# Patient Record
Sex: Male | Born: 1945 | Race: Black or African American | Hispanic: No | Marital: Single | Smoking: Former smoker
Health system: Southern US, Community
[De-identification: ages and names within clinical notes are randomized; demographics above are authoritative.]

## PROBLEM LIST (undated history)

## (undated) DIAGNOSIS — I255 Ischemic cardiomyopathy: Secondary | ICD-10-CM

## (undated) DIAGNOSIS — F1010 Alcohol abuse, uncomplicated: Secondary | ICD-10-CM

## (undated) DIAGNOSIS — I5021 Acute systolic (congestive) heart failure: Secondary | ICD-10-CM

## (undated) DIAGNOSIS — I10 Essential (primary) hypertension: Secondary | ICD-10-CM

## (undated) DIAGNOSIS — A419 Sepsis, unspecified organism: Secondary | ICD-10-CM

## (undated) DIAGNOSIS — Z87891 Personal history of nicotine dependence: Secondary | ICD-10-CM

## (undated) DIAGNOSIS — L03119 Cellulitis of unspecified part of limb: Secondary | ICD-10-CM

## (undated) DIAGNOSIS — I714 Abdominal aortic aneurysm, without rupture: Secondary | ICD-10-CM

## (undated) DIAGNOSIS — M869 Osteomyelitis, unspecified: Secondary | ICD-10-CM

## (undated) DIAGNOSIS — I70269 Atherosclerosis of native arteries of extremities with gangrene, unspecified extremity: Secondary | ICD-10-CM

## (undated) DIAGNOSIS — I509 Heart failure, unspecified: Secondary | ICD-10-CM

## (undated) DIAGNOSIS — I739 Peripheral vascular disease, unspecified: Secondary | ICD-10-CM

## (undated) DIAGNOSIS — R6521 Severe sepsis with septic shock: Secondary | ICD-10-CM

## (undated) HISTORY — DX: Peripheral vascular disease, unspecified: I73.9

## (undated) HISTORY — PX: ABOVE KNEE LEG AMPUTATION: SUR20

---

## 2013-03-25 ENCOUNTER — Emergency Department (HOSPITAL_COMMUNITY): Payer: Medicare Other

## 2013-03-25 ENCOUNTER — Encounter (HOSPITAL_COMMUNITY): Payer: Self-pay | Admitting: Emergency Medicine

## 2013-03-25 ENCOUNTER — Inpatient Hospital Stay (HOSPITAL_COMMUNITY): Payer: Medicare Other

## 2013-03-25 ENCOUNTER — Inpatient Hospital Stay (HOSPITAL_COMMUNITY)
Admission: EM | Admit: 2013-03-25 | Discharge: 2013-04-07 | DRG: 853 | Disposition: A | Discharge status: Skilled Nursing Facility | Payer: Medicare Other | Attending: Internal Medicine | Admitting: Internal Medicine

## 2013-03-25 DIAGNOSIS — Z87828 Personal history of other (healed) physical injury and trauma: Secondary | ICD-10-CM

## 2013-03-25 DIAGNOSIS — R710 Precipitous drop in hematocrit: Secondary | ICD-10-CM | POA: Diagnosis present

## 2013-03-25 DIAGNOSIS — F1210 Cannabis abuse, uncomplicated: Secondary | ICD-10-CM | POA: Diagnosis present

## 2013-03-25 DIAGNOSIS — I5021 Acute systolic (congestive) heart failure: Secondary | ICD-10-CM

## 2013-03-25 DIAGNOSIS — R195 Other fecal abnormalities: Secondary | ICD-10-CM

## 2013-03-25 DIAGNOSIS — S91309A Unspecified open wound, unspecified foot, initial encounter: Secondary | ICD-10-CM | POA: Diagnosis present

## 2013-03-25 DIAGNOSIS — Z87891 Personal history of nicotine dependence: Secondary | ICD-10-CM

## 2013-03-25 DIAGNOSIS — I998 Other disorder of circulatory system: Secondary | ICD-10-CM | POA: Diagnosis present

## 2013-03-25 DIAGNOSIS — E876 Hypokalemia: Secondary | ICD-10-CM | POA: Diagnosis present

## 2013-03-25 DIAGNOSIS — R0602 Shortness of breath: Secondary | ICD-10-CM

## 2013-03-25 DIAGNOSIS — I428 Other cardiomyopathies: Secondary | ICD-10-CM | POA: Diagnosis present

## 2013-03-25 DIAGNOSIS — F1020 Alcohol dependence, uncomplicated: Secondary | ICD-10-CM | POA: Diagnosis present

## 2013-03-25 DIAGNOSIS — S78119A Complete traumatic amputation at level between unspecified hip and knee, initial encounter: Secondary | ICD-10-CM

## 2013-03-25 DIAGNOSIS — R7881 Bacteremia: Secondary | ICD-10-CM

## 2013-03-25 DIAGNOSIS — J9601 Acute respiratory failure with hypoxia: Secondary | ICD-10-CM

## 2013-03-25 DIAGNOSIS — I509 Heart failure, unspecified: Secondary | ICD-10-CM

## 2013-03-25 DIAGNOSIS — I079 Rheumatic tricuspid valve disease, unspecified: Secondary | ICD-10-CM | POA: Diagnosis present

## 2013-03-25 DIAGNOSIS — S91301A Unspecified open wound, right foot, initial encounter: Secondary | ICD-10-CM

## 2013-03-25 DIAGNOSIS — Z7982 Long term (current) use of aspirin: Secondary | ICD-10-CM

## 2013-03-25 DIAGNOSIS — E43 Unspecified severe protein-calorie malnutrition: Secondary | ICD-10-CM

## 2013-03-25 DIAGNOSIS — R339 Retention of urine, unspecified: Secondary | ICD-10-CM | POA: Diagnosis present

## 2013-03-25 DIAGNOSIS — I10 Essential (primary) hypertension: Secondary | ICD-10-CM

## 2013-03-25 DIAGNOSIS — J9600 Acute respiratory failure, unspecified whether with hypoxia or hypercapnia: Secondary | ICD-10-CM | POA: Diagnosis present

## 2013-03-25 DIAGNOSIS — F1010 Alcohol abuse, uncomplicated: Secondary | ICD-10-CM

## 2013-03-25 DIAGNOSIS — I96 Gangrene, not elsewhere classified: Secondary | ICD-10-CM | POA: Diagnosis present

## 2013-03-25 DIAGNOSIS — R600 Localized edema: Secondary | ICD-10-CM

## 2013-03-25 DIAGNOSIS — I2510 Atherosclerotic heart disease of native coronary artery without angina pectoris: Secondary | ICD-10-CM

## 2013-03-25 DIAGNOSIS — D62 Acute posthemorrhagic anemia: Secondary | ICD-10-CM | POA: Diagnosis not present

## 2013-03-25 DIAGNOSIS — A419 Sepsis, unspecified organism: Principal | ICD-10-CM

## 2013-03-25 DIAGNOSIS — F10231 Alcohol dependence with withdrawal delirium: Secondary | ICD-10-CM | POA: Diagnosis present

## 2013-03-25 DIAGNOSIS — I080 Rheumatic disorders of both mitral and aortic valves: Secondary | ICD-10-CM | POA: Diagnosis present

## 2013-03-25 DIAGNOSIS — D696 Thrombocytopenia, unspecified: Secondary | ICD-10-CM | POA: Diagnosis present

## 2013-03-25 DIAGNOSIS — I739 Peripheral vascular disease, unspecified: Secondary | ICD-10-CM | POA: Diagnosis present

## 2013-03-25 DIAGNOSIS — X58XXXA Exposure to other specified factors, initial encounter: Secondary | ICD-10-CM | POA: Diagnosis present

## 2013-03-25 DIAGNOSIS — F1021 Alcohol dependence, in remission: Secondary | ICD-10-CM

## 2013-03-25 DIAGNOSIS — F10931 Alcohol use, unspecified with withdrawal delirium: Secondary | ICD-10-CM

## 2013-03-25 DIAGNOSIS — F17200 Nicotine dependence, unspecified, uncomplicated: Secondary | ICD-10-CM | POA: Diagnosis present

## 2013-03-25 HISTORY — DX: Personal history of nicotine dependence: Z87.891

## 2013-03-25 HISTORY — DX: Essential (primary) hypertension: I10

## 2013-03-25 HISTORY — DX: Alcohol abuse, uncomplicated: F10.10

## 2013-03-25 HISTORY — DX: Heart failure, unspecified: I50.9

## 2013-03-25 LAB — BLOOD GAS, ARTERIAL
Acid-base deficit: 5.4 mmol/L — ABNORMAL HIGH (ref 0.0–2.0)
Bicarbonate: 17.8 mEq/L — ABNORMAL LOW (ref 20.0–24.0)
Expiratory PAP: 6
O2 Saturation: 99.6 %
pH, Arterial: 7.455 — ABNORMAL HIGH (ref 7.350–7.450)
pO2, Arterial: 359 mmHg — ABNORMAL HIGH (ref 80.0–100.0)

## 2013-03-25 LAB — CBC WITH DIFFERENTIAL/PLATELET
Basophils Absolute: 0 10*3/uL (ref 0.0–0.1)
Basophils Relative: 0 % (ref 0–1)
Eosinophils Absolute: 0 10*3/uL (ref 0.0–0.7)
Lymphocytes Relative: 10 % — ABNORMAL LOW (ref 12–46)
MCH: 29.8 pg (ref 26.0–34.0)
MCHC: 33.9 g/dL (ref 30.0–36.0)
Neutro Abs: 8.1 10*3/uL — ABNORMAL HIGH (ref 1.7–7.7)
Neutrophils Relative %: 85 % — ABNORMAL HIGH (ref 43–77)
Platelets: 157 10*3/uL (ref 150–400)
RBC: 5.03 MIL/uL (ref 4.22–5.81)
RDW: 17.9 % — ABNORMAL HIGH (ref 11.5–15.5)

## 2013-03-25 LAB — COMPREHENSIVE METABOLIC PANEL
AST: 52 U/L — ABNORMAL HIGH (ref 0–37)
Albumin: 3 g/dL — ABNORMAL LOW (ref 3.5–5.2)
Alkaline Phosphatase: 162 U/L — ABNORMAL HIGH (ref 39–117)
BUN: 18 mg/dL (ref 6–23)
Chloride: 100 mEq/L (ref 96–112)
Creatinine, Ser: 0.64 mg/dL (ref 0.50–1.35)
Potassium: 3.9 mEq/L (ref 3.5–5.1)
Sodium: 140 mEq/L (ref 135–145)
Total Bilirubin: 2.2 mg/dL — ABNORMAL HIGH (ref 0.3–1.2)
Total Protein: 8.1 g/dL (ref 6.0–8.3)

## 2013-03-25 LAB — TROPONIN I
Troponin I: <0.3 ng/mL (ref <0.30)
Troponin I: <0.3 ng/mL (ref <0.30)
Troponin I: <0.3 ng/mL (ref <0.30)

## 2013-03-25 LAB — PRO B NATRIURETIC PEPTIDE: Pro B Natriuretic peptide (BNP): 14339 pg/mL — ABNORMAL HIGH (ref 0–125)

## 2013-03-25 MED ORDER — CARVEDILOL 3.125 MG PO TABS
3.1250 mg | ORAL_TABLET | Freq: Two times a day (BID) | ORAL | Status: DC
  Filled 2013-03-25: qty 1

## 2013-03-25 MED ORDER — VITAMIN B-1 100 MG PO TABS
100.0000 mg | ORAL_TABLET | Freq: Every day | ORAL | Status: DC
  Administered 2013-03-25: 100 mg via ORAL
  Filled 2013-03-25: qty 1

## 2013-03-25 MED ORDER — SODIUM CHLORIDE 0.9 % IJ SOLN: 3.0000 mL | INTRAMUSCULAR | Status: DC | PRN

## 2013-03-25 MED ORDER — VANCOMYCIN HCL IN DEXTROSE 1-5 GM/200ML-% IV SOLN
1000.0000 mg | Freq: Two times a day (BID) | INTRAVENOUS | Status: DC
  Administered 2013-03-25 – 2013-03-26 (×2): 1000 mg via INTRAVENOUS
  Filled 2013-03-25 (×4): qty 200

## 2013-03-25 MED ORDER — PIPERACILLIN-TAZOBACTAM 3.375 G IVPB
INTRAVENOUS | Status: AC
  Filled 2013-03-25: qty 50

## 2013-03-25 MED ORDER — HYDROCODONE-ACETAMINOPHEN 5-325 MG PO TABS: 1.0000 | ORAL_TABLET | ORAL | Status: DC | PRN

## 2013-03-25 MED ORDER — SODIUM CHLORIDE 0.9 % IJ SOLN
3.0000 mL | Freq: Two times a day (BID) | INTRAMUSCULAR | Status: DC
  Administered 2013-03-27 – 2013-03-29 (×2): 3 mL via INTRAVENOUS
  Administered 2013-03-29: 10:00:00 via INTRAVENOUS

## 2013-03-25 MED ORDER — ACETAMINOPHEN 650 MG RE SUPP: 650.0000 mg | Freq: Four times a day (QID) | RECTAL | Status: DC | PRN

## 2013-03-25 MED ORDER — ASPIRIN 81 MG PO CHEW
81.0000 mg | CHEWABLE_TABLET | Freq: Every day | ORAL | Status: DC
  Administered 2013-03-25 – 2013-04-07 (×14): 81 mg via ORAL
  Filled 2013-03-25 (×13): qty 1

## 2013-03-25 MED ORDER — TAMSULOSIN HCL 0.4 MG PO CAPS
0.4000 mg | ORAL_CAPSULE | Freq: Every day | ORAL | Status: DC
  Administered 2013-03-25: 0.4 mg via ORAL
  Filled 2013-03-25: qty 1

## 2013-03-25 MED ORDER — DOXYCYCLINE HYCLATE 100 MG IV SOLR
100.0000 mg | Freq: Two times a day (BID) | INTRAVENOUS | Status: DC
  Administered 2013-03-25 – 2013-03-26 (×2): 100 mg via INTRAVENOUS
  Filled 2013-03-25 (×4): qty 100

## 2013-03-25 MED ORDER — VANCOMYCIN HCL IN DEXTROSE 1-5 GM/200ML-% IV SOLN
INTRAVENOUS | Status: AC
  Filled 2013-03-25: qty 200

## 2013-03-25 MED ORDER — ENOXAPARIN SODIUM 40 MG/0.4ML ~~LOC~~ SOLN
40.0000 mg | SUBCUTANEOUS | Status: DC
  Filled 2013-03-25: qty 0.4

## 2013-03-25 MED ORDER — FUROSEMIDE 10 MG/ML IJ SOLN: 40.0000 mg | Freq: Two times a day (BID) | INTRAMUSCULAR | Status: DC

## 2013-03-25 MED ORDER — ONDANSETRON HCL 4 MG PO TABS: 4.0000 mg | ORAL_TABLET | Freq: Four times a day (QID) | ORAL | Status: DC | PRN

## 2013-03-25 MED ORDER — SODIUM CHLORIDE 0.9 % IJ SOLN
3.0000 mL | Freq: Two times a day (BID) | INTRAMUSCULAR | Status: DC
  Administered 2013-03-28 – 2013-04-06 (×4): 3 mL via INTRAVENOUS

## 2013-03-25 MED ORDER — THIAMINE HCL 100 MG/ML IJ SOLN
100.0000 mg | Freq: Every day | INTRAMUSCULAR | Status: DC
  Administered 2013-03-26 – 2013-03-27 (×2): 100 mg via INTRAVENOUS
  Filled 2013-03-25 (×3): qty 1

## 2013-03-25 MED ORDER — FUROSEMIDE 10 MG/ML IJ SOLN
40.0000 mg | Freq: Once | INTRAMUSCULAR | Status: AC
  Administered 2013-03-25: 40 mg via INTRAVENOUS
  Filled 2013-03-25: qty 4

## 2013-03-25 MED ORDER — SODIUM CHLORIDE 0.9 % IV BOLUS (SEPSIS)
250.0000 mL | Freq: Once | INTRAVENOUS | Status: AC
  Administered 2013-03-25: 250 mL via INTRAVENOUS

## 2013-03-25 MED ORDER — PIPERACILLIN-TAZOBACTAM 3.375 G IVPB
3.3750 g | Freq: Three times a day (TID) | INTRAVENOUS | Status: DC
  Administered 2013-03-26 – 2013-03-31 (×17): 3.375 g via INTRAVENOUS
  Filled 2013-03-25 (×20): qty 50

## 2013-03-25 MED ORDER — ALUM & MAG HYDROXIDE-SIMETH 200-200-20 MG/5ML PO SUSP: 30.0000 mL | Freq: Four times a day (QID) | ORAL | Status: DC | PRN

## 2013-03-25 MED ORDER — FUROSEMIDE 10 MG/ML IJ SOLN
40.0000 mg | Freq: Two times a day (BID) | INTRAMUSCULAR | Status: DC
  Filled 2013-03-25: qty 4

## 2013-03-25 MED ORDER — HYDROMORPHONE HCL PF 1 MG/ML IJ SOLN: 0.5000 mg | INTRAMUSCULAR | Status: DC | PRN

## 2013-03-25 MED ORDER — FOLIC ACID 1 MG PO TABS
1.0000 mg | ORAL_TABLET | Freq: Every day | ORAL | Status: DC
  Administered 2013-03-25 – 2013-04-07 (×14): 1 mg via ORAL
  Filled 2013-03-25 (×15): qty 1

## 2013-03-25 MED ORDER — LORAZEPAM 0.5 MG PO TABS: 1.0000 mg | ORAL_TABLET | Freq: Four times a day (QID) | ORAL | Status: DC | PRN

## 2013-03-25 MED ORDER — LORAZEPAM 2 MG/ML IJ SOLN: 1.0000 mg | Freq: Four times a day (QID) | INTRAMUSCULAR | Status: DC | PRN

## 2013-03-25 MED ORDER — ADULT MULTIVITAMIN W/MINERALS CH
1.0000 | ORAL_TABLET | Freq: Every day | ORAL | Status: DC
  Administered 2013-03-25: 1 via ORAL
  Filled 2013-03-25 (×2): qty 1

## 2013-03-25 MED ORDER — SODIUM CHLORIDE 0.9 % IV SOLN: 250.0000 mL | INTRAVENOUS | Status: DC | PRN

## 2013-03-25 MED ORDER — CARVEDILOL 6.25 MG PO TABS
6.2500 mg | ORAL_TABLET | Freq: Two times a day (BID) | ORAL | Status: DC
  Administered 2013-03-25: 6.25 mg via ORAL
  Filled 2013-03-25 (×2): qty 1
  Filled 2013-03-25: qty 2
  Filled 2013-03-25: qty 1

## 2013-03-25 MED ORDER — CHLORDIAZEPOXIDE HCL 5 MG PO CAPS
10.0000 mg | ORAL_CAPSULE | Freq: Three times a day (TID) | ORAL | Status: DC
  Administered 2013-03-25 – 2013-03-31 (×18): 10 mg via ORAL
  Filled 2013-03-25 (×18): qty 2

## 2013-03-25 MED ORDER — ACETAMINOPHEN 325 MG PO TABS: 650.0000 mg | ORAL_TABLET | Freq: Four times a day (QID) | ORAL | Status: DC | PRN

## 2013-03-25 MED ORDER — ONDANSETRON HCL 4 MG/2ML IJ SOLN: 4.0000 mg | Freq: Four times a day (QID) | INTRAMUSCULAR | Status: DC | PRN

## 2013-03-25 MED ORDER — NITROGLYCERIN 2 % TD OINT
0.5000 [in_us] | TOPICAL_OINTMENT | Freq: Four times a day (QID) | TRANSDERMAL | Status: DC
  Administered 2013-03-25: 0.5 [in_us] via TOPICAL
  Filled 2013-03-25: qty 1

## 2013-03-25 NOTE — ED Notes (Signed)
Pt smells of kerosine, family and friends brought pt here and states the pt stays drunk most of the time, has had increasing difficulty ambulating and has co SOB without chest pain. Pt states he is fine and does not want to be in hospital.

## 2013-03-25 NOTE — Progress Notes (Addendum)
Called by nurse to evaluate the patient for lethargy, hypotension and worsening shortness of breath. Patient was admitted earlier today with acute on chronic nonspecific CHF, 2-D echo is pending. Patient was started on IV Lasix, nitro paste 15 mg every 6 hours, and also started on Flomax for possible BPH. On exam- patient is lethargic, though arousable and able to answer questions appropriately. Patient cooperative with exam Lungs- bilateral crackles Foot- ischemic foot with gangrene  Assessment/plan  Hypotension-patient's blood pressure was found to be 82/48 manually,  patient could be developing sepsis from the ischemic foot with gangrene, will start him on vancomycin and Zosyn. We'll obtain stat CBC, lactic acid. I will discontinue Nitropaste, Flomax at this time. Will hold the other antihypertensives for systolic blood pressure less than 90.  We'll give him a bolus of 250 normal saline, as he is in CHF exacerbation. We'll transfer the patient to step down and monitor closely. If blood pressure doesn't improve we'll have to start him on pressors support. We'll also obtain EKG, cardiac enzymes time 3.  Acute CHF exacerbation - at this time due to low blood pressure , we'll not give Lasix . Patient will be transferred to step down and started on BiPAP.

## 2013-03-25 NOTE — Progress Notes (Signed)
Patient's ABG results were good. Hand delivered results to Dr. Sharl Ma. Patient taken off BIPAP for now and placed on 2 lpm nasal cannula.

## 2013-03-25 NOTE — ED Notes (Signed)
Pt unable to stand for DG 2 view x-ray.

## 2013-03-25 NOTE — ED Notes (Signed)
Pt reports being sob for the last month, worse in the last week, unable to walk at times. Denies any fever or cp, no cough.

## 2013-03-25 NOTE — ED Provider Notes (Signed)
CSN: [id number]     Arrival date & time 03/25/13  1134 History   This chart was scribed for Benny Lennert, MD, by Yevette Edwards, ED Scribe. This patient was seen in room APA02/APA02 and the patient's care was started at 12:22 PM.  None    Chief Complaint  Patient presents with  . Shortness of Breath    Patient is a 67 y.o. male presenting with shortness of breath. The history is provided by a relative and the patient. No language interpreter was used.  Shortness of Breath Onset quality:  Gradual Duration:  4 weeks Associated symptoms: no chest pain, no cough and no fever   Risk factors: alcohol use and tobacco use    HPI Comments: Rodney [name] is a 67 y.o. male, with a h/o alcohol abuse, who presents to the Emergency Department complaining of SOB which has been gradually-worsening for approximately a month. He reports ambulation increases his SOB.  His cousin reports the pt has experienced increased fatigue, increased generalized weakness, and increased swelling to his lower extremities and his face which has also been present for a month. The pt denies any fever, chest pain, or cough. The pt usually drinks liquor and beer on a daily basis, and he reports a h/o minimal medical care. He is a current smoker.   History reviewed. No pertinent past medical history. History reviewed. No pertinent past surgical history. No family history on file. History  Substance Use Topics  . Smoking status: Current Every Day Smoker    Types: Cigarettes  . Smokeless tobacco: Not on file  . Alcohol Use: Yes     Comment: everyday    Review of Systems  Constitutional: Positive for fatigue. Negative for fever.  Respiratory: Positive for shortness of breath. Negative for cough.   Cardiovascular: Positive for leg swelling. Negative for chest pain.  Neurological: Positive for weakness.  All other systems reviewed and are negative.   Allergies  Review of patient's allergies indicates not on  file.  Home Medications  No current outpatient prescriptions on file.  Triage Vitals: BP 149/68  Pulse 85  Temp(Src) 98.3 F (36.8 C) (Oral)  Resp 18  Ht 5\' 8"  (1.727 m)  Wt 180 lb (81.647 kg)  BMI 27.38 kg/m2  SpO2 100%  Physical Exam  Nursing note and vitals reviewed. Constitutional: He is oriented to person, place, and time. He appears well-developed and well-nourished. No distress.  HENT:  Head: Normocephalic and atraumatic.  Edema present.   Eyes: Conjunctivae and EOM are normal. No scleral icterus.  Neck: Neck supple. No tracheal deviation present. No thyromegaly present.  Cardiovascular: Normal rate, regular rhythm and intact distal pulses.  Exam reveals no gallop and no friction rub.   No murmur heard. Pulmonary/Chest: Effort normal. No stridor. No respiratory distress. He has no wheezes. He has no rales. He exhibits no tenderness.  Mild crackles in bases bilaterally.   Abdominal: He exhibits no distension. There is no tenderness. There is no rebound.  Musculoskeletal: He exhibits edema.  2+ pitting edema. Edema present to face.   Lymphadenopathy:    He has no cervical adenopathy.  Neurological: He is alert and oriented to person, place, and time. He exhibits normal muscle tone. Coordination normal.  Skin: Skin is warm and dry. No rash noted. No erythema.  Psychiatric: He has a normal mood and affect. His behavior is normal.    ED Course  Procedures (including critical care time)  DIAGNOSTIC STUDIES: Oxygen Saturation is 100% on  room air, normal by my interpretation.    COORDINATION OF CARE:  12:28 PM- Discussed treatment plan with patient, and the patient agreed to the plan.   Labs Review Labs Reviewed  CBC WITH DIFFERENTIAL - Abnormal; Notable for the following:    RDW 17.9 (*)    Neutrophils Relative % 85 (*)    Neutro Abs 8.1 (*)    Lymphocytes Relative 10 (*)    All other components within normal limits  COMPREHENSIVE METABOLIC PANEL - Abnormal;  Notable for the following:    Glucose, Bld 119 (*)    Albumin 3.0 (*)    AST 52 (*)    Alkaline Phosphatase 162 (*)    Total Bilirubin 2.2 (*)    All other components within normal limits  PRO B NATRIURETIC PEPTIDE - Abnormal; Notable for the following:    Pro B Natriuretic peptide (BNP) 14339.0 (*)    All other components within normal limits  TROPONIN I   Imaging Review Dg Chest 2 View  03/25/2013   CLINICAL DATA:  Weakness, lower extremity edema, and dyspnea  EXAM: CHEST  2 VIEW  COMPARISON:  None.  FINDINGS: The lungs are adequately inflated. The cardiopericardial silhouette is enlarged. There is a small left pleural effusion blunting the costophrenic angles. The pulmonary vascularity is mildly prominent centrally. Definite cephalization is not demonstrated. The mediastinum is normal in width. The trachea is midline.  IMPRESSION: The findings are consistent with low grade CHF with small left-sided pleural effusion and mild central pulmonary vascular congestion.   Electronically Signed   By: David  [place]   On: 03/25/2013 13:15    EKG Interpretation   None       MDM  chf  The chart was scribed for me under my direct supervision.  I personally performed the history, physical, and medical decision making and all procedures in the evaluation of this patient.Benny Lennert, MD 03/25/13 [phone]
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
## 2013-03-26 ENCOUNTER — Encounter (HOSPITAL_COMMUNITY): Payer: Self-pay | Admitting: Anesthesiology

## 2013-03-26 ENCOUNTER — Inpatient Hospital Stay (HOSPITAL_COMMUNITY): Payer: Medicare Other

## 2013-03-26 ENCOUNTER — Encounter (HOSPITAL_COMMUNITY)
Admission: EM | Disposition: A | Discharge status: Skilled Nursing Facility | Payer: Self-pay | Source: Home / Self Care | Attending: Critical Care Medicine

## 2013-03-26 ENCOUNTER — Encounter (HOSPITAL_COMMUNITY): Payer: Medicare Other | Admitting: Anesthesiology

## 2013-03-26 ENCOUNTER — Inpatient Hospital Stay (HOSPITAL_COMMUNITY): Payer: Medicare Other | Admitting: Anesthesiology

## 2013-03-26 DIAGNOSIS — E43 Unspecified severe protein-calorie malnutrition: Secondary | ICD-10-CM | POA: Diagnosis present

## 2013-03-26 DIAGNOSIS — A419 Sepsis, unspecified organism: Secondary | ICD-10-CM

## 2013-03-26 DIAGNOSIS — I70269 Atherosclerosis of native arteries of extremities with gangrene, unspecified extremity: Secondary | ICD-10-CM

## 2013-03-26 DIAGNOSIS — R609 Edema, unspecified: Secondary | ICD-10-CM

## 2013-03-26 HISTORY — PX: AMPUTATION: SHX166

## 2013-03-26 HISTORY — DX: Sepsis, unspecified organism: A41.9

## 2013-03-26 LAB — MRSA PCR SCREENING: MRSA by PCR: NEGATIVE

## 2013-03-26 LAB — CBC
HCT: 32.4 % — ABNORMAL LOW (ref 39.0–52.0)
Hemoglobin: 11.2 g/dL — ABNORMAL LOW (ref 13.0–17.0)
MCH: 30.6 pg (ref 26.0–34.0)
MCHC: 34.6 g/dL (ref 30.0–36.0)
MCV: 88.5 fL (ref 78.0–100.0)
RDW: 16.7 % — ABNORMAL HIGH (ref 11.5–15.5)

## 2013-03-26 LAB — BLOOD GAS, ARTERIAL
Acid-base deficit: 4.3 mmol/L — ABNORMAL HIGH (ref 0.0–2.0)
Bicarbonate: 19.4 mEq/L — ABNORMAL LOW (ref 20.0–24.0)
FIO2: 0.4 %
MECHVT: 480 mL
O2 Saturation: 98.4 %
PEEP: 5 cmH2O
Patient temperature: 37
RATE: 18 resp/min
TCO2: 17.8 mmol/L (ref 0–100)
pO2, Arterial: 132 mmHg — ABNORMAL HIGH (ref 80.0–100.0)

## 2013-03-26 LAB — CARBOXYHEMOGLOBIN
Carboxyhemoglobin: 1.3 % (ref 0.5–1.5)
Carboxyhemoglobin: 1.3 % (ref 0.5–1.5)
Carboxyhemoglobin: 1.5 % (ref 0.5–1.5)
Methemoglobin: 1.3 % (ref 0.0–1.5)
Methemoglobin: 0.6 % (ref 0.0–1.5)
Methemoglobin: 0.6 % (ref 0.0–1.5)
O2 Saturation: 63.7 %
O2 Saturation: 75.4 %
Total hemoglobin: 16.7 g/dL (ref 13.5–18.0)
Total hemoglobin: 9.8 g/dL — ABNORMAL LOW (ref 13.5–18.0)
Total hemoglobin: 10.2 g/dL — ABNORMAL LOW (ref 13.5–18.0)

## 2013-03-26 LAB — HEPATIC FUNCTION PANEL
ALT: 17 U/L (ref 0–53)
AST: 41 U/L — ABNORMAL HIGH (ref 0–37)
Albumin: 1.8 g/dL — ABNORMAL LOW (ref 3.5–5.2)
Alkaline Phosphatase: 105 U/L (ref 39–117)
Bilirubin, Direct: 1.1 mg/dL — ABNORMAL HIGH (ref 0.0–0.3)
Total Protein: 4.8 g/dL — ABNORMAL LOW (ref 6.0–8.3)

## 2013-03-26 LAB — BASIC METABOLIC PANEL
CO2: 22 mEq/L (ref 19–32)
Calcium: 7.4 mg/dL — ABNORMAL LOW (ref 8.4–10.5)
Chloride: 104 mEq/L (ref 96–112)
GFR calc non Af Amer: 66 mL/min — ABNORMAL LOW (ref >90)
Potassium: 3 mEq/L — ABNORMAL LOW (ref 3.5–5.1)
Sodium: 140 mEq/L (ref 135–145)

## 2013-03-26 LAB — LIPID PANEL
Cholesterol: 86 mg/dL (ref 0–200)
LDL Cholesterol: 38 mg/dL (ref 0–99)
Total CHOL/HDL Ratio: 2.7 RATIO
Triglycerides: 80 mg/dL (ref <150)

## 2013-03-26 LAB — POCT I-STAT 7, (LYTES, BLD GAS, ICA,H+H)
O2 Saturation: 100 %
Potassium: 3.9 mEq/L (ref 3.5–5.1)
Sodium: 140 mEq/L (ref 135–145)
TCO2: 24 mmol/L (ref 0–100)
pCO2 arterial: 36.2 mmHg (ref 35.0–45.0)

## 2013-03-26 LAB — TYPE AND SCREEN
ABO/RH(D): O POS
Antibody Screen: NEGATIVE

## 2013-03-26 LAB — GLUCOSE, CAPILLARY
Glucose-Capillary: 96 mg/dL (ref 70–99)
Glucose-Capillary: 96 mg/dL (ref 70–99)
Glucose-Capillary: 98 mg/dL (ref 70–99)

## 2013-03-26 LAB — PSA: PSA: 12.14 ng/mL — ABNORMAL HIGH (ref <=4.00)

## 2013-03-26 LAB — HEMOGLOBIN A1C: Mean Plasma Glucose: 117 mg/dL — ABNORMAL HIGH (ref <117)

## 2013-03-26 SURGERY — AMPUTATION, ABOVE KNEE
Anesthesia: General | Laterality: Right

## 2013-03-26 MED ORDER — MIDAZOLAM HCL 5 MG/5ML IJ SOLN
INTRAMUSCULAR | Status: DC | PRN
  Administered 2013-03-26: 2 mg via INTRAVENOUS

## 2013-03-26 MED ORDER — DEXTROSE 5 % IV SOLN
2.0000 ug/min | INTRAVENOUS | Status: DC
  Administered 2013-03-26: 20 ug/min via INTRAVENOUS
  Administered 2013-03-28: 15 ug/min via INTRAVENOUS
  Filled 2013-03-26 (×3): qty 16

## 2013-03-26 MED ORDER — SODIUM CHLORIDE 0.9 % IV BOLUS (SEPSIS): 1000.0000 mL | INTRAVENOUS | Status: DC | PRN

## 2013-03-26 MED ORDER — DOBUTAMINE IN D5W 4-5 MG/ML-% IV SOLN
2.5000 ug/kg/min | INTRAVENOUS | Status: DC
  Administered 2013-03-26: 2.5 ug/kg/min via INTRAVENOUS
  Administered 2013-03-27: 5 ug/kg/min via INTRAVENOUS
  Filled 2013-03-26 (×4): qty 250

## 2013-03-26 MED ORDER — MIDAZOLAM HCL 2 MG/2ML IJ SOLN
1.0000 mg | INTRAMUSCULAR | Status: DC | PRN
  Administered 2013-03-26: 1 mg via INTRAVENOUS
  Administered 2013-03-26 – 2013-03-27 (×4): 2 mg via INTRAVENOUS
  Filled 2013-03-26 (×4): qty 2

## 2013-03-26 MED ORDER — ROCURONIUM BROMIDE 50 MG/5ML IV SOLN
INTRAVENOUS | Status: AC
  Filled 2013-03-26: qty 2

## 2013-03-26 MED ORDER — SODIUM CHLORIDE 0.9 % IV SOLN
250.0000 mL | INTRAVENOUS | Status: DC | PRN
  Administered 2013-03-29: 01:00:00 via INTRAVENOUS

## 2013-03-26 MED ORDER — MORPHINE SULFATE 2 MG/ML IJ SOLN
2.0000 mg | INTRAMUSCULAR | Status: DC | PRN
  Administered 2013-03-26: 4 mg via INTRAVENOUS
  Filled 2013-03-26: qty 2

## 2013-03-26 MED ORDER — 0.9 % SODIUM CHLORIDE (POUR BTL) OPTIME
TOPICAL | Status: DC | PRN
  Administered 2013-03-26: 1000 mL

## 2013-03-26 MED ORDER — FENTANYL CITRATE 0.05 MG/ML IJ SOLN
INTRAMUSCULAR | Status: DC | PRN
  Administered 2013-03-26: 100 ug via INTRAVENOUS

## 2013-03-26 MED ORDER — NOREPINEPHRINE BITARTRATE 1 MG/ML IJ SOLN
2.0000 ug/min | INTRAVENOUS | Status: DC
  Filled 2013-03-26: qty 4

## 2013-03-26 MED ORDER — LIDOCAINE HCL (CARDIAC) 20 MG/ML IV SOLN
INTRAVENOUS | Status: AC
  Filled 2013-03-26: qty 5

## 2013-03-26 MED ORDER — PANTOPRAZOLE SODIUM 40 MG IV SOLR
40.0000 mg | Freq: Every day | INTRAVENOUS | Status: DC
  Administered 2013-03-26: 40 mg via INTRAVENOUS
  Filled 2013-03-26 (×2): qty 40

## 2013-03-26 MED ORDER — BIOTENE DRY MOUTH MT LIQD
15.0000 mL | Freq: Four times a day (QID) | OROMUCOSAL | Status: DC
  Administered 2013-03-26 – 2013-03-29 (×14): 15 mL via OROMUCOSAL

## 2013-03-26 MED ORDER — OXYCODONE HCL 5 MG PO TABS
5.0000 mg | ORAL_TABLET | ORAL | Status: DC | PRN
  Administered 2013-03-30 – 2013-04-01 (×3): 5 mg via ORAL
  Filled 2013-03-26: qty 2
  Filled 2013-03-26: qty 1
  Filled 2013-03-26 (×2): qty 2
  Filled 2013-03-26 (×2): qty 1

## 2013-03-26 MED ORDER — SODIUM CHLORIDE 0.9 % IV BOLUS (SEPSIS)
500.0000 mL | Freq: Once | INTRAVENOUS | Status: AC
  Administered 2013-03-26: 500 mL via INTRAVENOUS

## 2013-03-26 MED ORDER — SUCCINYLCHOLINE CHLORIDE 20 MG/ML IJ SOLN
INTRAMUSCULAR | Status: AC
  Administered 2013-03-26: 50 mg
  Filled 2013-03-26: qty 1

## 2013-03-26 MED ORDER — NOREPINEPHRINE BITARTRATE 1 MG/ML IJ SOLN
INTRAMUSCULAR | Status: AC
  Filled 2013-03-26: qty 4

## 2013-03-26 MED ORDER — DEXTROSE 5 % IV SOLN
30.0000 ug/min | INTRAVENOUS | Status: DC
  Administered 2013-03-26: 160 ug/min via INTRAVENOUS
  Filled 2013-03-26: qty 1

## 2013-03-26 MED ORDER — CIPROFLOXACIN IN D5W 400 MG/200ML IV SOLN
400.0000 mg | Freq: Two times a day (BID) | INTRAVENOUS | Status: DC
  Administered 2013-03-26 – 2013-03-28 (×4): 400 mg via INTRAVENOUS
  Filled 2013-03-26 (×5): qty 200

## 2013-03-26 MED ORDER — INSULIN ASPART 100 UNIT/ML ~~LOC~~ SOLN
0.0000 [IU] | SUBCUTANEOUS | Status: DC
  Administered 2013-03-28 (×2): 2 [IU] via SUBCUTANEOUS
  Administered 2013-03-29: 3 [IU] via SUBCUTANEOUS
  Administered 2013-03-29 – 2013-03-30 (×4): 2 [IU] via SUBCUTANEOUS
  Administered 2013-03-30: 3 [IU] via SUBCUTANEOUS

## 2013-03-26 MED ORDER — ARTIFICIAL TEARS OP OINT
TOPICAL_OINTMENT | OPHTHALMIC | Status: DC | PRN
  Administered 2013-03-26: 1 via OPHTHALMIC

## 2013-03-26 MED ORDER — CHLORHEXIDINE GLUCONATE 0.12 % MT SOLN
15.0000 mL | Freq: Two times a day (BID) | OROMUCOSAL | Status: DC
  Administered 2013-03-26 – 2013-03-29 (×7): 15 mL via OROMUCOSAL
  Filled 2013-03-26 (×8): qty 15

## 2013-03-26 MED ORDER — MIDAZOLAM HCL 2 MG/2ML IJ SOLN
1.0000 mg | Freq: Once | INTRAMUSCULAR | Status: AC
  Administered 2013-03-26: 1 mg via INTRAVENOUS

## 2013-03-26 MED ORDER — FENTANYL CITRATE 0.05 MG/ML IJ SOLN
50.0000 ug | INTRAMUSCULAR | Status: DC | PRN
  Administered 2013-03-26 – 2013-03-27 (×8): 50 ug via INTRAVENOUS
  Filled 2013-03-26 (×6): qty 2

## 2013-03-26 MED ORDER — HEPARIN SODIUM (PORCINE) 5000 UNIT/ML IJ SOLN
5000.0000 [IU] | Freq: Three times a day (TID) | INTRAMUSCULAR | Status: DC
  Administered 2013-03-26 – 2013-04-07 (×35): 5000 [IU] via SUBCUTANEOUS
  Filled 2013-03-26 (×40): qty 1

## 2013-03-26 MED ORDER — MIDAZOLAM HCL 2 MG/2ML IJ SOLN
INTRAMUSCULAR | Status: AC
  Filled 2013-03-26: qty 2

## 2013-03-26 MED ORDER — PHENYLEPHRINE HCL 10 MG/ML IJ SOLN
INTRAMUSCULAR | Status: AC
  Filled 2013-03-26: qty 1

## 2013-03-26 MED ORDER — NALOXONE HCL 0.4 MG/ML IJ SOLN
INTRAMUSCULAR | Status: AC
  Filled 2013-03-26: qty 1

## 2013-03-26 MED ORDER — ROCURONIUM BROMIDE 100 MG/10ML IV SOLN
INTRAVENOUS | Status: DC | PRN
  Administered 2013-03-26: 30 mg via INTRAVENOUS

## 2013-03-26 MED ORDER — SODIUM CHLORIDE 0.9 % IJ SOLN
10.0000 mL | Freq: Two times a day (BID) | INTRAMUSCULAR | Status: DC
  Administered 2013-03-26 – 2013-03-28 (×6): 10 mL
  Administered 2013-03-29: 40 mL
  Administered 2013-03-29 – 2013-03-30 (×3): 10 mL
  Administered 2013-03-31: 20 mL

## 2013-03-26 MED ORDER — ETOMIDATE 2 MG/ML IV SOLN
INTRAVENOUS | Status: AC
  Filled 2013-03-26: qty 20

## 2013-03-26 MED ORDER — NOREPINEPHRINE BITARTRATE 1 MG/ML IJ SOLN
2.0000 ug/min | INTRAVENOUS | Status: DC
  Administered 2013-03-26: 2 ug/min via INTRAVENOUS
  Filled 2013-03-26: qty 4

## 2013-03-26 MED ORDER — POTASSIUM CHLORIDE 10 MEQ/50ML IV SOLN
10.0000 meq | INTRAVENOUS | Status: AC
  Administered 2013-03-26 (×2): 10 meq via INTRAVENOUS
  Filled 2013-03-26 (×4): qty 50

## 2013-03-26 MED ORDER — SODIUM CHLORIDE 0.9 % IJ SOLN
10.0000 mL | INTRAMUSCULAR | Status: DC | PRN
  Administered 2013-04-02 – 2013-04-03 (×4): 10 mL
  Administered 2013-04-03: 15:00:00 30 mL
  Administered 2013-04-03: 04:00:00 10 mL
  Administered 2013-04-05 – 2013-04-06 (×2): 30 mL
  Administered 2013-04-06: 04:00:00 10 mL
  Administered 2013-04-06: 30 mL

## 2013-03-26 MED ORDER — PHENYLEPHRINE HCL 10 MG/ML IJ SOLN
20.0000 mg | INTRAMUSCULAR | Status: DC | PRN
  Administered 2013-03-26: 25 ug/min via INTRAVENOUS

## 2013-03-26 MED ORDER — FENTANYL CITRATE 0.05 MG/ML IJ SOLN
50.0000 ug | INTRAMUSCULAR | Status: AC | PRN
  Administered 2013-03-26 (×3): 50 ug via INTRAVENOUS
  Filled 2013-03-26 (×3): qty 2

## 2013-03-26 MED ORDER — LACTATED RINGERS IV SOLN
INTRAVENOUS | Status: DC | PRN
  Administered 2013-03-26: 10:00:00 via INTRAVENOUS

## 2013-03-26 MED ORDER — NALOXONE HCL 0.4 MG/ML IJ SOLN
0.4000 mg | INTRAMUSCULAR | Status: DC | PRN
  Administered 2013-03-26: 0.4 mg via INTRAVENOUS

## 2013-03-26 MED ORDER — ONDANSETRON HCL 4 MG/2ML IJ SOLN
INTRAMUSCULAR | Status: DC | PRN
  Administered 2013-03-26: 4 mg via INTRAVENOUS

## 2013-03-26 SURGICAL SUPPLY — 46 items
BANDAGE ELASTIC 4 VELCRO ST LF (GAUZE/BANDAGES/DRESSINGS) ×2 IMPLANT
BANDAGE ELASTIC 6 VELCRO ST LF (GAUZE/BANDAGES/DRESSINGS) ×2 IMPLANT
BANDAGE ESMARK 6X9 LF (GAUZE/BANDAGES/DRESSINGS) IMPLANT
BANDAGE GAUZE ELAST BULKY 4 IN (GAUZE/BANDAGES/DRESSINGS) ×2 IMPLANT
BNDG ESMARK 6X9 LF (GAUZE/BANDAGES/DRESSINGS)
CANISTER SUCTION 2500CC (MISCELLANEOUS) ×2 IMPLANT
CLIP LIGATING EXTRA MED SLVR (CLIP) ×2 IMPLANT
CLIP LIGATING EXTRA SM BLUE (MISCELLANEOUS) IMPLANT
COVER SURGICAL LIGHT HANDLE (MISCELLANEOUS) ×2 IMPLANT
CUFF TOURNIQUET SINGLE 34IN LL (TOURNIQUET CUFF) IMPLANT
CUFF TOURNIQUET SINGLE 44IN (TOURNIQUET CUFF) IMPLANT
DRAIN SNY 10X20 3/4 PERF (WOUND CARE) IMPLANT
DRAPE ORTHO SPLIT 77X108 STRL (DRAPES) ×2
DRAPE PROXIMA HALF (DRAPES) ×2 IMPLANT
DRAPE SURG ORHT 6 SPLT 77X108 (DRAPES) ×2 IMPLANT
ELECT REM PT RETURN 9FT ADLT (ELECTROSURGICAL) ×2
ELECTRODE REM PT RTRN 9FT ADLT (ELECTROSURGICAL) ×1 IMPLANT
EVACUATOR SILICONE 100CC (DRAIN) IMPLANT
GAUZE XEROFORM 5X9 LF (GAUZE/BANDAGES/DRESSINGS) ×2 IMPLANT
GLOVE BIO SURGEON STRL SZ 6.5 (GLOVE) ×4 IMPLANT
GLOVE BIOGEL PI IND STRL 8 (GLOVE) ×1 IMPLANT
GLOVE BIOGEL PI INDICATOR 8 (GLOVE) ×1
GLOVE ECLIPSE 6.5 STRL STRAW (GLOVE) ×4 IMPLANT
GLOVE SKINSENSE NS SZ7.0 (GLOVE) ×1
GLOVE SKINSENSE STRL SZ7.0 (GLOVE) ×1 IMPLANT
GLOVE SS BIOGEL STRL SZ 7.5 (GLOVE) ×1 IMPLANT
GLOVE SUPERSENSE BIOGEL SZ 7.5 (GLOVE) ×1
GOWN STRL NON-REIN LRG LVL3 (GOWN DISPOSABLE) ×6 IMPLANT
KIT BASIN OR (CUSTOM PROCEDURE TRAY) ×2 IMPLANT
KIT ROOM TURNOVER OR (KITS) ×2 IMPLANT
NS IRRIG 1000ML POUR BTL (IV SOLUTION) ×2 IMPLANT
PACK GENERAL/GYN (CUSTOM PROCEDURE TRAY) ×2 IMPLANT
PAD ARMBOARD 7.5X6 YLW CONV (MISCELLANEOUS) ×4 IMPLANT
PADDING CAST COTTON 6X4 STRL (CAST SUPPLIES) IMPLANT
SAW GIGLI STERILE 20 (MISCELLANEOUS) ×2 IMPLANT
SPONGE GAUZE 4X4 12PLY (GAUZE/BANDAGES/DRESSINGS) ×2 IMPLANT
STAPLER VISISTAT 35W (STAPLE) ×2 IMPLANT
STOCKINETTE IMPERVIOUS LG (DRAPES) ×2 IMPLANT
SUT ETHILON 3 0 PS 1 (SUTURE) IMPLANT
SUT VIC AB 0 CT1 18XCR BRD 8 (SUTURE) ×2 IMPLANT
SUT VIC AB 0 CT1 8-18 (SUTURE) ×2
SUT VICRYL AB 2 0 TIES (SUTURE) ×2 IMPLANT
TOWEL OR 17X24 6PK STRL BLUE (TOWEL DISPOSABLE) ×4 IMPLANT
TOWEL OR 17X26 10 PK STRL BLUE (TOWEL DISPOSABLE) ×2 IMPLANT
UNDERPAD 30X30 INCONTINENT (UNDERPADS AND DIAPERS) ×2 IMPLANT
WATER STERILE IRR 1000ML POUR (IV SOLUTION) ×2 IMPLANT

## 2013-03-26 NOTE — Significant Event (Signed)
Blood culture drawan @ [facility] positive for GNRs  DC vanc and doxy Cont pip-tazo  Add Cipro pending ID of organism and sensitivities   Billy Fischer, MD ; [facility] [phone].  After 5:30 PM or weekends, call [phone]

## 2013-03-26 NOTE — Procedures (Signed)
Central Venous Catheter Insertion Procedure Note Osher Oettinger [id number] [birth year]  Procedure: Insertion of Central Venous Catheter Indications: Assessment of intravascular volume, Drug and/or fluid administration and Frequent blood sampling  Procedure Details Consent: Risks of procedure as well as the alternatives and risks of each were explained to the (patient/caregiver).  Consent for procedure obtained. Time Out: Verified patient identification, verified procedure, site/side was marked, verified correct patient position, special equipment/implants available, medications/allergies/relevent history reviewed, required imaging and test results available.  Performed  Maximum sterile technique was used including antiseptics, cap, gloves, gown, hand hygiene, mask and sheet. Skin prep: Chlorhexidine; local anesthetic administered A antimicrobial bonded/coated triple lumen catheter was placed in the right subclavian vein using the Seldinger technique.  Evaluation Blood flow good Complications: No apparent complications Patient did tolerate procedure well. Chest X-ray ordered to verify placement.  CXR: pending.  Rick Warnick A 03/26/2013, 3:39 AM

## 2013-03-26 NOTE — Progress Notes (Signed)
Lab called with critical troponin of 0.70. MD made aware. Continue to monitor
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
## 2013-03-26 NOTE — Progress Notes (Signed)
A-line attempted with two unsuccessful sticks, first stick received good blood return but unable to thread catheter into the artery, second attempt only received flash of blood return. Rn notified of the attempts.

## 2013-03-26 NOTE — Anesthesia Procedure Notes (Addendum)
Date/Time: 03/26/2013 10:25 AM Performed by: Sherie Don Pre-anesthesia Checklist: Patient identified, Emergency Drugs available, Suction available, Patient being monitored and Timeout performed Patient Re-evaluated:Patient Re-evaluated prior to inductionOxygen Delivery Method: Circle system utilized Preoxygenation: Pre-oxygenation with 100% oxygen Intubation Type: Inhalational induction and IV induction Tube size: 7.5 (subglottic ett existing) mm Placement Confirmation: positive ETCO2 and breath sounds checked- equal and bilateral Secured at: 23 cm    Arterial line: Time out, sterile prep R wrist, #20 ga AC R radial artery, sterile dressing   Sandford Craze, MD

## 2013-03-26 NOTE — Progress Notes (Signed)
eLink Physician-Brief Progress Note Patient Name: Rodney [name] DOB: May 01, 1945 MRN: [id number]  Date of Service  03/26/2013   HPI/Events of Note  Call from bedside nurse reporting concern about patients hypotension, AMS, and apenic spells.  Camera check shows patient to be alert but with garbled speech, not following commands.  O2 sats on spot check are greater than 96%.  Has hypotension with BP in the 70s systolic checked on both arms.  Has periods of apnea lasting 32 secs then with Cheyne/Stokes pattern .  ABG is adequate, 7.45/25/359/17 on BiPAP earlier.  Has received sedating meds earlier last dose at appro 1015pm.   eICU Interventions  Plan: One dose of Narcan 0.4 mg IV Fluid bolus of 500 cc for hypotension Consider BiPAP for sleep apnea type respirations or at least FM O2 May need precedex if agitated instead of other sedating type meds Consider NEO gtt for BP support.   Intervention Category Intermediate Interventions: Respiratory distress - evaluation and management;Hypotension - evaluation and management  DETERDING,ELIZABETH 03/26/2013, 12:24 AM

## 2013-03-26 NOTE — Op Note (Signed)
    OPERATIVE REPORT  DATE OF SURGERY: 03/26/2013  PATIENT: Rodney [name], 67 y.o. male MRN: [id number]  DOB: 04-07-1945  PRE-OPERATIVE DIAGNOSIS: Gangrene right foot  POST-OPERATIVE DIAGNOSIS:  Same  PROCEDURE: Right above-knee amputation  SURGEON:  Gretta Began, M.D.  PHYSICIAN ASSISTANT: Rhyne  ANESTHESIA:  Gen.  EBL: Less than 100 ml  Total I/O In: 250 [I.V.:150; IV Piggyback:100] Out: 330 [Urine:330]  BLOOD ADMINISTERED: None  DRAINS: None  SPECIMEN: Right above-knee dictation  COUNTS CORRECT:  YES  PLAN OF CARE: Surgical intensive care unit stable   PATIENT DISPOSITION:  PACU - hemodynamically stable  PROCEDURE DETAILS: The patient was taken up replacing that is where the area of the right leg was prepped in usual sterile fashion. A fish mouth type incision was made over the distal femur and carried down through the fascia and muscle to the level of the femur. The patient had a great deal of edema but the muscle all appeared to be viable. Medium and larger vessels were controlled with hemostats. Electrocautery was used to divide the muscle. The neurovascular bundle was identified and the superficial femoral artery and superficial femoral vein were ligated with 0 heart ties and divided. The periosteum was elevated off the femur and the femur was divided with a Gigli saw. The bone edges were smoothed with a bone rasp. The specimen was passed off the field. The wounds were irrigated with saline and hemostasis obtained electrocautery. The anterior fascia was closed to the posterior fascia with interrupted 0 Vicryl figure-of-eight sutures. Wound again was irrigated and skin was closed with skin staples. A sterile dressing and Ace wrap were applied. The patient was transferred to the surgical intensive care unit in the dynamically stable   Gretta Began, M.D. 03/26/2013 11:44 AM

## 2013-03-26 NOTE — Progress Notes (Signed)
Dr. Sharl Ma instructed to hold Nitro paste and flomax. Nitro paste removed off pt's chest.

## 2013-03-26 NOTE — Progress Notes (Signed)
67yo male now w/ blood cx growing GNR, to change vanc and doxy to Cipro and Zosyn.  Will start Cipro 400mg  IV Q12H for CrCl ~60 ml/min and f/u C/S.  Vernard Gambles, PharmD, BCPS 10:49 PM 03/26/2013

## 2013-03-26 NOTE — Transfer of Care (Signed)
Immediate Anesthesia Transfer of Care Note  Patient: Rodney [name]  Procedure(s) Performed: Procedure(s): AMPUTATION ABOVE KNEE (Right)  Patient Location: PACU and SICU  Anesthesia Type:General  Level of Consciousness: sedated and Patient remains intubated per anesthesia plan  Airway & Oxygen Therapy: Patient remains intubated per anesthesia plan  Post-op Assessment: Post -op Vital signs reviewed and stable  Post vital signs: Reviewed and stable  Complications: No apparent anesthesia complications
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
## 2013-03-26 NOTE — Progress Notes (Signed)
Patient continues to be hypotensive despite fluid boluses, he has elevated lactic acid of 9.1, is now in altered mental status with cheyne stokes respiration secondary to septic shock from ischemic foot/gangrene. On exam : Grayish discoloration of the right foot , absent pedal pulse, cold, to touch Called and discussed with [facility] physician Dr Darrick Penna, will start the patient on pressor support with neo synephrine, call surgeon for the central venous access, intubate the patient and transfer to [facility] ICU. Called and discussed with patient's sister regarding the transfer to [facility].

## 2013-03-26 NOTE — Preoperative (Signed)
Beta Blockers   Reason not to administer Beta Blockers:Not Applicable 

## 2013-03-26 NOTE — Progress Notes (Signed)
Ordered 1 mg versed by IV. 2mg / 2mL available. Administered 1mg ; waste 1mg  in ICU med room needle box. Witnessed by Lovena Neighbours, RN

## 2013-03-26 NOTE — Progress Notes (Signed)
Unable to place A-line at this time due to low blood pressure and inability to detect effective pulse. RN notified.
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
## 2013-03-26 NOTE — Anesthesia Preprocedure Evaluation (Addendum)
Anesthesia Evaluation  Patient identified by MRN, date of birth, ID band Patient unresponsive    Reviewed: Allergy & Precautions, H&P , Patient's Chart, lab work & pertinent test results, Unable to perform ROS - Chart review only  Airway       Dental   Pulmonary Current Smoker,  Presently intubated and ventilated breath sounds clear to auscultation        Cardiovascular hypertension, + Peripheral Vascular Disease (ischemic leg) and +CHF Rhythm:Regular Rate:Normal  Hypotensive with sepsis: norepi infusion   Neuro/Psych    GI/Hepatic (+) Cirrhosis - (elevated LFTs)    substance abuse  alcohol use,   Endo/Other    Renal/GU      Musculoskeletal   Abdominal   Peds  Hematology   Anesthesia Other Findings intubated  Reproductive/Obstetrics                         Anesthesia Physical Anesthesia Plan  ASA: IV  Anesthesia Plan: General   Post-op Pain Management:    Induction: Inhalational  Airway Management Planned: Oral ETT  Additional Equipment: Arterial line  Intra-op Plan:   Post-operative Plan: Post-operative intubation/ventilation  Informed Consent:   Only emergency history available and History available from chart only  Plan Discussed with: CRNA and Surgeon  Anesthesia Plan Comments: (Dr. Arbie Cookey reviewed consent with family member of patient Plan routine monitors, A line, GETA with post op ventilation)        Anesthesia Quick Evaluation

## 2013-03-26 NOTE — Progress Notes (Signed)
At 1900 pt was alert and oriented to self, location and DOB. Pt had somewhat garbled speech. Pt followed commands. At 2200 pt stated it was hard for him to breathe. VS were taken and pt was hypotensive but maintained sats above 95%. Pt would then fall asleep quickly and would wake up only a few minutes later. Pt was arousable and had periods of labored breathing while awake and shallow breathing while "asleep". Pt then shortly there after became agitated, restless and disoriented. O2 sats could not be obtained. On call physician was notified, came and  assessed pt and ordered for pt to be put on continuous bipap and to be transferred to the ICU. Dr. Sharl Ma instructed for another BP to be taken and I could only hear a systolic of , Dr. Sharl Ma was in room and aware.  During transition of units pt was put on a NRB and was given a bolus. I accompanied pt's transfer to the ICU. Handoff was given to [place], [place], in ICU.

## 2013-03-26 NOTE — Progress Notes (Signed)
Pt transferred to [facility] via [facility]. Report called to Victorino Dike, Charity fundraiser. Pt and family (sister) made aware of transfer. Personal belongings sent with pt.

## 2013-03-26 NOTE — Consult Note (Addendum)
Referred by:  Dr. Delford Field (PCCM)  Reason for referral: bilateral foot ischemia  History of Present Illness  Rodney [name] is a 67 y.o. (1945/12/04) male who presents with chief complaint: septic shock and ischemic feet.  History cannot be obtained as patient intubated and sedated.  Patient was transferred from outside hospital with presumed septic shock and ischemic feet.  By report from transfer chart, patient has a known history of alcoholism and injuried his right foot one month ago while drunk.  The right foot has failed to heal since then.   No further history is known.   Past Medical History  Diagnosis Date  . HTN (hypertension)   . Smoking hx 03/25/2013  . Alcohol abuse 03/25/2013  . CHF (congestive heart failure) 03/25/2013   Past Surgical History: unknown, cannot be obtained due to intubation  History   Social History  . Marital Status: Single    Spouse Name: N/A    Number of Children: N/A  . Years of Education: N/A   Occupational History  . Not on file.   Social History Main Topics  . Smoking status: Current Every Day Smoker    Types: Cigarettes  . Smokeless tobacco: Not on file  . Alcohol Use: Yes     Comment: everyday  . Drug Use: Yes    Special: Marijuana  . Sexual Activity: No   Other Topics Concern  . Not on file   Social History Narrative  . No narrative on file   Family History: unknown, cannot be obtained due to intubation  Current Facility-Administered Medications  Medication Dose Route Frequency Provider Last Rate Last Dose  . 0.9 %  sodium chloride infusion  250 mL Intravenous PRN Zigmund Gottron, MD      . acetaminophen (TYLENOL) suppository 650 mg  650 mg Rectal Q6H PRN Gwenyth Bender, NP      . alum & mag hydroxide-simeth (MAALOX/MYLANTA) 200-200-20 MG/5ML suspension 30 mL  30 mL Oral Q6H PRN Gwenyth Bender, NP      . antiseptic oral rinse (BIOTENE) solution 15 mL  15 mL Mouth Rinse QID Zigmund Gottron, MD   15 mL at 03/26/13  0400  . aspirin chewable tablet 81 mg  81 mg Oral Daily Leroy Sea, MD   81 mg at 03/25/13 1705  . chlordiazePOXIDE (LIBRIUM) capsule 10 mg  10 mg Oral TID Leroy Sea, MD   10 mg at 03/25/13 2103  . chlorhexidine (PERIDEX) 0.12 % solution 15 mL  15 mL Mouth Rinse BID Zigmund Gottron, MD      . doxycycline (VIBRAMYCIN) 100 mg in dextrose 5 % 250 mL IVPB  100 mg Intravenous Q12H Lesle Chris Black, NP   100 mg at 03/25/13 1850  . etomidate (AMIDATE) 2 MG/ML injection           . fentaNYL (SUBLIMAZE) injection 50 mcg  50 mcg Intravenous Q15 min PRN Zigmund Gottron, MD      . fentaNYL (SUBLIMAZE) injection 50 mcg  50 mcg Intravenous Q2H PRN Zigmund Gottron, MD   50 mcg at 03/26/13 0645  . folic acid (FOLVITE) tablet 1 mg  1 mg Oral Daily Leroy Sea, MD   1 mg at 03/25/13 1705  . heparin injection 5,000 Units  5,000 Units Subcutaneous Q8H Storm Frisk, MD      . insulin aspart (novoLOG) injection 0-15 Units  0-15 Units Subcutaneous Q4H Zigmund Gottron, MD      .  lidocaine (cardiac) 100 mg/64ml (XYLOCAINE) 20 MG/ML injection 2%           . midazolam (VERSED) 2 MG/2ML injection           . naloxone (NARCAN) 0.4 MG/ML injection           . naloxone [facility]) injection 0.4 mg  0.4 mg Intravenous PRN Zigmund Gottron, MD   0.4 mg at 03/26/13 0017  . norepinephrine (LEVOPHED) 16 mg in dextrose 5 % 250 mL infusion  2-50 mcg/min Intravenous Continuous Storm Frisk, MD      . ondansetron [facility]) injection 4 mg  4 mg Intravenous Q6H PRN Lesle Chris Black, NP      . pantoprazole (PROTONIX) injection 40 mg  40 mg Intravenous Daily Zigmund Gottron, MD      . piperacillin-tazobactam (ZOSYN) IVPB 3.375 g  3.375 g Intravenous Q8H Leroy Sea, MD   3.375 g at 03/26/13 0105  . potassium chloride 10 mEq in 50 mL *CENTRAL LINE* IVPB  10 mEq Intravenous Q1 Hr x 4 Storm Frisk, MD      . rocuronium [facility]) 50 MG/5ML injection           . sodium chloride 0.9 %  bolus 1,000 mL  1,000 mL Intravenous PRN Storm Frisk, MD      . sodium chloride 0.9 % injection 10-40 mL  10-40 mL Intracatheter Q12H Dalia Heading, MD   10 mL at 03/26/13 0412  . sodium chloride 0.9 % injection 10-40 mL  10-40 mL Intracatheter PRN Dalia Heading, MD      . sodium chloride 0.9 % injection 3 mL  3 mL Intravenous Q12H Lesle Chris Black, NP      . sodium chloride 0.9 % injection 3 mL  3 mL Intravenous Q12H Lesle Chris Black, NP      . sodium chloride 0.9 % injection 3 mL  3 mL Intravenous PRN Gwenyth Bender, NP      . thiamine (B-1) injection 100 mg  100 mg Intravenous Daily Leroy Sea, MD      . vancomycin (VANCOCIN) IVPB 1000 mg/200 mL premix  1,000 mg Intravenous Q12H Leroy Sea, MD   1,000 mg at 03/25/13 2353   No Known Allergies   REVIEW OF SYSTEMS:  (Positives checked otherwise negative)  Cannot be obtained as pt intubated  Physical Examination  Filed Vitals:   03/26/13 0715 03/26/13 0730 03/26/13 0745 03/26/13 0804  BP: 112/67 119/67 126/66   Pulse: 78 76 139   Temp:    96.8 F (36 C)  TempSrc:    Axillary  Resp: 18 19 16    Height:   5\' 8"  (1.727 m)   Weight:   179 lb 14.3 oz (81.6 kg)   SpO2: 100% 100% 80%     Body mass index is 27.36 kg/(m^2).  General: eyes open, not consistently responsive  Head: [place]/AT  Ear/Nose/Throat: Hearing cannot be tested, endotracheal tube in place  Eyes: PERRL, EOM cannot be tested due to poor cooperation, some tracking  Neck: Supple, no nuchal rigidity, no palpable LAD  Pulmonary: Sym exp, good air movt, CTAB, no rales, rhonchi, & wheezing  Cardiac: RRR, Nl S1, S2, no Murmurs, rubs or gallops  Vascular: Vessel Right Left  Radial Not Palpable Palpable  Brachial Faintly Palpable Palpable  Carotid Palpable, without bruit Palpable, without bruit  Aorta Not palpable N/A  Femoral Faintly palpable Faintly Palpable  Popliteal Not palpable Not palpable  PT Not Palpable Not Palpable  DP Not Palpable Not Palpable  (+signal)   Gastrointestinal: soft, NTND, -G/R, - HSM, - masses, - CVAT B  Musculoskeletal: M/S not possible due to poor cooperation, R foot dead with some rigidity, L foot cool without frank gangrene  Neurologic: CN cannot be tested due to poor cooperation and intubation  Psychiatric: CN cannot be tested due to poor cooperation and intubation  Dermatologic: See M/S exam for extremity exam, no rashes otherwise noted  Lymph : No Cervical, Axillary, or Inguinal lymphadenopathy   Radiology: Dg Chest 2 View  03/25/2013   CLINICAL DATA:  Weakness, lower extremity edema, and dyspnea  EXAM: CHEST  2 VIEW  COMPARISON:  None.  FINDINGS: The lungs are adequately inflated. The cardiopericardial silhouette is enlarged. There is a small left pleural effusion blunting the costophrenic angles. The pulmonary vascularity is mildly prominent centrally. Definite cephalization is not demonstrated. The mediastinum is normal in width. The trachea is midline.  IMPRESSION: The findings are consistent with low grade CHF with small left-sided pleural effusion and mild central pulmonary vascular congestion.   Electronically Signed   By: David  [place]   On: 03/25/2013 13:15   US Arterial Seg Multiple  03/25/2013   CLINICAL DATA:  Cold feet, right lower leg numbness and nonhealing wounds post blunt trauma.  EXAM: NONINVASIVE PHYSIOLOGIC VASCULAR STUDY OF BILATERAL LOWER EXTREMITIES  TECHNIQUE: Evaluation of both lower extremities was performed at rest, including calculation of ankle-brachial indices, multiple segmental pressure evaluation, segmental Doppler and segmental pulse volume recording.  COMPARISON:  None.  FINDINGS: Right ABI: Not calculable due to no Doppler arterial flow signal at the ankle level.  Left ABI: Not calculable due to no Doppler arterial flow signal at the ankle level.  Right Lower Extremity: Biphasic waveforms through the SFA, with no Doppler arterial flow signal from the popliteal artery inferiorly.   Left Lower Extremity: Biphasic waveforms through the popliteal artery, with no Doppler arterial flow signal inferiorly.  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   On: 03/25/2013 20:14   Portable Chest Xray  03/26/2013   CLINICAL DATA:  Endotracheal tube placement. Central line placement.  EXAM: PORTABLE CHEST - 1 VIEW  COMPARISON:  Chest radiograph performed 03/25/2013  FINDINGS: The patient's endotracheal tube is seen ending 4 cm above the carina. A right subclavian line is noted ending about the mid to distal SVC. An enteric tube is seen extending below the diaphragm.  New retrocardiac airspace opacity may reflect atelectasis or possibly pneumonia. Vascular congestion is noted. No pleural effusion or pneumothorax is seen.  The cardiomediastinal silhouette is enlarged. No acute osseous abnormalities are identified.  IMPRESSION: 1. Endotracheal tube seen ending 4 cm above the carina. 2. Right subclavian line noted ending about the mid to distal SVC. 3. New retrocardiac airspace opacity may reflect atelectasis or possibly pneumonia. 4. Vascular congestion and cardiomegaly noted.   Electronically Signed   By: Roanna Raider M.D.   On: 03/26/2013 04:16   Dg Foot Complete Right  03/25/2013   CLINICAL DATA:  Stent necrosis findings concerning for gangrene  EXAM: RIGHT FOOT COMPLETE - 3+ VIEW  COMPARISON:  None.  FINDINGS: There is no evidence of fracture nor dislocation. There is no evidence of subcutaneous emphysema the regions of cortical destruction. A small osseous flecks projects along the lateral border of the base of the 5th metatarsal. This may represent sequela  of a chronic avulsion fragment.  IMPRESSION: No radiographic evidence of focal or acute abnormalities. There is no radiographic evidence to raise concern of osteomyelitis.   Electronically Signed   By: Salome Holmes M.D.   On:  03/25/2013 16:45    Laboratory: CBC:    Component Value Date/Time   WBC 9.5 03/25/2013 1240   RBC 5.03 03/25/2013 1240   HGB 15.0 03/25/2013 1240   HCT 44.2 03/25/2013 1240   PLT 157 03/25/2013 1240   MCV 87.9 03/25/2013 1240   MCH 29.8 03/25/2013 1240   MCHC 33.9 03/25/2013 1240   RDW 17.9* 03/25/2013 1240   LYMPHSABS 1.0 03/25/2013 1240   MONOABS 0.4 03/25/2013 1240   EOSABS 0.0 03/25/2013 1240   BASOSABS 0.0 03/25/2013 1240    BMP:    Component Value Date/Time   NA 140 03/26/2013 0422   K 3.0* 03/26/2013 0422   CL 104 03/26/2013 0422   CO2 22 03/26/2013 0422   GLUCOSE 84 03/26/2013 0422   BUN 22 03/26/2013 0422   CREATININE 1.11 03/26/2013 0422   CALCIUM 7.4* 03/26/2013 0422   GFRNONAA 66* 03/26/2013 0422   GFRAA 77* 03/26/2013 0422    Coagulation: No results found for this basename: INR, PROTIME   No results found for this basename: PTT    Cardiac Panel (last 3 results)  Recent Labs  03/25/13 1625 03/25/13 2159 03/26/13 0422  TROPONINI <0.30 <0.30 0.70*   Hepatic Function Panel     Component Value Date/Time   PROT 4.8* 03/26/2013 0422   ALBUMIN 1.8* 03/26/2013 0422   AST 41* 03/26/2013 0422   ALT 17 03/26/2013 0422   ALKPHOS 105 03/26/2013 0422   BILITOT 1.6* 03/26/2013 0422   BILIDIR 1.1* 03/26/2013 0422   IBILI 0.5 03/26/2013 0422   Medical Decision Making  Rodney [name] is a 67 y.o. male who presents with: septic vs cardiogenic shock, dead right foot, BLE severe PAD, severe protein malnutrition.  Only monophasic signals in both femoral arteries, so likely some degree of inflow disease along with tibial disease.  No salvage option for dead right foot with is beginning to demonstrate some rigor mortis.  I would favor right AKA as I can't feel a strong popliteal pulse in the right leg.    Also the patient has severe protein malnutrition so I favor the R AKA to give him the best chance to heal.  LLE will need ABI and angiography once  medically stable.  No immediate intervention needed on that foot as it has still a dopplerable DP signal.  I have discuss the surgical plan with the patient's aunt and cousin.  Both are trying to get in touch with his sister to get consent.  Thank you for allowing [place] to participate in this patient's care.  Leonides Sake, MD [facility] Office: [phone] Pager: [phone]  03/26/2013, 8:13 AM

## 2013-03-26 NOTE — Progress Notes (Signed)
PT Cancellation Note  Patient Details Name: Rodney [name] MRN: [id number] DOB: [birth year]   Cancelled Treatment:    Reason Eval/Treat Not Completed: Patient not medically ready   [facility] 03/26/2013, 9:51 AM

## 2013-03-26 NOTE — Procedures (Signed)
Anesthesia intubation:After pre o2.Marland KitchenMarland KitchenVersed 1 mg as well as Anectine 50 mg.Successful intubation 7.0 ETT verified by capnograph,BBS and c-xray.     BP 98/60, HR 72 post procedure

## 2013-03-26 NOTE — Consult Note (Signed)
WOC wound consult note Reason for Consult: evaluation of dorsal foot wounds, RLE. Pt with no palpable pulses bilaterally and no doppler flow on vascular studies performed 03/25/13. Pt intubated in the ICU, just arrived via [facility] from [facility].  Wound type:severe arterial disease with arterial ulcerations of the right dorsal foot, Pressure Ulcer POA: Yes Stage II PU sacral per bedside nursing, documented on flow sheet, sacral dressing implemented Wound bed: dry adherent yellow/black slough and gangrenous changes of the right foot.  Left foot with no apparent ulcerations but cool to the touch as well. Drainage (amount, consistency, odor) none Periwound:intact Dressing procedure/placement/frequency: no topical care needed, refer to vascular surgeon for surgical options at this point.   Discussed POC bedside nurse.  Re consult if needed, will not follow at this time. Thanks  Jackye Dever [employer], CWOCN [phone])

## 2013-03-26 NOTE — Progress Notes (Signed)
CRITICAL VALUE ALERT  Critical value received:  Aerobic bottle gram negative rods   Date of notification:  03/2413  Time of notification:  2215  Critical value read back: yes   Nurse who received alert:  A. Lamar Benes RN  MD notified (1st page):  Dr. Sung Amabile     Dr. Sung Amabile aware. Thresa Ross RN

## 2013-03-27 ENCOUNTER — Inpatient Hospital Stay (HOSPITAL_COMMUNITY): Payer: Medicare Other

## 2013-03-27 DIAGNOSIS — F10231 Alcohol dependence with withdrawal delirium: Secondary | ICD-10-CM | POA: Diagnosis present

## 2013-03-27 DIAGNOSIS — J9601 Acute respiratory failure with hypoxia: Secondary | ICD-10-CM | POA: Diagnosis present

## 2013-03-27 LAB — BASIC METABOLIC PANEL
BUN: 23 mg/dL (ref 6–23)
CO2: 23 mEq/L (ref 19–32)
Chloride: 103 mEq/L (ref 96–112)
Creatinine, Ser: 1.13 mg/dL (ref 0.50–1.35)
Sodium: 137 mEq/L (ref 135–145)

## 2013-03-27 LAB — GLUCOSE, CAPILLARY
Glucose-Capillary: 81 mg/dL (ref 70–99)
Glucose-Capillary: 85 mg/dL (ref 70–99)
Glucose-Capillary: 95 mg/dL (ref 70–99)
Glucose-Capillary: 96 mg/dL (ref 70–99)

## 2013-03-27 MED ORDER — VITAL AF 1.2 CAL PO LIQD
1000.0000 mL | ORAL | Status: DC
  Administered 2013-03-27: 1000 mL
  Filled 2013-03-27 (×3): qty 1000

## 2013-03-27 MED ORDER — PANTOPRAZOLE SODIUM 40 MG PO PACK
40.0000 mg | PACK | Freq: Every day | ORAL | Status: DC
  Administered 2013-03-27 – 2013-03-28 (×2): 40 mg
  Filled 2013-03-27 (×4): qty 20

## 2013-03-27 MED ORDER — DEXMEDETOMIDINE HCL IN NACL 200 MCG/50ML IV SOLN
0.0000 ug/kg/h | INTRAVENOUS | Status: DC
  Administered 2013-03-27: 0.1 ug/kg/h via INTRAVENOUS
  Administered 2013-03-27: 0.4 ug/kg/h via INTRAVENOUS
  Filled 2013-03-27: qty 50

## 2013-03-27 MED ORDER — POTASSIUM CHLORIDE 10 MEQ/50ML IV SOLN
10.0000 meq | INTRAVENOUS | Status: AC
  Administered 2013-03-27 (×4): 10 meq via INTRAVENOUS
  Filled 2013-03-27: qty 50

## 2013-03-27 NOTE — Progress Notes (Signed)
Attempted to wean vent today x3-4 minutes.  Pt continued to have apnea, and backup ventilation mode initiated on wean.  Placed pt back on full vent support.  RN aware.

## 2013-03-27 NOTE — Progress Notes (Signed)
eLink Physician-Brief Progress Note Patient Name: Rodney [name] DOB: 09/10/1945 MRN: [id number]  Date of Service  03/27/2013   HPI/Events of Note   Extensive exctopy, pvc, etc  eICU Interventions  Dc dobut lytes   Intervention Category Major Interventions: Arrhythmia - evaluation and management  Nelda Bucks. 03/27/2013, 11:27 PM

## 2013-03-27 NOTE — Progress Notes (Signed)
Name: Rodney [name] MRN: [id number] DOB: 1946-03-19    ADMISSION DATE:  03/25/2013 CONSULTATION DATE:  03/27/2013  REFERRING MD :  [facility] PRIMARY SERVICE: PCCM  CHIEF COMPLAINT:   Septic shock  BRIEF PATIENT DESCRIPTION:  67 y.o.AAM with ischemic LE, sepsis, shock, VDRF tfr from [facility] for vascular and CCM care 03/26/13   SIGNIFICANT EVENTS / STUDIES:  12/24: intubated for resp failure at [facility] and [facility] 12/24: for OR for R AKA  LINES / TUBES: R [employer] CVL 12/24 ETT 12/24 R rad art line 12/24>>12/25   CULTURES: BC x 2 12/23>>GNR 2/2 >>   ANTIBIOTICS: 12/23 doxy>>12/24 12/23 vanco12/24 12/23 zosyn 12/24 cipro  SUBJECTIVE:  Agitated overnight.  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    VITAL SIGNS: Temp:  [96.1 F (35.6 C)-98.7 F (37.1 C)] 97.6 F (36.4 C) (12/25 0752) Pulse Rate:  [73-97] 82 (12/25 0730) Resp:  [16-24] 18 (12/25 0730) BP: (96-171)/(49-68) 124/59 mmHg (12/25 0730) SpO2:  [87 %-100 %] 100 % (12/25 0730) Arterial Line BP: (134-160)/(51-57) 158/51 mmHg (12/24 1515) FiO2 (%):  [40 %] 40 % (12/25 0700) Weight:  [61.054 kg (134 lb 9.6 oz)] 61.054 kg (134 lb 9.6 oz) (12/25 0600) HEMODYNAMICS: CVP:  [8 mmHg-16 mmHg] 10 mmHg 7.63mcg DBA; Levophed 2 mcg Reached Co ox goal>>>60>>75% Reached septic shock goals of Hgb, MAP, CVP, UOP, Coox   VENTILATOR SETTINGS: Vent Mode:  [-] PRVC FiO2 (%):  [40 %] 40 % Set Rate:  [18 bmp] 18 bmp Vt Set:  [550 mL] 550 mL PEEP:  [5 cmH20] 5 cmH20 Plateau Pressure:  [12 cmH20-22 cmH20] 12 cmH20 INTAKE / OUTPUT: Intake/Output     12/24 0701 - 12/25 0700 12/25 0701 - 12/26 0700   I.V. (mL/kg) 1502.3 (24.6)    NG/GT 30    IV Piggyback 700 50   Total Intake(mL/kg) 2232.3 (36.6) 50 (0.8)   Urine (mL/kg/hr) 1225 (0.8)    Emesis/NG output 100 (0.1)    Total Output 1325     Net +907.3 +50          PHYSICAL EXAMINATION: General:  Ill appearing AAM in NAD Neuro:  Sedated on vent HEENT:  +JVD   Cardiovascular:  RRR nl s1 s2 no  s3 Lungs:  clearer Abdomen:  Soft NT  Musculoskeletal:  R AKA dressing dry, intact Skin: clear  LABS:  CBC  Recent Labs Lab 03/25/13 1240 03/26/13 1049 03/26/13 1055  WBC 9.5  --  32.4*  HGB 15.0 12.2* 11.2*  HCT 44.2 36.0* 32.4*  PLT 157  --  92*   Coag's  Recent Labs Lab 03/26/13 1055  INR 2.12*   BMET  Recent Labs Lab 03/25/13 1240 03/26/13 0422 03/26/13 1049 03/27/13 0430  NA 140 140 140 137  K 3.9 3.0* 3.9 3.1*  CL 100 104  --  103  CO2 23 22  --  23  BUN 18 22  --  23  CREATININE 0.64 1.11  --  1.13  GLUCOSE 119* 84  --  98   Electrolytes  Recent Labs Lab 03/25/13 1240 03/26/13 0422 03/27/13 0430  CALCIUM 9.3 7.4* 7.0*   Sepsis Markers  Recent Labs Lab 03/25/13 2312  LATICACIDVEN 9.1*   ABG  Recent Labs Lab 03/25/13 2308 03/26/13 0330 03/26/13 1049  PHART 7.455* 7.422 7.403  PCO2ART 25.8* 30.4* 36.2  PO2ART 359.0* 132.0* 394.0*   Liver Enzymes  Recent Labs Lab 03/25/13 1240 03/26/13 0422  AST 52* 41*  ALT 26 17  ALKPHOS 162* 105  BILITOT 2.2* 1.6*  ALBUMIN 3.0* 1.8*   Cardiac Enzymes  Recent Labs Lab 03/25/13 1240 03/25/13 1625 03/25/13 2159 03/26/13 0422  TROPONINI <0.30 <0.30 <0.30 0.70*  PROBNP 14339.0*  --   --   --    Glucose  Recent Labs Lab 03/26/13 1436 03/26/13 1548 03/26/13 2008 03/26/13 2359 03/27/13 0014 03/27/13 0408  GLUCAP 96 98 98 95 107* 89    Imaging Dg Chest 2 View  03/25/2013   CLINICAL DATA:  Weakness, lower extremity edema, and dyspnea  EXAM: CHEST  2 VIEW  COMPARISON:  None.  FINDINGS: The lungs are adequately inflated. The cardiopericardial silhouette is enlarged. There is a small left pleural effusion blunting the costophrenic angles. The pulmonary vascularity is mildly prominent centrally. Definite cephalization is not demonstrated. The mediastinum is normal in width. The trachea is midline.  IMPRESSION: The findings are consistent with low grade CHF with small left-sided  pleural effusion and mild central pulmonary vascular congestion.   Electronically Signed   By: David  [place]   On: 03/25/2013 13:15   US Arterial Seg Multiple  03/25/2013   CLINICAL DATA:  Cold feet, right lower leg numbness and nonhealing wounds post blunt trauma.  EXAM: NONINVASIVE PHYSIOLOGIC VASCULAR STUDY OF BILATERAL LOWER EXTREMITIES  TECHNIQUE: Evaluation of both lower extremities was performed at rest, including calculation of ankle-brachial indices, multiple segmental pressure evaluation, segmental Doppler and segmental pulse volume recording.  COMPARISON:  None.  FINDINGS: Right ABI: Not calculable due to no Doppler arterial flow signal at the ankle level.  Left ABI: Not calculable due to no Doppler arterial flow signal at the ankle level.  Right Lower Extremity: Biphasic waveforms through the SFA, with no Doppler arterial flow signal from the popliteal artery inferiorly.  Left Lower Extremity: Biphasic waveforms through the popliteal artery, with no Doppler arterial flow signal inferiorly.  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   On: 03/25/2013 20:14   Portable Chest Xray  03/26/2013   CLINICAL DATA:  Endotracheal tube placement. Central line placement.  EXAM: PORTABLE CHEST - 1 VIEW  COMPARISON:  Chest radiograph performed 03/25/2013  FINDINGS: The patient's endotracheal tube is seen ending 4 cm above the carina. A right subclavian line is noted ending about the mid to distal SVC. An enteric tube is seen extending below the diaphragm.  New retrocardiac airspace opacity may reflect atelectasis or possibly pneumonia. Vascular congestion is noted. No pleural effusion or pneumothorax is seen.  The cardiomediastinal silhouette is enlarged. No acute osseous abnormalities are identified.  IMPRESSION: 1. Endotracheal tube seen ending 4 cm above the carina. 2. Right subclavian line noted  ending about the mid to distal SVC. 3. New retrocardiac airspace opacity may reflect atelectasis or possibly pneumonia. 4. Vascular congestion and cardiomegaly noted.   Electronically Signed   By: Roanna Raider M.D.   On: 03/26/2013 04:16   Dg Foot Complete Right  03/25/2013   CLINICAL DATA:  Stent necrosis findings concerning for gangrene  EXAM: RIGHT FOOT COMPLETE - 3+ VIEW  COMPARISON:  None.  FINDINGS: There is no evidence of fracture nor dislocation. There is no evidence of subcutaneous emphysema the regions of cortical destruction. A small osseous flecks projects along the lateral border of the base of the 5th metatarsal. This may represent sequela of a chronic avulsion fragment.  IMPRESSION: No radiographic evidence of focal or acute abnormalities. There is no  radiographic evidence to raise concern of osteomyelitis.   Electronically Signed   By: Salome Holmes M.D.   On: 03/25/2013 16:45     CXR: 12/25  stable post op, ETT ok   ASSESSMENT / PLAN:  PULMONARY A:VDRF, pulm edema, RLL CAP, septic shock d/t vascular ischemia Acute resp failure  P:   Full vent until off pressors Wua/sbt   CARDIOVASCULAR A: septic shock>>Resolving CHF need to define. ?alch CM Severe PVD s/p R AKA Reached Co ox goal>>>60>>75% Reached septic shock goals of Hgb, MAP, CVP, UOP, Coox   P:  Per VVS Titrate pressors F/u echo  RENAL A:  AKI>>SCr stable hypokalemia P:   Monitor Replete K  GASTROINTESTINAL A:  Ileus, alchol use Severe protein cal malnutrition P:   Monitor lft ppi sup Start TF  HEMATOLOGIC A:  No acute issues P:  monitor  INFECTIOUS A:  Sepsis septic shock>>improved.  GNR bacteremia P:   Sepsis protocol completed See dashboard>>abx narrowed  ENDOCRINE A:  No issues   P:   SSI  NEUROLOGIC A: ETOH WD, acute delirium P:   Start precedex Pain control  TODAY'S SUMMARY:  67 y.o.M with ischemic LE, septic shock. VDRF.  GNR bacteremia, s/p AKA   Plan wean  pressors, start TF, start precedex, wean vent as able.    I have personally obtained a history, examined the patient, evaluated laboratory and imaging results, formulated the assessment and plan and placed orders. CRITICAL CARE: The patient is critically ill with multiple organ systems failure and requires high complexity decision making for assessment and support, frequent evaluation and titration of therapies, application of advanced monitoring technologies and extensive interpretation of multiple databases. Critical Care Time devoted to patient care services described in this note is 40  minutes.   Dorcas Carrow Beeper  [phone]  Cell  [phone]  If no response or cell goes to voicemail, call beeper [phone]  Pulmonary and Critical Care Medicine [facility] Pager: [phone]  03/27/2013, 7:57 AM

## 2013-03-27 NOTE — Progress Notes (Addendum)
[facility] Progress Note  03/27/2013 7:42 AM POD 1  Subjective:  intubated  Afebrile HR  70's-90's 90's-120's systolic 100% .40 FiO2  GTTS: Dobutamine at 7.5 mcg/kg/min Levophed at 2 /minmcg   ABx: Cipro Zosyn  Filed Vitals:   03/27/13 0730  BP: 124/59  Pulse: 82  Temp:   Resp: 18    Physical Exam: Incisions:  Bandage in tact and is dry  CBC    Component Value Date/Time   WBC 32.4* 03/26/2013 1055   RBC 3.66* 03/26/2013 1055   HGB 11.2* 03/26/2013 1055   HCT 32.4* 03/26/2013 1055   PLT 92* 03/26/2013 1055   MCV 88.5 03/26/2013 1055   MCH 30.6 03/26/2013 1055   MCHC 34.6 03/26/2013 1055   RDW 16.7* 03/26/2013 1055   LYMPHSABS 1.0 03/25/2013 1240   MONOABS 0.4 03/25/2013 1240   EOSABS 0.0 03/25/2013 1240   BASOSABS 0.0 03/25/2013 1240    BMET    Component Value Date/Time   NA 137 03/27/2013 0430   K 3.1* 03/27/2013 0430   CL 103 03/27/2013 0430   CO2 23 03/27/2013 0430   GLUCOSE 98 03/27/2013 0430   BUN 23 03/27/2013 0430   CREATININE 1.13 03/27/2013 0430   CALCIUM 7.0* 03/27/2013 0430   GFRNONAA 65* 03/27/2013 0430   GFRAA 75* 03/27/2013 0430    INR    Component Value Date/Time   INR 2.12* 03/26/2013 1055     Intake/Output Summary (Last 24 hours) at 03/27/13 0742 Last data filed at 03/27/13 7829  Gross per 24 hour  Intake 2282.34 ml  Output   1325 ml  Net 957.34 ml     Assessment/Plan:  67 y.o. male is s/p right above knee amputation  POD 1  -tp with clean dry dressing in tact.  Will take down dressing tomorrow -pt with significantly increased WBC this am-GNR on gram stain for blood culture.  ABx changed from vanc/doxy to Cipro and Zosyn. -acute surgical blood loss anemia   Doreatha Massed, PA-C [facility] [phone] 03/27/2013 7:42 AM  Addendum  I have independently interviewed and examined the patient, and I agree with the physician assistant's findings.  No acute bleeding from R  AKA.  Sepsis improving.  Once medically stable would obtain L ABI and possible LLE angiogram.  Leonides Sake, MD [facility] Office: [phone] Pager: [phone]  03/27/2013, 8:19 AM

## 2013-03-27 NOTE — Progress Notes (Signed)
[facility] ADULT ICU REPLACEMENT PROTOCOL FOR AM LAB REPLACEMENT ONLY  The patient does apply for the [facility] Adult ICU Electrolyte Replacment Protocol based on the criteria listed below:   1. Is GFR >/= 40 ml/min? yes  Patient's GFR today is 75 2. Is urine output >/= 0.5 ml/kg/hr for the last 6 hours? yes Patient's UOP is 0.6 ml/kg/hr 3. Is BUN < 60 mg/dL? yes  Patient's BUN today is 23 4. Abnormal electrolyte(s): Potassium 5. Ordered repletion with: Potassium per protocol  Shanterria Franta P 03/27/2013 6:11 AM

## 2013-03-27 NOTE — Progress Notes (Signed)
Unable to hear OG tube placement in the stomach with air bolus. Tube advanced 6cm and placement was confirmed with air bolus, will get KUB for xray placment

## 2013-03-28 ENCOUNTER — Inpatient Hospital Stay (HOSPITAL_COMMUNITY): Payer: Medicare Other

## 2013-03-28 ENCOUNTER — Telehealth: Payer: Self-pay | Admitting: Vascular Surgery

## 2013-03-28 DIAGNOSIS — I5021 Acute systolic (congestive) heart failure: Secondary | ICD-10-CM

## 2013-03-28 DIAGNOSIS — I369 Nonrheumatic tricuspid valve disorder, unspecified: Secondary | ICD-10-CM

## 2013-03-28 DIAGNOSIS — F10931 Alcohol use, unspecified with withdrawal delirium: Secondary | ICD-10-CM

## 2013-03-28 DIAGNOSIS — F10231 Alcohol dependence with withdrawal delirium: Secondary | ICD-10-CM

## 2013-03-28 HISTORY — DX: Acute systolic (congestive) heart failure: I50.21

## 2013-03-28 LAB — GLUCOSE, CAPILLARY
Glucose-Capillary: 133 mg/dL — ABNORMAL HIGH (ref 70–99)
Glucose-Capillary: 119 mg/dL — ABNORMAL HIGH (ref 70–99)
Glucose-Capillary: 111 mg/dL — ABNORMAL HIGH (ref 70–99)

## 2013-03-28 LAB — BASIC METABOLIC PANEL
BUN: 23 mg/dL (ref 6–23)
Calcium: 7 mg/dL — ABNORMAL LOW (ref 8.4–10.5)
Calcium: 7.2 mg/dL — ABNORMAL LOW (ref 8.4–10.5)
Creatinine, Ser: 0.99 mg/dL (ref 0.50–1.35)
Creatinine, Ser: 1 mg/dL (ref 0.50–1.35)
GFR calc Af Amer: >90 mL/min (ref >90)
GFR calc non Af Amer: 75 mL/min — ABNORMAL LOW (ref >90)
GFR calc non Af Amer: 82 mL/min — ABNORMAL LOW (ref >90)
Glucose, Bld: 110 mg/dL — ABNORMAL HIGH (ref 70–99)
Glucose, Bld: 153 mg/dL — ABNORMAL HIGH (ref 70–99)
Sodium: 136 mEq/L (ref 135–145)

## 2013-03-28 LAB — CBC
MCH: 30.5 pg (ref 26.0–34.0)
MCHC: 35.4 g/dL (ref 30.0–36.0)
Platelets: 57 10*3/uL — ABNORMAL LOW (ref 150–400)
RDW: 16.7 % — ABNORMAL HIGH (ref 11.5–15.5)

## 2013-03-28 LAB — CULTURE, BLOOD (ROUTINE X 2)

## 2013-03-28 LAB — MAGNESIUM: Magnesium: 1.6 mg/dL (ref 1.5–2.5)

## 2013-03-28 LAB — PHOSPHORUS: Phosphorus: 2.2 mg/dL — ABNORMAL LOW (ref 2.3–4.6)

## 2013-03-28 MED ORDER — SODIUM CHLORIDE 0.9 % IV BOLUS (SEPSIS)
500.0000 mL | Freq: Once | INTRAVENOUS | Status: AC
  Administered 2013-03-28: 500 mL via INTRAVENOUS

## 2013-03-28 MED ORDER — VITAL AF 1.2 CAL PO LIQD
1000.0000 mL | ORAL | Status: DC
  Administered 2013-03-28 – 2013-03-29 (×2): 1000 mL
  Filled 2013-03-28 (×4): qty 1000

## 2013-03-28 MED ORDER — MILRINONE IN DEXTROSE 20 MG/100ML IV SOLN
0.2500 ug/kg/min | INTRAVENOUS | Status: DC
  Administered 2013-03-28 – 2013-03-30 (×4): 0.25 ug/kg/min via INTRAVENOUS
  Filled 2013-03-28 (×5): qty 100

## 2013-03-28 MED ORDER — MAGNESIUM SULFATE 40 MG/ML IJ SOLN
4.0000 g | Freq: Once | INTRAMUSCULAR | Status: AC
  Administered 2013-03-28: 4 g via INTRAVENOUS
  Filled 2013-03-28: qty 100

## 2013-03-28 MED ORDER — VITAMIN B-1 100 MG PO TABS
100.0000 mg | ORAL_TABLET | Freq: Every day | ORAL | Status: DC
  Administered 2013-03-28 – 2013-04-07 (×11): 100 mg
  Filled 2013-03-28 (×11): qty 1

## 2013-03-28 MED ORDER — PRO-STAT SUGAR FREE PO LIQD
30.0000 mL | Freq: Every day | ORAL | Status: DC
  Administered 2013-03-28 – 2013-03-29 (×2): 30 mL
  Filled 2013-03-28 (×3): qty 30

## 2013-03-28 MED ORDER — DEXTROSE 5 % IV SOLN: 4.0000 g | Freq: Once | INTRAVENOUS | Status: DC

## 2013-03-28 MED ORDER — HYDROCORTISONE SOD SUCCINATE 100 MG IJ SOLR
50.0000 mg | Freq: Four times a day (QID) | INTRAMUSCULAR | Status: DC
  Administered 2013-03-28 – 2013-03-30 (×9): 50 mg via INTRAVENOUS
  Filled 2013-03-28 (×13): qty 1

## 2013-03-28 MED ORDER — THIAMINE HCL 100 MG/ML IJ SOLN
100.0000 mg | Freq: Every day | INTRAMUSCULAR | Status: DC
  Filled 2013-03-28 (×7): qty 1

## 2013-03-28 MED ORDER — ALBUMIN HUMAN 5 % IV SOLN
12.5000 g | Freq: Once | INTRAVENOUS | Status: AC
  Administered 2013-03-28: 12.5 g via INTRAVENOUS
  Filled 2013-03-28: qty 250

## 2013-03-28 MED ORDER — POTASSIUM CHLORIDE 10 MEQ/100ML IV SOLN
10.0000 meq | INTRAVENOUS | Status: AC
  Administered 2013-03-28 (×4): 10 meq via INTRAVENOUS
  Filled 2013-03-28: qty 100

## 2013-03-28 NOTE — Telephone Encounter (Addendum)
Message copied by Fredrich Birks on Fri Mar 28, 2013  9:43 AM ------      Message from: Dara Lords      Created: Wed Mar 26, 2013 11:43 AM       S/p right AKA 03/26/13.  F/u with Dr. Arbie Cookey in 4 weeks...will need staples removed.            Thanks,      Lelon Mast ------  03/28/13: lm for pt re appt, dpm

## 2013-03-28 NOTE — Progress Notes (Signed)
RN called me to bedside d/t pt w/ increased PVC's this afternoon and requested pt to be placed back on vent/full support.  Pt tol well, no resp distress noted.

## 2013-03-28 NOTE — Progress Notes (Signed)
Name: Rodney [name] MRN: [id number] DOB: March 19, 1946    ADMISSION DATE:  03/25/2013 CONSULTATION DATE:  03/28/2013  REFERRING MD :  [facility] PRIMARY SERVICE: PCCM  CHIEF COMPLAINT:   Septic shock  BRIEF PATIENT DESCRIPTION:  67 y.o.AAM with ischemic LE, sepsis, shock, VDRF tfr from [facility] for vascular and CCM care 03/26/13   SIGNIFICANT EVENTS / STUDIES:  12/24: intubated for resp failure at [facility] and tfr to [facility] 12/24: for OR for R AKA  LINES / TUBES: R [place] CVL 12/24 ETT 12/24 R rad art line 12/24>>12/25   CULTURES: BC x 2 12/23>>GNR 2/2 >>   ANTIBIOTICS: 12/23 doxy>>12/24 12/23 vanco12/24 12/23 zosyn 12/24 cipro  SUBJECTIVE:  Did not tol precedex, more hypotension and ectopy on DBA  VITAL SIGNS: Temp:  [97.2 F (36.2 C)-97.6 F (36.4 C)] 97.5 F (36.4 C) (12/26 0317) Pulse Rate:  [33-82] 71 (12/26 0700) Resp:  [13-22] 18 (12/26 0700) BP: (58-140)/(39-75) 110/65 mmHg (12/26 0700) SpO2:  [98 %-100 %] 100 % (12/26 0700) FiO2 (%):  [40 %] 40 % (12/26 0600) Weight:  [64.592 kg (142 lb 6.4 oz)] 64.592 kg (142 lb 6.4 oz) (12/26 0345) HEMODYNAMICS: CVP:  [10 mmHg-14 mmHg] 13 mmHg Levophed    VENTILATOR SETTINGS: Vent Mode:  [-] PRVC FiO2 (%):  [40 %] 40 % Set Rate:  [18 bmp] 18 bmp Vt Set:  [550 mL] 550 mL PEEP:  [5 cmH20] 5 cmH20 Plateau Pressure:  [15 cmH20-21 cmH20] 21 cmH20 INTAKE / OUTPUT: Intake/Output     12/25 0701 - 12/26 0700 12/26 0701 - 12/27 0700   I.V. (mL/kg) 623.7 (9.7)    NG/GT 644.8    IV Piggyback 1700    Total Intake(mL/kg) 2968.6 (46)    Urine (mL/kg/hr) 770 (0.5)    Emesis/NG output 110 (0.1)    Total Output 880     Net +2088.6           UOP poor   PHYSICAL EXAMINATION: General:  Ill appearing AAM in NAD Neuro:  Sedated on vent HEENT:  +JVD   Cardiovascular:  RRR nl s1 s2 no s3 Lungs:  clearer Abdomen:  Soft NT  Musculoskeletal:  R AKA dressing dry, intact Skin: clear  LABS:  CBC  Recent Labs Lab 03/25/13 1240  03/26/13 1049 03/26/13 1055 03/28/13 0500  WBC 9.5  --  32.4* 25.0*  HGB 15.0 12.2* 11.2* 12.0*  HCT 44.2 36.0* 32.4* 33.9*  PLT 157  --  92* 57*   Coag's  Recent Labs Lab 03/26/13 1055  INR 2.12*   BMET  Recent Labs Lab 03/27/13 0430 03/27/13 2335 03/28/13 0500  NA 137 136 135  K 3.1* 3.0* 4.3  CL 103 103 103  CO2 23 23 22   BUN 23 22 23   CREATININE 1.13 1.00 0.99  GLUCOSE 98 110* 153*   Electrolytes  Recent Labs Lab 03/27/13 0430 03/27/13 2335 03/28/13 0500  CALCIUM 7.0* 7.2* 7.0*  MG  --  1.6  --   PHOS  --  2.2*  --    Sepsis Markers  Recent Labs Lab 03/25/13 2312  LATICACIDVEN 9.1*   ABG  Recent Labs Lab 03/25/13 2308 03/26/13 0330 03/26/13 1049  PHART 7.455* 7.422 7.403  PCO2ART 25.8* 30.4* 36.2  PO2ART 359.0* 132.0* 394.0*   Liver Enzymes  Recent Labs Lab 03/25/13 1240 03/26/13 0422  AST 52* 41*  ALT 26 17  ALKPHOS 162* 105  BILITOT 2.2* 1.6*  ALBUMIN 3.0* 1.8*   Cardiac  Enzymes  Recent Labs Lab 03/25/13 1240 03/25/13 1625 03/25/13 2159 03/26/13 0422  TROPONINI <0.30 <0.30 <0.30 0.70*  PROBNP 14339.0*  --   --   --    Glucose  Recent Labs Lab 03/27/13 0730 03/27/13 1143 03/27/13 1632 03/27/13 1945 03/28/13 0003 03/28/13 0307  GLUCAP 81 85 77 96 117* 133*    Imaging Dg Chest Port 1 View  03/27/2013   CLINICAL DATA:  Respiratory distress  EXAM: PORTABLE CHEST - 1 VIEW  COMPARISON:  03/26/2013  FINDINGS: Nasogastric tube has retracted now lying with its tip projected near the tip of the endotracheal tube. This is likely in the upper thoracic esophagus.  Endotracheal tube and right subclavian central venous line are stable in well positioned.  Cardiac silhouette is mildly enlarged. There is mild lung base opacity bilaterally likely combination of small effusions and atelectasis. This is greater on the left. No overt pulmonary edema. No pneumothorax.  IMPRESSION: Endotracheal tube has retracted. The tip now projects  in the upper thoracic esophagus. It will need to be repositioned.  No other change from the previous day's study.   Electronically Signed   By: Amie Portland M.D.   On: 03/27/2013 08:27   Dg Abd Portable 1v  03/27/2013   CLINICAL DATA:  Orogastric tube placement  EXAM: PORTABLE ABDOMEN - 1 VIEW  COMPARISON:  None.  FINDINGS: Orogastric tube tip and side port are in the stomach. The bowel gas pattern is unremarkable. No free air seen on this supine examination. There is left lower lobe consolidation.  IMPRESSION: Orogastric tube tip and side port in stomach. Visualized bowel gas unremarkable. Left lower lobe consolidation.   Electronically Signed   By: Bretta Bang M.D.   On: 03/27/2013 15:20     CXR: 12/26  Mild edema CM  ETT ok Echo: not done yet   ASSESSMENT / PLAN:  PULMONARY A:VDRF, pulm edema, RLL CAP, septic shock d/t vascular ischemia Acute resp failure  P:   Full vent until off pressors Wua/sbt BDs   CARDIOVASCULAR A: septic shock>>persisting. prob cardiac component. Echo not done CHF need to define. ?alch CM Severe PVD s/p R AKA Back on vasopressors  P:  Per VVS Try milronone, failed DBA F/u echo>>reorder  chk cortisol Add solucortef  RENAL A:  AKI>>SCr stable Oliguria hypokalemia P:   Monitor Replete K Give more volume with albumen  GASTROINTESTINAL A:  Severe protein cal malnutrition P:   Monitor lft ppi sup Start TF  HEMATOLOGIC A:  No acute issues P:  monitor  INFECTIOUS A:  Sepsis septic shock>>persists.  GNR bacteremia P:   See dashboard>>abx narrowed  ENDOCRINE A:  No issues   P:   SSI  NEUROLOGIC A: ETOH WD, acute delirium>>improved AM 12/26 P:   Prn versed, fentanyl.  Failed precedex with hypotension Pain control  TODAY'S SUMMARY:  67 y.o.M with ischemic LE, septic shock. VDRF.  GNR bacteremia, s/p AKA   Plan slowwean pressors, cont TF, start milronone , add solu cortef, cont aBX  wean vent as able.    I have  personally obtained a history, examined the patient, evaluated laboratory and imaging results, formulated the assessment and plan and placed orders. CRITICAL CARE: The patient is critically ill with multiple organ systems failure and requires high complexity decision making for assessment and support, frequent evaluation and titration of therapies, application of advanced monitoring technologies and extensive interpretation of multiple databases. Critical Care Time devoted to patient care services described in this note  is 40  minutes.   Dorcas Carrow Beeper  [phone]  Cell  [phone]  If no response or cell goes to voicemail, call beeper [phone]  Pulmonary and Critical Care Medicine [facility] Pager: [phone]  03/28/2013, 7:23 AM

## 2013-03-28 NOTE — Progress Notes (Signed)
CSW received referral for ETOH. CSW reviewed chart and patient is on vent. CSW will assess patient when he is medically more stable. CSW is standing by at this time.  Maree Krabbe, MSW, [facility] [phone]

## 2013-03-28 NOTE — Significant Event (Addendum)
Positive BC identified as E coli, pansensitive. Cont Zosyn. DC ciprofloxacin   Billy Fischer, MD ; [facility] [phone].  After 5:30 PM or weekends, call [phone]

## 2013-03-28 NOTE — Progress Notes (Signed)
  Echocardiogram 2D Echocardiogram has been performed.  Georgian Co 03/28/2013, 8:51 AM
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
## 2013-03-28 NOTE — Progress Notes (Addendum)
   Daily Progress Note  Assessment/Planning: POD #2 s/p R AKA, L foot ischemia   Pt off pressors but still on vent with poor mental status  LLE ABI  If L foot loses signal, I would not try to salvage left leg, I would proceed with a L AKA.  I will be off this coming week, Dr. Edilia Bo will be covering for me over the weekend.  Subjective  - 2 Days Post-Op  Intubated and non-responsive  Objective Filed Vitals:   03/28/13 0615 03/28/13 0630 03/28/13 0645 03/28/13 0700  BP: 97/70 113/67 109/65 110/65  Pulse: 71 72 71 71  Temp:      TempSrc:      Resp: 18 18 18 18   Height:      Weight:      SpO2: 100% 100% 100% 100%    Intake/Output Summary (Last 24 hours) at 03/28/13 0735 Last data filed at 03/28/13 0700  Gross per 24 hour  Intake 2918.55 ml  Output    880 ml  Net 2038.55 ml    VASC  R AKA viable  Laboratory CBC    Component Value Date/Time   WBC 25.0* 03/28/2013 0500   HGB 12.0* 03/28/2013 0500   HCT 33.9* 03/28/2013 0500   PLT 57* 03/28/2013 0500    BMET    Component Value Date/Time   NA 135 03/28/2013 0500   K 4.3 03/28/2013 0500   CL 103 03/28/2013 0500   CO2 22 03/28/2013 0500   GLUCOSE 153* 03/28/2013 0500   BUN 23 03/28/2013 0500   CREATININE 0.99 03/28/2013 0500   CALCIUM 7.0* 03/28/2013 0500   GFRNONAA 82* 03/28/2013 0500   GFRAA >90 03/28/2013 0500    Leonides Sake, MD [facility] [place] Office: [phone] Pager: [phone]  03/28/2013, 7:35 AM

## 2013-03-28 NOTE — Progress Notes (Addendum)
eLink RN/Dr [employer] updated pt status. Updated pt on cpap majority of shift, no IV sedation, pt calm. Pt SR with frequent, multifocal PVC, per [facility] RN MD stated to put pt back on full vent support. Updated marginal urine output, urine now also slightly blood tinged. Will continue to monitor. Koren Bound

## 2013-03-28 NOTE — Progress Notes (Signed)
CRITICAL VALUE ALERT  Critical value received:  Anerobic & Aerobic Gram negative rods  Date of notification:  03/28/13  Time of notification:  0725  Critical value read back:yes  Nurse who received alert:  Carlos Levering   MD notified (1st page):  MD Delford Field at bedside made aware

## 2013-03-29 ENCOUNTER — Inpatient Hospital Stay (HOSPITAL_COMMUNITY): Payer: Medicare Other

## 2013-03-29 ENCOUNTER — Encounter (HOSPITAL_COMMUNITY): Payer: Self-pay | Admitting: Cardiology

## 2013-03-29 LAB — CBC WITH DIFFERENTIAL/PLATELET
Basophils Absolute: 0 10*3/uL (ref 0.0–0.1)
Eosinophils Absolute: 0 10*3/uL (ref 0.0–0.7)
Eosinophils Relative: 0 % (ref 0–5)
Hemoglobin: 10.1 g/dL — ABNORMAL LOW (ref 13.0–17.0)
Lymphs Abs: 0.6 10*3/uL — ABNORMAL LOW (ref 0.7–4.0)
MCH: 29.5 pg (ref 26.0–34.0)
MCV: 84.8 fL (ref 78.0–100.0)
Neutro Abs: 16.2 10*3/uL — ABNORMAL HIGH (ref 1.7–7.7)
Neutrophils Relative %: 94 % — ABNORMAL HIGH (ref 43–77)
Platelets: 52 10*3/uL — ABNORMAL LOW (ref 150–400)
RDW: 16.6 % — ABNORMAL HIGH (ref 11.5–15.5)
WBC: 17.3 10*3/uL — ABNORMAL HIGH (ref 4.0–10.5)

## 2013-03-29 LAB — BASIC METABOLIC PANEL
CO2: 22 mEq/L (ref 19–32)
Calcium: 7.2 mg/dL — ABNORMAL LOW (ref 8.4–10.5)
Creatinine, Ser: 0.91 mg/dL (ref 0.50–1.35)
GFR calc non Af Amer: 85 mL/min — ABNORMAL LOW (ref >90)
Potassium: 3.3 mEq/L — ABNORMAL LOW (ref 3.5–5.1)

## 2013-03-29 LAB — GLUCOSE, CAPILLARY

## 2013-03-29 LAB — MAGNESIUM: Magnesium: 2.3 mg/dL (ref 1.5–2.5)

## 2013-03-29 MED ORDER — FUROSEMIDE 10 MG/ML IJ SOLN
40.0000 mg | Freq: Once | INTRAMUSCULAR | Status: AC
  Administered 2013-03-29: 40 mg via INTRAVENOUS
  Filled 2013-03-29: qty 4

## 2013-03-29 MED ORDER — POTASSIUM CHLORIDE 20 MEQ/15ML (10%) PO LIQD
40.0000 meq | Freq: Once | ORAL | Status: AC
  Administered 2013-03-29: 40 meq
  Filled 2013-03-29: qty 30

## 2013-03-29 MED ORDER — BIOTENE DRY MOUTH MT LIQD
15.0000 mL | Freq: Two times a day (BID) | OROMUCOSAL | Status: DC
  Administered 2013-03-29 – 2013-04-07 (×18): 15 mL via OROMUCOSAL

## 2013-03-29 MED ORDER — ATORVASTATIN CALCIUM 20 MG PO TABS
20.0000 mg | ORAL_TABLET | Freq: Every day | ORAL | Status: DC
  Administered 2013-03-30 – 2013-04-01 (×3): 20 mg via ORAL
  Filled 2013-03-29 (×6): qty 1

## 2013-03-29 MED ORDER — PANTOPRAZOLE SODIUM 40 MG IV SOLR
40.0000 mg | INTRAVENOUS | Status: DC
  Administered 2013-03-29 – 2013-04-01 (×4): 40 mg via INTRAVENOUS
  Filled 2013-03-29 (×5): qty 40

## 2013-03-29 MED ORDER — FUROSEMIDE 10 MG/ML IJ SOLN
40.0000 mg | Freq: Two times a day (BID) | INTRAMUSCULAR | Status: DC
  Administered 2013-03-29 – 2013-03-31 (×4): 40 mg via INTRAVENOUS
  Filled 2013-03-29 (×6): qty 4

## 2013-03-29 MED ORDER — SODIUM CHLORIDE 0.9 % IV SOLN
250.0000 mL | INTRAVENOUS | Status: DC | PRN
  Administered 2013-03-30: 20 mL/h via INTRAVENOUS

## 2013-03-29 NOTE — Progress Notes (Addendum)
Name: Rodney [name] MRN: [id number] DOB: 02/08/1946    ADMISSION DATE:  03/25/2013 CONSULTATION DATE:  03/29/2013  REFERRING MD :  [facility] PRIMARY SERVICE: PCCM  CHIEF COMPLAINT:   Septic shock  BRIEF PATIENT DESCRIPTION:  67 y.o.AAM with ischemic LE, sepsis, shock, VDRF tfr from [facility] for vascular and CCM care 03/26/13   SIGNIFICANT EVENTS / STUDIES:  12/24: intubated for resp failure at [facility] and [facility]fr to [facility] 12/24: for OR for R AKA  LINES / TUBES: R [place] CVL 12/24 ETT 12/24 R rad art line 12/24>>12/25   CULTURES: BC x 2 12/23>>GNR 2/2 >>ecoli   ANTIBIOTICS: 12/23 doxy>>12/24 12/23 vanco12/24 12/23 zosyn 12/24 cipro>>12/26  SUBJECTIVE:  More alert. Off levophed. tol milrinone well VITAL SIGNS: Temp:  [97.4 F (36.3 C)-98.5 F (36.9 C)] 98.5 F (36.9 C) (12/27 0730) Pulse Rate:  [36-88] 84 (12/27 1000) Resp:  [14-34] 34 (12/27 1000) BP: (88-121)/(46-73) 103/53 mmHg (12/27 1000) SpO2:  [95 %-100 %] 100 % (12/27 1000) FiO2 (%):  [40 %] 40 % (12/27 0800) Weight:  [65.1 kg (143 lb 8.3 oz)] 65.1 kg (143 lb 8.3 oz) (12/27 0500) HEMODYNAMICS: CVP:  [13 mmHg] 13 mmHg On milrinone only   VENTILATOR SETTINGS: Vent Mode:  [-] PSV FiO2 (%):  [40 %] 40 % Set Rate:  [18 bmp] 18 bmp Vt Set:  [550 mL] 550 mL PEEP:  [5 cmH20] 5 cmH20 Pressure Support:  [5 cmH20-10 cmH20] 5 cmH20 Plateau Pressure:  [19 cmH20-20 cmH20] 20 cmH20 INTAKE / OUTPUT: Intake/Output     12/26 0701 - 12/27 0700 12/27 0701 - 12/28 0700   I.V. (mL/kg) 673.1 (10.3) 59.4 (0.9)   NG/GT 1430 195   IV Piggyback 600 50   Total Intake(mL/kg) 2703.1 (41.5) 304.4 (4.7)   Urine (mL/kg/hr) 885 (0.6) 60 (0.2)   Emesis/NG output     Total Output 885 60   Net +1818.1 +244.4        Stool Occurrence 2 x        PHYSICAL EXAMINATION: General:  Ill appearing AAM in NAD Neuro:  RASS 0 on vent HEENT:  +JVD   Cardiovascular:  RRR nl s1 s2 no s3 Lungs:  clearer Abdomen:  Soft NT  Musculoskeletal:  R AKA  dressing dry, intact Skin: clear  LABS:  CBC  Recent Labs Lab 03/26/13 1055 03/28/13 0500 03/29/13 0425  WBC 32.4* 25.0* 17.3*  HGB 11.2* 12.0* 10.1*  HCT 32.4* 33.9* 29.0*  PLT 92* 57* 52*   Coag's  Recent Labs Lab 03/26/13 1055  INR 2.12*   BMET  Recent Labs Lab 03/27/13 2335 03/28/13 0500 03/29/13 0425  NA 136 135 134*  K 3.0* 4.3 3.3*  CL 103 103 102  CO2 23 22 22   BUN 22 23 29*  CREATININE 1.00 0.99 0.91  GLUCOSE 110* 153* 124*   Electrolytes  Recent Labs Lab 03/27/13 2335 03/28/13 0500 03/29/13 0425  CALCIUM 7.2* 7.0* 7.2*  MG 1.6  --  2.3  PHOS 2.2*  --  2.2*   Sepsis Markers  Recent Labs Lab 03/25/13 2312  LATICACIDVEN 9.1*   ABG  Recent Labs Lab 03/25/13 2308 03/26/13 0330 03/26/13 1049  PHART 7.455* 7.422 7.403  PCO2ART 25.8* 30.4* 36.2  PO2ART 359.0* 132.0* 394.0*   Liver Enzymes  Recent Labs Lab 03/25/13 1240 03/26/13 0422  AST 52* 41*  ALT 26 17  ALKPHOS 162* 105  BILITOT 2.2* 1.6*  ALBUMIN 3.0* 1.8*   Cardiac Enzymes  Recent Labs  Lab 03/25/13 1240 03/25/13 1625 03/25/13 2159 03/26/13 0422  TROPONINI <0.30 <0.30 <0.30 0.70*  PROBNP 14339.0*  --   --   --    Glucose  Recent Labs Lab 03/28/13 0307 03/28/13 0745 03/28/13 1147 03/28/13 1535 03/28/13 1954 03/29/13 0728  GLUCAP 133* 121* 119* 111* 119* 101*    Imaging Dg Chest Port 1 View  03/29/2013   CLINICAL DATA:  Shock and respiratory failure and right lower extremity gangrene. , history of CHF  EXAM: PORTABLE CHEST - 1 VIEW  COMPARISON:  Chest x-ray of March 28, 2013.  FINDINGS: The lungs are adequately inflated. The left hemidiaphragm remains obscured in the retrocardiac region on the left remains dense. The cardiopericardial silhouette is mildly enlarged. The pulmonary vascularity is engorged. The pulmonary interstitial markings however less conspicuous today. There is a right subclavian venous catheter in place with the tip in the region of  the proximal to mid SVC. The endotracheal tube tip lies lies 3.2 cm above the crotch of the carina. The esophagogastric tube tip and proximal port lies in the region of the distal portion of the stomach.  IMPRESSION: 1. There is improved appearance of the pulmonary interstitium bilaterally. There remains left lower lobe atelectasis and a possible small left pleural effusion. There is no pneumothorax. 2. The cardiopericardial silhouette remains mildly enlarged. The pulmonary vascularity is prominent centrally but stable. 3. The support tubes and lines appear to be in appropriate position.   Electronically Signed   By: David  [place]   On: 03/29/2013 09:14   Dg Chest Port 1 View  03/28/2013   CLINICAL DATA:  Respiratory failure, pneumonia  EXAM: PORTABLE CHEST - 1 VIEW  COMPARISON:  03/27/2013; 03/25/2013  FINDINGS: Grossly unchanged enlarged cardiac silhouette and mediastinal contours. Atherosclerotic plaque within the thoracic aorta. Interval advancement of enteric tube with tip and side port now projecting below the left hemidiaphragm. Otherwise, stable positioning of remaining support apparatus. A skin fold overlies the peripheral aspect of the right upper lung. No definite pneumothorax. Pulmonary vasculatures appears less distinct on the present examination with cephalization of flow. There is a minimal amount of fluid tracking within the right minor fissure. Grossly unchanged perihilar and left basilar/retrocardiac opacities. Trace bilateral effusions are not excluded. Unchanged bones.  IMPRESSION: 1. Appropriately positioned support apparatus as above. No pneumothorax. 2. Suspected worsening pulmonary edema. 3. Grossly unchanged perihilar and left basilar opacities, atelectasis versus infiltrate.   Electronically Signed   By: Simonne Come M.D.   On: 03/28/2013 08:13   Dg Abd Portable 1v  03/27/2013   CLINICAL DATA:  Orogastric tube placement  EXAM: PORTABLE ABDOMEN - 1 VIEW  COMPARISON:  None.  FINDINGS:  Orogastric tube tip and side port are in the stomach. The bowel gas pattern is unremarkable. No free air seen on this supine examination. There is left lower lobe consolidation.  IMPRESSION: Orogastric tube tip and side port in stomach. Visualized bowel gas unremarkable. Left lower lobe consolidation.   Electronically Signed   By: Bretta Bang M.D.   On: 03/27/2013 15:20     CXR: 12/27  Mild edema CM  ETT ok Echo: EF 15%  ASSESSMENT / PLAN:  PULMONARY A:VDRF, pulm edema, RLL CAP, septic shock d/t vascular ischemia Acute resp failure  P:   Extubate 12/27 BDs   CARDIOVASCULAR A: septic shock>>persisting. prob cardiac component.  CHF acute systolic EF 15% Severe PVD s/p R AKA cortisol 18 Cardiology consulted 12/26 has not seen the pt as of yet  P:  Per VVS Cont milronone, failed DBA D/c hydrocortisone with normal cortisol  RENAL A:  AKI>>resolved  hypokalemia P:   Monitor Replete K  GASTROINTESTINAL A:  Severe protein cal malnutrition P:   Monitor lft ppi sup D/c tf with extubation and adv diet as able  HEMATOLOGIC A:  No acute issues P:  monitor  INFECTIOUS A:  Sepsis septic shock>>persists.  E coli  bacteremia P:   See dashboard>>abx narrowed to zosyn alone   ENDOCRINE A:  No issues   P:   SSI  NEUROLOGIC A: ETOH WD, acute delirium>>improved AM 12/27 P:   Prn fentanyl after extubation.  Failed precedex with hypotension Pain control  TODAY'S SUMMARY:  67 y.o.M with ischemic LE, septic shock. VDRF.  GNR bacteremia, s/p AKA   Improved 12/27 Plan extubate, cont milrinone, lasix.  I have personally obtained a history, examined the patient, evaluated laboratory and imaging results, formulated the assessment and plan and placed orders. CRITICAL CARE: The patient is critically ill with multiple organ systems failure and requires high complexity decision making for assessment and support, frequent evaluation and titration of therapies, application of  advanced monitoring technologies and extensive interpretation of multiple databases. Critical Care Time devoted to patient care services described in this note is 40  minutes.   Dorcas Carrow Beeper  [phone]  Cell  [phone]  If no response or cell goes to voicemail, call beeper [phone]  Pulmonary and Critical Care Medicine [facility] Pager: [phone]  03/29/2013, 11:44 AM

## 2013-03-29 NOTE — Procedures (Signed)
Extubation Procedure Note  Patient Details:   Name: Rodney [name] DOB: [birth year] MRN: [id number]   Airway Documentation:     Evaluation  O2 sats: stable throughout Complications: No apparent complications Patient did tolerate procedure well. Bilateral Breath Sounds: Clear;Diminished Suctioning: Oral;Airway Yes  Pt tolerated wean, positive for cuff leak, extubated to 4lpm [place]. No stridor or dyspnea noted after extubation. Pt resting comfortably and all vitals are within normal limits. Pt achieved around IS x2. RT will continue to monitor.   Beatris Si 03/29/2013, 12:13 PM

## 2013-03-29 NOTE — Consult Note (Signed)
HPI: 67 year-old male for evaluation of cardiomyopathy and acute systolic congestive heart failure. Admitted December 23 with an ischemic right foot. Patient developed progressive hypotension and sepsis and was transferred to [facility]. 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 He required intubation but was extubated earlier today. Echocardiogram performed for congestive heart failure and his ejection fraction is 15% with significant valvular disease. Cardiology is asked to evaluate. Patient denies dyspnea, chest pain, palpitations or syncope. He has a long history of alcohol abuse.  Medications Prior to Admission  Medication Sig Dispense Refill  . aspirin 81 MG chewable tablet Chew 81-162 mg by mouth daily as needed for moderate pain.        No Known Allergies  Past Medical History  Diagnosis Date  . HTN (hypertension)   . Smoking hx 03/25/2013  . Alcohol abuse 03/25/2013  . CHF (congestive heart failure) 03/25/2013    Past Surgical History  Procedure Laterality Date  . Above knee leg amputation Right     History   Social History  . Marital Status: Single    Spouse Name: N/A    Number of Children: N/A  . Years of Education: N/A   Occupational History  . Not on file.   Social History Main Topics  . Smoking status: Current Every Day Smoker    Types: Cigarettes  . Smokeless tobacco: Not on file  . Alcohol Use: Yes     Comment: everyday  . Drug Use: Yes    Special: Marijuana  . Sexual Activity: No   Other Topics Concern  . Not on file   Social History Narrative  . No narrative on file    No family history on file.  ROS:  no fevers or chills, productive cough, hemoptysis, dysphasia, odynophagia, melena, hematochezia, dysuria, hematuria, rash, seizure activity, orthopnea, PND, claudication. Remaining systems are negative.  Physical Exam:   Blood pressure 119/59, pulse 89, temperature 97.6 F (36.4 C), temperature source Oral, resp. rate 18, height 5\' 8"   (1.727 m), weight 143 lb 8.3 oz (65.1 kg), SpO2 100.00%.  General:  Well developed/well nourished in NAD Skin warm/dry Patient not depressed No peripheral clubbing Back-normal HEENT-edentulous Neck supple/normal carotid upstroke bilaterally; no bruits; no JVD; no thyromegaly chest - CTA/ normal expansion CV - RRR/normal S1 and S2; no rubs or gallops;  2/6 systolic murmur left sternal border and apex. Abdomen -NT/ND, no HSM, no mass, + bowel sounds, no bruit, positive presacral edema 1+ femoral pulses, no bruits Ext-status post right AKA. DP pulse on the left not palpable. 2+ thigh edema on the left. Neuro-grossly nonfocal  ECG sinus rhythm, PVCs, cannot rule out prior anterior infarct, left ventricular hypertrophy with repolarization abnormality.  Results for orders placed during the hospital encounter of 03/25/13 (from the past 48 hour(s))  GLUCOSE, CAPILLARY     Status: None   Collection Time    03/27/13  4:32 PM      Result Value Range   Glucose-Capillary 77  70 - 99 mg/dL  GLUCOSE, CAPILLARY     Status: None   Collection Time    03/27/13  7:45 PM      Result Value Range   Glucose-Capillary 96  70 - 99 mg/dL  BASIC METABOLIC PANEL     Status: Abnormal   Collection Time    03/27/13 11:35 PM      Result Value Range   Sodium 136  135 - 145 mEq/L   Potassium 3.0 (*) 3.5 - 5.1  mEq/L   Chloride 103  96 - 112 mEq/L   CO2 23  19 - 32 mEq/L   Glucose, Bld 110 (*) 70 - 99 mg/dL   BUN 22  6 - 23 mg/dL   Creatinine, Ser 1.61  0.50 - 1.35 mg/dL   Calcium 7.2 (*) 8.4 - 10.5 mg/dL   GFR calc non Af Amer 75 (*) >90 mL/min   GFR calc Af Amer 87 (*) >90 mL/min   Comment: (NOTE)     The eGFR has been calculated using the CKD EPI equation.     This calculation has not been validated in all clinical situations.     eGFR's persistently <90 mL/min signify possible Chronic Kidney     Disease.  PHOSPHORUS     Status: Abnormal   Collection Time    03/27/13 11:35 PM      Result Value Range    Phosphorus 2.2 (*) 2.3 - 4.6 mg/dL  MAGNESIUM     Status: None   Collection Time    03/27/13 11:35 PM      Result Value Range   Magnesium 1.6  1.5 - 2.5 mg/dL  GLUCOSE, CAPILLARY     Status: Abnormal   Collection Time    03/28/13 12:03 AM      Result Value Range   Glucose-Capillary 117 (*) 70 - 99 mg/dL  GLUCOSE, CAPILLARY     Status: Abnormal   Collection Time    03/28/13  3:07 AM      Result Value Range   Glucose-Capillary 133 (*) 70 - 99 mg/dL  BASIC METABOLIC PANEL     Status: Abnormal   Collection Time    03/28/13  5:00 AM      Result Value Range   Sodium 135  135 - 145 mEq/L   Potassium 4.3  3.5 - 5.1 mEq/L   Comment: DELTA CHECK NOTED   Chloride 103  96 - 112 mEq/L   CO2 22  19 - 32 mEq/L   Glucose, Bld 153 (*) 70 - 99 mg/dL   BUN 23  6 - 23 mg/dL   Creatinine, Ser 0.96  0.50 - 1.35 mg/dL   Calcium 7.0 (*) 8.4 - 10.5 mg/dL   GFR calc non Af Amer 82 (*) >90 mL/min   GFR calc Af Amer >90  >90 mL/min   Comment: (NOTE)     The eGFR has been calculated using the CKD EPI equation.     This calculation has not been validated in all clinical situations.     eGFR's persistently <90 mL/min signify possible Chronic Kidney     Disease.  CBC     Status: Abnormal   Collection Time    03/28/13  5:00 AM      Result Value Range   WBC 25.0 (*) 4.0 - 10.5 K/uL   RBC 3.94 (*) 4.22 - 5.81 MIL/uL   Hemoglobin 12.0 (*) 13.0 - 17.0 g/dL   HCT 04.5 (*) 40.9 - 81.1 %   MCV 86.0  78.0 - 100.0 fL   MCH 30.5  26.0 - 34.0 pg   MCHC 35.4  30.0 - 36.0 g/dL   RDW 91.4 (*) 78.2 - 95.6 %   Platelets 57 (*) 150 - 400 K/uL   Comment: REPEATED TO VERIFY     PLATELET COUNT CONFIRMED BY SMEAR     DELTA CHECK NOTED  GLUCOSE, CAPILLARY     Status: Abnormal   Collection Time    03/28/13  7:45 AM  Result Value Range   Glucose-Capillary 121 (*) 70 - 99 mg/dL   Comment 1 Notify RN    CORTISOL     Status: None   Collection Time    03/28/13  8:00 AM      Result Value Range   Cortisol,  Plasma 18.2     Comment: (NOTE)     AM:  4.3 - 22.4 ug/dL     PM:  3.1 - 16.1 ug/dL     Performed at [facility]  GLUCOSE, CAPILLARY     Status: Abnormal   Collection Time    03/28/13 11:47 AM      Result Value Range   Glucose-Capillary 119 (*) 70 - 99 mg/dL   Comment 1 Notify RN    GLUCOSE, CAPILLARY     Status: Abnormal   Collection Time    03/28/13  3:35 PM      Result Value Range   Glucose-Capillary 111 (*) 70 - 99 mg/dL   Comment 1 Notify RN    GLUCOSE, CAPILLARY     Status: Abnormal   Collection Time    03/28/13  7:54 PM      Result Value Range   Glucose-Capillary 119 (*) 70 - 99 mg/dL  BASIC METABOLIC PANEL     Status: Abnormal   Collection Time    03/29/13  4:25 AM      Result Value Range   Sodium 134 (*) 135 - 145 mEq/L   Potassium 3.3 (*) 3.5 - 5.1 mEq/L   Comment: DELTA CHECK NOTED   Chloride 102  96 - 112 mEq/L   CO2 22  19 - 32 mEq/L   Glucose, Bld 124 (*) 70 - 99 mg/dL   BUN 29 (*) 6 - 23 mg/dL   Creatinine, Ser 0.96  0.50 - 1.35 mg/dL   Calcium 7.2 (*) 8.4 - 10.5 mg/dL   GFR calc non Af Amer 85 (*) >90 mL/min   GFR calc Af Amer >90  >90 mL/min   Comment: (NOTE)     The eGFR has been calculated using the CKD EPI equation.     This calculation has not been validated in all clinical situations.     eGFR's persistently <90 mL/min signify possible Chronic Kidney     Disease.  CBC WITH DIFFERENTIAL     Status: Abnormal   Collection Time    03/29/13  4:25 AM      Result Value Range   WBC 17.3 (*) 4.0 - 10.5 K/uL   RBC 3.42 (*) 4.22 - 5.81 MIL/uL   Hemoglobin 10.1 (*) 13.0 - 17.0 g/dL   HCT 04.5 (*) 40.9 - 81.1 %   MCV 84.8  78.0 - 100.0 fL   MCH 29.5  26.0 - 34.0 pg   MCHC 34.8  30.0 - 36.0 g/dL   RDW 91.4 (*) 78.2 - 95.6 %   Platelets 52 (*) 150 - 400 K/uL   Comment: REPEATED TO VERIFY     PLATELET COUNT CONFIRMED BY SMEAR   Neutrophils Relative % 94 (*) 43 - 77 %   Neutro Abs 16.2 (*) 1.7 - 7.7 K/uL   Lymphocytes Relative 3 (*) 12 - 46 %     Lymphs Abs 0.6 (*) 0.7 - 4.0 K/uL   Monocytes Relative 3  3 - 12 %   Monocytes Absolute 0.5  0.1 - 1.0 K/uL   Eosinophils Relative 0  0 - 5 %   Eosinophils Absolute 0.0  0.0 - 0.7  K/uL   Basophils Relative 0  0 - 1 %   Basophils Absolute 0.0  0.0 - 0.1 K/uL  MAGNESIUM     Status: None   Collection Time    03/29/13  4:25 AM      Result Value Range   Magnesium 2.3  1.5 - 2.5 mg/dL  PHOSPHORUS     Status: Abnormal   Collection Time    03/29/13  4:25 AM      Result Value Range   Phosphorus 2.2 (*) 2.3 - 4.6 mg/dL  GLUCOSE, CAPILLARY     Status: Abnormal   Collection Time    03/29/13  7:28 AM      Result Value Range   Glucose-Capillary 101 (*) 70 - 99 mg/dL   Comment 1 Notify RN    GLUCOSE, CAPILLARY     Status: Abnormal   Collection Time    03/29/13 11:58 AM      Result Value Range   Glucose-Capillary 119 (*) 70 - 99 mg/dL   Comment 1 Notify RN      Dg Chest Port 1 View  03/29/2013   CLINICAL DATA:  Shock and respiratory failure and right lower extremity gangrene. , history of CHF  EXAM: PORTABLE CHEST - 1 VIEW  COMPARISON:  Chest x-ray of March 28, 2013.  FINDINGS: The lungs are adequately inflated. The left hemidiaphragm remains obscured in the retrocardiac region on the left remains dense. The cardiopericardial silhouette is mildly enlarged. The pulmonary vascularity is engorged. The pulmonary interstitial markings however less conspicuous today. There is a right subclavian venous catheter in place with the tip in the region of the proximal to mid SVC. The endotracheal tube tip lies lies 3.2 cm above the crotch of the carina. The esophagogastric tube tip and proximal port lies in the region of the distal portion of the stomach.  IMPRESSION: 1. There is improved appearance of the pulmonary interstitium bilaterally. There remains left lower lobe atelectasis and a possible small left pleural effusion. There is no pneumothorax. 2. The cardiopericardial silhouette remains mildly  enlarged. The pulmonary vascularity is prominent centrally but stable. 3. The support tubes and lines appear to be in appropriate position.   Electronically Signed   By: David  [place]   On: 03/29/2013 09:14   Dg Chest Port 1 View  03/28/2013   CLINICAL DATA:  Respiratory failure, pneumonia  EXAM: PORTABLE CHEST - 1 VIEW  COMPARISON:  03/27/2013; 03/25/2013  FINDINGS: Grossly unchanged enlarged cardiac silhouette and mediastinal contours. Atherosclerotic plaque within the thoracic aorta. Interval advancement of enteric tube with tip and side port now projecting below the left hemidiaphragm. Otherwise, stable positioning of remaining support apparatus. A skin fold overlies the peripheral aspect of the right upper lung. No definite pneumothorax. Pulmonary vasculatures appears less distinct on the present examination with cephalization of flow. There is a minimal amount of fluid tracking within the right minor fissure. Grossly unchanged perihilar and left basilar/retrocardiac opacities. Trace bilateral effusions are not excluded. Unchanged bones.  IMPRESSION: 1. Appropriately positioned support apparatus as above. No pneumothorax. 2. Suspected worsening pulmonary edema. 3. Grossly unchanged perihilar and left basilar opacities, atelectasis versus infiltrate.   Electronically Signed   By: Simonne Come M.D.   On: 03/28/2013 08:13   Dg Abd Portable 1v  03/27/2013   CLINICAL DATA:  Orogastric tube placement  EXAM: PORTABLE ABDOMEN - 1 VIEW  COMPARISON:  None.  FINDINGS: Orogastric tube tip and side port are in the stomach. The bowel gas  pattern is unremarkable. No free air seen on this supine examination. There is left lower lobe consolidation.  IMPRESSION: Orogastric tube tip and side port in stomach. Visualized bowel gas unremarkable. Left lower lobe consolidation.   Electronically Signed   By: Bretta Bang M.D.   On: 03/27/2013 15:20    Assessment/Plan 1 acute systolic congestive heart failure-the  patient's echocardiogram shows severe LV dysfunction. There is moderate to severe aortic and mitral regurgitation and severe tricuspid regurgitation. Etiology of his cardiomyopathy is unclear. 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 He may also have coronary disease particularly in light of his peripheral vascular disease. He has been hypotensive most likely secondary to septic shock from his ischemic leg. He is noted to have Escherichia coli in his blood. He has now been weaned off of Levophed. I agree with continuing milrinone. He is volume overloaded on examination. Begin Lasix and follow renal function. If his blood pressure is stable in the morning we will add low-dose ACE inhibitor and titrate as tolerated. Once his CHF improves we will add low-dose beta-blockade. 2 cardiomyopathy-etiology unclear. Question alcohol versus coronary disease. He will ultimately require cardiac catheterization. I would like for his sepsis to improve and to make sure that he is not going to withdrawal from alcohol before proceeding. Note he also has very poor nutrition. 3 peripheral vascular disease-status post right AKA. He will most likely require revascularization of his left lower extremity. He will need a cardiac catheterization prior to proceeding. In the meantime treat with aspirin and statin. 4 sepsis-continue antibiotics. 5 alcohol abuse-watch for withdrawal.  Olga Millers MD 03/29/2013, 1:19 PM

## 2013-03-29 NOTE — Progress Notes (Signed)
ANTIBIOTIC CONSULT NOTE - Follow up  Pharmacy Consult for zosyn Indication: E.coli bacteremia   No Known Allergies  Patient Measurements: Height: 5\' 8"  (172.7 cm) Weight: 143 lb 8.3 oz (65.1 kg) IBW/kg (Calculated) : 68.4 Adjusted Body Weight:   Vital Signs: Temp: 97.6 F (36.4 C) (12/27 1200) Temp src: Oral (12/27 1200) BP: 119/59 mmHg (12/27 1200) Pulse Rate: 89 (12/27 1200) Intake/Output from previous day: 12/26 0701 - 12/27 0700 In: 2703.1 [I.V.:673.1; NG/GT:1430; IV Piggyback:600] Out: 885 [Urine:885] Intake/Output from this shift: Total I/O In: 304.4 [I.V.:59.4; NG/GT:195; IV Piggyback:50] Out: 60 [Urine:60]  Labs:  Recent Labs  03/27/13 2335 03/28/13 0500 03/29/13 0425  WBC  --  25.0* 17.3*  HGB  --  12.0* 10.1*  PLT  --  57* 52*  CREATININE 1.00 0.99 0.91   Estimated Creatinine Clearance: 71.5 ml/min (by C-G formula based on Cr of 0.91). No results found for this basename: VANCOTROUGH, VANCOPEAK, VANCORANDOM, GENTTROUGH, GENTPEAK, GENTRANDOM, TOBRATROUGH, TOBRAPEAK, TOBRARND, AMIKACINPEAK, AMIKACINTROU, AMIKACIN,  in the last 72 hours   Assessment: 55 YOM with ischemic foot s/p right AKA (POD #3) on zosyn for E.coli bacteremia that is susceptible to rocephin, cefepime, cipro, zosyn imipenem, gent, tobra and bactrim. WBC trending down and patient is currently afebrile. CrCl ~ 71.5 mL/min.   12/23: Vanc>>12/24 12/23: Zosyn>> 12/23: Doxy>>12/24 12/24: CIpro>> 12/26  12/23 Blood>> 2 strains of E.coli.  Goal of Therapy:  Eradication of infection   Plan:   1) Zosyn 3.375g IV q8 2) F/u CBC and patient clinical status 3) Consider narrowing antibiotic therapy further per susceptibilities when appropriate.

## 2013-03-29 NOTE — Progress Notes (Signed)
   VASCULAR PROGRESS NOTE  SUBJECTIVE: On vent. The pain appears adequately controlled.  PHYSICAL EXAM: Filed Vitals:   03/29/13 0600 03/29/13 0615 03/29/13 0630 03/29/13 0645  BP: 120/56 99/51 93/53  95/52  Pulse: 83 80 78 80  Temp:      TempSrc:      Resp: 19 18 18 18   Height:      Weight:      SpO2: 100% 100% 100% 100%   Right AKA dressing dry. No wounds on the left foot.  LABS: Lab Results  Component Value Date   WBC 17.3* 03/29/2013   HGB 10.1* 03/29/2013   HCT 29.0* 03/29/2013   MCV 84.8 03/29/2013   PLT 52* 03/29/2013   Lab Results  Component Value Date   CREATININE 0.91 03/29/2013   Lab Results  Component Value Date   INR 2.12* 03/26/2013   CBG (last 3)   Recent Labs  03/28/13 1147 03/28/13 1535 03/28/13 1954  GLUCAP 119* 111* 119*    Principal Problem:   Septic shock(785.52) Active Problems:   CHF (congestive heart failure)   HTN (hypertension)   Open wound of right foot   Ischemic foot   Smoking hx   Alcohol abuse   Bilateral leg edema   Severe sepsis(995.92)   Severe protein-calorie malnutrition   Septic shock   Acute respiratory failure with hypoxia   Alcohol withdrawal delirium   Acute systolic CHF (congestive heart failure), NYHA class 4   ASSESSMENT AND PLAN:  * 3 Days Post-Op s/p: right AKA  *  Continued dressing changes.  Cari Caraway Beeper: [phone] 03/29/2013

## 2013-03-29 NOTE — Progress Notes (Signed)
eLink Physician-Brief Progress Note Patient Name: Rodney [name] DOB: 06/25/1945 MRN: [id number]  Date of Service  03/29/2013   HPI/Events of Note     eICU Interventions  Hypokalemia -repleted    Intervention Category Minor Interventions: Electrolytes abnormality - evaluation and management  [name] V. 03/29/2013, 5:55 AM

## 2013-03-30 LAB — COMPREHENSIVE METABOLIC PANEL
ALT: 27 U/L (ref 0–53)
Alkaline Phosphatase: 118 U/L — ABNORMAL HIGH (ref 39–117)
BUN: 31 mg/dL — ABNORMAL HIGH (ref 6–23)
CO2: 25 mEq/L (ref 19–32)
Calcium: 7.3 mg/dL — ABNORMAL LOW (ref 8.4–10.5)
Chloride: 100 mEq/L (ref 96–112)
GFR calc Af Amer: 84 mL/min — ABNORMAL LOW (ref >90)
Glucose, Bld: 94 mg/dL (ref 70–99)
Potassium: 2.9 mEq/L — ABNORMAL LOW (ref 3.5–5.1)
Sodium: 133 mEq/L — ABNORMAL LOW (ref 135–145)
Total Bilirubin: 1.2 mg/dL (ref 0.3–1.2)
Total Protein: 5.2 g/dL — ABNORMAL LOW (ref 6.0–8.3)

## 2013-03-30 LAB — GLUCOSE, CAPILLARY
Glucose-Capillary: 103 mg/dL — ABNORMAL HIGH (ref 70–99)
Glucose-Capillary: 104 mg/dL — ABNORMAL HIGH (ref 70–99)
Glucose-Capillary: 129 mg/dL — ABNORMAL HIGH (ref 70–99)
Glucose-Capillary: 145 mg/dL — ABNORMAL HIGH (ref 70–99)
Glucose-Capillary: 151 mg/dL — ABNORMAL HIGH (ref 70–99)

## 2013-03-30 LAB — CBC WITH DIFFERENTIAL/PLATELET
Basophils Absolute: 0 10*3/uL (ref 0.0–0.1)
Basophils Relative: 0 % (ref 0–1)
Eosinophils Absolute: 0 10*3/uL (ref 0.0–0.7)
Eosinophils Relative: 0 % (ref 0–5)
HCT: 28.4 % — ABNORMAL LOW (ref 39.0–52.0)
Hemoglobin: 9.9 g/dL — ABNORMAL LOW (ref 13.0–17.0)
MCH: 29.7 pg (ref 26.0–34.0)
MCHC: 34.9 g/dL (ref 30.0–36.0)
Monocytes Absolute: 0.7 10*3/uL (ref 0.1–1.0)
Neutro Abs: 16.1 10*3/uL — ABNORMAL HIGH (ref 1.7–7.7)
RDW: 16.4 % — ABNORMAL HIGH (ref 11.5–15.5)

## 2013-03-30 LAB — BASIC METABOLIC PANEL
Calcium: 7.5 mg/dL — ABNORMAL LOW (ref 8.4–10.5)
GFR calc Af Amer: 76 mL/min — ABNORMAL LOW (ref >90)
GFR calc non Af Amer: 66 mL/min — ABNORMAL LOW (ref >90)
Sodium: 136 mEq/L (ref 135–145)

## 2013-03-30 MED ORDER — POTASSIUM CHLORIDE CRYS ER 20 MEQ PO TBCR
40.0000 meq | EXTENDED_RELEASE_TABLET | ORAL | Status: AC
  Administered 2013-03-30 (×2): 40 meq via ORAL
  Filled 2013-03-30 (×2): qty 2

## 2013-03-30 MED ORDER — CAPTOPRIL 6.25 MG HALF TABLET
6.2500 mg | ORAL_TABLET | Freq: Three times a day (TID) | ORAL | Status: DC
  Administered 2013-03-30 – 2013-04-01 (×7): 6.25 mg via ORAL
  Filled 2013-03-30 (×9): qty 1

## 2013-03-30 MED ORDER — POTASSIUM CHLORIDE CRYS ER 20 MEQ PO TBCR
40.0000 meq | EXTENDED_RELEASE_TABLET | Freq: Once | ORAL | Status: AC
  Administered 2013-03-30: 40 meq via ORAL
  Filled 2013-03-30: qty 2

## 2013-03-30 NOTE — Progress Notes (Signed)
Name: Rodney [name] MRN: [id number] DOB: 1946-01-05    ADMISSION DATE:  03/25/2013 CONSULTATION DATE:  03/30/2013  REFERRING MD :  [facility] PRIMARY SERVICE: PCCM  CHIEF COMPLAINT:   Septic shock  BRIEF PATIENT DESCRIPTION:  67 y.o.AAM with ischemic LE, sepsis, shock, VDRF tfr from [facility] for vascular and CCM care 03/26/13   SIGNIFICANT EVENTS / STUDIES:  12/24: intubated for resp failure at [facility] and [facility] 12/24: for OR for R AKA  LINES / TUBES: R [place] CVL 12/24 ETT 12/24 >> 12/27 R rad art line 12/24>>12/25   CULTURES: BC x 2 12/23>>>ecoli   ANTIBIOTICS: 12/23 doxy>>12/24 12/23 vanco12/24 12/23 zosyn 12/24 cipro>>12/26  SUBJECTIVE:  Extuabted.  tol milrinone well Denies pain Afebrile  VITAL SIGNS: Temp:  [97.6 F (36.4 C)-98.2 F (36.8 C)] 98.1 F (36.7 C) (12/28 0719) Pulse Rate:  [79-94] 80 (12/28 0700) Resp:  [14-34] 18 (12/28 0700) BP: (103-128)/(53-91) 118/60 mmHg (12/28 0700) SpO2:  [99 %-100 %] 100 % (12/28 0700) HEMODYNAMICS:    VENTILATOR SETTINGS:   INTAKE / OUTPUT: Intake/Output     12/27 0701 - 12/28 0700 12/28 0701 - 12/29 0700   P.O. 600    I.V. (mL/kg) 570.2 (8.8)    NG/GT 365    IV Piggyback 150    Total Intake(mL/kg) 1685.2 (25.9)    Urine (mL/kg/hr) 3015 (1.9)    Stool 150 (0.1)    Total Output 3165     Net -1479.8              PHYSICAL EXAMINATION: General:  Ill appearing AAM in NAD Neuro: awake, laert HEENT:  +JVD   Cardiovascular:  RRR nl s1 s2 no s3 Lungs:  clearer Abdomen:  Soft NT  Musculoskeletal:  R AKA dressing dry, intact Skin: clear  LABS:  CBC  Recent Labs Lab 03/28/13 0500 03/29/13 0425 03/30/13 0420  WBC 25.0* 17.3* 17.8*  HGB 12.0* 10.1* 9.9*  HCT 33.9* 29.0* 28.4*  PLT 57* 52* 55*   Coag's  Recent Labs Lab 03/26/13 1055  INR 2.12*   BMET  Recent Labs Lab 03/28/13 0500 03/29/13 0425 03/30/13 0420  NA 135 134* 133*  K 4.3 3.3* 2.9*  CL 103 102 100  CO2 22 22 25   BUN 23 29*  31*  CREATININE 0.99 0.91 1.03  GLUCOSE 153* 124* 94   Electrolytes  Recent Labs Lab 03/27/13 2335 03/28/13 0500 03/29/13 0425 03/30/13 0420  CALCIUM 7.2* 7.0* 7.2* 7.3*  MG 1.6  --  2.3  --   PHOS 2.2*  --  2.2*  --    Sepsis Markers  Recent Labs Lab 03/25/13 2312  LATICACIDVEN 9.1*   ABG  Recent Labs Lab 03/25/13 2308 03/26/13 0330 03/26/13 1049  PHART 7.455* 7.422 7.403  PCO2ART 25.8* 30.4* 36.2  PO2ART 359.0* 132.0* 394.0*   Liver Enzymes  Recent Labs Lab 03/25/13 1240 03/26/13 0422 03/30/13 0420  AST 52* 41* 32  ALT 26 17 27   ALKPHOS 162* 105 118*  BILITOT 2.2* 1.6* 1.2  ALBUMIN 3.0* 1.8* 1.8*   Cardiac Enzymes  Recent Labs Lab 03/25/13 1240 03/25/13 1625 03/25/13 2159 03/26/13 0422  TROPONINI <0.30 <0.30 <0.30 0.70*  PROBNP 14339.0*  --   --   --    Glucose  Recent Labs Lab 03/29/13 1158 03/29/13 1656 03/29/13 2001 03/29/13 2352 03/30/13 0404 03/30/13 0717  GLUCAP 119* 95 125* 103* 104* 92    Imaging Dg Chest Port 1 View  03/29/2013  CLINICAL DATA:  Shock and respiratory failure and right lower extremity gangrene. , history of CHF  EXAM: PORTABLE CHEST - 1 VIEW  COMPARISON:  Chest x-ray of March 28, 2013.  FINDINGS: The lungs are adequately inflated. The left hemidiaphragm remains obscured in the retrocardiac region on the left remains dense. The cardiopericardial silhouette is mildly enlarged. The pulmonary vascularity is engorged. The pulmonary interstitial markings however less conspicuous today. There is a right subclavian venous catheter in place with the tip in the region of the proximal to mid SVC. The endotracheal tube tip lies lies 3.2 cm above the crotch of the carina. The esophagogastric tube tip and proximal port lies in the region of the distal portion of the stomach.  IMPRESSION: 1. There is improved appearance of the pulmonary interstitium bilaterally. There remains left lower lobe atelectasis and a possible small left  pleural effusion. There is no pneumothorax. 2. The cardiopericardial silhouette remains mildly enlarged. The pulmonary vascularity is prominent centrally but stable. 3. The support tubes and lines appear to be in appropriate position.   Electronically Signed   By: David  [place]   On: 03/29/2013 09:14     CXR: 12/27  Mild edema CM  ETT ok Echo: EF 15%  ASSESSMENT / PLAN:  PULMONARY A:VDRF, pulm edema, RLL CAP, septic shock d/t vascular ischemia Acute resp failure  P:   Extubated 12/27 BDs mobilise   CARDIOVASCULAR A: septic shock>>persisting. prob cardiac component.  CHF acute systolic EF 15% Severe PVD s/p R AKA cortisol 18 Cardiology consulted 12/26 has not seen the pt as of yet P:  Per VVS Cont milronone, failed DBA D/c hydrocortisone with normal cortisol Plan for ACE, coreg  RENAL A:  AKI>>resolved  hypokalemia P:   Monitor Replete K  GASTROINTESTINAL A:  Severe protein cal malnutrition P:   ppi sup adv diet as able  HEMATOLOGIC A:  No acute issues P:  monitor  INFECTIOUS A:  Sepsis septic shock>>persists.  E coli  bacteremia P:   See dashboard>>abx narrowed to zosyn alone   ENDOCRINE A:  No issues   P:   SSI  NEUROLOGIC A: ETOH WD, acute delirium>>improved AM 12/27 P:   Prn fentanyl for Pain control  TODAY'S SUMMARY:  67 y.o.M with ischemic LE, septic shock. VDRF.  GNR bacteremia, s/p AKA   Improved- cont milrinone, lasix per cards - needs to optimise cardiac status with ACE, coreg  I have personally obtained a history, examined the patient, evaluated laboratory and imaging results, formulated the assessment and plan and placed orders. CRITICAL CARE: The patient is critically ill with multiple organ systems failure and requires high complexity decision making for assessment and support, frequent evaluation and titration of therapies, application of advanced monitoring technologies and extensive interpretation of multiple databases. Critical  Care Time devoted to patient care services described in this note is 31  minutes.   [facility] V.MD Cyril Mourning MD. Tonny Bollman. [place] [facility] Pager [phone] If no response call [phone]    03/30/2013, 8:24 AM

## 2013-03-30 NOTE — Progress Notes (Signed)
Pt with maroon and brown liquid stool via flexiseal. Dr Vassie Loll informed. Flexiseal removed per orders

## 2013-03-30 NOTE — Progress Notes (Signed)
    Subjective:  Denies CP or dyspnea   Objective:  Filed Vitals:   03/30/13 0700 03/30/13 0719 03/30/13 0800 03/30/13 0900  BP: 118/60  116/59 108/76  Pulse: 80  79 87  Temp:  98.1 F (36.7 C)    TempSrc:  Oral    Resp: 18  25 17   Height:      Weight:      SpO2: 100%  100% 100%    Intake/Output from previous day:  Intake/Output Summary (Last 24 hours) at 03/30/13 0948 Last data filed at 03/30/13 0900  Gross per 24 hour  Intake 1767.7 ml  Output   3205 ml  Net -1437.3 ml    Physical Exam: Physical exam: Well-developed chronically ill appearing in no acute distress.  Skin is warm and dry.  HEENT edentulous Neck is supple.  Chest with diminished BS bases bilaterally Cardiovascular exam is regular rate and rhythm. 2/6 systolic murmur apex and LSB Abdominal exam nontender or distended. No masses palpated. Positive presacral edema Extremities show 1-2+ thigh edema. S/p right AKA neuro grossly intact    Lab Results: Basic Metabolic Panel:  Recent Labs  57/84/69 2335  03/29/13 0425 03/30/13 0420  NA 136  < > 134* 133*  K 3.0*  < > 3.3* 2.9*  CL 103  < > 102 100  CO2 23  < > 22 25  GLUCOSE 110*  < > 124* 94  BUN 22  < > 29* 31*  CREATININE 1.00  < > 0.91 1.03  CALCIUM 7.2*  < > 7.2* 7.3*  MG 1.6  --  2.3  --   PHOS 2.2*  --  2.2*  --   < > = values in this interval not displayed. CBC:  Recent Labs  03/29/13 0425 03/30/13 0420  WBC 17.3* 17.8*  NEUTROABS 16.2* 16.1*  HGB 10.1* 9.9*  HCT 29.0* 28.4*  MCV 84.8 85.3  PLT 52* 55*     Assessment/Plan:  1 acute systolic congestive heart failure-the patient's echocardiogram shows severe LV dysfunction. There is moderate to severe aortic and mitral regurgitation and severe tricuspid regurgitation. Etiology of his cardiomyopathy is unclear. 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 He may also have coronary disease particularly in light of his peripheral vascular disease. He has been hypotensive  most likely secondary to septic shock from his ischemic leg. He is noted to have Escherichia coli in his blood. He has been weaned off of Levophed. I agree with continuing milrinone. He remains volume overloaded on examination. Continue Lasix 40 BID and follow renal function. Add captopril and increase as tolerated by BP. Once his CHF improves we will add low-dose beta-blockade.  2 cardiomyopathy-etiology unclear. Question alcohol versus coronary disease. Not clear he will be a candidate for cardiac catheterization given ETOH, thrombocytopenia, poor nutritional status and recent sepsis. I would like for him to improve prior to making decision; we also need to make sure that he is not going to develoop withdrawal from alcohol before proceeding.  3 peripheral vascular disease-status post right AKA. He will most likely require revascularization of his left lower extremity. If so, he will need a cardiac catheterization prior to proceeding. In the meantime treat with aspirin and statin.  4 sepsis-continue antibiotics.  5 alcohol abuse-watch for withdrawal.   Olga Millers 03/30/2013, 9:48 AM

## 2013-03-30 NOTE — Progress Notes (Signed)
[facility] ADULT ICU REPLACEMENT PROTOCOL FOR AM LAB REPLACEMENT ONLY  The patient does apply for the [facility] Adult ICU Electrolyte Replacment Protocol based on the criteria listed below:   1. Is GFR >/= 40 ml/min? yes  Patient's GFR today is 84 2. Is urine output >/= 0.5 ml/kg/hr for the last 6 hours? yes Patient's UOP is 1.9 ml/kg/hr 3. Is BUN < 60 mg/dL? yes  Patient's BUN today is 31 4. Abnormal electrolyte(s): Potassium 5. Ordered repletion with: Potassium per Protocol 6. If a panic level lab has been reported, has the CCM MD in charge been notified? yes.   Physician:  Dr. Winfield Rast, Dove G[place]am P 03/30/2013 5:17 AM

## 2013-03-30 NOTE — Progress Notes (Signed)
Name: Rodney [name] MRN: [id number] DOB: 07-10-1945  ELECTRONIC ICU PHYSICIAN NOTE  Problem:  hyopkalemia on captopril and lasix   Recent Labs Lab 03/29/13 0425 03/30/13 0420 03/30/13 2100  NA 134* 133* 136  K 3.3* 2.9* 3.1*  CL 102 100 102  CO2 22 25 25   BUN 29* 31* 31*  CREATININE 0.91 1.03 1.12  GLUCOSE 124* 94 119*       Intervention:  KCl 40 meq po x one  Sandrea Hughs 03/30/2013, 10:21 PM

## 2013-03-31 ENCOUNTER — Encounter (HOSPITAL_COMMUNITY): Payer: Self-pay | Admitting: Vascular Surgery

## 2013-03-31 DIAGNOSIS — E43 Unspecified severe protein-calorie malnutrition: Secondary | ICD-10-CM

## 2013-03-31 DIAGNOSIS — A419 Sepsis, unspecified organism: Principal | ICD-10-CM

## 2013-03-31 DIAGNOSIS — I739 Peripheral vascular disease, unspecified: Secondary | ICD-10-CM

## 2013-03-31 DIAGNOSIS — I5021 Acute systolic (congestive) heart failure: Secondary | ICD-10-CM

## 2013-03-31 DIAGNOSIS — F1010 Alcohol abuse, uncomplicated: Secondary | ICD-10-CM

## 2013-03-31 DIAGNOSIS — I509 Heart failure, unspecified: Secondary | ICD-10-CM

## 2013-03-31 LAB — CULTURE, BLOOD (ROUTINE X 2)

## 2013-03-31 LAB — GLUCOSE, CAPILLARY
Glucose-Capillary: 115 mg/dL — ABNORMAL HIGH (ref 70–99)
Glucose-Capillary: 83 mg/dL (ref 70–99)
Glucose-Capillary: 93 mg/dL (ref 70–99)

## 2013-03-31 LAB — BASIC METABOLIC PANEL
BUN: 30 mg/dL — ABNORMAL HIGH (ref 6–23)
CO2: 26 mEq/L (ref 19–32)
Chloride: 105 mEq/L (ref 96–112)
Creatinine, Ser: 1.07 mg/dL (ref 0.50–1.35)
GFR calc Af Amer: 80 mL/min — ABNORMAL LOW (ref >90)
Glucose, Bld: 83 mg/dL (ref 70–99)
Potassium: 3.3 mEq/L — ABNORMAL LOW (ref 3.5–5.1)

## 2013-03-31 MED ORDER — DEXTROSE 5 % IV SOLN
1.0000 g | INTRAVENOUS | Status: DC
  Administered 2013-03-31 – 2013-04-03 (×3): 1 g via INTRAVENOUS
  Filled 2013-03-31 (×5): qty 10

## 2013-03-31 MED ORDER — CARVEDILOL 3.125 MG PO TABS
3.1250 mg | ORAL_TABLET | Freq: Two times a day (BID) | ORAL | Status: DC
  Administered 2013-04-01 – 2013-04-03 (×5): 3.125 mg via ORAL
  Filled 2013-03-31 (×8): qty 1

## 2013-03-31 MED ORDER — CHLORDIAZEPOXIDE HCL 5 MG PO CAPS
10.0000 mg | ORAL_CAPSULE | Freq: Every day | ORAL | Status: DC
  Administered 2013-04-01 – 2013-04-07 (×7): 10 mg via ORAL
  Filled 2013-03-31 (×6): qty 2

## 2013-03-31 MED ORDER — FUROSEMIDE 40 MG PO TABS
40.0000 mg | ORAL_TABLET | Freq: Two times a day (BID) | ORAL | Status: DC
  Administered 2013-03-31 – 2013-04-01 (×2): 40 mg via ORAL
  Filled 2013-03-31 (×4): qty 1

## 2013-03-31 MED ORDER — POTASSIUM CHLORIDE CRYS ER 20 MEQ PO TBCR
40.0000 meq | EXTENDED_RELEASE_TABLET | Freq: Once | ORAL | Status: AC
  Administered 2013-03-31: 40 meq via ORAL
  Filled 2013-03-31: qty 2

## 2013-03-31 MED ORDER — MORPHINE SULFATE 2 MG/ML IJ SOLN: 2.0000 mg | INTRAMUSCULAR | Status: DC | PRN

## 2013-03-31 NOTE — Progress Notes (Signed)
Name: Rodney [name] MRN: [id number] DOB: [birth year]    ADMISSION DATE:  03/25/2013 CONSULTATION DATE:  03/31/2013  REFERRING MD :  [facility] PRIMARY SERVICE: PCCM  CHIEF COMPLAINT:   Septic shock  BRIEF PATIENT DESCRIPTION:  67 y.o.AAM with ischemic LE, sepsis, shock, VDRF tfr from [facility] for vascular and CCM care 03/26/13   SIGNIFICANT EVENTS / STUDIES:  12/24: intubated for resp failure at [facility] 12/24: for OR for R AKA  LINES / TUBES: R [place] CVL 12/24 ETT 12/24 >> 12/27 R rad art line 12/24>>12/25   CULTURES: BC x 2 12/23>>>ecoli   ANTIBIOTICS: 12/23 doxy>>12/24 12/23 vanco12/24 12/23 zosyn 12/24 cipro>>12/26  SUBJECTIVE:  Extuabted. Remains on  milrinone Denies pain Afebrile  VITAL SIGNS: Temp:  [97.7 F (36.5 C)-98.4 F (36.9 C)] 97.9 F (36.6 C) (12/29 0745) Pulse Rate:  [76-89] 88 (12/29 1000) Resp:  [12-31] 19 (12/29 1000) BP: (113-125)/(52-75) 118/69 mmHg (12/29 1000) SpO2:  [61 %-98 %] 97 % (12/29 1000) Weight:  [63.957 kg (141 lb)] 63.957 kg (141 lb) (12/29 0500) HEMODYNAMICS:    VENTILATOR SETTINGS:   INTAKE / OUTPUT: Intake/Output     12/28 0701 - 12/29 0700 12/29 0701 - 12/30 0700   P.O. 1340 240   I.V. (mL/kg) 500.2 (7.8) 74.4 (1.2)   NG/GT     IV Piggyback 150 50   Total Intake(mL/kg) 1990.2 (31.1) 364.4 (5.7)   Urine (mL/kg/hr) 1635 (1.1) 925 (3.6)   Stool 860 (0.6)    Total Output 2495 925   Net -504.8 -560.6        Stool Occurrence 1 x        PHYSICAL EXAMINATION: General:  Ill appearing AAM in NAD Neuro: awake, laert HEENT:  +JVD   Cardiovascular:  RRR nl s1 s2 no s3 Lungs:  clearer Abdomen:  Soft NT  Musculoskeletal:  R AKA dressing dry, intact Skin: clear  LABS:  CBC  Recent Labs Lab 03/28/13 0500 03/29/13 0425 03/30/13 0420  WBC 25.0* 17.3* 17.8*  HGB 12.0* 10.1* 9.9*  HCT 33.9* 29.0* 28.4*  PLT 57* 52* 55*   Coag's  Recent Labs Lab 03/26/13 1055  INR 2.12*   BMET  Recent Labs Lab  03/30/13 0420 03/30/13 2100 03/31/13 0430  NA 133* 136 137  K 2.9* 3.1* 3.3*  CL 100 102 105  CO2 25 25 26   BUN 31* 31* 30*  CREATININE 1.03 1.12 1.07  GLUCOSE 94 119* 83   Electrolytes  Recent Labs Lab 03/27/13 2335  03/29/13 0425 03/30/13 0420 03/30/13 2100 03/31/13 0430  CALCIUM 7.2*  < > 7.2* 7.3* 7.5* 7.5*  MG 1.6  --  2.3  --   --  1.9  PHOS 2.2*  --  2.2*  --   --   --   < > = values in this interval not displayed. Sepsis Markers  Recent Labs Lab 03/25/13 2312  LATICACIDVEN 9.1*   ABG  Recent Labs Lab 03/25/13 2308 03/26/13 0330 03/26/13 1049  PHART 7.455* 7.422 7.403  PCO2ART 25.8* 30.4* 36.2  PO2ART 359.0* 132.0* 394.0*   Liver Enzymes  Recent Labs Lab 03/25/13 1240 03/26/13 0422 03/30/13 0420  AST 52* 41* 32  ALT 26 17 27   ALKPHOS 162* 105 118*  BILITOT 2.2* 1.6* 1.2  ALBUMIN 3.0* 1.8* 1.8*   Cardiac Enzymes  Recent Labs Lab 03/25/13 1240 03/25/13 1625 03/25/13 2159 03/26/13 0422  TROPONINI <0.30 <0.30 <0.30 0.70*  PROBNP 14339.0*  --   --   --  Glucose  Recent Labs Lab 03/30/13 1145 03/30/13 1630 03/30/13 1950 03/31/13 0005 03/31/13 0351 03/31/13 0747  GLUCAP 155* 123* 129* 115* 90 80    Imaging No results found.   CXR: 12/27  Mild edema CM  ETT ok Echo: EF 15%  ASSESSMENT / PLAN:  PULMONARY A:VDRF, pulm edema, RLL CAP, septic shock d/t vascular ischemia Acute resp failure  P:   Extubated 12/27 BDs mobilise   CARDIOVASCULAR A: septic shock>>persisting. prob cardiac component.  CHF acute systolic EF 15% Severe PVD s/p R AKA cortisol 18 Cardiology consulted 12/26 has not seen the pt as of yet P:  Per [facility] dc milronone, failed DBA D/c hydrocortisone with normal cortisol Ct captopril, coreg to be added by cards ?need for cath  RENAL A:  AKI>>resolved  hypokalemia P:   Monitor Replete K  GASTROINTESTINAL A:  Severe protein cal malnutrition P:   ppi sup adv diet as able  HEMATOLOGIC A:   No acute issues P:  monitor  INFECTIOUS A:  Sepsis septic shock>>persists.  E coli  bacteremia P:   See dashboard>>abx narrowed to ceftx alone -plan for 10 ds until 1/1  ENDOCRINE A:  No issues   P:   SSI  NEUROLOGIC A: ETOH WD, acute delirium>>improved AM 12/27 P:   Prn fentanyl for Pain control  TODAY'S SUMMARY:  67 y.o.M with ischemic LE, septic shock. VDRF.  GNR bacteremia, s/p AKA   Improved-dc milrinone, lasix per cards - needs to optimise cardiac status with ACE, coreg  I have personally obtained a history, examined the patient, evaluated laboratory and imaging results, formulated the assessment and plan and placed orders. CRITICAL CARE: The patient is critically ill with multiple organ systems failure and requires high complexity decision making for assessment and support, frequent evaluation and titration of therapies, application of advanced monitoring technologies and extensive interpretation of multiple databases. Critical Care Time devoted to patient care services described in this note is 31  minutes.   [facility] V.MD Cyril Mourning MD. Tonny Bollman. [place] [facility] Pager [phone] If no response call [phone]    03/31/2013, 11:04 AM

## 2013-03-31 NOTE — Progress Notes (Signed)
Subjective:  Denies CP or dyspnea   Objective:  Filed Vitals:   03/31/13 1500 03/31/13 1600 03/31/13 1607 03/31/13 1732  BP: 123/99 124/73  119/61  Pulse: 82 82  81  Temp:   97.6 F (36.4 C) 97.6 F (36.4 C)  TempSrc:   Oral Oral  Resp: 18 14  16   Height:    5\' 8"  (1.727 m)  Weight:    151 lb 3.8 oz (68.6 kg)  SpO2: 100% 99%  94%    Intake/Output from previous day:  Intake/Output Summary (Last 24 hours) at 03/31/13 1829 Last data filed at 03/31/13 1828  Gross per 24 hour  Intake 1329.1 ml  Output   3095 ml  Net -1765.9 ml    Physical Exam: Physical exam: Well-developed chronically ill appearing in no acute distress.  Skin is warm and dry.  HEENT edentulous Neck is supple.  Chest with diminished BS bases bilaterally Cardiovascular exam is regular rate and rhythm. 2/6 systolic murmur apex and LSB Abdominal exam nontender or distended. No masses palpated. Positive presacral edema Extremities show 1-2+ thigh edema. S/p right AKA neuro grossly intact    Lab Results: Basic Metabolic Panel:  Recent Labs  16/10/96 0425  03/30/13 2100 03/31/13 0430  NA 134*  < > 136 137  K 3.3*  < > 3.1* 3.3*  CL 102  < > 102 105  CO2 22  < > 25 26  GLUCOSE 124*  < > 119* 83  BUN 29*  < > 31* 30*  CREATININE 0.91  < > 1.12 1.07  CALCIUM 7.2*  < > 7.5* 7.5*  MG 2.3  --   --  1.9  PHOS 2.2*  --   --   --   < > = values in this interval not displayed. CBC:  Recent Labs  03/29/13 0425 03/30/13 0420  WBC 17.3* 17.8*  NEUTROABS 16.2* 16.1*  HGB 10.1* 9.9*  HCT 29.0* 28.4*  MCV 84.8 85.3  PLT 52* 55*   Echo: Study Conclusions  - Left ventricle: The cavity size was mildly dilated. Wall thickness was normal. The estimated ejection fraction was 15%. Severe diffuse hypokinesis. - Ventricular septum: The contour showed diastolic flattening. - Aortic valve: Moderately calcified annulus. Mildly thickened, mildly calcified leaflets. Moderate to severe regurgitation.  Valve area: 1.45cm^2(VTI). Valve area: 1.32cm^2 (Vmax). - Mitral valve: Moderate to severe regurgitation. - Left atrium: The atrium was mildly dilated. - Right ventricle: The cavity size was moderately dilated. Systolic function was mildly reduced. - Right atrium: The atrium was mildly dilated. - Tricuspid valve: Severe regurgitation. - Pulmonary arteries: PA peak pressure: 35mm Hg (S). - Pericardium, extracardiac: A trivial pericardial effusion was identified.    Assessment/Plan:  1. Acute systolic congestive heart failure-the patient's echocardiogram shows severe LV dysfunction. EF 15%.Moderate to severe aortic and mitral regurgitation and severe tricuspid regurgitation. Etiology of his cardiomyopathy is unclear. 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 He may also have coronary disease particularly in light of his peripheral vascular disease. Symptoms of shock have resolved. Now off pressors and milrinone. Now on oral lasix. Continue Lasix 40 BID and follow renal function. Add captopril and increase as tolerated by BP. Once his CHF improves we will add low-dose beta-blockade.  2. Dilated cardiomyopathy-etiology unclear. Question alcohol versus coronary disease. Not clear he will be a candidate for cardiac catheterization given ETOH, thrombocytopenia, poor nutritional status and recent sepsis. I would like for him to improve prior to making decision; we also  need to make sure that he is not going to develoop withdrawal from alcohol before proceeding.  3. Peripheral vascular disease-status post right AKA. He will most likely require revascularization of his left lower extremity. If so, he will need a cardiac catheterization prior to proceeding. In the meantime treat with aspirin and statin.  4. Sepsis-continue antibiotics per CCM.  5. Alcohol abuse-watch for withdrawal.   Theron Arista [facility] 03/31/2013, 6:29 PM
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
## 2013-03-31 NOTE — Progress Notes (Signed)
Transferred to 4E05 via wheelchair. Alert and oriented. On room air with O2 sat at 98%. Report given to receiving RN. Made aware that Dr. Vassie Loll was notified that the patient had maroon colored stool this AM. Seen by Cardiology prior to leaving the unit. No untoward event happened during transport.Marland Kitchen

## 2013-03-31 NOTE — Progress Notes (Signed)
VASCULAR LAB PRELIMINARY  ARTERIAL  ABI completed:    RIGHT    LEFT    PRESSURE WAVEFORM  PRESSURE WAVEFORM  BRACHIAL 120 Triphasic BRACHIAL 118 Triphasic  DP AKA  DP 68 Dampened Monophasic  PT AKA  PT 60 Severely Dampened Monophasic    RIGHT LEFT  ABI AKA 0.57   Right ABI Not obtained due to AKA. Left ABI indicates a moderate to severe reduction in arterial flow.  Meliton Samad, RVS 03/31/2013, 12:24 PM

## 2013-03-31 NOTE — Anesthesia Postprocedure Evaluation (Signed)
  Anesthesia Post-op Note  Patient: Rodney [name]  Procedure(s) Performed: Procedure(s): AMPUTATION ABOVE KNEE (Right)  Patient Location: Nursing Unit  Anesthesia Type:General  Level of Consciousness: awake, alert  and oriented  Airway and Oxygen Therapy: Patient Spontanous Breathing and Patient connected to nasal cannula oxygen  Post-op Pain: mild  Post-op Assessment: Post-op Vital signs reviewed, Patient's Cardiovascular Status Stable, Respiratory Function Stable, Patent Airway, No signs of Nausea or vomiting, Adequate PO intake and Pain level controlled  Post-op Vital Signs: Reviewed and stable  Complications: No apparent anesthesia complications

## 2013-03-31 NOTE — Progress Notes (Signed)
Patient continuing to follow patient for appropriate dc plans as needed.  Maree Krabbe, MSW, Theresia Majors [phone]
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
## 2013-04-01 DIAGNOSIS — R7881 Bacteremia: Secondary | ICD-10-CM

## 2013-04-01 DIAGNOSIS — I999 Unspecified disorder of circulatory system: Secondary | ICD-10-CM

## 2013-04-01 DIAGNOSIS — J9600 Acute respiratory failure, unspecified whether with hypoxia or hypercapnia: Secondary | ICD-10-CM

## 2013-04-01 LAB — CBC
HCT: 31.8 % — ABNORMAL LOW (ref 39.0–52.0)
HCT: 37.1 % — ABNORMAL LOW (ref 39.0–52.0)
HCT: 32.7 % — ABNORMAL LOW (ref 39.0–52.0)
Hemoglobin: 12.7 g/dL — ABNORMAL LOW (ref 13.0–17.0)
MCH: 30.2 pg (ref 26.0–34.0)
MCH: 29.9 pg (ref 26.0–34.0)
MCHC: 34.2 g/dL (ref 30.0–36.0)
MCHC: 33.9 g/dL (ref 30.0–36.0)
MCV: 88.3 fL (ref 78.0–100.0)
MCV: 88.1 fL (ref 78.0–100.0)
Platelets: 107 10*3/uL — ABNORMAL LOW (ref 150–400)
Platelets: 124 10*3/uL — ABNORMAL LOW (ref 150–400)
RBC: 3.66 MIL/uL — ABNORMAL LOW (ref 4.22–5.81)
RBC: 3.71 MIL/uL — ABNORMAL LOW (ref 4.22–5.81)
RDW: 16.7 % — ABNORMAL HIGH (ref 11.5–15.5)
RDW: 16.8 % — ABNORMAL HIGH (ref 11.5–15.5)
RDW: 16.9 % — ABNORMAL HIGH (ref 11.5–15.5)
WBC: 6.3 10*3/uL (ref 4.0–10.5)

## 2013-04-01 LAB — BASIC METABOLIC PANEL
Calcium: 8 mg/dL — ABNORMAL LOW (ref 8.4–10.5)
Chloride: 105 mEq/L (ref 96–112)
Creatinine, Ser: 1 mg/dL (ref 0.50–1.35)
GFR calc Af Amer: 87 mL/min — ABNORMAL LOW (ref >90)

## 2013-04-01 LAB — GLUCOSE, CAPILLARY
Glucose-Capillary: 65 mg/dL — ABNORMAL LOW (ref 70–99)
Glucose-Capillary: 73 mg/dL (ref 70–99)
Glucose-Capillary: 78 mg/dL (ref 70–99)

## 2013-04-01 MED ORDER — SPIRONOLACTONE 12.5 MG HALF TABLET
12.5000 mg | ORAL_TABLET | Freq: Every day | ORAL | Status: DC
  Administered 2013-04-01 – 2013-04-04 (×4): 12.5 mg via ORAL
  Filled 2013-04-01 (×4): qty 1

## 2013-04-01 MED ORDER — ENSURE COMPLETE PO LIQD
237.0000 mL | Freq: Two times a day (BID) | ORAL | Status: DC
  Administered 2013-04-01 – 2013-04-07 (×11): 237 mL via ORAL

## 2013-04-01 MED ORDER — SODIUM CHLORIDE 0.9 % IV SOLN: 250.0000 mL | INTRAVENOUS | Status: DC | PRN

## 2013-04-01 MED ORDER — ASPIRIN 81 MG PO CHEW
81.0000 mg | CHEWABLE_TABLET | ORAL | Status: AC
  Administered 2013-04-02: 81 mg via ORAL
  Filled 2013-04-01: qty 1

## 2013-04-01 MED ORDER — LISINOPRIL 2.5 MG PO TABS
2.5000 mg | ORAL_TABLET | Freq: Two times a day (BID) | ORAL | Status: DC
  Administered 2013-04-01 (×2): 2.5 mg via ORAL
  Filled 2013-04-01 (×5): qty 1

## 2013-04-01 MED ORDER — SODIUM CHLORIDE 0.9 % IJ SOLN: 3.0000 mL | INTRAMUSCULAR | Status: DC | PRN

## 2013-04-01 MED ORDER — FUROSEMIDE 10 MG/ML IJ SOLN
40.0000 mg | Freq: Two times a day (BID) | INTRAMUSCULAR | Status: DC
  Administered 2013-04-01: 19:00:00 40 mg via INTRAVENOUS
  Filled 2013-04-01 (×3): qty 4

## 2013-04-01 MED ORDER — SODIUM CHLORIDE 0.9 % IJ SOLN: 3.0000 mL | Freq: Two times a day (BID) | INTRAMUSCULAR | Status: DC

## 2013-04-01 NOTE — Progress Notes (Addendum)
[facility] PROGRESS NOTE    Raahim Shartzer RUE:[id number] DOB: 30-Dec-1945 DOA: 03/25/2013 PCP: No PCP Per Patient  HPI/Brief narrative 67 year old male with history of hypertension, alcohol & tobacco abuse abuse and acute systolic CHF was transferred from [facility] with ischemic right lower extremity/nonhealing wound, sepsis, shock and was admitted by critical care team for VDRF. 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 Cardiology seeing for acute systolic CHF and dilated cardiomyopathy. After he stabilized, his care was transferred to the [facility] on 04/01/13.  Assessment/Plan:  Acute respiratory failure - Secondary to decompensated CHF, RLL CAP and septic shock - Extubated 12/27 - Stable.  Septic shock/Escherichia coli & Morganella morganii bacteremia - Secondary to Escherichia coli & Morganella morganii bacteremia-? Source. Antibiotics narrowed to ceftriaxone alone and plan for 10 days antibiotics until 04/03/13. No UA or urine culture sent on admission.  Acute systolic CHF/EF 15%/dilated cardiomyopathy - Etiology of cardiomyopathy is uncertain. 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 However given history of PAD, CAD needs to be ruled out. Continue diuresis as per cardiology. Cardiology plans for possible right and left heart cath 12/31. Continue Coreg, captopril changed to lisinopril and Aldactone added by cardiology.  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 He may need intervention to left leg at later date but has no complaints of left foot pain at this time. Patient needs to followup with Dr. Tawanna Cooler Early in 4 weeks for staple removal.  Hypertension - Controlled  History of alcohol abuse - No features of alcohol withdrawal at this time. Currently on Librium, thiamine and multivitamins.    Tobacco abuse - Cessation  counseled  Hypokalemia - Replaced  Anemia - Hemoglobin stable  Thrombocytopenia - Likely secondary to sepsis. Platelets gradually increasing  ??? GI bleed - Note this morning by RN regarding blood in stool. Patient unable to confirm. Hemoglobin has actually increased. Requested RN to inspect next BM and check for FOBT. Monitor CBCs. Continue IV Protonix.   Code Status: Full Family Communication: None at bedside Disposition Plan: To be determined   Consultants:  Vascular surgery  Cardiology  Critical care medicine  Procedures:  Right AKA  R Poth CVL 12/24   ETT 12/24 >> 12/27   R rad art line 12/24>>12/25  Antibiotics: 12/23 doxy>>12/24  12/23 vanco12/24  12/23 zosyn -DC'd 12/24 cipro>>12/26 IV Rocephin   Subjective: Denies complaints. Cannot tell me about his stools. Denies dyspnea or chest pain. Denies pain elsewhere. Per nursing report, blood seen in stools this AM  Objective: Filed Vitals:   03/31/13 1607 03/31/13 1732 03/31/13 2126 04/01/13 0427  BP:  119/61 127/63 126/68  Pulse:  81 87 84  Temp: 97.6 F (36.4 C) 97.6 F (36.4 C) 98.5 F (36.9 C) 97.9 F (36.6 C)  TempSrc: Oral Oral Oral Oral  Resp:  16 18 17   Height:  5\' 8"  (1.727 m)    Weight:  68.6 kg (151 lb 3.8 oz)  61.7 kg (136 lb 0.4 oz)  SpO2:  94% 98% 100%    Intake/Output Summary (Last 24 hours) at 04/01/13 1304 Last data filed at 04/01/13 1205  Gross per 24 hour  Intake    600 ml  Output    727 ml  Net   -127 ml   Filed Weights   03/31/13 0500 03/31/13 1732 04/01/13 0427  Weight: 63.957 kg (141 lb) 68.6 kg (151 lb 3.8 oz) 61.7 kg (136  lb 0.4 oz)     Exam:  General exam: Moderately built and poorly nourished, older than stated age, male lying comfortably in bed. Respiratory system: Clear. No increased work of breathing. Cardiovascular system: S1 & S2 heard, RRR. No JVD, murmurs, gallops, clicks. 1+ pitting left ankle edema. Telemetry: Sinus rhythm Gastrointestinal system:  Abdomen is nondistended, soft and nontender. Normal bowel sounds heard. Central nervous system: Alert and oriented. No focal neurological deficits. Extremities: Symmetric 5 x 5 power. Right AKA: Stump clean and staples intact.   Data Reviewed: Basic Metabolic Panel:  Recent Labs Lab 03/27/13 0430 03/27/13 2335  03/29/13 0425 03/30/13 0420 03/30/13 2100 03/31/13 0430 04/01/13 0520  NA 137 136  < > 134* 133* 136 137 143  K 3.1* 3.0*  < > 3.3* 2.9* 3.1* 3.3* 4.1  CL 103 103  < > 102 100 102 105 105  CO2 23 23  < > 22 25 25 26 28   GLUCOSE 98 110*  < > 124* 94 119* 83 70  BUN 23 22  < > 29* 31* 31* 30* 25*  CREATININE 1.13 1.00  < > 0.91 1.03 1.12 1.07 1.00  CALCIUM 7.0* 7.2*  < > 7.2* 7.3* 7.5* 7.5* 8.0*  MG  --  1.6  --  2.3  --   --  1.9  --   PHOS  --  2.2*  --  2.2*  --   --   --   --   < > = values in this interval not displayed. Liver Function Tests:  Recent Labs Lab 03/26/13 0422 03/30/13 0420  AST 41* 32  ALT 17 27  ALKPHOS 105 118*  BILITOT 1.6* 1.2  PROT 4.8* 5.2*  ALBUMIN 1.8* 1.8*   No results found for this basename: LIPASE, AMYLASE,  in the last 168 hours No results found for this basename: AMMONIA,  in the last 168 hours CBC:  Recent Labs Lab 03/28/13 0500 03/29/13 0425 03/30/13 0420 04/01/13 0520 04/01/13 1225  WBC 25.0* 17.3* 17.8* 6.3 7.0  NEUTROABS  --  16.2* 16.1*  --   --   HGB 12.0* 10.1* 9.9* 11.0* 12.7*  HCT 33.9* 29.0* 28.4* 31.8* 37.1*  MCV 86.0 84.8 85.3 86.9 88.3  PLT 57* 52* 55* 107* 120*   Cardiac Enzymes:  Recent Labs Lab 03/25/13 1625 03/25/13 2159 03/26/13 0422  TROPONINI <0.30 <0.30 0.70*   BNP (last 3 results)  Recent Labs  03/25/13 1240  PROBNP 14339.0*   CBG:  Recent Labs Lab 04/01/13 0236 04/01/13 0314 04/01/13 0423 04/01/13 0753 04/01/13 1101  GLUCAP 65* 73 80 78 75    Recent Results (from the past 240 hour(s))  CULTURE, BLOOD (ROUTINE X 2)     Status: None   Collection Time    03/25/13  5:34  PM      Result Value Range Status   Specimen Description BLOOD RIGHT FOREARM   Final   Special Requests BOTTLES DRAWN AEROBIC ONLY 3CC   Final   Culture  Setup Time     Final   Value: 03/26/2013 14:11     Performed at [facility]   Culture     Final   Value: ESCHERICHIA COLI     Note: Gram Stain Report Called to,Read Back By and Verified With: Concepcion Living 2300 1018 03/26/13 BY Ginette Pitman     Performed at [facility]   Report Status 03/28/2013 FINAL   Final   Organism ID, Bacteria ESCHERICHIA COLI  Final  CULTURE, BLOOD (ROUTINE X 2)     Status: None   Collection Time    03/25/13  9:59 PM      Result Value Range Status   Specimen Description BLOOD RIGHT HAND   Final   Special Requests BOTTLES DRAWN AEROBIC AND ANAEROBIC [facility] EACH   Final   Culture  Setup Time     Final   Value: 03/27/2013 23:41     Performed at [facility]   Culture     Final   Value: ESCHERICHIA COLI     MORGANELLA MORGANII     Note: Gram Stain Report Called to,Read Back By and Verified With:  LIZ M @0725  ON 03/28/13 BY SMIAS     Performed at [facility]   Report Status 03/31/2013 FINAL   Final   Organism ID, Bacteria ESCHERICHIA COLI   Final   Organism ID, Bacteria MORGANELLA MORGANII   Final  MRSA PCR SCREENING     Status: None   Collection Time    03/25/13 11:12 PM      Result Value Range Status   MRSA by PCR NEGATIVE  NEGATIVE Final   Comment:            The GeneXpert MRSA Assay (FDA     approved for NASAL specimens     only), is one component of a     comprehensive MRSA colonization     surveillance program. It is not     intended to diagnose MRSA     infection nor to guide or     monitor treatment for     MRSA infections.         Studies: No results found.      Scheduled Meds: . antiseptic oral rinse  15 mL Mouth Rinse BID  . aspirin  81 mg Oral Daily  . atorvastatin  20 mg Oral q1800  . carvedilol  3.125 mg Oral BID WC  . cefTRIAXone (ROCEPHIN)  IV   1 g Intravenous Q24H  . chlordiazePOXIDE  10 mg Oral Daily  . feeding supplement (ENSURE COMPLETE)  237 mL Oral BID BM  . folic acid  1 mg Oral Daily  . furosemide  40 mg Intravenous BID  . heparin subcutaneous  5,000 Units Subcutaneous Q8H  . insulin aspart  0-15 Units Subcutaneous Q4H  . lisinopril  2.5 mg Oral BID  . pantoprazole (PROTONIX) IV  40 mg Intravenous Q24H  . sodium chloride  10-40 mL Intracatheter Q12H  . sodium chloride  3 mL Intravenous Q12H  . spironolactone  12.5 mg Oral Daily  . thiamine  100 mg Per Tube Daily   Or  . thiamine  100 mg Intravenous Daily   Continuous Infusions:   Principal Problem:   Septic shock(785.52) Active Problems:   CHF (congestive heart failure)   HTN (hypertension)   Open wound of right foot   Ischemic foot   Smoking hx   Alcohol abuse   Bilateral leg edema   Severe sepsis(995.92)   Severe protein-calorie malnutrition   Septic shock   Acute respiratory failure with hypoxia   Alcohol withdrawal delirium   Acute systolic CHF (congestive heart failure), NYHA class 4    Time spent: 45 minutes.    Marcellus Scott, MD, FACP, FHM. [facility] Pager [phone]  If 7PM-7AM, please contact night-coverage [email or web address] Password [email or web address] 04/01/2013, 1:04 PM    LOS: 7 days

## 2013-04-01 NOTE — Progress Notes (Signed)
CBG result 65. Patient given snack and apple juice. Rechecked CBG after 15-72min and CBG result - 73. Will give another juice cup and continue to monitor patient's sugar.

## 2013-04-01 NOTE — Progress Notes (Addendum)
Patient ID: Rodney [name], male   DOB: September 18, 1945, 67 y.o.   MRN: [id number]   SUBJECTIVE: No complaints to me today.  Denies chest pain or dyspnea at rest.  Patient had a maroon-colored stool last night with concern for blood in stool but hemoglobin this morning is higher than yesterday.  It does not appear that stool guaiac was done.   Marland Kitchen antiseptic oral rinse  15 mL Mouth Rinse BID  . aspirin  81 mg Oral Daily  . atorvastatin  20 mg Oral q1800  . carvedilol  3.125 mg Oral BID WC  . cefTRIAXone (ROCEPHIN)  IV  1 g Intravenous Q24H  . chlordiazePOXIDE  10 mg Oral Daily  . feeding supplement (ENSURE COMPLETE)  237 mL Oral BID BM  . folic acid  1 mg Oral Daily  . furosemide  40 mg Intravenous BID  . heparin subcutaneous  5,000 Units Subcutaneous Q8H  . insulin aspart  0-15 Units Subcutaneous Q4H  . lisinopril  2.5 mg Oral BID  . pantoprazole (PROTONIX) IV  40 mg Intravenous Q24H  . sodium chloride  10-40 mL Intracatheter Q12H  . sodium chloride  3 mL Intravenous Q12H  . spironolactone  12.5 mg Oral Daily  . thiamine  100 mg Per Tube Daily   Or  . thiamine  100 mg Intravenous Daily      Filed Vitals:   03/31/13 1607 03/31/13 1732 03/31/13 2126 04/01/13 0427  BP:  119/61 127/63 126/68  Pulse:  81 87 84  Temp: 97.6 F (36.4 C) 97.6 F (36.4 C) 98.5 F (36.9 C) 97.9 F (36.6 C)  TempSrc: Oral Oral Oral Oral  Resp:  16 18 17   Height:  5\' 8"  (1.727 m)    Weight:  68.6 kg (151 lb 3.8 oz)  61.7 kg (136 lb 0.4 oz)  SpO2:  94% 98% 100%    Intake/Output Summary (Last 24 hours) at 04/01/13 1140 Last data filed at 04/01/13 0843  Gross per 24 hour  Intake    650 ml  Output   1201 ml  Net   -551 ml    LABS: Basic Metabolic Panel:  Recent Labs  40/98/11 0430 04/01/13 0520  NA 137 143  K 3.3* 4.1  CL 105 105  CO2 26 28  GLUCOSE 83 70  BUN 30* 25*  CREATININE 1.07 1.00  CALCIUM 7.5* 8.0*  MG 1.9  --    Liver Function Tests:  Recent Labs  03/30/13 0420  AST 32  ALT  27  ALKPHOS 118*  BILITOT 1.2  PROT 5.2*  ALBUMIN 1.8*   No results found for this basename: LIPASE, AMYLASE,  in the last 72 hours CBC:  Recent Labs  03/30/13 0420 04/01/13 0520  WBC 17.8* 6.3  NEUTROABS 16.1*  --   HGB 9.9* 11.0*  HCT 28.4* 31.8*  MCV 85.3 86.9  PLT 55* 107*    PHYSICAL EXAM General: NAD Neck: JVP 10 cm, no thyromegaly or thyroid nodule.  Lungs: Slightly decreased breath sounds at bases. CV: Lateral PMI.  Heart regular S1/S2, no S3/S4, 2/6 HSM at apex, 1/6 early diastolic murmur left sternal border.  1+ edema 1/4 up lower leg on left. Abdomen: Soft, nontender, no hepatosplenomegaly, no distention.  Neurologic: Alert and oriented x 3.  Psych: Normal affect. Extremities: No clubbing or cyanosis. S/p right AKA.   TELEMETRY: Reviewed telemetry pt in NSR  ASSESSMENT AND PLAN: 67 yo with ischemic right lower leg s/p right AKA complicated by post-op E  coli bacteremia and septic shock.  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  1. ID: E coli bacteremia post-op right AKA.  He remains on ceftriaxone.  Vitals stable, WBCs considerably decreased compared to 12/28.  2. Neuro: Patient is alert this morning and answers questions.  No acute ETOH withdrawal.  3. Acute systolic CHF: EF 82% on echo with RV dilation and moderate to severe AI and MR.  Etiology of CMP is uncertain.  He has history of heavy ETOH, so could be ETOH CMP.  He additionally has significant PAD so CAD cannot be ruled out.  He does remain volume overloaded on exam today with elevated JVP.  Ultimately, he will need RHC/LHC to define etiology of cardiomyopathy.  - Possible RHC/LHC tomorrow.  Platelets have improved to 107 and he appears to be less confused than in the past.  There is the issue of the maroon stool this morning, but hemoglobin is higher today than yesterday.  Will have to follow this closely over the next day or so.  - Continue current Coreg.  I will transition captopril over to  lisinopril.  I will add spironolactone 12.5 mg daily.  - I will put him back on IV Lasix as he does look volume overloaded.  Lasix 40 mg IV bid, will hold am dose prior to cath if we are able to do cath tomorrow.  - Long-term prognosis here is likely poor.  I am not sure how much insight into his situation patient will have.  4. PAD: s/p right AKA, may need revascularization on left.  Continue ASA and statin.  5. GI: Maroon stool last night, apparently was not sent for guaiac.  Hemoglobin higher today, however, compared to prior.  Will repeat CBC this pm.  He is on IV protonix.  At risk for gastritis, etc with ETOH abuse.  If there is not a significant drop in hemoglobin, will do LHC at least for diagnostic purposes.   Marca Ancona 04/01/2013 11:52 AM
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
## 2013-04-01 NOTE — Progress Notes (Signed)
Patient transferred off this social worker's floor. CSW left hand off for the 4E social worker who will follow up with patient.  Maree Krabbe, MSW, Theresia Majors [phone]

## 2013-04-01 NOTE — Care Management Note (Addendum)
    Page 1 of 2   04/04/2013     3:10:12 PM   CARE MANAGEMENT NOTE 04/04/2013  Patient:  Rodney, [name]   Account Number:  [id number]  Date Initiated:  03/26/2013  Documentation initiated by:  [facility]  Subjective/Objective Assessment:   tx from [facility] - intubated - septic - CHF.     Action/Plan:   Anticipated DC Date:  04/02/2013   Anticipated DC Plan:  SKILLED NURSING FACILITY  In-house referral  Clinical Social Worker      DC Planning Services  CM consult      Choice offered to / List presented to:             Status of service:  In process, will continue to follow Medicare Important Message given?   (If response is "NO", the following Medicare IM given date fields will be blank) Date Medicare IM given:   Date Additional Medicare IM given:    Discharge Disposition:    Per UR Regulation:  Reviewed for med. necessity/level of care/duration of stay  If discussed at Long Length of Stay Meetings, dates discussed:    Comments:  04/04/2013 Hx/o Right AKA 03/26/2013 Cardiac MRI scheduled for today Dispositon Plan:  SNF (SW active) Crystal Hutchinson RN, BSN, MSHL, CCM 04/04/2012 [phone])  04/01/2013 Patient transfer to 4E05 received From Home/alone. PT/OT Recs: SNF Disposition Plan:  pending. Crystal Hutchinson RN, BSN, MSHL, CCM 04/01/2013 ([address])  Contact: Smith,Victoria Other [phone]                Marlou Sa [phone]

## 2013-04-01 NOTE — Progress Notes (Signed)
Hypoglycemic Event  CBG: 65  Treatment: 15 GM carbohydrate snack  Symptoms: None  Follow-up CBG: Time:3:12 AM CBG Result:73  Possible Reasons for Event: Unknown  Comments/MD notified:MD notified    Rodney [name]  Remember to initiate Hypoglycemia Order Set & complete

## 2013-04-01 NOTE — Progress Notes (Signed)
Tech notified nurse to come see patient's large stool. Appearance of stool look like coffee ground stool with dark blood noted. Moderated to large amount of dark red blood noted in stool. Patient complained of no pain or discomfort. Did not give Heparin SQ this morning due to seeing blood in stool. Notified On-call hospitalist via text page. Will continue to monitor patient to end of shift and also notify oncoming nurse.

## 2013-04-01 NOTE — Progress Notes (Signed)
[facility] Progress Note  04/01/2013 12:16 PM POD 6  Subjective:  Sleeping soundly-awakes easily   Filed Vitals:   04/01/13 0427  BP: 126/68  Pulse: 84  Temp: 97.9 F (36.6 C)  Resp: 17    Physical Exam: Incisions:  Right AKA incision is healing nicely with staples in tact. Extremities:  Left foot is warm.  CBC    Component Value Date/Time   WBC 6.3 04/01/2013 0520   RBC 3.66* 04/01/2013 0520   HGB 11.0* 04/01/2013 0520   HCT 31.8* 04/01/2013 0520   PLT 107* 04/01/2013 0520   MCV 86.9 04/01/2013 0520   MCH 30.1 04/01/2013 0520   MCHC 34.6 04/01/2013 0520   RDW 16.7* 04/01/2013 0520   LYMPHSABS 1.0 03/30/2013 0420   MONOABS 0.7 03/30/2013 0420   EOSABS 0.0 03/30/2013 0420   BASOSABS 0.0 03/30/2013 0420    BMET    Component Value Date/Time   NA 143 04/01/2013 0520   K 4.1 04/01/2013 0520   CL 105 04/01/2013 0520   CO2 28 04/01/2013 0520   GLUCOSE 70 04/01/2013 0520   BUN 25* 04/01/2013 0520   CREATININE 1.00 04/01/2013 0520   CALCIUM 8.0* 04/01/2013 0520   GFRNONAA 75* 04/01/2013 0520   GFRAA 87* 04/01/2013 0520    INR    Component Value Date/Time   INR 2.12* 03/26/2013 1055     Intake/Output Summary (Last 24 hours) at 04/01/13 1216 Last data filed at 04/01/13 1205  Gross per 24 hour  Intake    600 ml  Output    952 ml  Net   -352 ml   ABI's 03/31/13:  RIGHT    LEFT     PRESSURE  WAVEFORM   PRESSURE  WAVEFORM   BRACHIAL  120  Triphasic  BRACHIAL  118  Triphasic   DP  AKA   DP  68  Dampened Monophasic   PT  AKA   PT  60  Severely Dampened Monophasic     RIGHT  LEFT   ABI  AKA  0.57   Right ABI Not obtained due to AKA. Left ABI indicates a moderate to severe reduction in arterial flow.   Assessment/Plan:  67 y.o. male is s/p right above knee amputation  POD 6  -pt doing well with right AKA -may need intervention to left leg at later date-ABI's as above.  Pt has no complaints of pain in left foot. -right AKA is  viable -return to see Dr. Arbie Cookey in 4 weeks for staple removal.   Doreatha Massed, PA-C [facility] [phone] 04/01/2013 12:16 PM

## 2013-04-01 NOTE — Progress Notes (Signed)
Notified hospitalist via text page of patient's hypoglycemic event. Will continue to monitor to end of shift.

## 2013-04-01 NOTE — Progress Notes (Signed)
Patient given another cup of orange juice and CBG was rechecked. Result of CBG this morning for 4AM CBG- 80. Patient states he feels fine and is a little irritable for he almost refused drinking anymore orange juice for he felt he was drinking too much. Explained reason to drink O.J. and encouraged patient and he therefore agreed to drink. Will continue to monitor to end of shift.

## 2013-04-02 ENCOUNTER — Encounter (HOSPITAL_COMMUNITY)
Admission: EM | Disposition: A | Discharge status: Skilled Nursing Facility | Payer: Self-pay | Source: Home / Self Care | Attending: Critical Care Medicine

## 2013-04-02 DIAGNOSIS — I2510 Atherosclerotic heart disease of native coronary artery without angina pectoris: Secondary | ICD-10-CM

## 2013-04-02 HISTORY — PX: LEFT AND RIGHT HEART CATHETERIZATION WITH CORONARY ANGIOGRAM: SHX5449

## 2013-04-02 LAB — BASIC METABOLIC PANEL
CO2: 32 mEq/L (ref 19–32)
Calcium: 8 mg/dL — ABNORMAL LOW (ref 8.4–10.5)
Chloride: 103 mEq/L (ref 96–112)
Creatinine, Ser: 0.84 mg/dL (ref 0.50–1.35)
GFR calc Af Amer: >90 mL/min (ref >90)
Sodium: 141 mEq/L (ref 137–147)

## 2013-04-02 LAB — CBC
HCT: 31.7 % — ABNORMAL LOW (ref 39.0–52.0)
HCT: 32.7 % — ABNORMAL LOW (ref 39.0–52.0)
MCH: 30.6 pg (ref 26.0–34.0)
MCH: 29.3 pg (ref 26.0–34.0)
MCH: 29.9 pg (ref 26.0–34.0)
MCHC: 35 g/dL (ref 30.0–36.0)
MCHC: 33.9 g/dL (ref 30.0–36.0)
MCV: 87.3 fL (ref 78.0–100.0)
MCV: 88 fL (ref 78.0–100.0)
MCV: 88.1 fL (ref 78.0–100.0)
Platelets: 120 10*3/uL — ABNORMAL LOW (ref 150–400)
Platelets: 116 10*3/uL — ABNORMAL LOW (ref 150–400)
Platelets: 116 10*3/uL — ABNORMAL LOW (ref 150–400)
RBC: 3.63 MIL/uL — ABNORMAL LOW (ref 4.22–5.81)
RBC: 3.58 MIL/uL — ABNORMAL LOW (ref 4.22–5.81)
RDW: 16.6 % — ABNORMAL HIGH (ref 11.5–15.5)
RDW: 16.8 % — ABNORMAL HIGH (ref 11.5–15.5)
WBC: 6.9 10*3/uL (ref 4.0–10.5)

## 2013-04-02 LAB — CREATININE, SERUM
Creatinine, Ser: 0.77 mg/dL (ref 0.50–1.35)
GFR calc Af Amer: >90 mL/min (ref >90)

## 2013-04-02 LAB — SURGICAL PCR SCREEN
MRSA, PCR: NEGATIVE
Staphylococcus aureus: NEGATIVE

## 2013-04-02 LAB — GLUCOSE, CAPILLARY
Glucose-Capillary: 86 mg/dL (ref 70–99)
Glucose-Capillary: 64 mg/dL — ABNORMAL LOW (ref 70–99)

## 2013-04-02 LAB — PROTIME-INR
INR: 1.25 (ref 0.00–1.49)
Prothrombin Time: 15.4 seconds — ABNORMAL HIGH (ref 11.6–15.2)

## 2013-04-02 SURGERY — LEFT AND RIGHT HEART CATHETERIZATION WITH CORONARY ANGIOGRAM
Anesthesia: LOCAL

## 2013-04-02 SURGERY — LEFT AND RIGHT HEART CATHETERIZATION WITH CORONARY ANGIOGRAM
Anesthesia: Moderate Sedation

## 2013-04-02 MED ORDER — ONDANSETRON HCL 4 MG/2ML IJ SOLN: 4.0000 mg | Freq: Four times a day (QID) | INTRAMUSCULAR | Status: DC | PRN

## 2013-04-02 MED ORDER — NITROGLYCERIN 0.2 MG/ML ON CALL CATH LAB
INTRAVENOUS | Status: AC
  Filled 2013-04-02: qty 1

## 2013-04-02 MED ORDER — HEPARIN SODIUM (PORCINE) 1000 UNIT/ML IJ SOLN
INTRAMUSCULAR | Status: AC
  Filled 2013-04-02: qty 1

## 2013-04-02 MED ORDER — FUROSEMIDE 40 MG PO TABS
40.0000 mg | ORAL_TABLET | Freq: Every day | ORAL | Status: DC
  Administered 2013-04-03 – 2013-04-07 (×4): 40 mg via ORAL
  Filled 2013-04-02 (×5): qty 1

## 2013-04-02 MED ORDER — SODIUM CHLORIDE 0.9 % IJ SOLN
3.0000 mL | Freq: Two times a day (BID) | INTRAMUSCULAR | Status: DC
  Administered 2013-04-05 – 2013-04-06 (×2): 3 mL via INTRAVENOUS

## 2013-04-02 MED ORDER — ATORVASTATIN CALCIUM 40 MG PO TABS
40.0000 mg | ORAL_TABLET | Freq: Every day | ORAL | Status: DC
  Administered 2013-04-02 – 2013-04-07 (×6): 40 mg via ORAL
  Filled 2013-04-02 (×6): qty 1

## 2013-04-02 MED ORDER — SODIUM CHLORIDE 0.9 % IV SOLN: 250.0000 mL | INTRAVENOUS | Status: DC | PRN

## 2013-04-02 MED ORDER — MIDAZOLAM HCL 2 MG/2ML IJ SOLN
INTRAMUSCULAR | Status: AC
  Filled 2013-04-02: qty 2

## 2013-04-02 MED ORDER — SODIUM CHLORIDE 0.9 % IJ SOLN: 3.0000 mL | INTRAMUSCULAR | Status: DC | PRN

## 2013-04-02 MED ORDER — VERAPAMIL HCL 2.5 MG/ML IV SOLN
INTRAVENOUS | Status: AC
  Filled 2013-04-02: qty 2

## 2013-04-02 MED ORDER — LISINOPRIL 5 MG PO TABS
5.0000 mg | ORAL_TABLET | Freq: Two times a day (BID) | ORAL | Status: DC
  Administered 2013-04-02 – 2013-04-07 (×10): 5 mg via ORAL
  Filled 2013-04-02 (×13): qty 1

## 2013-04-02 MED ORDER — HEPARIN SODIUM (PORCINE) 5000 UNIT/ML IJ SOLN: 5000.0000 [IU] | Freq: Three times a day (TID) | INTRAMUSCULAR | Status: DC

## 2013-04-02 MED ORDER — ACETAMINOPHEN 325 MG PO TABS: 650.0000 mg | ORAL_TABLET | ORAL | Status: DC | PRN

## 2013-04-02 MED ORDER — LIDOCAINE HCL (PF) 1 % IJ SOLN
INTRAMUSCULAR | Status: AC
  Filled 2013-04-02: qty 30

## 2013-04-02 MED ORDER — HEPARIN (PORCINE) IN NACL 2-0.9 UNIT/ML-% IJ SOLN
INTRAMUSCULAR | Status: AC
  Filled 2013-04-02: qty 1000

## 2013-04-02 MED ORDER — PANTOPRAZOLE SODIUM 40 MG PO TBEC
40.0000 mg | DELAYED_RELEASE_TABLET | Freq: Every day | ORAL | Status: DC
  Administered 2013-04-03 – 2013-04-07 (×5): 40 mg via ORAL
  Filled 2013-04-02 (×5): qty 1

## 2013-04-02 NOTE — Progress Notes (Signed)
I agree with the above noted from [facility].  I have seen and evaluated the patient.  His stump is healing nicely.  He will need a followup in 4 weeks with Dr. Imogene Burn for staple removal in the office  Rodney [name]

## 2013-04-02 NOTE — Progress Notes (Signed)
PROGRESS NOTE  Rodney [name] ZOX:[id number] DOB: 01-08-1946 DOA: 03/25/2013 PCP: No PCP Per Patient  HPI/Brief narrative 67 year old male with history of hypertension, alcohol & tobacco abuse abuse and acute systolic CHF was transferred from [facility] with ischemic right lower extremity/nonhealing wound, sepsis, shock and was admitted by critical care team for VDRF. 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 Cardiology seeing for acute systolic CHF and dilated cardiomyopathy. After he stabilized, his care was transferred to the triad hospitalist on 04/01/13.  Assessment/Plan: Septic shock/Escherichia coli & Morganella morganii bacteremia - Secondary to Escherichia coli & Morganella morganii bacteremia-? Source. Antibiotics narrowed to ceftriaxone alone and plan for 10 days antibiotics until 04/03/13. No UA or urine culture sent on admission. - today day 9/10 Acute respiratory failure - Secondary to decompensated CHF, RLL CAP and septic shock, extubated 12/27. - Stable. Acute systolic CHF/EF 15%/dilated cardiomyopathy - Etiology of cardiomyopathy is uncertain. 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 However given history of PAD, CAD needs to be ruled out. Continue diuresis as per cardiology. Continue Coreg, captopril changed to lisinopril and Aldactone added by cardiology. - cath today, f/u on results.  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 He may need intervention to left leg at later date but has no complaints of left foot pain at this time. Patient needs to followup with Dr. Tawanna Cooler Early in 4 weeks for staple removal. Hypertension- Controlled History of alcohol abuse- No features of alcohol withdrawal at this time. Currently on Librium, thiamine and multivitamins.  Tobacco abuse - Cessation counseled Hypokalemia - Replaced Anemia - Hemoglobin  stable Thrombocytopenia - Likely secondary to sepsis. Platelets gradually increasing ?GI bleed - FOBT pending. IV Protonix. CBC stable.   Code Status: Full Family Communication: None at bedside Disposition Plan: To be determined  Consultants:  Vascular surgery  Cardiology  Critical care medicine  Procedures:  Right AKA  R Hubbard CVL 12/24   ETT 12/24 >> 12/27   R rad art line 12/24>>12/25  Antibiotics: 12/23 doxy>>12/24  12/23 vanco12/24  12/23 zosyn -DC'd 12/24 cipro>>12/26 IV Rocephin  Subjective: - has no complaints today, awaiting cath  Objective: Filed Vitals:   04/02/13 1305 04/02/13 1329 04/02/13 1357 04/02/13 1500  BP: 145/63 121/55 114/56 113/54  Pulse: 80 74 73 73  Temp:  98.8 F (37.1 C) 98.8 F (37.1 C) 98.8 F (37.1 C)  TempSrc:  Oral Oral Oral  Resp:      Height:      Weight:      SpO2:        Intake/Output Summary (Last 24 hours) at 04/02/13 1612 Last data filed at 04/02/13 1452  Gross per 24 hour  Intake    270 ml  Output    300 ml  Net    -30 ml   Filed Weights   04/01/13 0427 04/01/13 1337 04/02/13 0641  Weight: 61.7 kg (136 lb 0.4 oz) 61.7 kg (136 lb 0.4 oz) 59.1 kg (130 lb 4.7 oz)   Exam: General exam: Moderately built and poorly nourished, older than stated age, male lying comfortably in bed. Respiratory system: Clear. No increased work of breathing. Cardiovascular system: S1 & S2 heard, RRR. No JVD, murmurs, gallops, clicks. 1+ pitting left ankle edema. Telemetry: Sinus rhythm Gastrointestinal system: Abdomen is nondistended, soft and nontender. Normal bowel sounds heard. Central nervous system: Alert and oriented. No focal neurological deficits. Extremities: Symmetric 5 x 5 power.  Right AKA: Stump clean and staples intact.  Data Reviewed: Basic Metabolic Panel:  Recent Labs Lab 03/27/13 0430 03/27/13 2335  03/29/13 0425 03/30/13 0420 03/30/13 2100 03/31/13 0430 04/01/13 0520 04/02/13 0550 04/02/13 1434  NA 137 136   < > 134* 133* 136 137 143 141  --   K 3.1* 3.0*  < > 3.3* 2.9* 3.1* 3.3* 4.1 4.0  --   CL 103 103  < > 102 100 102 105 105 103  --   CO2 23 23  < > 22 25 25 26 28  32  --   GLUCOSE 98 110*  < > 124* 94 119* 83 70 75  --   BUN 23 22  < > 29* 31* 31* 30* 25* 18  --   CREATININE 1.13 1.00  < > 0.91 1.03 1.12 1.07 1.00 0.84 0.77  CALCIUM 7.0* 7.2*  < > 7.2* 7.3* 7.5* 7.5* 8.0* 8.0*  --   MG  --  1.6  --  2.3  --   --  1.9  --   --   --   PHOS  --  2.2*  --  2.2*  --   --   --   --   --   --   < > = values in this interval not displayed. Liver Function Tests:  Recent Labs Lab 03/30/13 0420  AST 32  ALT 27  ALKPHOS 118*  BILITOT 1.2  PROT 5.2*  ALBUMIN 1.8*   CBC:  Recent Labs Lab 03/29/13 0425 03/30/13 0420  04/01/13 1225 04/01/13 1837 04/02/13 0009 04/02/13 0550 04/02/13 1434  WBC 17.3* 17.8*  < > 7.0 7.3 6.9 6.9 8.8  NEUTROABS 16.2* 16.1*  --   --   --   --   --   --   HGB 10.1* 9.9*  < > 12.7* 11.1* 11.1* 10.5* 11.1*  HCT 29.0* 28.4*  < > 37.1* 32.7* 31.7* 31.5* 32.7*  MCV 84.8 85.3  < > 88.3 88.1 87.3 88.0 88.1  PLT 52* 55*  < > 120* 124* 120* 116* 116*  < > = values in this interval not displayed. BNP (last 3 results)  Recent Labs  03/25/13 1240  PROBNP 14339.0*   CBG:  Recent Labs Lab 04/01/13 1101 04/01/13 1630 04/01/13 2018 04/02/13 0103 04/02/13 0352  GLUCAP 75 75 105* 86 72    Recent Results (from the past 240 hour(s))  CULTURE, BLOOD (ROUTINE X 2)     Status: None   Collection Time    03/25/13  5:34 PM      Result Value Range Status   Specimen Description BLOOD RIGHT FOREARM   Final   Special Requests BOTTLES DRAWN AEROBIC ONLY 3CC   Final   Culture  Setup Time     Final   Value: 03/26/2013 14:11     Performed at [facility]   Culture     Final   Value: ESCHERICHIA COLI     Note: Gram Stain Report Called to,Read Back By and Verified With: Concepcion Living 2300 1018 03/26/13 BY Ginette Pitman     Performed at [facility]   Report  Status 03/28/2013 FINAL   Final   Organism ID, Bacteria ESCHERICHIA COLI   Final  CULTURE, BLOOD (ROUTINE X 2)     Status: None   Collection Time    03/25/13  9:59 PM      Result Value Range Status   Specimen Description BLOOD RIGHT HAND  Final   Special Requests BOTTLES DRAWN AEROBIC AND ANAEROBIC [facility]   Final   Culture  Setup Time     Final   Value: 03/27/2013 23:41     Performed at [facility]   Culture     Final   Value: ESCHERICHIA COLI     MORGANELLA MORGANII     Note: Gram Stain Report Called to,Read Back By and Verified With:  LIZ M @0725  ON 03/28/13 BY SMIAS     Performed at [facility]   Report Status 03/31/2013 FINAL   Final   Organism ID, Bacteria ESCHERICHIA COLI   Final   Organism ID, Bacteria MORGANELLA MORGANII   Final  MRSA PCR SCREENING     Status: None   Collection Time    03/25/13 11:12 PM      Result Value Range Status   MRSA by PCR NEGATIVE  NEGATIVE Final   Comment:            The GeneXpert MRSA Assay (FDA     approved for NASAL specimens     only), is one component of a     comprehensive MRSA colonization     surveillance program. It is not     intended to diagnose MRSA     infection nor to guide or     monitor treatment for     MRSA infections.  SURGICAL PCR SCREEN     Status: None   Collection Time    04/02/13  8:40 AM      Result Value Range Status   MRSA, PCR NEGATIVE  NEGATIVE Final   Staphylococcus aureus NEGATIVE  NEGATIVE Final   Comment:            The Xpert SA Assay (FDA     approved for NASAL specimens     in patients over 31 years of age),     is one component of     a comprehensive surveillance     program.  Test performance has     been validated by [employer] for patients greater     than or equal to 37 year old.     It is not intended     to diagnose infection nor to     guide or monitor treatment.   Scheduled Meds: . antiseptic oral rinse  15 mL Mouth Rinse BID  . aspirin  81 mg Oral Daily  .  atorvastatin  40 mg Oral q1800  . carvedilol  3.125 mg Oral BID WC  . cefTRIAXone (ROCEPHIN)  IV  1 g Intravenous Q24H  . chlordiazePOXIDE  10 mg Oral Daily  . feeding supplement (ENSURE COMPLETE)  237 mL Oral BID BM  . folic acid  1 mg Oral Daily  . [START ON 04/03/2013] furosemide  40 mg Oral Daily  . heparin subcutaneous  5,000 Units Subcutaneous Q8H  . lisinopril  5 mg Oral BID  . pantoprazole  40 mg Oral Daily  . sodium chloride  10-40 mL Intracatheter Q12H  . sodium chloride  3 mL Intravenous Q12H  . [START ON 04/03/2013] sodium chloride  3 mL Intravenous Q12H  . spironolactone  12.5 mg Oral Daily  . thiamine  100 mg Per Tube Daily   Or  . thiamine  100 mg Intravenous Daily   Continuous Infusions:   Principal Problem:   Septic shock(785.52) Active Problems:   CHF (congestive heart failure)   HTN (hypertension)  Open wound of right foot   Ischemic foot   Smoking hx   Alcohol abuse   Bilateral leg edema   Severe sepsis(995.92)   Severe protein-calorie malnutrition   Septic shock   Acute respiratory failure with hypoxia   Alcohol withdrawal delirium   Acute systolic CHF (congestive heart failure), NYHA class 4  Time spent: 35 minutes.  Rodney Kackley M. Elvera Lennox, MD [facility] [phone]  If 7PM-7AM, please contact night-coverage [email or web address] Password [email or web address] 04/02/2013, 4:12 PM    LOS: 8 days

## 2013-04-02 NOTE — Progress Notes (Signed)
PT Cancellation Note (late entry for am)  Patient Details Name: Rodney [name] MRN: [id number] DOB: 22-Jan-1946   Cancelled Treatment:    Reason Eval/Treat Not Completed: Patient at procedure or test/unavailable  Was at Heart Cath earlier today when PT session was attempted   [name] [facility] 04/02/2013, 2:51 PM

## 2013-04-02 NOTE — Interval H&P Note (Signed)
Cath Lab Visit (complete for each Cath Lab visit)  Clinical Evaluation Leading to the Procedure:   ACS: no  Non-ACS:    Anginal Classification: CCS III  Anti-ischemic medical therapy: Minimal Therapy (1 class of medications)  Non-Invasive Test Results: No non-invasive testing performed  Prior CABG: No previous CABG      History and Physical Interval Note:  04/02/2013 11:25 AM  Rodney [name]  has presented today for surgery, with the diagnosis of cardiomyopathy  The various methods of treatment have been discussed with the patient and family. After consideration of risks, benefits and other options for treatment, the patient has consented to  Procedure(s): LEFT AND RIGHT HEART CATHETERIZATION WITH CORONARY ANGIOGRAM (N/A) as a surgical intervention .  The patient's history has been reviewed, patient examined, no change in status, stable for surgery.  I have reviewed the patient's chart and labs.  Questions were answered to the patient's satisfaction.     Rodney [name] [employer]

## 2013-04-02 NOTE — Clinical Social Work Psychosocial (Signed)
Clinical Social Work Department BRIEF PSYCHOSOCIAL ASSESSMENT 04/02/2013  Patient:  MONIQUE, GIFT     Account Number:  [id number]     Admit date:  03/25/2013  Clinical Social Worker:  Cristy Folks  Date/Time:  04/02/2013 10:15 AM  Referred by:  Physician  Date Referred:  04/02/2013 Referred for  Other - See comment   Other Referral:   Possible SNF placement for rehab   Interview type:  Family Other interview type:   Pt. seemed disoriented could not answer questions    PSYCHOSOCIAL DATA Living Status:  ALONE Admitted from facility:   Level of care:   Primary support name:   Primary support relationship to patient:   Degree of support available:    CURRENT CONCERNS Current Concerns  Other - See comment   Other Concerns:   Possible SNF for rehab    SOCIAL WORK ASSESSMENT / PLAN CSW attempted to speak with pt. but seemed a disoriented and was not able to answer questions. CSW asked permission to call his cousin lsted as his next of kin on face sheet. Pt. recognized the name and agreed.CSW spoke with Sandy Salaam [phone]. Pt.'s cousin reported that pt. would not be able to discharge to her home because she is not able to care for him. Pt.'s cousin reports that pt. has no running water and electricity due to non-payment.Pt.'s cousin reported that pt. would be returning to an unsafe living environment.Pt. has 2 sisters that live in [place] but they are not involved in his care. Pt. has been scheduled for a heart cath today.   Assessment/plan status:  Psychosocial Support/Ongoing Assessment of Needs Other assessment/ plan:   Possible SNF placement for rehab   Information/referral to community resources:    PATIENT'S/FAMILY'S RESPONSE TO PLAN OF CARE: Pt.'s family was appreciative of the information recieved from CSW. CSW will keep pt. and family aware of discharge plan.   [address] [place] [phone]

## 2013-04-02 NOTE — Significant Event (Signed)
Patient trqansported to heart cath via bed VSS .

## 2013-04-02 NOTE — H&P (View-Only) (Signed)
Patient ID: Rodney [name], male   DOB: 04/25/1945, 67 y.o.   MRN: [id number]   SUBJECTIVE: No complaints to me today.  Denies chest pain or dyspnea at rest.  Patient had a maroon-colored stool last night with concern for blood in stool but hemoglobin this morning is higher than yesterday.  It does not appear that stool guaiac was done.   . antiseptic oral rinse  15 mL Mouth Rinse BID  . aspirin  81 mg Oral Daily  . atorvastatin  20 mg Oral q1800  . carvedilol  3.125 mg Oral BID WC  . cefTRIAXone (ROCEPHIN)  IV  1 g Intravenous Q24H  . chlordiazePOXIDE  10 mg Oral Daily  . feeding supplement (ENSURE COMPLETE)  237 mL Oral BID BM  . folic acid  1 mg Oral Daily  . furosemide  40 mg Intravenous BID  . heparin subcutaneous  5,000 Units Subcutaneous Q8H  . insulin aspart  0-15 Units Subcutaneous Q4H  . lisinopril  2.5 mg Oral BID  . pantoprazole (PROTONIX) IV  40 mg Intravenous Q24H  . sodium chloride  10-40 mL Intracatheter Q12H  . sodium chloride  3 mL Intravenous Q12H  . spironolactone  12.5 mg Oral Daily  . thiamine  100 mg Per Tube Daily   Or  . thiamine  100 mg Intravenous Daily      Filed Vitals:   03/31/13 1607 03/31/13 1732 03/31/13 2126 04/01/13 0427  BP:  119/61 127/63 126/68  Pulse:  81 87 84  Temp: 97.6 F (36.4 C) 97.6 F (36.4 C) 98.5 F (36.9 C) 97.9 F (36.6 C)  TempSrc: Oral Oral Oral Oral  Resp:  16 18 17  Height:  5' 8" (1.727 m)    Weight:  68.6 kg (151 lb 3.8 oz)  61.7 kg (136 lb 0.4 oz)  SpO2:  94% 98% 100%    Intake/Output Summary (Last 24 hours) at 04/01/13 1140 Last data filed at 04/01/13 0843  Gross per 24 hour  Intake    650 ml  Output   1201 ml  Net   -551 ml    LABS: Basic Metabolic Panel:  Recent Labs  03/31/13 0430 04/01/13 0520  NA 137 143  K 3.3* 4.1  CL 105 105  CO2 26 28  GLUCOSE 83 70  BUN 30* 25*  CREATININE 1.07 1.00  CALCIUM 7.5* 8.0*  MG 1.9  --    Liver Function Tests:  Recent Labs  03/30/13 0420  AST 32  ALT  27  ALKPHOS 118*  BILITOT 1.2  PROT 5.2*  ALBUMIN 1.8*   No results found for this basename: LIPASE, AMYLASE,  in the last 72 hours CBC:  Recent Labs  03/30/13 0420 04/01/13 0520  WBC 17.8* 6.3  NEUTROABS 16.1*  --   HGB 9.9* 11.0*  HCT 28.4* 31.8*  MCV 85.3 86.9  PLT 55* 107*    PHYSICAL EXAM General: NAD Neck: JVP 10 cm, no thyromegaly or thyroid nodule.  Lungs: Slightly decreased breath sounds at bases. CV: Lateral PMI.  Heart regular S1/S2, no S3/S4, 2/6 HSM at apex, 1/6 early diastolic murmur left sternal border.  1+ edema 1/4 up lower leg on left. Abdomen: Soft, nontender, no hepatosplenomegaly, no distention.  Neurologic: Alert and oriented x 3.  Psych: Normal affect. Extremities: No clubbing or cyanosis. S/p right AKA.   TELEMETRY: Reviewed telemetry pt in NSR  ASSESSMENT AND PLAN: 67 yo with ischemic right lower leg s/p right AKA complicated by post-op E   coli bacteremia and septic shock.  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  1. ID: E coli bacteremia post-op right AKA.  He remains on ceftriaxone.  Vitals stable, WBCs considerably decreased compared to 12/28.  2. Neuro: Patient is alert this morning and answers questions.  No acute ETOH withdrawal.  3. Acute systolic CHF: EF 15% on echo with RV dilation and moderate to severe AI and MR.  Etiology of CMP is uncertain.  He has history of heavy ETOH, so could be ETOH CMP.  He additionally has significant PAD so CAD cannot be ruled out.  He does remain volume overloaded on exam today with elevated JVP.  Ultimately, he will need RHC/LHC to define etiology of cardiomyopathy.  - Possible RHC/LHC tomorrow.  Platelets have improved to 107 and he appears to be less confused than in the past.  There is the issue of the maroon stool this morning, but hemoglobin is higher today than yesterday.  Will have to follow this closely over the next day or so.  - Continue current Coreg.  I will transition captopril over to  lisinopril.  I will add spironolactone 12.5 mg daily.  - I will put him back on IV Lasix as he does look volume overloaded.  Lasix 40 mg IV bid, will hold am dose prior to cath if we are able to do cath tomorrow.  - Long-term prognosis here is likely poor.  I am not sure how much insight into his situation patient will have.  4. PAD: s/p right AKA, may need revascularization on left.  Continue ASA and statin.  5. GI: Maroon stool last night, apparently was not sent for guaiac.  Hemoglobin higher today, however, compared to prior.  Will repeat CBC this pm.  He is on IV protonix.  At risk for gastritis, etc with ETOH abuse.  If there is not a significant drop in hemoglobin, will do LHC at least for diagnostic purposes.   Pamelyn Bancroft 04/01/2013 11:52 AM   

## 2013-04-02 NOTE — CV Procedure (Signed)
    Cardiac Catheterization Procedure Note  Name: Rodney [name] MRN: [id number] DOB: 05/15/1945  Procedure: Left Heart Cath, Selective Coronary Angiography  Indication: Cardiomyopathy of uncertain etiology.    Procedural Details: The right wrist was prepped, draped, and anesthetized with 1% lidocaine. Using the modified Seldinger technique, a 5 French sheath was introduced into the right radial artery. 3 mg of verapamil was administered through the sheath, weight-based unfractionated heparin was administered intravenously. Standard Judkins catheters were used for selective coronary angiography. Catheter exchanges were performed over an exchange length guidewire. There were no immediate procedural complications. A TR band was used for radial hemostasis at the completion of the procedure.  The patient was transferred to the post catheterization recovery area for further monitoring.  Procedural Findings: Hemodynamics (mmHg): AO 122/55 LV 123/13 RA mean 4 (from central line)  Coronary angiography: Coronary dominance: right  Left mainstem: 30% proximal LM.   Left anterior descending (LAD): 40% ostial LAD stenosis.  Diffusely diseased proximal to mid LAD.  There is a discrete 80-90% proximal LAD stenosis. 50% mid LAD stenosis at D1.  Proximal D1 with 80% ostial stenosis.   Left circumflex (LCx): A small ramus appears subtotally occluded.  Small OM1 is subtotally occluded.  Small to moderate OM2 is occluded proximally. The proximal LCx is ectatic with 50% stenosis.   Right coronary artery (RCA): Diffusely diseased RCA.  40% proximal stenosis.  75% mid RCA stenosis followed by a napkin ring-like lesion with probably 75% stenosis.   Left ventriculography: Not done, recent echo.  Final Conclusions:  Diffuse 3 vessel disease.  Severe RCA and LAD lesions.  Given EF 15% with diffuse HK on echo, I think that cardiomyopathy is at least partly related to ETOH abuse.  Anatomy appears best-suited for  CABG, but he is not going to be a very good CABG candidate. Could potentially intervene on LAD +/- RCA but would question his compliance with meds after discharge.  At this point, will follow him for a few days to see if his mental status improves. Will get cardiac MRI to look for degree of scarring/viability in left ventricle.  Will increase lisinopril today to 5 mg bid.  Can transition Lasix to po as CVP was 4 transduced off PICC line and LVEDP not elevated.   Marca Ancona 04/02/2013, 12:11 PM

## 2013-04-03 DIAGNOSIS — I2510 Atherosclerotic heart disease of native coronary artery without angina pectoris: Secondary | ICD-10-CM

## 2013-04-03 LAB — GLUCOSE, CAPILLARY
GLUCOSE-CAPILLARY: 92 mg/dL (ref 70–99)
Glucose-Capillary: 124 mg/dL — ABNORMAL HIGH (ref 70–99)
Glucose-Capillary: 106 mg/dL — ABNORMAL HIGH (ref 70–99)
Glucose-Capillary: 69 mg/dL — ABNORMAL LOW (ref 70–99)
Glucose-Capillary: 119 mg/dL — ABNORMAL HIGH (ref 70–99)
Glucose-Capillary: 122 mg/dL — ABNORMAL HIGH (ref 70–99)

## 2013-04-03 LAB — BASIC METABOLIC PANEL
BUN: 16 mg/dL (ref 6–23)
CO2: 33 mEq/L — ABNORMAL HIGH (ref 19–32)
Calcium: 8 mg/dL — ABNORMAL LOW (ref 8.4–10.5)
Chloride: 102 mEq/L (ref 96–112)
Creatinine, Ser: 0.75 mg/dL (ref 0.50–1.35)
GFR calc Af Amer: >90 mL/min (ref >90)
GFR calc non Af Amer: >90 mL/min (ref >90)
Glucose, Bld: 77 mg/dL (ref 70–99)
Potassium: 3.9 mEq/L (ref 3.7–5.3)
Sodium: 141 mEq/L (ref 137–147)

## 2013-04-03 LAB — CBC
HCT: 30.6 % — ABNORMAL LOW (ref 39.0–52.0)
Hemoglobin: 10.4 g/dL — ABNORMAL LOW (ref 13.0–17.0)
MCH: 30.1 pg (ref 26.0–34.0)
MCHC: 34 g/dL (ref 30.0–36.0)
MCV: 88.7 fL (ref 78.0–100.0)
Platelets: 140 10*3/uL — ABNORMAL LOW (ref 150–400)
RBC: 3.45 MIL/uL — ABNORMAL LOW (ref 4.22–5.81)
RDW: 16.8 % — ABNORMAL HIGH (ref 11.5–15.5)
WBC: 8 10*3/uL (ref 4.0–10.5)

## 2013-04-03 LAB — TROPONIN I

## 2013-04-03 MED ORDER — CARVEDILOL 6.25 MG PO TABS
6.2500 mg | ORAL_TABLET | Freq: Two times a day (BID) | ORAL | Status: DC
  Administered 2013-04-03 – 2013-04-07 (×9): 6.25 mg via ORAL
  Filled 2013-04-03 (×10): qty 1

## 2013-04-03 NOTE — Progress Notes (Signed)
PROGRESS NOTE  Rodney [name] [id number] DOB: 1946-01-27 DOA: 03/25/2013 PCP: No PCP Per Patient  Assessment/Plan: Septic shock/Escherichia coli & Morganella morganii bacteremia - Secondary to Escherichia coli & Morganella morganii bacteremia-? Source. Antibiotics narrowed to ceftriaxone alone and plan for 10 days antibiotics until 04/03/13. No UA or urine culture sent on admission. - today day 10/10 Acute respiratory failure - Secondary to decompensated CHF, RLL CAP and septic shock, extubated 12/27. - Stable. Acute systolic CHF/EF [id number] cardiomyopathy - Etiology of cardiomyopathy is uncertain. 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  - patient underwent cardiac catheterization 12/31 with RCA and LAD lesions. Cardiac MRI tomorrow.  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 He may need intervention to left leg at later date but has no complaints of left foot pain at this time. Patient needs to followup with Dr. Sherren Mocha Early in 4 weeks for staple removal. Hypertension- Controlled History of alcohol abuse- No features of alcohol withdrawal at this time. Currently on Librium, thiamine and multivitamins.  Tobacco abuse - Cessation counseled Hypokalemia - Replaced Anemia - Hemoglobin stable Thrombocytopenia - Likely secondary to sepsis. Platelets gradually increasing ?GI bleed - FOBT pending. IV Protonix. CBC stable.   Code Status: Full Family Communication: None at bedside Disposition Plan: To be determined  Consultants:  Vascular surgery  Cardiology  Critical care medicine  Procedures:  Right AKA  R [place] CVL 12/24   ETT 12/24 >> 12/27   R rad art line 12/24>>12/25  Antibiotics: 12/23 doxy>>12/24  12/23 vanco12/24  12/23 zosyn -DC'd 12/24 cipro>>12/26 IV Rocephin  Subjective: - has no complaints today, feeling well  Objective: Filed Vitals:   04/02/13 2109  04/02/13 2127 04/03/13 0411 04/03/13 0909  BP: 119/65 107/53 111/56 110/48  Pulse:  78 74 76  Temp:  97.5 F (36.4 C) 97.9 F (36.6 C)   TempSrc:  Oral Oral   Resp:  18 18   Height:      Weight:   58.7 kg (129 lb 6.6 oz)   SpO2:  100% 100%     Intake/Output Summary (Last 24 hours) at 04/03/13 0912 Last data filed at 04/03/13 [id number]  Gross per 24 hour  Intake    270 ml  Output      7 ml  Net    263 ml   Filed Weights   04/01/13 1337 04/02/13 0641 04/03/13 0411  Weight: 61.7 kg (136 lb 0.4 oz) 59.1 kg (130 lb 4.7 oz) 58.7 kg (129 lb 6.6 oz)   Exam: General exam: Moderately built and poorly nourished, older than stated age, male lying comfortably in bed. Respiratory system: Clear. No increased work of breathing. Cardiovascular system: S1 & S2 heard, RRR. No JVD, murmurs, gallops, clicks. trace left ankle edema. Gastrointestinal system: Abdomen is nondistended, soft and nontender. Normal bowel sounds heard. Central nervous system: Alert and oriented. No focal neurological deficits. Extremities: Symmetric 5 x 5 power. Right AKA: Stump clean and staples intact.  Data Reviewed: Basic Metabolic Panel:  Recent Labs Lab 03/27/13 2335  03/29/13 0425  03/30/13 2100 03/31/13 0430 04/01/13 0520 04/02/13 0550 04/02/13 1434 04/03/13 0420  NA 136  < > 134*  < > 136 137 143 141  --  141  K 3.0*  < > 3.3*  < > 3.1* 3.3* 4.1 4.0  --  3.9  CL 103  < > 102  < > 102 105 105 103  --  102  CO2 23  < > 22  < > 25 26 28  32  --  33*  GLUCOSE 110*  < > 124*  < > 119* 83 70 75  --  77  BUN 22  < > 29*  < > 31* 30* 25* 18  --  16  CREATININE 1.00  < > 0.91  < > 1.12 1.07 1.00 0.84 0.77 0.75  CALCIUM 7.2*  < > 7.2*  < > 7.5* 7.5* 8.0* 8.0*  --  8.0*  MG 1.6  --  2.3  --   --  1.9  --   --   --   --   PHOS 2.2*  --  2.2*  --   --   --   --   --   --   --   < > = values in this interval not displayed. Liver Function Tests:  Recent Labs Lab 03/30/13 0420  AST 32  ALT 27  ALKPHOS 118*    BILITOT 1.2  PROT 5.2*  ALBUMIN 1.8*   CBC:  Recent Labs Lab 03/29/13 0425 03/30/13 0420  04/01/13 1837 04/02/13 0009 04/02/13 0550 04/02/13 1434 04/03/13 0420  WBC 17.3* 17.8*  < > 7.3 6.9 6.9 8.8 8.0  NEUTROABS 16.2* 16.1*  --   --   --   --   --   --   HGB 10.1* 9.9*  < > 11.1* 11.1* 10.5* 11.1* 10.4*  HCT 29.0* 28.4*  < > 32.7* 31.7* 31.5* 32.7* 30.6*  MCV 84.8 85.3  < > 88.1 87.3 88.0 88.1 88.7  PLT 52* 55*  < > 124* 120* 116* 116* 140*  < > = values in this interval not displayed. BNP (last 3 results)  Recent Labs  03/25/13 1240  PROBNP 14339.0*   CBG:  Recent Labs Lab 04/02/13 2011 04/02/13 2354 04/03/13 0415 04/03/13 0755 04/03/13 0814  GLUCAP 136* 124* 92 69* 106*   Recent Results (from the past 240 hour(s))  CULTURE, BLOOD (ROUTINE X 2)     Status: None   Collection Time    03/25/13  5:34 PM      Result Value Range Status   Specimen Description BLOOD RIGHT FOREARM   Final   Special Requests BOTTLES DRAWN AEROBIC ONLY 3CC   Final   Culture  Setup Time     Final   Value: 03/26/2013 14:11     Performed at [facility]   Culture     Final   Value: ESCHERICHIA COLI     Note: Gram Stain Report Called to,Read Back By and Verified With: Concepcion Living 2300 1018 03/26/13 BY Ginette Pitman     Performed at [facility]   Report Status 03/28/2013 FINAL   Final   Organism ID, Bacteria ESCHERICHIA COLI   Final  CULTURE, BLOOD (ROUTINE X 2)     Status: None   Collection Time    03/25/13  9:59 PM      Result Value Range Status   Specimen Description BLOOD RIGHT HAND   Final   Special Requests BOTTLES DRAWN AEROBIC AND ANAEROBIC [facility] EACH   Final   Culture  Setup Time     Final   Value: 03/27/2013 23:41     Performed at [facility]   Culture     Final   Value: ESCHERICHIA COLI     MORGANELLA MORGANII     Note: Gram Stain Report Called to,Read Back By and Verified With:  LIZ M @0725  ON 03/28/13 BY SMIAS     Performed at [facility]   Report Status 03/31/2013 FINAL   Final   Organism ID, Bacteria ESCHERICHIA COLI   Final   Organism ID, Bacteria MORGANELLA MORGANII   Final  MRSA PCR SCREENING     Status: None   Collection Time    03/25/13 11:12 PM      Result Value Range Status   MRSA by PCR NEGATIVE  NEGATIVE Final   Comment:            The GeneXpert MRSA Assay (FDA     approved for NASAL specimens     only), is one component of a     comprehensive MRSA colonization     surveillance program. It is not     intended to diagnose MRSA     infection nor to guide or     monitor treatment for     MRSA infections.  SURGICAL PCR SCREEN     Status: None   Collection Time    04/02/13  8:40 AM      Result Value Range Status   MRSA, PCR NEGATIVE  NEGATIVE Final   Staphylococcus aureus NEGATIVE  NEGATIVE Final   Comment:            The Xpert SA Assay (FDA     approved for NASAL specimens     in patients over 59 years of age),     is one component of     a comprehensive surveillance     program.  Test performance has     been validated by [employer] for patients greater     than or equal to 42 year old.     It is not intended     to diagnose infection nor to     guide or monitor treatment.   Scheduled Meds: . antiseptic oral rinse  15 mL Mouth Rinse BID  . aspirin  81 mg Oral Daily  . atorvastatin  40 mg Oral q1800  . carvedilol  6.25 mg Oral BID WC  . cefTRIAXone (ROCEPHIN)  IV  1 g Intravenous Q24H  . chlordiazePOXIDE  10 mg Oral Daily  . feeding supplement (ENSURE COMPLETE)  237 mL Oral BID BM  . folic acid  1 mg Oral Daily  . furosemide  40 mg Oral Daily  . heparin subcutaneous  5,000 Units Subcutaneous Q8H  . lisinopril  5 mg Oral BID  . pantoprazole  40 mg Oral Daily  . sodium chloride  10-40 mL Intracatheter Q12H  . sodium chloride  3 mL Intravenous Q12H  . sodium chloride  3 mL Intravenous Q12H  . spironolactone  12.5 mg Oral Daily  . thiamine  100 mg Per Tube Daily   Or  .  thiamine  100 mg Intravenous Daily   Continuous Infusions:   Principal Problem:   Septic shock(785.52) Active Problems:   CHF (congestive heart failure)   HTN (hypertension)   Open wound of right foot   Ischemic foot   Smoking hx   Alcohol abuse   Bilateral leg edema   Severe sepsis(995.92)   Severe protein-calorie malnutrition   Septic shock   Acute respiratory failure with hypoxia   Alcohol withdrawal delirium   Acute systolic CHF (congestive heart failure), NYHA class 4  Time spent: 35 minutes.  Susan Bleich M. Cruzita Lederer, MD [facility] [phone]  If 7PM-7AM, please contact night-coverage [email or web address] Password  [email or web address] 04/03/2013, 9:12 AM    LOS: 9 days

## 2013-04-03 NOTE — Progress Notes (Signed)
Patient ID: Layth Cerezo, male   DOB: [birth year], 68 y.o.   MRN: [id number]   SUBJECTIVE: Mr Vanhise seems more oriented today and less drowsy.  He knows that today is New Year's Day.  No dyspnea or chest pain.    Marland Kitchen antiseptic oral rinse  15 mL Mouth Rinse BID  . aspirin  81 mg Oral Daily  . atorvastatin  40 mg Oral q1800  . carvedilol  6.25 mg Oral BID WC  . cefTRIAXone (ROCEPHIN)  IV  1 g Intravenous Q24H  . chlordiazePOXIDE  10 mg Oral Daily  . feeding supplement (ENSURE COMPLETE)  237 mL Oral BID BM  . folic acid  1 mg Oral Daily  . furosemide  40 mg Oral Daily  . heparin subcutaneous  5,000 Units Subcutaneous Q8H  . lisinopril  5 mg Oral BID  . pantoprazole  40 mg Oral Daily  . sodium chloride  10-40 mL Intracatheter Q12H  . sodium chloride  3 mL Intravenous Q12H  . sodium chloride  3 mL Intravenous Q12H  . spironolactone  12.5 mg Oral Daily  . thiamine  100 mg Per Tube Daily   Or  . thiamine  100 mg Intravenous Daily      Filed Vitals:   04/02/13 1500 04/02/13 2109 04/02/13 2127 04/03/13 0411  BP: 113/54 119/65 107/53 111/56  Pulse: 73  78 74  Temp: 98.8 F (37.1 C)  97.5 F (36.4 C) 97.9 F (36.6 C)  TempSrc: Oral  Oral Oral  Resp:   18 18  Height:      Weight:    58.7 kg (129 lb 6.6 oz)  SpO2:   100% 100%    Intake/Output Summary (Last 24 hours) at 04/03/13 0903 Last data filed at 04/03/13 9147  Gross per 24 hour  Intake    270 ml  Output      7 ml  Net    263 ml    LABS: Basic Metabolic Panel:  Recent Labs  04/02/13 0550 04/02/13 1434 04/03/13 0420  NA 141  --  141  K 4.0  --  3.9  CL 103  --  102  CO2 32  --  33*  GLUCOSE 75  --  77  BUN 18  --  16  CREATININE 0.84 0.77 0.75  CALCIUM 8.0*  --  8.0*   Liver Function Tests: No results found for this basename: AST, ALT, ALKPHOS, BILITOT, PROT, ALBUMIN,  in the last 72 hours No results found for this basename: LIPASE, AMYLASE,  in the last 72 hours CBC:  Recent Labs  04/02/13 1434  04/03/13 0420  WBC 8.8 8.0  HGB 11.1* 10.4*  HCT 32.7* 30.6*  MCV 88.1 88.7  PLT 116* 140*    PHYSICAL EXAM General: NAD Neck: JVP not elevated, no thyromegaly or thyroid nodule.  Lungs: Slightly decreased breath sounds at bases. CV: Lateral PMI.  Heart regular S1/S2, no S3/S4, 2/6 HSM at apex, 1/6 early diastolic murmur left sternal border.  Trace left ankle edema. Abdomen: Soft, nontender, no hepatosplenomegaly, no distention.  Neurologic: Alert and oriented to date.  Psych: Normal affect. Extremities: No clubbing or cyanosis. S/p right AKA.   TELEMETRY: Reviewed telemetry pt in NSR  ASSESSMENT AND PLAN: 68 yo with ischemic right lower leg s/p right AKA complicated by post-op E coli bacteremia and septic shock.  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  1. ID: E coli bacteremia post-op right AKA.  He remains on ceftriaxone.  Vitals stable, WBCs considerably decreased compared to 12/28.  2. Neuro: Patient more oriented/alert this morning.  3. Acute systolic CHF: EF 53% on echo with RV dilation and moderate to severe AI and MR.  He has history of heavy ETOH.  LHC yesterday with diffuse 3 vessel disease. Severe RCA and LAD lesions. 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  LVEDP and CVP were normal yesterday when measured in the cath lab. - Increase Coreg to 6.25 mg bid.  - Lasix has been transitioned to po. - Continue current spironolactone and lisinopril.  4. PAD: s/p right AKA, may need revascularization on left.  Continue ASA and statin.  5. CAD: Diffuse 3 vessel disease, most significant lesions being 80-90% proximal LAD stenosis and serial 75% RCA stenoses. As above, think his cardiomyopathy is probably mixed (CAD and ETOH).  I do not think that he would be a good CABG candidate.  Would consider PCI to proximal LAD lesion, +/- RCA.  However, if CMP is mainly ETOH-related, this may not help.  I  am going to get a cardiac MRI tomorrow to assess for viability in the anterior wall and will review films with interventional colleagues. 6. Will need SNF at discharge.     Loralie Champagne 04/03/2013 9:03 AM

## 2013-04-03 NOTE — Progress Notes (Signed)
[facility] Progress Note  04/03/2013 7:52 AM POD 8  Subjective:  Non complaints   Filed Vitals:   04/03/13 0411  BP: 111/56  Pulse: 74  Temp: 97.9 F (36.6 C)  Resp: 18    Physical Exam: Incisions:  C/d/i with staples in tact.   CBC    Component Value Date/Time   WBC 8.0 04/03/2013 0420   RBC 3.45* 04/03/2013 0420   HGB 10.4* 04/03/2013 0420   HCT 30.6* 04/03/2013 0420   PLT 140* 04/03/2013 0420   MCV 88.7 04/03/2013 0420   MCH 30.1 04/03/2013 0420   MCHC 34.0 04/03/2013 0420   RDW 16.8* 04/03/2013 0420   LYMPHSABS 1.0 03/30/2013 0420   MONOABS 0.7 03/30/2013 0420   EOSABS 0.0 03/30/2013 0420   BASOSABS 0.0 03/30/2013 0420    BMET    Component Value Date/Time   NA 141 04/03/2013 0420   K 3.9 04/03/2013 0420   CL 102 04/03/2013 0420   CO2 33* 04/03/2013 0420   GLUCOSE 77 04/03/2013 0420   BUN 16 04/03/2013 0420   CREATININE 0.75 04/03/2013 0420   CALCIUM 8.0* 04/03/2013 0420   GFRNONAA >90 04/03/2013 0420   GFRAA >90 04/03/2013 0420    INR    Component Value Date/Time   INR 1.25 04/02/2013 0550     Intake/Output Summary (Last 24 hours) at 04/03/13 0752 Last data filed at 04/03/13 0037  Gross per 24 hour  Intake    270 ml  Output      7 ml  Net    263 ml     Assessment/Plan:  68 y.o. male is s/p right above knee amputation  POD 8  -doing well -right AKA stump viable --may need intervention to left leg at later date -f/u with Dr. Donnetta Hutching in 4 weeks for staple removal-office will arrange   Leontine Locket, PA-C [facility] [phone] 04/03/2013 7:52 AM

## 2013-04-03 NOTE — Progress Notes (Signed)
Patient alert and oriented x3, disoriented to time.  Patient rating a 1 on the CIWA scale, states he is comfortable at this time.  Morning EKG strip shows depressed T-wave, EKG performed and in chart, MD notified.  Will continue to monitor.

## 2013-04-04 ENCOUNTER — Inpatient Hospital Stay (HOSPITAL_COMMUNITY): Payer: Medicare Other

## 2013-04-04 DIAGNOSIS — I428 Other cardiomyopathies: Secondary | ICD-10-CM

## 2013-04-04 LAB — BASIC METABOLIC PANEL
BUN: 12 mg/dL (ref 6–23)
CO2: 35 meq/L — AB (ref 19–32)
Calcium: 8 mg/dL — ABNORMAL LOW (ref 8.4–10.5)
Chloride: 98 mEq/L (ref 96–112)
Creatinine, Ser: 0.74 mg/dL (ref 0.50–1.35)
GFR calc Af Amer: >90 mL/min (ref >90)
Glucose, Bld: 80 mg/dL (ref 70–99)
POTASSIUM: 4.4 meq/L (ref 3.7–5.3)
SODIUM: 137 meq/L (ref 137–147)

## 2013-04-04 LAB — GLUCOSE, CAPILLARY
GLUCOSE-CAPILLARY: 118 mg/dL — AB (ref 70–99)
GLUCOSE-CAPILLARY: 123 mg/dL — AB (ref 70–99)
GLUCOSE-CAPILLARY: 141 mg/dL — AB (ref 70–99)
GLUCOSE-CAPILLARY: 175 mg/dL — AB (ref 70–99)
GLUCOSE-CAPILLARY: 90 mg/dL (ref 70–99)
Glucose-Capillary: 135 mg/dL — ABNORMAL HIGH (ref 70–99)
Glucose-Capillary: 89 mg/dL (ref 70–99)
Glucose-Capillary: 134 mg/dL — ABNORMAL HIGH (ref 70–99)

## 2013-04-04 LAB — OCCULT BLOOD X 1 CARD TO LAB, STOOL: Fecal Occult Bld: POSITIVE — AB

## 2013-04-04 MED ORDER — SPIRONOLACTONE 25 MG PO TABS
25.0000 mg | ORAL_TABLET | Freq: Every day | ORAL | Status: DC
  Administered 2013-04-05 – 2013-04-06 (×2): 25 mg via ORAL
  Filled 2013-04-04 (×3): qty 1

## 2013-04-04 MED ORDER — GADOBENATE DIMEGLUMINE 529 MG/ML IV SOLN
25.0000 mL | Freq: Once | INTRAVENOUS | Status: AC
  Administered 2013-04-04: 10:00:00 25 mL via INTRAVENOUS

## 2013-04-04 NOTE — Progress Notes (Addendum)
Patient ID: Rodney [name], male   DOB: 12-14-1945, 68 y.o.   MRN: [id number]   SUBJECTIVE: Rodney [name] is awake, oriented to place.  No dyspnea or chest pain.    Marland Kitchen antiseptic oral rinse  15 mL Mouth Rinse BID  . aspirin  81 mg Oral Daily  . atorvastatin  40 mg Oral q1800  . carvedilol  6.25 mg Oral BID WC  . chlordiazePOXIDE  10 mg Oral Daily  . feeding supplement (ENSURE COMPLETE)  237 mL Oral BID BM  . folic acid  1 mg Oral Daily  . furosemide  40 mg Oral Daily  . heparin subcutaneous  5,000 Units Subcutaneous Q8H  . lisinopril  5 mg Oral BID  . pantoprazole  40 mg Oral Daily  . sodium chloride  10-40 mL Intracatheter Q12H  . sodium chloride  3 mL Intravenous Q12H  . sodium chloride  3 mL Intravenous Q12H  . [START ON 04/05/2013] spironolactone  25 mg Oral Daily  . thiamine  100 mg Per Tube Daily      Filed Vitals:   04/03/13 1546 04/03/13 2018 04/04/13 0520 04/04/13 1103  BP: 115/53 100/47 116/56 109/54  Pulse: 81 83 79 75  Temp:  99.4 F (37.4 C) 99.2 F (37.3 C)   TempSrc:  Oral Oral   Resp:  18 18   Height:      Weight:   55 kg (121 lb 4.1 oz)   SpO2:  100% 100% 100%    Intake/Output Summary (Last 24 hours) at 04/04/13 1311 Last data filed at 04/04/13 1100  Gross per 24 hour  Intake   1310 ml  Output    225 ml  Net   1085 ml    LABS: Basic Metabolic Panel:  Recent Labs  04/03/13 0420 04/04/13 0520  NA 141 137  K 3.9 4.4  CL 102 98  CO2 33* 35*  GLUCOSE 77 80  BUN 16 12  CREATININE 0.75 0.74  CALCIUM 8.0* 8.0*   Liver Function Tests: No results found for this basename: AST, ALT, ALKPHOS, BILITOT, PROT, ALBUMIN,  in the last 72 hours No results found for this basename: LIPASE, AMYLASE,  in the last 72 hours CBC:  Recent Labs  04/02/13 1434 04/03/13 0420  WBC 8.8 8.0  HGB 11.1* 10.4*  HCT 32.7* 30.6*  MCV 88.1 88.7  PLT 116* 140*    PHYSICAL EXAM General: NAD Neck: JVP not elevated, no thyromegaly or thyroid nodule.  Lungs: Slightly  decreased breath sounds at bases. CV: Lateral PMI.  Heart regular S1/S2, no S3/S4, 2/6 HSM at apex, 1/6 early diastolic murmur left sternal border.  Trace left ankle edema. Abdomen: Soft, nontender, no hepatosplenomegaly, no distention.  Neurologic: Alert and oriented to date.  Psych: Normal affect. Extremities: No clubbing or cyanosis. S/p right AKA.   TELEMETRY: Reviewed telemetry pt in NSR  ASSESSMENT AND PLAN: 68 yo with ischemic right lower leg s/p right AKA complicated by post-op E coli bacteremia and septic shock.  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  1. ID: E coli bacteremia post-op right AKA.  He has completed his ceftriaxone course.  Vitals stable, WBCs considerably decreased compared to 12/28.  2. Neuro: Patient alert, oriented to place.  3. Acute systolic CHF: EF 03% on echo with RV dilation and moderate to severe AI and Rodney.  He has history of heavy ETOH.  [place] 12/31 with diffuse 3 vessel disease. Severe RCA and LAD lesions. Given EF 15%  with diffuse HK on echo, I think that cardiomyopathy is probably mixed and related to both CAD and ETOH.  LVEDP and CVP were when measured in the cath lab.  He had a cardiac MRI done today.  This showed EF 23% with subendocardial scar in the lateral wall (probably still viable).  This likely corresponds to the occluded OM.  The LAD and RCA territories appear viable.  - Continue current Lasix po, Coreg, lisinopril.  - Increase spironolactone to 25 mg daily.  4. PAD: s/p right AKA, may need revascularization on left.  Continue ASA and statin.  5. CAD: Diffuse 3 vessel disease, most significant lesions being 80-90% proximal LAD stenosis and serial 75% RCA stenoses. As above, think his cardiomyopathy is probably mixed (CAD and ETOH).  I do not think that he would be a good CABG candidate.  Would consider PCI to proximal LAD lesion, +/- RCA.  Anterior and inferior walls appear viable on MRI.  However, I think ETOH plays a significant role in  his cardiomyopathy so revascularization may not help much. Will review films with interventional colleagues, could potentially plan for PCI on Monday.  6. Will need SNF at discharge.     Loralie Champagne 04/04/2013 1:11 PM

## 2013-04-04 NOTE — Progress Notes (Addendum)
Active bed search in place for SNF bed. Patient currently agrees to SNF placement. Patient's insurance is showing as Medicaid only.  Not sure why he does not have Medicare and attempting to discover why this is so.  CSW will follow.  Lorie Phenix. [place], [place]

## 2013-04-04 NOTE — Clinical Social Work Placement (Addendum)
    Clinical Social Work Department CLINICAL SOCIAL WORK PLACEMENT NOTE 04/13/2013  Patient:  KIMSEY, DEMAREE  Account Number:  [id number] Admit date:  03/25/2013  Clinical Social Worker:  Butch Penny Carliss Quast, LCSWA  Date/time:  04/04/2013 04:26 PM  Clinical Social Work is seeking post-discharge placement for this patient at the following level of care:   SKILLED NURSING   (*CSW will update this form in Epic as items are completed)   04/04/2013  Patient/family provided with [place] Department of Clinical Social Work's list of facilities offering this level of care within the geographic area requested by the patient (or if unable, by the patient's family).  04/04/2013  Patient/family informed of their freedom to choose among providers that offer the needed level of care, that participate in Medicare, Medicaid or managed care program needed by the patient, have an available bed and are willing to accept the patient.  04/04/2013  Patient/family informed of [facility]' ownership interest in [facility], as well as of the fact that they are under no obligation to receive care at this facility.  PASARR submitted to E[employer] on  PASARR number received from [place] on   FL2 transmitted to all facilities in geographic area requested by pt/family on  04/04/2013 FL2 transmitted to all facilities within larger geographic area on   Patient informed that his/her managed care company has contracts with or will negotiate with  certain facilities, including the following:   Has existing PASRAR number  Patient is Medicaid ONLY     Patient/family informed of bed offers received:  04/07/2013 Patient chooses bed at [facility] Physician recommends and patient chooses bed at    Patient to be transferred to [facility] on  04/07/2013 Patient to be transferred to facility by Ambulance  Corey Harold)  The following physician request were entered in Epic:   Additional  Comments: 04/07/13  Patient had multiple bed offers and chose [facility] CSW offered to notify patient's family but he declined- stating that he would notify them once he go to the faclity.  N[facility]ng is aware and called report. No further CSW needs identified. CSW signing off.  Lorie Phenix. [place], [place]

## 2013-04-04 NOTE — Progress Notes (Signed)
PROGRESS NOTE  Enrigue Hashimi BDZ:[id number] DOB: 1945-04-15 DOA: 03/25/2013 PCP: No PCP Per Patient  Assessment/Plan: Acute systolic CHF/EF 34%/HDQQIWL cardiomyopathy - Etiology of cardiomyopathy is uncertain. 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  - patient underwent cardiac catheterization 12/31 with RCA and LAD lesions.  - per cardiology, plan for potential PCI on Monday. MRI done today.  Septic shock/Escherichia coli & Morganella morganii bacteremia - Secondary to Escherichia coli & Morganella morganii bacteremia-? Source. Antibiotics narrowed to ceftriaxone alone and plan for 10 days antibiotics until 04/03/13. No UA or urine culture sent on admission. - today day 10/10, discontinue antibiotics.  Acute respiratory failure - Secondary to decompensated CHF, RLL CAP and septic shock, extubated 12/27. - Stable. 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 He may need intervention to left leg at later date but has no complaints of left foot pain at this time. Patient needs to followup with Dr. Sherren Mocha Early in 3 weeks for staple removal. Hypertension- Controlled History of alcohol abuse- No features of alcohol withdrawal at this time. Currently on Librium, thiamine and multivitamins.  Tobacco abuse - Cessation counseled Hypokalemia - Replaced Anemia - Hemoglobin stable Thrombocytopenia - Likely secondary to sepsis. Platelets gradually increasing ?GI bleed - FOBT pending. IV Protonix. CBC stable.   Code Status: Full Family Communication: None at bedside Disposition Plan: SNF once cardiac workup completed  Consultants:  Vascular surgery  Cardiology  Critical care medicine  Procedures:  Right AKA  R [place] CVL 12/24   ETT 12/24 >> 12/27   R rad art line 12/24>>12/25  Antibiotics: 12/23 doxy>>12/24  12/23 vanco12/24  12/23 zosyn -DC'd 12/24 cipro>>12/26 IV Rocephin  Subjective: - has no  complaints today, feeling well  Objective: Filed Vitals:   04/03/13 2018 04/04/13 0520 04/04/13 1103 04/04/13 1500  BP: 100/47 116/56 109/54 110/59  Pulse: 83 79 75 77  Temp: 99.4 F (37.4 C) 99.2 F (37.3 C)  98 F (36.7 C)  TempSrc: Oral Oral  Oral  Resp: 18 18  18   Height:      Weight:  55 kg (121 lb 4.1 oz)    SpO2: 100% 100% 100% 100%    Intake/Output Summary (Last 24 hours) at 04/04/13 1711 Last data filed at 04/04/13 1527  Gross per 24 hour  Intake   1300 ml  Output     51 ml  Net   1249 ml   Filed Weights   04/02/13 0641 04/03/13 0411 04/04/13 0520  Weight: 59.1 kg (130 lb 4.7 oz) 58.7 kg (129 lb 6.6 oz) 55 kg (121 lb 4.1 oz)   Exam: General exam: Moderately built and poorly nourished, older than stated age, male lying comfortably in bed. Respiratory system: Clear. No increased work of breathing. Cardiovascular system: S1 & S2 heard, RRR. No JVD, murmurs, gallops, clicks. trace left ankle edema.  Gastrointestinal system: Abdomen is nondistended, soft and nontender. Normal bowel sounds heard. Central nervous system: Alert and oriented. No focal neurological deficits. Extremities: Symmetric 5 x 5 power. Right AKA: Stump clean and staples intact.  Data Reviewed: Basic Metabolic Panel:  Recent Labs Lab 03/29/13 0425  03/31/13 0430 04/01/13 0520 04/02/13 0550 04/02/13 1434 04/03/13 0420 04/04/13 0520  NA 134*  < > 137 143 141  --  141 137  K 3.3*  < > 3.3* 4.1 4.0  --  3.9 4.4  CL 102  < > 105 105 103  --  102 98  CO2 22  < > 26 28 32  --  33* 35*  GLUCOSE 124*  < > 83 70 75  --  77 80  BUN 29*  < > 30* 25* 18  --  16 12  CREATININE 0.91  < > 1.07 1.00 0.84 0.77 0.75 0.74  CALCIUM 7.2*  < > 7.5* 8.0* 8.0*  --  8.0* 8.0*  MG 2.3  --  1.9  --   --   --   --   --   PHOS 2.2*  --   --   --   --   --   --   --   < > = values in this interval not displayed. Liver Function Tests:  Recent Labs Lab 03/30/13 0420  AST 32  ALT 27  ALKPHOS 118*  BILITOT 1.2    PROT 5.2*  ALBUMIN 1.8*   CBC:  Recent Labs Lab 03/29/13 0425 03/30/13 0420  04/01/13 1837 04/02/13 0009 04/02/13 0550 04/02/13 1434 04/03/13 0420  WBC 17.3* 17.8*  < > 7.3 6.9 6.9 8.8 8.0  NEUTROABS 16.2* 16.1*  --   --   --   --   --   --   HGB 10.1* 9.9*  < > 11.1* 11.1* 10.5* 11.1* 10.4*  HCT 29.0* 28.4*  < > 32.7* 31.7* 31.5* 32.7* 30.6*  MCV 84.8 85.3  < > 88.1 87.3 88.0 88.1 88.7  PLT 52* 55*  < > 124* 120* 116* 116* 140*  < > = values in this interval not displayed. BNP (last 3 results)  Recent Labs  03/25/13 1240  PROBNP 14339.0*   CBG:  Recent Labs Lab 04/04/13 0033 04/04/13 0454 04/04/13 1002 04/04/13 1355 04/04/13 1543  GLUCAP 90 89 175* 141* 118*   Recent Results (from the past 240 hour(s))  CULTURE, BLOOD (ROUTINE X 2)     Status: None   Collection Time    03/25/13  5:34 PM      Result Value Range Status   Specimen Description BLOOD RIGHT FOREARM   Final   Special Requests BOTTLES DRAWN AEROBIC ONLY 3CC   Final   Culture  Setup Time     Final   Value: 03/26/2013 14:11     Performed at [facility]   Culture     Final   Value: ESCHERICHIA COLI     Note: Gram Stain Report Called to,Read Back By and Verified With: Vira Blanco [id number] 03/26/13 BY Elza Rafter     Performed at [facility]   Report Status 03/28/2013 FINAL   Final   Organism ID, Bacteria ESCHERICHIA COLI   Final  CULTURE, BLOOD (ROUTINE X 2)     Status: None   Collection Time    03/25/13  9:59 PM      Result Value Range Status   Specimen Description BLOOD RIGHT HAND   Final   Special Requests BOTTLES DRAWN AEROBIC AND ANAEROBIC [facility] EACH   Final   Culture  Setup Time     Final   Value: 03/27/2013 23:41     Performed at [facility]   Culture     Final   Value: ESCHERICHIA COLI     MORGANELLA MORGANII     Note: Gram Stain Report Called to,Read Back By and Verified With:  YWV M @0725  ON 03/28/13 BY SMIAS     Performed at [facility]   Report  Status 03/31/2013 FINAL   Final   Organism  ID, Bacteria ESCHERICHIA COLI   Final   Organism ID, Bacteria MORGANELLA MORGANII   Final  MRSA PCR SCREENING     Status: None   Collection Time    03/25/13 11:12 PM      Result Value Range Status   MRSA by PCR NEGATIVE  NEGATIVE Final   Comment:            The GeneXpert MRSA Assay (FDA     approved for NASAL specimens     only), is one component of a     comprehensive MRSA colonization     surveillance program. It is not     intended to diagnose MRSA     infection nor to guide or     monitor treatment for     MRSA infections.  SURGICAL PCR SCREEN     Status: None   Collection Time    04/02/13  8:40 AM      Result Value Range Status   MRSA, PCR NEGATIVE  NEGATIVE Final   Staphylococcus aureus NEGATIVE  NEGATIVE Final   Comment:            The Xpert SA Assay (FDA     approved for NASAL specimens     in patients over 9 years of age),     is one component of     a comprehensive surveillance     program.  Test performance has     been validated by [employer] for patients greater     than or equal to 34 year old.     It is not intended     to diagnose infection nor to     guide or monitor treatment.   Scheduled Meds: . antiseptic oral rinse  15 mL Mouth Rinse BID  . aspirin  81 mg Oral Daily  . atorvastatin  40 mg Oral q1800  . carvedilol  6.25 mg Oral BID WC  . chlordiazePOXIDE  10 mg Oral Daily  . feeding supplement (ENSURE COMPLETE)  237 mL Oral BID BM  . folic acid  1 mg Oral Daily  . furosemide  40 mg Oral Daily  . heparin subcutaneous  5,000 Units Subcutaneous Q8H  . lisinopril  5 mg Oral BID  . pantoprazole  40 mg Oral Daily  . sodium chloride  10-40 mL Intracatheter Q12H  . sodium chloride  3 mL Intravenous Q12H  . sodium chloride  3 mL Intravenous Q12H  . [START ON 04/05/2013] spironolactone  25 mg Oral Daily  . thiamine  100 mg Per Tube Daily   Continuous Infusions:   Principal Problem:   Septic  shock(785.52) Active Problems:   CHF (congestive heart failure)   HTN (hypertension)   Open wound of right foot   Ischemic foot   Smoking hx   Alcohol abuse   Bilateral leg edema   Severe sepsis(995.92)   Severe protein-calorie malnutrition   Septic shock   Acute respiratory failure with hypoxia   Alcohol withdrawal delirium   Acute systolic CHF (congestive heart failure), NYHA class 4  Time spent: 25 minutes.  Costin M. Cruzita Lederer, MD [facility] [phone]  If 7PM-7AM, please contact night-coverage [email or web address] Password [email or web address] 04/04/2013, 5:11 PM    LOS: 10 days

## 2013-04-04 NOTE — Progress Notes (Signed)
Patient alert and oriented x3, disoriented to time.  Patient rating 1 on CIWA scale, unable to do serial addition, but this may be the patient's current baseline.  Occult stool sent to lab, results pending.  Patient asked what the purpose of his MRI was this morning, explained to him.  Patient is agreeable to current plan of care and states he has no questions or concerns at this time.  Will continue to monitor.

## 2013-04-05 LAB — CBC
HCT: 29.3 % — ABNORMAL LOW (ref 39.0–52.0)
HEMOGLOBIN: 9.8 g/dL — AB (ref 13.0–17.0)
MCH: 30 pg (ref 26.0–34.0)
MCHC: 33.4 g/dL (ref 30.0–36.0)
MCV: 89.6 fL (ref 78.0–100.0)
PLATELETS: 192 10*3/uL (ref 150–400)
RBC: 3.27 MIL/uL — ABNORMAL LOW (ref 4.22–5.81)
RDW: 17.1 % — AB (ref 11.5–15.5)
WBC: 7 10*3/uL (ref 4.0–10.5)

## 2013-04-05 LAB — BASIC METABOLIC PANEL
BUN: 10 mg/dL (ref 6–23)
CALCIUM: 8.4 mg/dL (ref 8.4–10.5)
CO2: 36 mEq/L — ABNORMAL HIGH (ref 19–32)
Chloride: 98 mEq/L (ref 96–112)
Creatinine, Ser: 0.72 mg/dL (ref 0.50–1.35)
GLUCOSE: 105 mg/dL — AB (ref 70–99)
POTASSIUM: 4.3 meq/L (ref 3.7–5.3)
Sodium: 139 mEq/L (ref 137–147)

## 2013-04-05 LAB — GLUCOSE, CAPILLARY
GLUCOSE-CAPILLARY: 99 mg/dL (ref 70–99)
Glucose-Capillary: 102 mg/dL — ABNORMAL HIGH (ref 70–99)
Glucose-Capillary: 105 mg/dL — ABNORMAL HIGH (ref 70–99)
Glucose-Capillary: 85 mg/dL (ref 70–99)
Glucose-Capillary: 91 mg/dL (ref 70–99)
Glucose-Capillary: 97 mg/dL (ref 70–99)
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
## 2013-04-05 NOTE — Progress Notes (Signed)
The patient is A&Ox3 and is confused at times.  His CIWA scale is unchanged from previous assessments.  His right radial cardiac cath site is still a level 0 and the patient continues to have maroon colored stools.

## 2013-04-05 NOTE — Progress Notes (Signed)
Patient ID: Rodney [name], male   DOB: 11-20-1945, 68 y.o.   MRN: [id number]   SUBJECTIVE: Some confusion at times per nursing.  Oriented to place this morning.  Still with maroon, FOBT stool though hemoglobin is relatively stable.  No dyspnea or chest pain.    Marland Kitchen antiseptic oral rinse  15 mL Mouth Rinse BID  . aspirin  81 mg Oral Daily  . atorvastatin  40 mg Oral q1800  . carvedilol  6.25 mg Oral BID WC  . chlordiazePOXIDE  10 mg Oral Daily  . feeding supplement (ENSURE COMPLETE)  237 mL Oral BID BM  . folic acid  1 mg Oral Daily  . furosemide  40 mg Oral Daily  . heparin subcutaneous  5,000 Units Subcutaneous Q8H  . lisinopril  5 mg Oral BID  . pantoprazole  40 mg Oral Daily  . sodium chloride  10-40 mL Intracatheter Q12H  . sodium chloride  3 mL Intravenous Q12H  . sodium chloride  3 mL Intravenous Q12H  . spironolactone  25 mg Oral Daily  . thiamine  100 mg Per Tube Daily      Filed Vitals:   04/04/13 2010 04/04/13 2049 04/05/13 0543 04/05/13 0922  BP: 106/50 101/49 99/48 108/50  Pulse:  74 76 75  Temp:  99.5 F (37.5 C) 98.6 F (37 C) 98.5 F (36.9 C)  TempSrc:  Oral Oral Oral  Resp:  20 20 20   Height:      Weight:   52.7 kg (116 lb 2.9 oz)   SpO2:  100% 100% 100%    Intake/Output Summary (Last 24 hours) at 04/05/13 1112 Last data filed at 04/05/13 0814  Gross per 24 hour  Intake    960 ml  Output   1001 ml  Net    -41 ml    LABS: Basic Metabolic Panel:  Recent Labs  04/04/13 0520 04/05/13 0500  NA 137 139  K 4.4 4.3  CL 98 98  CO2 35* 36*  GLUCOSE 80 105*  BUN 12 10  CREATININE 0.74 0.72  CALCIUM 8.0* 8.4   Liver Function Tests: No results found for this basename: AST, ALT, ALKPHOS, BILITOT, PROT, ALBUMIN,  in the last 72 hours No results found for this basename: LIPASE, AMYLASE,  in the last 72 hours CBC:  Recent Labs  04/03/13 0420 04/05/13 0500  WBC 8.0 7.0  HGB 10.4* 9.8*  HCT 30.6* 29.3*  MCV 88.7 89.6  PLT 140* 192    PHYSICAL  EXAM General: NAD Neck: JVP not elevated, no thyromegaly or thyroid nodule.  Lungs: Slightly decreased breath sounds at bases. CV: Lateral PMI.  Heart regular S1/S2, no S3/S4, 2/6 HSM at apex, 1/6 early diastolic murmur left sternal border.  Trace left ankle edema. Abdomen: Soft, nontender, no hepatosplenomegaly, no distention.  Neurologic: Alert and oriented to date.  Psych: Normal affect. Extremities: No clubbing or cyanosis. S/p right AKA.   TELEMETRY: Reviewed telemetry pt in NSR  ASSESSMENT AND PLAN: 68 yo with ischemic right lower leg s/p right AKA complicated by post-op E coli bacteremia and septic shock.  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  1. ID: E coli bacteremia post-op right AKA.  He has completed his ceftriaxone course.  Vitals stable, WBCs considerably decreased compared to 12/28.  2. Neuro: Patient alert, oriented to place. Still on and off confusion.  3. Acute systolic CHF: EF 90% on echo with RV dilation and moderate to severe AI and MR.  He has history of heavy ETOH.  [place] 12/31 with diffuse 3 vessel disease. Severe RCA and LAD lesions. 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  LVEDP and CVP were normal when measured in the cath lab.  He had a cardiac MRI done yesterday.  This showed EF 23% with subendocardial scar in the lateral wall (probably still viable).  This likely corresponds to the occluded OM.  The LAD and RCA territories appear viable.  - Continue current Lasix po, Coreg, lisinopril, spironolactone.  No BP room for titration. 4. PAD: s/p right AKA, may need revascularization on left.  Continue ASA and statin.  5. CAD: Diffuse 3 vessel disease, most significant lesions being 80-90% proximal LAD stenosis and serial 75% RCA stenoses. As above, think his cardiomyopathy is probably mixed (CAD and ETOH).  I do not think that he would be a good CABG candidate.  Could consider PCI to  proximal LAD lesion, +/- RCA.  Anterior and inferior walls appear viable on MRI.  However, I think ETOH plays a significant role in his cardiomyopathy so revascularization may not help much. Reviewed films with interventional cardiology and plan medical management for now.  6. Will need SNF at discharge.  This should help with medication compliance.   Loralie Champagne 04/05/2013 11:12 AM

## 2013-04-05 NOTE — Progress Notes (Signed)
PROGRESS NOTE  [facility] [name] [id number] DOB: 03/24/1946 DOA: 03/25/2013 PCP: No PCP Per Patient  Assessment/Plan: Acute systolic CHF/EF [id number] cardiomyopathy - Etiology of cardiomyopathy is uncertain. 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  - patient underwent cardiac catheterization 12/31 with RCA and LAD lesions.  - once cards complete workup, plan for SNF discharge Septic shock/Escherichia coli & Morganella morganii bacteremia - Secondary to Escherichia coli & Morganella morganii bacteremia-? Source. Antibiotics narrowed to ceftriaxone alone and plan for 10 days antibiotics until 04/03/13. No UA or urine culture sent on admission..  Acute respiratory failure - Secondary to decompensated CHF, RLL CAP and septic shock, extubated 12/27. - Stable. 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 He may need intervention to left leg at later date but has no complaints of left foot pain at this time. Patient needs to followup with Dr. Sherren Mocha Early in 3 weeks for staple removal. Hypertension- Controlled History of alcohol abuse- No features of alcohol withdrawal at this time. Currently on Librium, thiamine and multivitamins.  Tobacco abuse - Cessation counseled Hypokalemia - Replaced Anemia - Hemoglobin stable Thrombocytopenia - Likely secondary to sepsis in the setting of chronic ETOH. Platelets gradually increasing GI bleed - IV Protonix. CBC stable.   Code Status: Full Family Communication: None at bedside Disposition Plan: SNF once cardiac workup completed  Consultants:  Vascular surgery  Cardiology  Critical care medicine  Procedures:  Right AKA  R [place] CVL 12/24   ETT 12/24 >> 12/27   R rad art line 12/24>>12/25  Antibiotics: 12/23 doxy>>12/24  12/23 vanco12/24  12/23 zosyn -DC'd 12/24 cipro>>12/26 IV Rocephin  Subjective: - has no complaints today, feeling well. A bit more  confused.  Objective: Filed Vitals:   04/04/13 1500 04/04/13 2010 04/04/13 2049 04/05/13 0543  BP: 110/59 106/50 101/49 99/48  Pulse: 77  74 76  Temp: 98 F (36.7 C)  99.5 F (37.5 C) 98.6 F (37 C)  TempSrc: Oral  Oral Oral  Resp: 18  20 20   Height:      Weight:    52.7 kg (116 lb 2.9 oz)  SpO2: 100%  100% 100%    Intake/Output Summary (Last 24 hours) at 04/05/13 0845 Last data filed at 04/05/13 [id number]  Gross per 24 hour  Intake    840 ml  Output   1002 ml  Net   -162 ml   Filed Weights   04/03/13 0411 04/04/13 0520 04/05/13 0543  Weight: 58.7 kg (129 lb 6.6 oz) 55 kg (121 lb 4.1 oz) 52.7 kg (116 lb 2.9 oz)   Exam: General exam: Moderately built and poorly nourished, older than stated age, male lying comfortably in bed. Respiratory system: Clear. No increased work of breathing. Cardiovascular system: S1 & S2 heard, RRR. No JVD, murmurs, gallops, clicks. trace left ankle edema.  Gastrointestinal system: Abdomen is nondistended, soft and nontender. Normal bowel sounds heard. Central nervous system: Alert and oriented. No focal neurological deficits. Extremities: Symmetric 5 x 5 power. Right AKA: Stump clean and staples intact.  Data Reviewed: Basic Metabolic Panel:  Recent Labs Lab 03/31/13 0430 04/01/13 0520 04/02/13 0550 04/02/13 1434 04/03/13 0420 04/04/13 0520 04/05/13 0500  NA 137 143 141  --  141 137 139  K 3.3* 4.1 4.0  --  3.9 4.4 4.3  CL 105 105 103  --  102 98 98  CO2 26 28 32  --  33* 35* 36*  GLUCOSE  83 70 75  --  77 80 105*  BUN 30* 25* 18  --  16 12 10   CREATININE 1.07 1.00 0.84 0.77 0.75 0.74 0.72  CALCIUM 7.5* 8.0* 8.0*  --  8.0* 8.0* 8.4  MG 1.9  --   --   --   --   --   --    Liver Function Tests:  Recent Labs Lab 03/30/13 0420  AST 32  ALT 27  ALKPHOS 118*  BILITOT 1.2  PROT 5.2*  ALBUMIN 1.8*   CBC:  Recent Labs Lab 03/30/13 0420  04/02/13 0009 04/02/13 0550 04/02/13 1434 04/03/13 0420 04/05/13 0500  WBC 17.8*  < > 6.9  6.9 8.8 8.0 7.0  NEUTROABS 16.1*  --   --   --   --   --   --   HGB 9.9*  < > 11.1* 10.5* 11.1* 10.4* 9.8*  HCT 28.4*  < > 31.7* 31.5* 32.7* 30.6* 29.3*  MCV 85.3  < > 87.3 88.0 88.1 88.7 89.6  PLT 55*  < > 120* 116* 116* 140* 192  < > = values in this interval not displayed. BNP (last 3 results)  Recent Labs  03/25/13 1240  PROBNP 14339.0*   CBG:  Recent Labs Lab 04/04/13 1355 04/04/13 1543 04/04/13 2027 04/04/13 2351 04/05/13 0412  GLUCAP 141* 118* 134* 123* 102*   Recent Results (from the past 240 hour(s))  SURGICAL PCR SCREEN     Status: None   Collection Time    04/02/13  8:40 AM      Result Value Range Status   MRSA, PCR NEGATIVE  NEGATIVE Final   Staphylococcus aureus NEGATIVE  NEGATIVE Final   Comment:            The Xpert SA Assay (FDA     approved for NASAL specimens     in patients over 50 years of age),     is one component of     a comprehensive surveillance     program.  Test performance has     been validated by [employer] for patients greater     than or equal to 4 year old.     It is not intended     to diagnose infection nor to     guide or monitor treatment.   Scheduled Meds: . antiseptic oral rinse  15 mL Mouth Rinse BID  . aspirin  81 mg Oral Daily  . atorvastatin  40 mg Oral q1800  . carvedilol  6.25 mg Oral BID WC  . chlordiazePOXIDE  10 mg Oral Daily  . feeding supplement (ENSURE COMPLETE)  237 mL Oral BID BM  . folic acid  1 mg Oral Daily  . furosemide  40 mg Oral Daily  . heparin subcutaneous  5,000 Units Subcutaneous Q8H  . lisinopril  5 mg Oral BID  . pantoprazole  40 mg Oral Daily  . sodium chloride  10-40 mL Intracatheter Q12H  . sodium chloride  3 mL Intravenous Q12H  . sodium chloride  3 mL Intravenous Q12H  . spironolactone  25 mg Oral Daily  . thiamine  100 mg Per Tube Daily   Continuous Infusions:   Principal Problem:   Septic shock(785.52) Active Problems:   CHF (congestive heart failure)   HTN  (hypertension)   Open wound of right foot   Ischemic foot   Smoking hx   Alcohol abuse   Bilateral leg edema   Severe  sepsis(995.92)   Severe protein-calorie malnutrition   Septic shock   Acute respiratory failure with hypoxia   Alcohol withdrawal delirium   Acute systolic CHF (congestive heart failure), NYHA class 4  Time spent: 25 minutes.  Isac Lincks M. Cruzita Lederer, MD [facility] [phone]  If 7PM-7AM, please contact night-coverage [email or web address] Password [email or web address] 04/05/2013, 8:45 AM    LOS: 11 days

## 2013-04-06 DIAGNOSIS — D649 Anemia, unspecified: Secondary | ICD-10-CM

## 2013-04-06 DIAGNOSIS — R710 Precipitous drop in hematocrit: Secondary | ICD-10-CM

## 2013-04-06 DIAGNOSIS — R195 Other fecal abnormalities: Secondary | ICD-10-CM | POA: Clinically undetermined

## 2013-04-06 LAB — BASIC METABOLIC PANEL
BUN: 11 mg/dL (ref 6–23)
CO2: 36 meq/L — AB (ref 19–32)
CREATININE: 0.81 mg/dL (ref 0.50–1.35)
Calcium: 8.3 mg/dL — ABNORMAL LOW (ref 8.4–10.5)
Chloride: 97 mEq/L (ref 96–112)
GFR calc Af Amer: >90 mL/min (ref >90)
GFR calc non Af Amer: 89 mL/min — ABNORMAL LOW (ref >90)
GLUCOSE: 80 mg/dL (ref 70–99)
Potassium: 4.5 mEq/L (ref 3.7–5.3)
Sodium: 138 mEq/L (ref 137–147)

## 2013-04-06 LAB — GLUCOSE, CAPILLARY
GLUCOSE-CAPILLARY: 100 mg/dL — AB (ref 70–99)
GLUCOSE-CAPILLARY: 82 mg/dL (ref 70–99)
GLUCOSE-CAPILLARY: 177 mg/dL — AB (ref 70–99)
Glucose-Capillary: 88 mg/dL (ref 70–99)

## 2013-04-06 LAB — CBC
HEMATOCRIT: 28.5 % — AB (ref 39.0–52.0)
HEMOGLOBIN: 9.6 g/dL — AB (ref 13.0–17.0)
MCH: 30.1 pg (ref 26.0–34.0)
MCHC: 33.7 g/dL (ref 30.0–36.0)
MCV: 89.3 fL (ref 78.0–100.0)
Platelets: 208 10*3/uL (ref 150–400)
RBC: 3.19 MIL/uL — ABNORMAL LOW (ref 4.22–5.81)
RDW: 16.9 % — AB (ref 11.5–15.5)
WBC: 7 10*3/uL (ref 4.0–10.5)

## 2013-04-06 NOTE — Progress Notes (Signed)
Patient ID: Rodney [name], male   DOB: 23-Apr-1945, 68 y.o.   MRN: [id number] No changes from cardiac standpoint (see yesterday's note for plan).  No room for med titration.  Continue current regimen, no plan for coronary intervention.  We will see him in followup after discharge.    Loralie Champagne 04/06/2013

## 2013-04-06 NOTE — Progress Notes (Signed)
PROGRESS NOTE  Rodney [name] [id number] DOB: [birth year] DOA: 03/25/2013 PCP: No PCP Per Patient   Assessment/Plan: GI bleed - continue Protonix. CBC relatively stable but overall trending down - will consult GI today given nursing reports that he continues to have stools concerning for ongoing bleed; he needs to be on aspirin. Acute systolic CHF/EF 0000000 dilated cardiomyopathy - Etiology of cardiomyopathy is uncertain. 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  - patient underwent cardiac catheterization 12/31 with RCA and LAD lesions.  - once cards complete workup, plan for SNF discharge Septic shock/Escherichia coli & Morganella morganii bacteremia - Secondary to Escherichia coli & Morganella morganii bacteremia-? Source. Antibiotics narrowed to ceftriaxone alone and plan for 10 days antibiotics until 04/03/13. No UA or urine culture sent on admission..  Acute respiratory failure - Secondary to decompensated CHF, RLL CAP and septic shock, extubated 12/27. - Stable. 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 He may need intervention to left leg at later date but has no complaints of left foot pain at this time. Patient needs to followup with Dr. Sherren Mocha Early in 3 weeks for staple removal. Hypertension- Controlled History of alcohol abuse- No features of alcohol withdrawal at this time. Currently on Librium, thiamine and multivitamins.  Tobacco abuse - Cessation counseled Hypokalemia - Replaced Anemia - Hemoglobin stable Thrombocytopenia - Likely secondary to sepsis in the setting of chronic ETOH. Platelets gradually increasing   Code Status: Full Family Communication: None at bedside Disposition Plan: SNF once cardiac/GI workup completed  Consultants:  Vascular surgery  Cardiology  Critical care medicine  Gastroenterology  Procedures:  Right AKA  R [place] CVL 12/24   ETT 12/24 >> 12/27   R  rad art line 12/24>>12/25  Antibiotics: 12/23 doxy>>12/24  12/23 vanco12/24  12/23 zosyn -DC'd 12/24 cipro>>12/26 IV Rocephin  Subjective: - has no complaints today, feeling well, confusion better.   Objective: Filed Vitals:   04/05/13 1400 04/05/13 1825 04/05/13 2210 04/06/13 0408  BP: 103/47 101/50 106/53 109/58  Pulse: 72 75 74 76  Temp: 97.3 F (36.3 C)  97.6 F (36.4 C) 97.9 F (36.6 C)  TempSrc: Oral  Oral Oral  Resp: 20  20 20   Height:      Weight:    53.2 kg (117 lb 4.6 oz)  SpO2: 100%  98% 97%    Intake/Output Summary (Last 24 hours) at 04/06/13 0848 Last data filed at 04/06/13 [id number]  Gross per 24 hour  Intake    630 ml  Output   1002 ml  Net   -372 ml   Filed Weights   04/04/13 0520 04/05/13 0543 04/06/13 0408  Weight: 55 kg (121 lb 4.1 oz) 52.7 kg (116 lb 2.9 oz) 53.2 kg (117 lb 4.6 oz)   Exam: General exam: Moderately built and poorly nourished, older than stated age (68), male lying comfortably in bed. Respiratory system: Clear. No increased work of breathing. Cardiovascular system: S1 & S2 heard, RRR. No JVD, murmurs, gallops, clicks. trace left ankle edema.  Gastrointestinal system: Abdomen is nondistended, soft and nontender. Normal bowel sounds heard. Central nervous system: Alert and oriented. No focal neurological deficits. Extremities: Symmetric 5 x 5 power. Right AKA: Stump clean and staples intact.  Data Reviewed: Basic Metabolic Panel:  Recent Labs Lab 03/31/13 0430  04/02/13 0550 04/02/13 1434 04/03/13 0420 04/04/13 0520 04/05/13 0500 04/06/13 0500  NA 137  < > 141  --  141 137 139  138  K 3.3*  < > 4.0  --  3.9 4.4 4.3 4.5  CL 105  < > 103  --  102 98 98 97  CO2 26  < > 32  --  33* 35* 36* 36*  GLUCOSE 83  < > 75  --  77 80 105* 80  BUN 30*  < > 18  --  16 12 10 11   CREATININE 1.07  < > 0.84 0.77 0.75 0.74 0.72 0.81  CALCIUM 7.5*  < > 8.0*  --  8.0* 8.0* 8.4 8.3*  MG 1.9  --   --   --   --   --   --   --   < > = values in this  interval not displayed. Liver Function Tests: No results found for this basename: AST, ALT, ALKPHOS, BILITOT, PROT, ALBUMIN,  in the last 168 hours CBC:  Recent Labs Lab 04/02/13 0550 04/02/13 1434 04/03/13 0420 04/05/13 0500 04/06/13 0500  WBC 6.9 8.8 8.0 7.0 7.0  HGB 10.5* 11.1* 10.4* 9.8* 9.6*  HCT 31.5* 32.7* 30.6* 29.3* 28.5*  MCV 88.0 88.1 88.7 89.6 89.3  PLT 116* 116* 140* 192 208   BNP (last 3 results)  Recent Labs  03/25/13 1240  PROBNP 14339.0*   CBG:  Recent Labs Lab 04/05/13 1201 04/05/13 1639 04/05/13 2100 04/05/13 2349 04/06/13 0407  GLUCAP 99 85 97 91 100*   Recent Results (from the past 240 hour(s))  SURGICAL PCR SCREEN     Status: None   Collection Time    04/02/13  8:40 AM      Result Value Range Status   MRSA, PCR NEGATIVE  NEGATIVE Final   Staphylococcus aureus NEGATIVE  NEGATIVE Final   Comment:            The Xpert SA Assay (FDA     approved for NASAL specimens     in patients over 106 years of age),     is one component of     a comprehensive surveillance     program.  Test performance has     been validated by [employer] for patients greater     than or equal to 62 year old.     It is not intended     to diagnose infection nor to     guide or monitor treatment.   Scheduled Meds: . antiseptic oral rinse  15 mL Mouth Rinse BID  . aspirin  81 mg Oral Daily  . atorvastatin  40 mg Oral q1800  . carvedilol  6.25 mg Oral BID WC  . chlordiazePOXIDE  10 mg Oral Daily  . feeding supplement (ENSURE COMPLETE)  237 mL Oral BID BM  . folic acid  1 mg Oral Daily  . furosemide  40 mg Oral Daily  . heparin subcutaneous  5,000 Units Subcutaneous Q8H  . lisinopril  5 mg Oral BID  . pantoprazole  40 mg Oral Daily  . sodium chloride  10-40 mL Intracatheter Q12H  . sodium chloride  3 mL Intravenous Q12H  . sodium chloride  3 mL Intravenous Q12H  . spironolactone  25 mg Oral Daily  . thiamine  100 mg Per Tube Daily   Continuous  Infusions:   Principal Problem:   Septic shock(785.52) Active Problems:   CHF (congestive heart failure)   HTN (hypertension)   Open wound of right foot   Ischemic foot   Smoking hx   Alcohol abuse  Bilateral leg edema   Severe sepsis(995.92)   Severe protein-calorie malnutrition   Septic shock   Acute respiratory failure with hypoxia   Alcohol withdrawal delirium   Acute systolic CHF (congestive heart failure), NYHA class 4  Time spent: 25 minutes.  Costin M. Cruzita Lederer, MD [facility] [phone]  If 7PM-7AM, please contact night-coverage [email or web address] Password [email or web address] 04/06/2013, 8:48 AM    LOS: 12 days

## 2013-04-06 NOTE — Consult Note (Signed)
Consultation  Referring Provider:   [facility]   Primary Care Physician:  No PCP Per Patient Primary Gastroenterologist: none       Reason for Consultation:  GI bleed            HPI:   Rodney [name] is a 68 y.o. male with a history of ETOH abuse transferred to [facility] from [facility] 12/24 with ischemic lower extremity, septic shock and resp failure requiring intubation and AKI. Patient had right AKA on 12/24. Echo this admission revealed acute systolic CHF with EF of 34%. He had a cardiac cath revealing diffuse 3 vessel disease, severe RCA and LAD lesions. Not great CABG candidate.  PCI was entertained but medical management planned for now.   Patient has received Heaprin injection and ASA this admission Maroon stools documented 12/30 but hgb was stable. Hgb stable mid 10 to 11 range for several days but over last couple of days has drifted slightly to 9.6. BUN is normal. Patient thinks he sees bright red blood with BMs sometimes at home. No abdominal pain. No nausea. No history of PUD. He does endorse weight loss.   Past Medical History  Diagnosis Date  . HTN (hypertension)   . Smoking hx 03/25/2013  . Alcohol abuse 03/25/2013  . CHF (congestive heart failure) 03/25/2013    Past Surgical History  Procedure Laterality Date  . Above knee leg amputation Right   . Amputation Right 03/26/2013    Procedure: AMPUTATION ABOVE KNEE;  Surgeon: Rosetta Posner, MD;  Location: [facility];  Service: Vascular;  Laterality: Right;    Hoffman: no colon cancer.   History  Substance Use Topics  . Smoking status: Current Every Day Smoker    Types: Cigarettes  . Smokeless tobacco: Not on file  . Alcohol Use: Yes     Comment: everyday    Prior to Admission medications   Medication Sig Start Date End Date Taking? Authorizing Provider  aspirin 81 MG chewable tablet Chew 81-162 mg by mouth daily as needed for moderate pain.   Yes Historical Provider, MD    Current Facility-Administered Medications    Medication Dose Route Frequency Provider Last Rate Last Dose  . 0.9 %  sodium chloride infusion  250 mL Intravenous PRN Elsie Stain, MD 20 mL/hr at 03/31/13 1000 250 mL at 03/31/13 1000  . 0.9 %  sodium chloride infusion  250 mL Intravenous PRN Larey Dresser, MD      . acetaminophen (TYLENOL) suppository 650 mg  650 mg Rectal Q6H PRN Radene Gunning, NP      . acetaminophen (TYLENOL) tablet 650 mg  650 mg Oral Q4H PRN Larey Dresser, MD      . alum & mag hydroxide-simeth (MAALOX/MYLANTA) 200-200-20 MG/5ML suspension 30 mL  30 mL Oral Q6H PRN Radene Gunning, NP      . antiseptic oral rinse (BIOTENE) solution 15 mL  15 mL Mouth Rinse BID Elsie Stain, MD   15 mL at 04/05/13 2000  . aspirin chewable tablet 81 mg  81 mg Oral Daily Thurnell Lose, MD   81 mg at 04/05/13 1047  . atorvastatin (LIPITOR) tablet 40 mg  40 mg Oral q1800 Larey Dresser, MD   40 mg at 04/05/13 1826  . carvedilol (COREG) tablet 6.25 mg  6.25 mg Oral BID WC Larey Dresser, MD   6.25 mg at 04/06/13 0547  . chlordiazePOXIDE (LIBRIUM) capsule 10 mg  10 mg  Oral Daily Rigoberto Noel, MD   10 mg at 04/05/13 1427  . feeding supplement (ENSURE COMPLETE) (ENSURE COMPLETE) liquid 237 mL  237 mL Oral BID BM Erlene Quan, RD   237 mL at 04/05/13 1430  . folic acid (FOLVITE) tablet 1 mg  1 mg Oral Daily Thurnell Lose, MD   1 mg at 04/05/13 1047  . furosemide (LASIX) tablet 40 mg  40 mg Oral Daily Larey Dresser, MD   40 mg at 04/05/13 1051  . heparin injection 5,000 Units  5,000 Units Subcutaneous Q8H Elsie Stain, MD   5,000 Units at 04/06/13 [phone]  . lisinopril (PRINIVIL,ZESTRIL) tablet 5 mg  5 mg Oral BID Larey Dresser, MD   5 mg at 04/06/13 0934  . morphine 2 MG/ML injection 2 mg  2 mg Intravenous Q1H PRN Rigoberto Noel, MD      . naloxone [facility]) injection 0.4 mg  0.4 mg Intravenous PRN Colbert Coyer, MD   0.4 mg at 03/26/13 0017  . ondansetron (ZOFRAN) injection 4 mg  4 mg Intravenous Q6H PRN Radene Gunning, NP      . oxyCODONE (Oxy IR/ROXICODONE) immediate release tablet 5-10 mg  5-10 mg Oral Q4H PRN Hulen Shouts Rhyne, PA-C   5 mg at 04/01/13 2240  . pantoprazole (PROTONIX) EC tablet 40 mg  40 mg Oral Daily [name], MD   40 mg at 04/05/13 1047  . sodium chloride 0.9 % injection 10-40 mL  10-40 mL Intracatheter Q12H Jamesetta So, MD   20 mL at 03/31/13 0915  . sodium chloride 0.9 % injection 10-40 mL  10-40 mL Intracatheter PRN Jamesetta So, MD   30 mL at 04/06/13 0849  . sodium chloride 0.9 % injection 3 mL  3 mL Intravenous Q12H Radene Gunning, NP   3 mL at 04/02/13 2108  . sodium chloride 0.9 % injection 3 mL  3 mL Intravenous Q12H Larey Dresser, MD   3 mL at 04/05/13 1050  . sodium chloride 0.9 % injection 3 mL  3 mL Intravenous PRN Larey Dresser, MD      . spironolactone (ALDACTONE) tablet 25 mg  25 mg Oral Daily Larey Dresser, MD   25 mg at 04/05/13 1427  . thiamine (VITAMIN B-1) tablet 100 mg  100 mg Per Tube Daily Elsie Stain, MD   100 mg at 04/05/13 1047    Allergies as of 03/25/2013  . (No Known Allergies)    Review of Systems:    All systems reviewed and negative except where noted in HPI.   Physical Exam:  Vital signs in last 24 hours: Temp:  [97.3 F (36.3 C)-97.9 F (36.6 C)] 97.9 F (36.6 C) (01/04 0408) Pulse Rate:  [71-76] 71 (01/04 0938) Resp:  [20] 20 (01/04 0408) BP: (101-115)/(47-61) 115/61 mmHg (01/04 0938) SpO2:  [97 %-100 %] 97 % (01/04 0408) Weight:  [117 lb 4.6 oz (53.2 kg)] 117 lb 4.6 oz (53.2 kg) (01/04 0408) Last BM Date: 04/05/13 General:   Pleasant thin black male in NAD, cachectic and malnourished Head:  Normocephalic and atraumatic. Eyes:   No icterus.   Conjunctiva pink. Ears:  Normal auditory acuity. Neck:  Supple; no masses felt Lungs:  Respirations even and unlabored. Lungs clear to auscultation bilaterally.   No wheezes, crackles, or rhonchi.  Heart:  Regular rate and rhythm Abdomen:  Soft, nondistended, nontender.  Normal bowel sounds. No appreciable masses  or hepatomegaly.  Rectal:  Yellowish stool in vault.  Msk:  Symmetrical without gross deformities.  Extremities:  Right bka with edema at stump Neurologic:  Alert and  oriented x4;  grossly normal neurologically. Skin:  Sacral dressing. Cervical Nodes:  No significant cervical adenopathy. Psych:  Alert and cooperative. Normal affect.  LAB RESULTS:  Recent Labs  04/05/13 0500 04/06/13 0500  WBC 7.0 7.0  HGB 9.8* 9.6*  HCT 29.3* 28.5*  PLT 192 208   BMET  Recent Labs  04/04/13 0520 04/05/13 0500 04/06/13 0500  NA 137 139 138  K 4.4 4.3 4.5  CL 98 98 97  CO2 35* 36* 36*  GLUCOSE 80 105* 80  BUN 12 10 11   CREATININE 0.74 0.72 0.81  CALCIUM 8.0* 8.4 8.3*   PREVIOUS ENDOSCOPIES:            None   Impression / Plan:   1. Heme + stool, decreased Hgb and nursing staff reports intermittent maroon stools over last several days. Only yellow fecal material in vault on DRE today. His hgb has remained relatively stable.   2. He was admitted with ischemic lower extremity, septic shock, resp failure,and AKI. Patient had right AKA on 12/24. Sepsis resolving. Resp. Failure resolved. AKI resolved.   3. Acute systolic CHF with EF of 66%. Cardiac cath revealing diffuse 3 vessel disease, severe RCA and LAD lesions. Not great CABG candidate.  Cardiomyopathy felt to be related rto CAD and ETOH. PCI was entertained but medical management planned for now.   4. Elevated PSA of 12  5. ETOH abuse. His low platelet count early in admission was likley from sepsis though can't exclude chronic liver disease  Thanks   LOS: 12 days   Tye Savoy  04/06/2013, 9:48 AM    [facility] Attending  I have also seen and assessed the patient and agree with the above note. I think he most likely had transient gut ischemia related to shock and had some bleeding from that which has cleared. I would not do a GI work-up on him at this point but would treat with  daily PPI. I did check a B12, ferritin  and folate level. Will f/u in AM to see these results and to see if they would alter plans above. Iron deficiency might. Have discontinued hemoccults as will not change what we do.  Gatha Mayer, MD, [facility] [phone] (pager) 04/06/2013 12:34 PM

## 2013-04-06 NOTE — Progress Notes (Signed)
NT reported that pt.'s B/P was 92/51 with Dinamap. Pt. Was asymptomatic when I went in to assess him. I rechecked pt.'s B/P and is was 107/56.

## 2013-04-06 NOTE — Progress Notes (Signed)
Pt. Stable and showed no signs or symptoms of distress.

## 2013-04-07 DIAGNOSIS — R195 Other fecal abnormalities: Secondary | ICD-10-CM

## 2013-04-07 LAB — GLUCOSE, CAPILLARY
Glucose-Capillary: 103 mg/dL — ABNORMAL HIGH (ref 70–99)
Glucose-Capillary: 114 mg/dL — ABNORMAL HIGH (ref 70–99)
Glucose-Capillary: 115 mg/dL — ABNORMAL HIGH (ref 70–99)
Glucose-Capillary: 75 mg/dL (ref 70–99)
Glucose-Capillary: 88 mg/dL (ref 70–99)
Glucose-Capillary: 99 mg/dL (ref 70–99)

## 2013-04-07 LAB — CBC
HEMATOCRIT: 29.8 % — AB (ref 39.0–52.0)
Hemoglobin: 9.9 g/dL — ABNORMAL LOW (ref 13.0–17.0)
MCH: 29.7 pg (ref 26.0–34.0)
MCHC: 33.2 g/dL (ref 30.0–36.0)
MCV: 89.5 fL (ref 78.0–100.0)
Platelets: 247 10*3/uL (ref 150–400)
RBC: 3.33 MIL/uL — AB (ref 4.22–5.81)
RDW: 17.1 % — ABNORMAL HIGH (ref 11.5–15.5)
WBC: 7.2 10*3/uL (ref 4.0–10.5)

## 2013-04-07 LAB — BASIC METABOLIC PANEL
BUN: 11 mg/dL (ref 6–23)
CHLORIDE: 98 meq/L (ref 96–112)
CO2: 32 mEq/L (ref 19–32)
Calcium: 8.4 mg/dL (ref 8.4–10.5)
Creatinine, Ser: 0.76 mg/dL (ref 0.50–1.35)
GFR calc non Af Amer: >90 mL/min (ref >90)
GLUCOSE: 87 mg/dL (ref 70–99)
Potassium: 4.8 mEq/L (ref 3.7–5.3)
SODIUM: 137 meq/L (ref 137–147)

## 2013-04-07 LAB — FOLATE: Folate: >20 ng/mL

## 2013-04-07 LAB — VITAMIN B12: Vitamin B-12: 629 pg/mL (ref 211–911)

## 2013-04-07 LAB — FERRITIN: Ferritin: 583 ng/mL — ABNORMAL HIGH (ref 22–322)

## 2013-04-07 MED ORDER — OXYCODONE HCL 5 MG PO TABS: 5.0000 mg | ORAL_TABLET | ORAL | Status: DC | PRN

## 2013-04-07 MED ORDER — SPIRONOLACTONE 25 MG PO TABS: 12.5000 mg | ORAL_TABLET | Freq: Every day | ORAL | Status: DC

## 2013-04-07 MED ORDER — ATORVASTATIN CALCIUM 40 MG PO TABS: 40.0000 mg | ORAL_TABLET | Freq: Every day | ORAL | Status: DC

## 2013-04-07 MED ORDER — LORAZEPAM 1 MG PO TABS: 1.0000 mg | ORAL_TABLET | Freq: Three times a day (TID) | ORAL | Status: DC | PRN

## 2013-04-07 MED ORDER — PANTOPRAZOLE SODIUM 40 MG PO TBEC: 40.0000 mg | DELAYED_RELEASE_TABLET | Freq: Every day | ORAL | Status: DC

## 2013-04-07 MED ORDER — SPIRONOLACTONE 12.5 MG HALF TABLET
12.5000 mg | ORAL_TABLET | Freq: Every day | ORAL | Status: DC
  Administered 2013-04-07: 12:00:00 via ORAL
  Filled 2013-04-07: qty 1

## 2013-04-07 MED ORDER — ENSURE COMPLETE PO LIQD: 237.0000 mL | Freq: Two times a day (BID) | ORAL | Status: DC

## 2013-04-07 MED ORDER — SPIRONOLACTONE 25 MG PO TABS: 25.0000 mg | ORAL_TABLET | Freq: Every day | ORAL | Status: DC

## 2013-04-07 MED ORDER — FOLIC ACID 1 MG PO TABS: 1.0000 mg | ORAL_TABLET | Freq: Every day | ORAL | Status: DC

## 2013-04-07 MED ORDER — LISINOPRIL 5 MG PO TABS: 5.0000 mg | ORAL_TABLET | Freq: Two times a day (BID) | ORAL | Status: DC

## 2013-04-07 MED ORDER — FUROSEMIDE 40 MG PO TABS: 40.0000 mg | ORAL_TABLET | Freq: Every day | ORAL | Status: DC

## 2013-04-07 MED ORDER — THIAMINE HCL 100 MG PO TABS: 100.0000 mg | ORAL_TABLET | Freq: Every day | ORAL | Status: DC

## 2013-04-07 MED ORDER — SPIRONOLACTONE 12.5 MG HALF TABLET
12.5000 mg | ORAL_TABLET | Freq: Every day | ORAL | Status: DC
Start: 2013-04-07 — End: 2013-04-07

## 2013-04-07 MED ORDER — CARVEDILOL 6.25 MG PO TABS: 6.2500 mg | ORAL_TABLET | Freq: Two times a day (BID) | ORAL | Status: DC

## 2013-04-07 NOTE — Progress Notes (Signed)
    Progress Note   Subjective  Being discharged today to \\snf .  Sitting up in bed.  Tells me he's eating well.  NO overt GI bleeding.   Objective   Vital signs in last 24 hours: Temp:  [97.8 F (36.6 C)-99 F (37.2 C)] 98.5 F (36.9 C) (01/05 0505) Pulse Rate:  [70-75] 75 (01/05 0505) Resp:  [18-20] 18 (01/05 0505) BP: (92-115)/(47-61) 105/55 mmHg (01/05 0505) SpO2:  [97 %-100 %] 100 % (01/05 0505) Weight:  [106 lb 3.2 oz (48.172 kg)] 106 lb 3.2 oz (48.172 kg) (01/05 0446) Last BM Date: 04/06/13 General:    white male in NAD Heart:  Regular rate and rhythm; no murmurs Lungs: Respirations even and unlabored, lungs CTA bilaterally Abdomen:  Soft, nontender and nondistended. Normal bowel sounds. Neurologic:  Alert and oriented,  grossly normal neurologically. Psych:  Cooperative. Normal mood and affect.  Lab Results:  Recent Labs  04/05/13 0500 04/06/13 0500 04/07/13 0544  WBC 7.0 7.0 7.2  HGB 9.8* 9.6* 9.9*  HCT 29.3* 28.5* 29.8*  PLT 192 208 247   BMET  Recent Labs  04/05/13 0500 04/06/13 0500 04/07/13 0544  NA 139 138 137  K 4.3 4.5 4.8  CL 98 97 98  CO2 36* 36* 32  GLUCOSE 105* 80 87  BUN 10 11 11   CREATININE 0.72 0.81 0.76  CALCIUM 8.4 8.3* 8.4     Assessment / Plan:   1. Heme + stool, decreased Hgb and nursing staff reports intermittent maroon stools over last several days. Only yellow fecal material in vault on DRE yesterday. His hgb has remained stable mid 9 range over last few days.  No plans for endoscopic workup, at least inpatient.    2.  ischemic lower extremity, s/p right AKA on 12/24.    3. Acute systolic CHF with EF of 33%. Cardiac cath revealing diffuse 3 vessel disease, severe RCA and LAD lesions. Not great CABG candidate. Cardiomyopathy felt to be related rto CAD and ETOH. PCI was entertained but medical management planned for now.   4. Elevated PSA of 12   5. ETOH abuse. His low platelet count early in admission was likley from sepsis.  Count now up to 247.   He has appt with Dr. Carlean Purl in about 4 weeks as an outpatient to consider GI workup for his anemia.  Clearly not overtly bleeding and given his recent amputation, very low EF GI testing is not advisable now, may not need in future either depending on how he does clinicall.

## 2013-04-07 NOTE — Discharge Summary (Addendum)
Physician Discharge Summary  Rodney [name] [id number] DOB: 1945/09/21 DOA: 03/25/2013  PCP: No PCP Per Patient  Admit date: 03/25/2013 Discharge date: 04/07/2013  Time spent: 35 minutes  Recommendations for Outpatient Follow-up:  1. Fpllow up with cardiology in 2-4 weeks 2. Follow up with GI in 2-4 weeks 3. Follow up with Vascular Surgery in 3 weeks   Recommendations for primary care physician for things to follow:  Recheck CBC, CMP  Discharge Diagnoses:  Principal Problem:   Septic shock(785.52) Active Problems:   CHF (congestive heart failure)   HTN (hypertension)   Open wound of right foot   Ischemic foot   Smoking hx   Alcohol abuse   Bilateral leg edema   Severe sepsis(995.92)   Severe protein-calorie malnutrition   Septic shock   Acute respiratory failure with hypoxia   Alcohol withdrawal delirium   Acute systolic CHF (congestive heart failure), NYHA class 4   Heme + stool   Decreased hemoglobin or hematocrit   Discharge Condition: stable  Diet recommendation: heart healthy  Filed Weights   04/06/13 0408 04/07/13 0446 04/07/13 1048  Weight: 53.2 kg (117 lb 4.6 oz) 48.172 kg (106 lb 3.2 oz) 48.58 kg (107 lb 1.6 oz)    History of present illness:  68 y.o.AAM with ETOH abuse, HTN, s/p injury to R foot one month ago and non healing wound. Adm to [facility] 12/23 with bilat leg pain. Pt progressed to shock, resp failure early this am 12/24. Pt intubated, CVL placed and pt tfr to [facility] from [facility]. Pt with CHF hx. Not eating well.   Hospital Course:  GI bleed - continue Protonix. CBC stable. GI has been consulted and think that his bleed is possibly related to gut ischemia in the setting of shock and to continue PPI for now without needs for acute interventions.  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  - patient underwent cardiac  catheterization 12/31 with RCA and LAD lesions - cardiology has been consulted while patient hospitalized and recommended medical management for now.  Septic shock/Escherichia coli & Morganella morganii bacteremia - Secondary to Escherichia coli & Morganella morganii bacteremia-? Source. Antibiotics narrowed to ceftriaxone alone and patient finished 10 days antibiotics last day on 04/03/13. Acute respiratory failure  - Secondary to decompensated CHF, RLL CAP and septic shock, extubated 12/27.  - Stable, now on room air.  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 He may need intervention to left leg at later date but has no complaints of left foot pain at this time. Patient needs to followup with Dr. Sherren Mocha Early in 3 weeks for staple removal.  Hypertension- Controlled  History of alcohol abuse- No features of alcohol withdrawal at this time. Currently on Librium, thiamine and multivitamins. Wean off librium Tobacco abuse - Cessation counseled  Hypokalemia - Replaced  Anemia - Hemoglobin stable  Thrombocytopenia - Likely secondary to sepsis in the setting of chronic ETOH. Platelets gradually increasing   Procedures:  2D echo Study Conclusions - Left ventricle: The cavity size was mildly dilated. Wall thickness was normal. The estimated ejection fraction was 15%. Severe diffuse hypokinesis.  Cardiac cath  Diffuse 3 vessel disease, most significant lesions being 80-90% proximal LAD stenosis and serial 75% RCA stenoses  Consultations:  Cardiology  Gastroenterology  Discharge Exam: Filed Vitals:   04/07/13 0505 04/07/13 1048 04/07/13 1100  04/07/13 1400  BP: 105/55  97/46 96/48  Pulse: 75  77 70  Temp: 98.5 F (36.9 C)   97.9 F (36.6 C)  TempSrc: Oral   Oral  Resp: 18   17  Height:      Weight:  48.58 kg (107 lb 1.6 oz)    SpO2: 100%   100%    General: NAD Cardiovascular: RRR Respiratory: CTA biL  Discharge Instructions         Future Appointments Provider Department Dept Phone   04/14/2013 2:00 PM [facility] [phone]   04/29/2013 8:45 AM Rosetta Posner, MD [facility] [phone]   05/06/2013 8:45 AM Gatha Mayer, MD [facility] [phone]       Medication List         aspirin 81 MG chewable tablet  Chew 81-162 mg by mouth daily as needed for moderate pain.     atorvastatin 40 MG tablet  Commonly known as:  LIPITOR  Take 1 tablet (40 mg total) by mouth daily at 6 PM.     carvedilol 6.25 MG tablet  Commonly known as:  COREG  Take 1 tablet (6.25 mg total) by mouth 2 (two) times daily with a meal.     feeding supplement (ENSURE COMPLETE) Liqd  Take 237 mLs by mouth 2 (two) times daily between meals.     folic acid 1 MG tablet  Commonly known as:  FOLVITE  Take 1 tablet (1 mg total) by mouth daily.     furosemide 40 MG tablet  Commonly known as:  LASIX  Take 1 tablet (40 mg total) by mouth daily.     lisinopril 5 MG tablet  Commonly known as:  PRINIVIL,ZESTRIL  Take 1 tablet (5 mg total) by mouth 2 (two) times daily.     LORazepam 1 MG tablet  Commonly known as:  ATIVAN  Take 1 tablet (1 mg total) by mouth every 8 (eight) hours as needed for anxiety.     oxyCODONE 5 MG immediate release tablet  Commonly known as:  Oxy IR/ROXICODONE  Take 1 tablet (5 mg total) by mouth every 4 (four) hours as needed for moderate pain.     pantoprazole 40 MG tablet  Commonly known as:  PROTONIX  Take 1 tablet (40 mg total) by mouth daily.     spironolactone 25 MG tablet  Commonly known as:  ALDACTONE  Take 0.5 tablets (12.5 mg total) by mouth daily.     thiamine 100 MG tablet  Place 1 tablet (100 mg total) into feeding tube daily.       Follow-up Information   Follow up with EARLY, TODD, MD In 4 weeks. (Office will call to arrange your appt (sent))    Specialty:  Vascular Surgery    Contact information:   [address] [place] [address] [phone]       Follow up with Silvano Rusk, MD On 05/06/2013. (at 8:45am)    Specialty:  Gastroenterology   Contact information:   [address] [place] [address] [phone]       Follow up with Glori Bickers, MD On 04/14/2013. (@ 2:00 pm; Garage code [id number])    Specialty:  Cardiology   Contact information:   [address] [place] [address] [phone]      The results of significant diagnostics from this hospitalization (including imaging, microbiology, ancillary and laboratory) are listed below  for reference.    Significant Diagnostic Studies: Dg Chest 2 View  03/25/2013   CLINICAL DATA:  Weakness, lower extremity edema, and dyspnea  EXAM: CHEST  2 VIEW  COMPARISON:  None.  FINDINGS: The lungs are adequately inflated. The cardiopericardial silhouette is enlarged. There is a small left pleural effusion blunting the costophrenic angles. The pulmonary vascularity is mildly prominent centrally. Definite cephalization is not demonstrated. The mediastinum is normal in width. The trachea is midline.  IMPRESSION: The findings are consistent with low grade CHF with small left-sided pleural effusion and mild central pulmonary vascular congestion.   Electronically Signed   By: David  Martinique   On: 03/25/2013 13:15   US Arterial Seg Multiple  03/25/2013   CLINICAL DATA:  Cold feet, right lower leg numbness and nonhealing wounds post blunt trauma.  EXAM: NONINVASIVE PHYSIOLOGIC VASCULAR STUDY OF BILATERAL LOWER EXTREMITIES  TECHNIQUE: Evaluation of both lower extremities was performed at rest, including calculation of ankle-brachial indices, multiple segmental pressure evaluation, segmental Doppler and segmental pulse volume recording.  COMPARISON:  None.  FINDINGS: Right ABI: Not calculable due to no Doppler arterial flow signal at the ankle level.  Left ABI: Not calculable due to no Doppler arterial flow  signal at the ankle level.  Right Lower Extremity: Biphasic waveforms through the SFA, with no Doppler arterial flow signal from the popliteal artery inferiorly.  Left Lower Extremity: Biphasic waveforms through the popliteal artery, with no Doppler arterial flow signal inferiorly.  IMPRESSION: Bilateral lower extremity severe tissue-threatening arterial occlusive disease as above   Electronically Signed   By: Arne Cleveland M.D.   On: 03/25/2013 20:14   Dg Chest Port 1 View  03/29/2013   CLINICAL DATA:  Shock and respiratory failure and right lower extremity gangrene. , history of CHF  EXAM: PORTABLE CHEST - 1 VIEW  COMPARISON:  Chest x-ray of March 28, 2013.  FINDINGS: The lungs are adequately inflated. The left hemidiaphragm remains obscured in the retrocardiac region on the left remains dense. The cardiopericardial silhouette is mildly enlarged. The pulmonary vascularity is engorged. The pulmonary interstitial markings however less conspicuous today. There is a right subclavian venous catheter in place with the tip in the region of the proximal to mid SVC. The endotracheal tube tip lies lies 3.2 cm above the crotch of the carina. The esophagogastric tube tip and proximal port lies in the region of the distal portion of the stomach.  IMPRESSION: 1. There is improved appearance of the pulmonary interstitium bilaterally. There remains left lower lobe atelectasis and a possible small left pleural effusion. There is no pneumothorax. 2. The cardiopericardial silhouette remains mildly enlarged. The pulmonary vascularity is prominent centrally but stable. 3. The support tubes and lines appear to be in appropriate position.   Electronically Signed   By: David  Martinique   On: 03/29/2013 09:14   Dg Chest Port 1 View  03/28/2013   CLINICAL DATA:  Respiratory failure, pneumonia  EXAM: PORTABLE CHEST - 1 VIEW  COMPARISON:  03/27/2013; 03/25/2013  FINDINGS: Grossly unchanged enlarged cardiac silhouette and  mediastinal contours. Atherosclerotic plaque within the thoracic aorta. Interval advancement of enteric tube with tip and side port now projecting below the left hemidiaphragm. Otherwise, stable positioning of remaining support apparatus. A skin fold overlies the peripheral aspect of the right upper lung. No definite pneumothorax. Pulmonary vasculatures appears less distinct on the present examination with cephalization of flow. There is a minimal amount of fluid tracking within the right minor fissure. Grossly unchanged  perihilar and left basilar/retrocardiac opacities. Trace bilateral effusions are not excluded. Unchanged bones.  IMPRESSION: 1. Appropriately positioned support apparatus as above. No pneumothorax. 2. Suspected worsening pulmonary edema. 3. Grossly unchanged perihilar and left basilar opacities, atelectasis versus infiltrate.   Electronically Signed   By: Sandi Mariscal M.D.   On: 03/28/2013 08:13   Dg Chest Port 1 View  03/27/2013   CLINICAL DATA:  Respiratory distress  EXAM: PORTABLE CHEST - 1 VIEW  COMPARISON:  03/26/2013  FINDINGS: Nasogastric tube has retracted now lying with its tip projected near the tip of the endotracheal tube. This is likely in the upper thoracic esophagus.  Endotracheal tube and right subclavian central venous line are stable in well positioned.  Cardiac silhouette is mildly enlarged. There is mild lung base opacity bilaterally likely combination of small effusions and atelectasis. This is greater on the left. No overt pulmonary edema. No pneumothorax.  IMPRESSION: Endotracheal tube has retracted. The tip now projects in the upper thoracic esophagus. It will need to be repositioned.  No other change from the previous day's study.   Electronically Signed   By: Lajean Manes M.D.   On: 03/27/2013 08:27   Portable Chest Xray  03/26/2013   CLINICAL DATA:  Endotracheal tube placement. Central line placement.  EXAM: PORTABLE CHEST - 1 VIEW  COMPARISON:  Chest radiograph  performed 03/25/2013  FINDINGS: The patient's endotracheal tube is seen ending 4 cm above the carina. A right subclavian line is noted ending about the mid to distal SVC. An enteric tube is seen extending below the diaphragm.  New retrocardiac airspace opacity may reflect atelectasis or possibly pneumonia. Vascular congestion is noted. No pleural effusion or pneumothorax is seen.  The cardiomediastinal silhouette is enlarged. No acute osseous abnormalities are identified.  IMPRESSION: 1. Endotracheal tube seen ending 4 cm above the carina. 2. Right subclavian line noted ending about the mid to distal SVC. 3. New retrocardiac airspace opacity may reflect atelectasis or possibly pneumonia. 4. Vascular congestion and cardiomegaly noted.   Electronically Signed   By: Garald Balding M.D.   On: 03/26/2013 04:16   Dg Abd Portable 1v  03/27/2013   CLINICAL DATA:  Orogastric tube placement  EXAM: PORTABLE ABDOMEN - 1 VIEW  COMPARISON:  None.  FINDINGS: Orogastric tube tip and side port are in the stomach. The bowel gas pattern is unremarkable. No free air seen on this supine examination. There is left lower lobe consolidation.  IMPRESSION: Orogastric tube tip and side port in stomach. Visualized bowel gas unremarkable. Left lower lobe consolidation.   Electronically Signed   By: Lowella Grip M.D.   On: 03/27/2013 15:20   Dg Foot Complete Right  03/25/2013   CLINICAL DATA:  Stent necrosis findings concerning for gangrene  EXAM: RIGHT FOOT COMPLETE - 3+ VIEW  COMPARISON:  None.  FINDINGS: There is no evidence of fracture nor dislocation. There is no evidence of subcutaneous emphysema the regions of cortical destruction. A small osseous flecks projects along the lateral border of the base of the 5th metatarsal. This may represent sequela of a chronic avulsion fragment.  IMPRESSION: No radiographic evidence of focal or acute abnormalities. There is no radiographic evidence to raise concern of osteomyelitis.    Electronically Signed   By: Margaree Mackintosh M.D.   On: 03/25/2013 16:45   Mr Card Morphology Wo/w Cm  04/04/2013   CLINICAL DATA:  CAD and Cardiomyopathy Assess Viability  EXAM: CARDIAC MRI  TECHNIQUE: The patient was scanned on  a 1.5 Tesla GE magnet. A dedicated cardiac coil was used. Functional imaging was done using Fiesta sequences. 2,3, and 4 chamber views were done to assess for RWMA's. Modified Simpson's rule using a short axis stack was used to calculate an ejection fraction on a dedicated work Conservation officer, nature. The patient received 22 cc of Multihance. After 10 minutes inversion recovery sequences were used to assess for infiltration and scar tissue.  CONTRAST:  22 cc Multihance  FINDINGS: There was severe LVE. There was diffuse hypokinesis. The LA/RA were mildly dilated. The RV was mildly dilated. There was no ASD/VSD. There was a small to moderate pericardial effusion. There were bilateral pleural effusions. The mitral annulus was dilated with MR. The aortic valve was not well assessed but there was significant AR. The ascending aortic root was normal 3.0 cm. The quantitative EF was 23% (EDV 308cc ESV 236 cc SV 72 cc) There was subendocardial scar in the anterolateral wall, lateral wall and small area of the inferior apex. There were no areas of non viability.  IMPRESSION: 1) Severe LVE with diffuse hypokinesis EF 23%  2) Subendocardial scar involving the anterolateral wall, lateral wall and small area of inferoapex. No areas of non viability. Overall suggests more  Non ischemic cardiomyopathy from ETOH or possibly valve disease  3) Significant AR suggest echo correlation Normal ascending aortic root 3.0 cm  4) MR  5) Small to moderate pericardial effusion  6) Bilateral pleural effusions  7) Biatrial enlargement  8) Mild RV enlargement  Note overall quality of study was poor due to patient motion and breathing and poor blood pool uptake of gadolinium  Jenkins Rouge   Electronically Signed    By: Jenkins Rouge M.D.   On: 04/04/2013 10:37    Microbiology: Recent Results (from the past 240 hour(s))  SURGICAL PCR SCREEN     Status: None   Collection Time    04/02/13  8:40 AM      Result Value Range Status   MRSA, PCR NEGATIVE  NEGATIVE Final   Staphylococcus aureus NEGATIVE  NEGATIVE Final   Comment:            The Xpert SA Assay (FDA     approved for NASAL specimens     in patients over 39 years of age),     is one component of     a comprehensive surveillance     program.  Test performance has     been validated by [employer] for patients greater     than or equal to 73 year old.     It is not intended     to diagnose infection nor to     guide or monitor treatment.     Labs: Basic Metabolic Panel:  Recent Labs Lab 04/03/13 0420 04/04/13 0520 04/05/13 0500 04/06/13 0500 04/07/13 0544  NA 141 137 139 138 137  K 3.9 4.4 4.3 4.5 4.8  CL 102 98 98 97 98  CO2 33* 35* 36* 36* 32  GLUCOSE 77 80 105* 80 87  BUN 16 12 10 11 11   CREATININE 0.75 0.74 0.72 0.81 0.76  CALCIUM 8.0* 8.0* 8.4 8.3* 8.4   CBC:  Recent Labs Lab 04/02/13 1434 04/03/13 0420 04/05/13 0500 04/06/13 0500 04/07/13 0544  WBC 8.8 8.0 7.0 7.0 7.2  HGB 11.1* 10.4* 9.8* 9.6* 9.9*  HCT 32.7* 30.6* 29.3* 28.5* 29.8*  MCV 88.1 88.7 89.6 89.3 89.5  PLT 116* 140*  192 208 247   Cardiac Enzymes:  Recent Labs Lab 04/03/13 2030  TROPONINI <0.30   BNP: BNP (last 3 results)  Recent Labs  03/25/13 1240  PROBNP 14339.0*   CBG:  Recent Labs Lab 04/06/13 2052 04/07/13 0011 04/07/13 0425 04/07/13 0904 04/07/13 1119  GLUCAP 115* 99 75 103* 114*   Signed:  GHERGHE, COSTIN  [facility] 04/07/2013, 2:30 PM

## 2013-04-07 NOTE — Progress Notes (Signed)
Chaplain offered compassion and care through his presence, conversation and empathic listening.   04/07/13 1600  Clinical Encounter Type  Visited With Patient  Visit Type Spiritual support  Spiritual Encounters  Spiritual Needs Emotional    Estelle June, chaplain, pager [phone]

## 2013-04-07 NOTE — Progress Notes (Signed)
Report called to pt's nurse Jamesetta Geralds at [facility] care center.  Verbalized understanding.  Karie Kirks,  Therapist, sports.

## 2013-04-07 NOTE — Discharge Instructions (Signed)
Heart Failure Heart failure means your heart has trouble pumping blood. This makes it hard for your body to work well. Heart failure is usually a long-term (chronic) condition. You must take good care of yourself and follow your doctor's treatment plan. HOME CARE  Take your heart medicine as told by your doctor.  Do not stop taking medicine unless your doctor tells you to.  Do not skip any dose of medicine.  Refill your medicines before they run out.  Take other medicines only as told by your doctor or pharmacist.  Stay active if told by your doctor. The elderly and people with severe heart failure should talk with a doctor about physical activity.  Eat heart healthy foods. Choose foods that are without trans fat and are low in saturated fat, cholesterol, and salt (sodium). This includes fresh or frozen fruits and vegetables, fish, lean meats, fat-free or low-fat dairy foods, whole grains, and high-fiber foods. Lentils and dried peas and beans (legumes) are also good choices.  Limit salt if told by your doctor.  Cook in a healthy way. Roast, grill, broil, bake, poach, steam, or stir-fry foods.  Limit fluids as told by your doctor.  Weigh yourself every morning. Do this after you pee (urinate) and before you eat breakfast. Write down your weight to give to your doctor.  Take your blood pressure and write it down if your doctor tell you to.  Ask your doctor how to check your pulse. Check your pulse as told.  Lose weight if told by your doctor.  Stop smoking or chewing tobacco. Do not use gum or patches that help you quit without your doctor's approval.  Schedule and go to doctor visits as told.  Nonpregnant women should have no more than 1 drink a day. Men should have no more than 2 drinks a day. Talk to your doctor about drinking alcohol.  Stop illegal drug use.  Stay current with shots (immunizations).  Manage your health conditions as told by your doctor.  Learn to manage  your stress.  Rest when you are tired.  If it is really hot outside:  Avoid intense activities.  Use air conditioning or fans, or get in a cooler place.  Avoid caffeine and alcohol.  Wear loose-fitting, lightweight, and light-colored clothing.  If it is really cold outside:  Avoid intense activities.  Layer your clothing.  Wear mittens or gloves, a hat, and a scarf when going outside.  Avoid alcohol.  Learn about heart failure and get support as needed.  Get help to maintain or improve your quality of life and your ability to care for yourself as needed. GET HELP IF:   You gain 03 lb/1.4 kg or more in 1 day or 05 lb/2.3 kg in a week.  You are more short of breath than usual.  You cannot do your normal activities.  You tire easily.  You cough more than normal, especially with activity.  You have any or more puffiness (swelling) in areas such as your hands, feet, ankles, or belly (abdomen).  You cannot sleep because it is hard to breathe.  You feel like your heart is beating fast (palpitations).  You get dizzy or lightheaded when you stand up. GET HELP RIGHT AWAY IF:   You have trouble breathing.  There is a change in mental status, such as becoming less alert or not being able to focus.  You have chest pain or discomfort.  You faint. MAKE SURE YOU:   Understand these   instructions.  Will watch your condition.  Will get help right away if you are not doing well or get worse. Document Released: 12/28/2007 Document Revised: 07/15/2012 Document Reviewed: 10/19/2011 Exit[employer] Patient Information 2014 [employer].  

## 2013-04-07 NOTE — Progress Notes (Signed)
Patient ID: Rodney [name], male   DOB: [birth year], 68 y.o.   MRN: [id number]   SUBJECTIVE: Stable overnight. Denies SOB, orthopnea or CP. AxOx3. Weight down 11 lbs, not sure accurate (will reweigh). 24 hr I/O +740 cc.  Marland Kitchen antiseptic oral rinse  15 mL Mouth Rinse BID  . aspirin  81 mg Oral Daily  . atorvastatin  40 mg Oral q1800  . carvedilol  6.25 mg Oral BID WC  . chlordiazePOXIDE  10 mg Oral Daily  . feeding supplement (ENSURE COMPLETE)  237 mL Oral BID BM  . folic acid  1 mg Oral Daily  . furosemide  40 mg Oral Daily  . heparin subcutaneous  5,000 Units Subcutaneous Q8H  . lisinopril  5 mg Oral BID  . pantoprazole  40 mg Oral Daily  . sodium chloride  10-40 mL Intracatheter Q12H  . sodium chloride  3 mL Intravenous Q12H  . sodium chloride  3 mL Intravenous Q12H  . spironolactone  25 mg Oral Daily  . thiamine  100 mg Per Tube Daily      Filed Vitals:   04/06/13 2104 04/06/13 2207 04/07/13 0446 04/07/13 0505  BP: 92/47 101/50  105/55  Pulse: 71 75  75  Temp: 99 F (37.2 C)   98.5 F (36.9 C)  TempSrc: Oral   Oral  Resp: 20   18  Height:      Weight:   106 lb 3.2 oz (48.172 kg)   SpO2: 100%   100%    Intake/Output Summary (Last 24 hours) at 04/07/13 0948 Last data filed at 04/07/13 0900  Gross per 24 hour  Intake    870 ml  Output    400 ml  Net    470 ml    LABS: Basic Metabolic Panel:  Recent Labs  04/06/13 0500 04/07/13 0544  NA 138 137  K 4.5 4.8  CL 97 98  CO2 36* 32  GLUCOSE 80 87  BUN 11 11  CREATININE 0.81 0.76  CALCIUM 8.3* 8.4   Liver Function Tests: No results found for this basename: AST, ALT, ALKPHOS, BILITOT, PROT, ALBUMIN,  in the last 72 hours No results found for this basename: LIPASE, AMYLASE,  in the last 72 hours CBC:  Recent Labs  04/06/13 0500 04/07/13 0544  WBC 7.0 7.2  HGB 9.6* 9.9*  HCT 28.5* 29.8*  MCV 89.3 89.5  PLT 208 247    PHYSICAL EXAM General: NAD Neck: JVP not elevated, no thyromegaly or thyroid nodule.   Lungs: Slightly decreased breath sounds at bases. CV: Lateral PMI.  Heart regular S1/S2, no S3/S4, 2/6 HSM at apex, 1/6 early diastolic murmur left sternal border.  Trace left ankle edema. Abdomen: Soft, nontender, no hepatosplenomegaly, no distention.  Neurologic: Alert and oriented to date.  Psych: Normal affect. Extremities: No clubbing or cyanosis. S/p right AKA.   TELEMETRY: Reviewed telemetry pt in NSR  ASSESSMENT AND PLAN: 68 yo with ischemic right lower leg s/p right AKA complicated by post-op E coli bacteremia and septic shock.  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  1. ID: E coli bacteremia post-op right AKA.  He has completed his ceftriaxone course.  Vitals stable and WBCs stable.  2. Neuro: Patient alert and oriented x 3 3. Acute systolic CHF: EF 84% on echo with RV dilation and moderate to severe AI and MR.  He has history of heavy ETOH.  [place] 12/31 with diffuse 3 vessel disease. Severe RCA and LAD lesions.  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  LVEDP and CVP were normal when measured in the cath lab.  He had a cardiac MRI done yesterday.  This showed EF 23% with subendocardial scar in the lateral wall (probably still viable).  This likely corresponds to the occluded OM.  The LAD and RCA territories appear viable.  - Continue current Lasix po, Coreg, lisinopril, spironolactone.  No BP room for titration. - If he doesn't go to SNF will need HHRN, PT and OT 4. PAD: s/p right AKA, may need revascularization on left.  Continue ASA and statin. 5. CAD: Diffuse 3 vessel disease, most significant lesions being 80-90% proximal LAD stenosis and serial 75% RCA stenoses. As above, think his cardiomyopathy is probably mixed (CAD and ETOH).  I do not think that he would be a good CABG candidate.  Could consider PCI to proximal LAD lesion, +/- RCA.  Anterior and inferior walls appear viable on MRI.  However,  I think ETOH plays a significant role in his cardiomyopathy so revascularization may not help much. Reviewed films with interventional cardiology and plan medical management for now.   6. Severe protein-calorie malnutrition - albumin 1.8  Stable from cards standpoint and can be discharged to SNF. Cardiac meds: ASA 81 Atorvastatin 40 mg daily. Coreg 6.25 BID Furosemide 40 mg daily Lisinopril 5 mg BID Spiro 12.5 mg daily  Well place for follow up in HF clinic 7-10 days post op.   Rande Brunt 04/07/2013 9:48 AM  Patient seen and examined with Junie Bame, NP. We discussed all aspects of the encounter. I agree with the assessment and plan as stated above. Remains stable from HF and CAD perspective. Volume status ok. BP too low to titrate HF meds currently. Plan for SNF. Agree with d/c meds as above. Given comorbidities, prognosis seems poor.   Daniel Bensimhon,MD 10:54 AM

## 2013-04-07 NOTE — Progress Notes (Signed)
Introduced self to pt.  A&0x3.  Call bell at reach.  Instructed to call for assist. As needed.  Verbalized understanding.  Will continue to monitor.  Karie Kirks, Therapist, sports.

## 2013-04-07 NOTE — Progress Notes (Signed)
Pt d/c'd to [facility] via ambulance.  Will continue to monitor.  Karie Kirks, Therapist, sports.

## 2013-04-07 NOTE — Progress Notes (Signed)
Noted some blood oozing from pt's penial area.  Site clean.  Pt denies pain.  Instructed by Shelby Mattocks that pt had bleeding after foley was d/c a day ago.  Dr.  Cruzita Lederer informed and instructed that he was informed.  No new  order given.  Will continue to monitor.  Karie Kirks, Therapist, sports.

## 2013-04-14 ENCOUNTER — Encounter (HOSPITAL_COMMUNITY): Payer: Medicaid Other

## 2013-04-28 ENCOUNTER — Encounter: Payer: Self-pay | Admitting: Vascular Surgery

## 2013-04-29 ENCOUNTER — Encounter: Payer: Self-pay | Admitting: Vascular Surgery

## 2013-04-29 ENCOUNTER — Ambulatory Visit (INDEPENDENT_AMBULATORY_CARE_PROVIDER_SITE_OTHER): Payer: Medicare Other | Admitting: Vascular Surgery

## 2013-04-29 VITALS — BP 113/60 | HR 77 | Ht 68.0 in | Wt 107.0 lb

## 2013-04-29 DIAGNOSIS — I70269 Atherosclerosis of native arteries of extremities with gangrene, unspecified extremity: Secondary | ICD-10-CM | POA: Insufficient documentation

## 2013-04-29 HISTORY — DX: Atherosclerosis of native arteries of extremities with gangrene, unspecified extremity: I70.269

## 2013-04-29 NOTE — Progress Notes (Signed)
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 His incision has healed quite nicely. He reports no pain. He reports that he is able to transfer from bed to wheelchair independently. He was at the staples removed today in follow up with [place] on an as-needed basis

## 2013-05-06 ENCOUNTER — Ambulatory Visit: Payer: Medicaid Other | Admitting: Internal Medicine

## 2013-05-14 ENCOUNTER — Inpatient Hospital Stay (HOSPITAL_COMMUNITY): Admission: RE | Admit: 2013-05-14 | Payer: Medicare Other | Source: Ambulatory Visit

## 2013-05-14 ENCOUNTER — Encounter (HOSPITAL_COMMUNITY): Payer: Self-pay

## 2013-08-13 ENCOUNTER — Inpatient Hospital Stay (HOSPITAL_COMMUNITY)
Admission: EM | Admit: 2013-08-13 | Discharge: 2013-08-22 | DRG: 539 | Disposition: A | Discharge status: Nursing Home (Includes ICF) | Payer: Medicare Other | Attending: Internal Medicine | Admitting: Internal Medicine

## 2013-08-13 ENCOUNTER — Emergency Department (HOSPITAL_COMMUNITY): Payer: Medicare Other

## 2013-08-13 ENCOUNTER — Encounter (HOSPITAL_COMMUNITY): Payer: Self-pay | Admitting: Emergency Medicine

## 2013-08-13 DIAGNOSIS — R6521 Severe sepsis with septic shock: Secondary | ICD-10-CM

## 2013-08-13 DIAGNOSIS — Z01810 Encounter for preprocedural cardiovascular examination: Secondary | ICD-10-CM

## 2013-08-13 DIAGNOSIS — L97509 Non-pressure chronic ulcer of other part of unspecified foot with unspecified severity: Secondary | ICD-10-CM | POA: Diagnosis present

## 2013-08-13 DIAGNOSIS — I5021 Acute systolic (congestive) heart failure: Secondary | ICD-10-CM

## 2013-08-13 DIAGNOSIS — D638 Anemia in other chronic diseases classified elsewhere: Secondary | ICD-10-CM | POA: Diagnosis present

## 2013-08-13 DIAGNOSIS — I7090 Unspecified atherosclerosis: Secondary | ICD-10-CM

## 2013-08-13 DIAGNOSIS — F17200 Nicotine dependence, unspecified, uncomplicated: Secondary | ICD-10-CM | POA: Diagnosis present

## 2013-08-13 DIAGNOSIS — I509 Heart failure, unspecified: Secondary | ICD-10-CM | POA: Diagnosis present

## 2013-08-13 DIAGNOSIS — F1010 Alcohol abuse, uncomplicated: Secondary | ICD-10-CM | POA: Diagnosis present

## 2013-08-13 DIAGNOSIS — I998 Other disorder of circulatory system: Secondary | ICD-10-CM

## 2013-08-13 DIAGNOSIS — E875 Hyperkalemia: Secondary | ICD-10-CM

## 2013-08-13 DIAGNOSIS — A419 Sepsis, unspecified organism: Secondary | ICD-10-CM

## 2013-08-13 DIAGNOSIS — Z7982 Long term (current) use of aspirin: Secondary | ICD-10-CM

## 2013-08-13 DIAGNOSIS — I739 Peripheral vascular disease, unspecified: Secondary | ICD-10-CM | POA: Diagnosis present

## 2013-08-13 DIAGNOSIS — L98499 Non-pressure chronic ulcer of skin of other sites with unspecified severity: Secondary | ICD-10-CM | POA: Diagnosis present

## 2013-08-13 DIAGNOSIS — M79672 Pain in left foot: Secondary | ICD-10-CM | POA: Diagnosis present

## 2013-08-13 DIAGNOSIS — Z9119 Patient's noncompliance with other medical treatment and regimen: Secondary | ICD-10-CM

## 2013-08-13 DIAGNOSIS — R600 Localized edema: Secondary | ICD-10-CM

## 2013-08-13 DIAGNOSIS — IMO0002 Reserved for concepts with insufficient information to code with codable children: Secondary | ICD-10-CM

## 2013-08-13 DIAGNOSIS — F10231 Alcohol dependence with withdrawal delirium: Secondary | ICD-10-CM

## 2013-08-13 DIAGNOSIS — Z91199 Patient's noncompliance with other medical treatment and regimen due to unspecified reason: Secondary | ICD-10-CM

## 2013-08-13 DIAGNOSIS — I70269 Atherosclerosis of native arteries of extremities with gangrene, unspecified extremity: Secondary | ICD-10-CM

## 2013-08-13 DIAGNOSIS — I080 Rheumatic disorders of both mitral and aortic valves: Secondary | ICD-10-CM | POA: Diagnosis present

## 2013-08-13 DIAGNOSIS — I7092 Chronic total occlusion of artery of the extremities: Secondary | ICD-10-CM | POA: Diagnosis present

## 2013-08-13 DIAGNOSIS — I2510 Atherosclerotic heart disease of native coronary artery without angina pectoris: Secondary | ICD-10-CM | POA: Diagnosis present

## 2013-08-13 DIAGNOSIS — F10931 Alcohol use, unspecified with withdrawal delirium: Secondary | ICD-10-CM

## 2013-08-13 DIAGNOSIS — R710 Precipitous drop in hematocrit: Secondary | ICD-10-CM

## 2013-08-13 DIAGNOSIS — M869 Osteomyelitis, unspecified: Principal | ICD-10-CM | POA: Diagnosis present

## 2013-08-13 DIAGNOSIS — I10 Essential (primary) hypertension: Secondary | ICD-10-CM | POA: Diagnosis present

## 2013-08-13 DIAGNOSIS — E43 Unspecified severe protein-calorie malnutrition: Secondary | ICD-10-CM | POA: Diagnosis present

## 2013-08-13 DIAGNOSIS — R195 Other fecal abnormalities: Secondary | ICD-10-CM

## 2013-08-13 DIAGNOSIS — F1210 Cannabis abuse, uncomplicated: Secondary | ICD-10-CM | POA: Diagnosis present

## 2013-08-13 DIAGNOSIS — J9601 Acute respiratory failure with hypoxia: Secondary | ICD-10-CM

## 2013-08-13 DIAGNOSIS — Z87891 Personal history of nicotine dependence: Secondary | ICD-10-CM

## 2013-08-13 DIAGNOSIS — Z79899 Other long term (current) drug therapy: Secondary | ICD-10-CM

## 2013-08-13 DIAGNOSIS — N179 Acute kidney failure, unspecified: Secondary | ICD-10-CM | POA: Diagnosis present

## 2013-08-13 DIAGNOSIS — I723 Aneurysm of iliac artery: Secondary | ICD-10-CM | POA: Diagnosis present

## 2013-08-13 DIAGNOSIS — R6520 Severe sepsis without septic shock: Secondary | ICD-10-CM

## 2013-08-13 DIAGNOSIS — I5022 Chronic systolic (congestive) heart failure: Secondary | ICD-10-CM | POA: Diagnosis present

## 2013-08-13 DIAGNOSIS — I2589 Other forms of chronic ischemic heart disease: Secondary | ICD-10-CM | POA: Diagnosis present

## 2013-08-13 DIAGNOSIS — S88119A Complete traumatic amputation at level between knee and ankle, unspecified lower leg, initial encounter: Secondary | ICD-10-CM

## 2013-08-13 DIAGNOSIS — S91301A Unspecified open wound, right foot, initial encounter: Secondary | ICD-10-CM

## 2013-08-13 LAB — CBC WITH DIFFERENTIAL/PLATELET
BASOS ABS: 0 10*3/uL (ref 0.0–0.1)
Basophils Relative: 0 % (ref 0–1)
Eosinophils Absolute: 0.3 10*3/uL (ref 0.0–0.7)
Eosinophils Relative: 4 % (ref 0–5)
HCT: 31.3 % — ABNORMAL LOW (ref 39.0–52.0)
Hemoglobin: 10.5 g/dL — ABNORMAL LOW (ref 13.0–17.0)
LYMPHS ABS: 2.6 10*3/uL (ref 0.7–4.0)
Lymphocytes Relative: 36 % (ref 12–46)
MCH: 27.9 pg (ref 26.0–34.0)
MCHC: 33.5 g/dL (ref 30.0–36.0)
MCV: 83 fL (ref 78.0–100.0)
Monocytes Absolute: 0.5 10*3/uL (ref 0.1–1.0)
Monocytes Relative: 6 % (ref 3–12)
NEUTROS ABS: 3.8 10*3/uL (ref 1.7–7.7)
NEUTROS PCT: 54 % (ref 43–77)
Platelets: 261 10*3/uL (ref 150–400)
RBC: 3.77 MIL/uL — AB (ref 4.22–5.81)
RDW: 14.6 % (ref 11.5–15.5)
WBC: 7.2 10*3/uL (ref 4.0–10.5)

## 2013-08-13 LAB — BASIC METABOLIC PANEL
BUN: 79 mg/dL — ABNORMAL HIGH (ref 6–23)
CO2: 19 meq/L (ref 19–32)
Calcium: 11.1 mg/dL — ABNORMAL HIGH (ref 8.4–10.5)
Chloride: 106 mEq/L (ref 96–112)
Creatinine, Ser: 1.85 mg/dL — ABNORMAL HIGH (ref 0.50–1.35)
GFR calc Af Amer: 41 mL/min — ABNORMAL LOW (ref >90)
GFR calc non Af Amer: 36 mL/min — ABNORMAL LOW (ref >90)
GLUCOSE: 102 mg/dL — AB (ref 70–99)
POTASSIUM: 5.7 meq/L — AB (ref 3.7–5.3)
Sodium: 138 mEq/L (ref 137–147)

## 2013-08-13 LAB — SEDIMENTATION RATE: Sed Rate: 67 mm/hr — ABNORMAL HIGH (ref 0–16)

## 2013-08-13 MED ORDER — SODIUM POLYSTYRENE SULFONATE 15 GM/60ML PO SUSP
15.0000 g | Freq: Once | ORAL | Status: AC
  Administered 2013-08-13: 15 g via ORAL
  Filled 2013-08-13: qty 60

## 2013-08-13 MED ORDER — SODIUM CHLORIDE 0.9 % IV SOLN
1000.0000 mL | Freq: Once | INTRAVENOUS | Status: AC
  Administered 2013-08-13: 1000 mL via INTRAVENOUS

## 2013-08-13 MED ORDER — SODIUM CHLORIDE 0.9 % IV SOLN
1000.0000 mL | INTRAVENOUS | Status: DC
  Administered 2013-08-13 – 2013-08-14 (×2): 1000 mL via INTRAVENOUS

## 2013-08-13 NOTE — H&P (Signed)
[facility] History and Physical  Adrick Kestler PYK:[id number] DOB: [birth year]    PCP:   No PCP Per Patient   Chief Complaint: Sent in by PCP for infected left great toe.  HPI: Rodney [name] is an 68 y.o. male with hx of prior ETOH abuse, tobacco abuse, ABK right amputation, hx of CHF, HTN, on ACE I and 2 diuretics, hx of PVD, resides in group home, brought to his physician for a "overgrown" left toe nail.  The nail was removed, and the physician said it was infected, and sent him to the ER for antibiotics.  Evaluaiton in the ER included a plain film of the left great toe, which queried an osteomyelitis of the distal left great toe.  Further, he was found to be in AKI, with Cr of 1.85, BUN of 79, and K of 5.7 mEq/L.  His EKG showed no peaked Ts nor widen QRS. Hospitalist was asked to admit him for AKI, likely from meds and volume depletion, along with possible osteomyelitis.   Rewiew of Systems:  Constitutional: Negative for malaise, fever and chills. No significant weight loss or weight gain Eyes: Negative for eye pain, redness and discharge, diplopia, visual changes, or flashes of light. ENMT: Negative for ear pain, hoarseness, nasal congestion, sinus pressure and sore throat. No headaches; tinnitus, drooling, or problem swallowing. Cardiovascular: Negative for chest pain, palpitations, diaphoresis, dyspnea and peripheral edema. ; No orthopnea, PND Respiratory: Negative for cough, hemoptysis, wheezing and stridor. No pleuritic chestpain. Gastrointestinal: Negative for nausea, vomiting, diarrhea, constipation, abdominal pain, melena, blood in stool, hematemesis, jaundice and rectal bleeding.    Genitourinary: Negative for frequency, dysuria, incontinence,flank pain and hematuria; Musculoskeletal: Negative for back pain and neck pain. Negative for swelling and trauma.;  Skin: . Negative for pruritus, rash, abrasions, bruising and skin lesion.; ulcerations Neuro: Negative for headache,  lightheadedness and neck stiffness. Negative for weakness, altered level of consciousness , altered mental status, extremity weakness, burning feet, involuntary movement, seizure and syncope.  Psych: negative for anxiety, depression, insomnia, tearfulness, panic attacks, hallucinations, paranoia, suicidal or homicidal ideation    Past Medical History  Diagnosis Date  . HTN (hypertension)   . Smoking hx 03/25/2013  . Alcohol abuse 03/25/2013  . CHF (congestive heart failure) 03/25/2013    Past Surgical History  Procedure Laterality Date  . Above knee leg amputation Right   . Amputation Right 03/26/2013    Procedure: AMPUTATION ABOVE KNEE;  Surgeon: Rosetta Posner, MD;  Location: [facility] OR;  Service: Vascular;  Laterality: Right;    Medications:  HOME MEDS: Prior to Admission medications   Medication Sig Start Date End Date Taking? Authorizing Provider  aspirin EC 81 MG tablet Take 81 mg by mouth daily.   Yes Historical Provider, MD  atorvastatin (LIPITOR) 40 MG tablet Take 40 mg by mouth every evening. 04/07/13  Yes [name], MD  carvedilol (COREG) 6.25 MG tablet Take 1 tablet (6.25 mg total) by mouth 2 (two) times daily with a meal. 04/07/13  Yes [name], MD  escitalopram (LEXAPRO) 5 MG tablet Take 5 mg by mouth daily. 07/31/13  Yes Historical Provider, MD  folic acid (FOLVITE) 1 MG tablet Take 1 tablet (1 mg total) by mouth daily. 04/07/13  Yes [name], MD  furosemide (LASIX) 40 MG tablet Take 1 tablet (40 mg total) by mouth daily. 04/07/13  Yes [name], MD  lisinopril (PRINIVIL,ZESTRIL) 5 MG tablet Take 5 mg by mouth daily. 04/07/13  Yes Costin  Karlyne Greenspan, MD  pantoprazole (PROTONIX) 40 MG tablet Take 1 tablet (40 mg total) by mouth daily. 04/07/13  Yes [name], MD  spironolactone (ALDACTONE) 25 MG tablet Take 25 mg by mouth daily. 04/07/13  Yes [name], MD  vitamin C (ASCORBIC ACID) 500 MG tablet Take 500 mg by mouth daily.   Yes Historical Provider,  MD  zinc sulfate 220 MG capsule Take 220 mg by mouth daily.   Yes Historical Provider, MD     Allergies:  No Known Allergies  Social History:   reports that he has been smoking Cigarettes.  He has been smoking about 0.00 packs per day. He does not have any smokeless tobacco history on file. He reports that he drinks alcohol. He reports that he uses illicit drugs (Marijuana).  Family History: No family history on file.   Physical Exam: Filed Vitals:   08/13/13 2058  BP: 120/61  Pulse: 75  Temp: 97.9 F (36.6 C)  TempSrc: Oral  Resp: 18  SpO2: 100%   Blood pressure 120/61, pulse 75, temperature 97.9 F (36.6 C), temperature source Oral, resp. rate 18, SpO2 100.00%.  GEN:  Pleasant  patient lying in the stretcher in no acute distress; cooperative with exam. PSYCH:  alert and oriented x4; does not appear anxious or depressed; affect is appropriate. HEENT: Mucous membranes pink and anicteric; PERRLA; EOM intact; no cervical lymphadenopathy nor thyromegaly or carotid bruit; no JVD; There were no stridor. Neck is very supple. Breasts:: Not examined CHEST WALL: No tenderness CHEST: Normal respiration, clear to auscultation bilaterally.  HEART: Regular rate and rhythm.  There are no murmur, rub, or gallops.   BACK: No kyphosis or scoliosis; no CVA tenderness ABDOMEN: soft and non-tender; no masses, no organomegaly, normal abdominal bowel sounds; no pannus; no intertriginous candida. There is no rebound and no distention. Rectal Exam: Not done EXTREMITIES: No bone or joint deformity; age-appropriate arthropathy of the hands and knees; no edema; no ulcerations.  There is no calf tenderness.  Right AKA.  Left great toe has been removed. No didscharge or necrosis, no foul smelling odor. Genitalia: not examined PULSES: 2+ and symmetric SKIN: Normal hydration no rash or ulceration CNS: Cranial nerves 2-12 grossly intact no focal lateralizing neurologic deficit.  Speech is fluent; uvula  elevated with phonation, facial symmetry and tongue midline. DTR are normal bilaterally, cerebella exam is intact, barbinski is negative and strengths are equaled bilaterally.  No sensory loss.   Labs on Admission:  Basic Metabolic Panel:  Recent Labs Lab 08/13/13 2002  NA 138  K 5.7*  CL 106  CO2 19  GLUCOSE 102*  BUN 79*  CREATININE 1.85*  CALCIUM 11.1*   CBC:  Recent Labs Lab 08/13/13 2002  WBC 7.2  NEUTROABS 3.8  HGB 10.5*  HCT 31.3*  MCV 83.0  PLT 261   Cardiac Enzymes: No results found for this basename: CKTOTAL, CKMB, CKMBINDEX, TROPONINI,  in the last 168 hours  CBG: No results found for this basename: GLUCAP,  in the last 168 hours   Radiological Exams on Admission: Dg Toe Great Left  08/13/2013   CLINICAL DATA:  Possible infection  EXAM: LEFT GREAT TOE  COMPARISON:  None.  FINDINGS: Three views of left great toe submitted. There is cortical irregularity and subtle erosion at the tip of distal phalanx suspicious for osteomyelitis. . Clinical correlation is necessary. Further correlation with MRI could be performed. No acute fracture or subluxation.  IMPRESSION: There is cortical irregularity and  subtle erosion at the tip of distal phalanx suspicious for osteomyelitis. . Clinical correlation is necessary. Further correlation with MRI could be performed.   Electronically Signed   By: Lahoma Crocker M.D.   On: 08/13/2013 20:17    EKG: Independently reviewed. No widen QRS, no acute ST T changes.   Assessment/Plan Present on Admission:  . Osteomyelitis of toe of left foot . AKI (acute kidney injury) . CHF (congestive heart failure) . Severe protein-calorie malnutrition   PLAN:  Will admit him for AKI.  I think it is both medication induced along with pre renal azotemia.  Will hold both diuretics (Lasix and Spironolactone), and hold ACE-I.  I suspect his K will come down as well.  Will give some IVF, but carefully as he has had hx of CHF.  For his possible  osteomyelitis, will give empiric VAN/ZOSYN, and obtain MRI without contrast of his left foot with special attention to the great toe.  He is stable, full code, and will be admitted to [facility] service.   Other plans as per orders.  Code Status: FULL CODE.   Orvan Falconer, MD. [facility] Pager [phone] 7pm to 7am.  08/13/2013, 10:16 PM

## 2013-08-13 NOTE — ED Provider Notes (Addendum)
CSN: [id number]     Arrival date & time 08/13/13  1653 History  This chart was scribed for Janice Norrie, MD by Ladene Artist, ED Scribe. The patient was seen in room APA09/APA09. Patient's care was started at 7:41 PM.    Chief Complaint  Patient presents with  . Wound Infection    The history is provided by the patient. No language interpreter was used.   HPI Comments: Rodney [name] is a 68 y.o. male who presents to the Emergency Department complaining of L great big toenail removal today. Patient states the toenail had gotten very long and was thick. He states it started bothering him 2- 3 days ago. Pt states that he had his toenail removed earlier today at [facility] by Dr. Serafina Royals. He states that Dr. Patsey Berthold told him to come to the ED for infection. He denies fever, chills. Her note states she could see the tip of his bone after she removed the nail and was concerned he had osteomyelitis. Pt states his toe hurts now, but not before.  Pt smokes 1 ppd. He used to drink a fifth a day but denies alcohol use since 03/2013. Pt is a resident at [facility]. One of the staff is here with patient.   PCP Dr Serafina Royals in [facility]  Past Medical History  Diagnosis Date  . HTN (hypertension)   . Smoking hx 03/25/2013  . Alcohol abuse 03/25/2013  . CHF (congestive heart failure) 03/25/2013   Past Surgical History  Procedure Laterality Date  . Above knee leg amputation Right   . Amputation Right 03/26/2013    Procedure: AMPUTATION ABOVE KNEE;  Surgeon: Rosetta Posner, MD;  Location: [facility];  Service: Vascular;  Laterality: Right;   No family history on file. History  Substance Use Topics  . Smoking status: Current Some Day Smoker    Types: Cigarettes  . Smokeless tobacco: Not on file  . Alcohol Use: Yes     Comment: everyday  lives in rest home  Review of Systems  Constitutional: Negative for fever and chills.  Skin: Positive for wound.  All other  systems reviewed and are negative.   Allergies  Review of patient's allergies indicates no known allergies.  Home Medications   Prior to Admission medications   Medication Sig Start Date End Date Taking? Authorizing Provider  aspirin 81 MG chewable tablet Chew 81-162 mg by mouth daily as needed for moderate pain.    Historical Provider, MD  atorvastatin (LIPITOR) 40 MG tablet Take 1 tablet (40 mg total) by mouth daily at 6 PM. 04/07/13   [name], MD  carvedilol (COREG) 6.25 MG tablet Take 1 tablet (6.25 mg total) by mouth 2 (two) times daily with a meal. 04/07/13   [name], MD  feeding supplement, ENSURE COMPLETE, (ENSURE COMPLETE) LIQD Take 237 mLs by mouth 2 (two) times daily between meals. 04/07/13   [name], MD  folic acid (FOLVITE) 1 MG tablet Take 1 tablet (1 mg total) by mouth daily. 04/07/13   [name], MD  furosemide (LASIX) 40 MG tablet Take 1 tablet (40 mg total) by mouth daily. 04/07/13   [name], MD  lisinopril (PRINIVIL,ZESTRIL) 5 MG tablet Take 1 tablet (5 mg total) by mouth 2 (two) times daily. 04/07/13   [name], MD  LORazepam (ATIVAN) 1 MG tablet Take 1 tablet (1 mg total) by mouth every 8 (eight) hours as needed for  anxiety. 04/07/13   [name], MD  oxyCODONE (OXY IR/ROXICODONE) 5 MG immediate release tablet Take 1 tablet (5 mg total) by mouth every 4 (four) hours as needed for moderate pain. 04/07/13   [name], MD  pantoprazole (PROTONIX) 40 MG tablet Take 1 tablet (40 mg total) by mouth daily. 04/07/13   [name], MD  spironolactone (ALDACTONE) 25 MG tablet Take 0.5 tablets (12.5 mg total) by mouth daily. 04/07/13   [name], MD  thiamine 100 MG tablet Place 1 tablet (100 mg total) into feeding tube daily. 04/07/13   [name], MD   BP 120/61  Pulse 75  Temp(Src) 97.9 F (36.6 C) (Oral)  Resp 18  SpO2 100%  Vital signs normal   Physical Exam  Nursing note and vitals reviewed. Constitutional:  He is oriented to person, place, and time. He appears well-developed and well-nourished.  Non-toxic appearance. He does not appear ill. No distress.  HENT:  Head: Normocephalic and atraumatic.  Right Ear: External ear normal.  Left Ear: External ear normal.  Nose: Nose normal. No mucosal edema or rhinorrhea.  Mouth/Throat: Oropharynx is clear and moist and mucous membranes are normal. No dental abscesses or uvula swelling.  Eyes: Conjunctivae and EOM are normal. Pupils are equal, round, and reactive to light.  Neck: Normal range of motion and full passive range of motion without pain. Neck supple.  Cardiovascular: Normal rate, regular rhythm and normal heart sounds.  Exam reveals no gallop and no friction rub.   No murmur heard. Pulmonary/Chest: Effort normal and breath sounds normal. No respiratory distress. He has no wheezes. He has no rhonchi. He has no rales. He exhibits no tenderness and no crepitus.  Abdominal: Soft. Normal appearance and bowel sounds are normal. He exhibits no distension. There is no tenderness. There is no rebound and no guarding.  Musculoskeletal: Normal range of motion. He exhibits no edema and no tenderness.  Moves all extremities well.  Status post AKA on R L foot: L big toenail is gone, clean nailbed, no purulent drainage, minimal bleeding, no warmth, skin is hyperpigmented but looks chronic   Neurological: He is alert and oriented to person, place, and time. He has normal strength. No cranial nerve deficit.  Skin: Skin is warm, dry and intact. No rash noted. No erythema. No pallor.  Psychiatric: He has a normal mood and affect. His speech is normal and behavior is normal. His mood appears not anxious.    ED Course  Procedures (including critical care time) DIAGNOSTIC STUDIES: Medications  0.9 %  sodium chloride infusion (1,000 mLs Intravenous New Bag/Given 08/13/13 2139)    Followed by  0.9 %  sodium chloride infusion (not administered)  sodium polystyrene  (KAYEXALATE) 15 GM/60ML suspension 15 g (not administered)     COORDINATION OF CARE: 7:50 PM Discussed treatment plan with pt at bedside and pt agreed to plan.  Patient was not started on antibiotics because cultures needed to be obtained.  PT not given IV meds for his hyperkalemia, his EKG is normal. He was given oral kayexalte  21:43 Dr Marin Comment will see patient and admit.   Labs Review Results for orders placed during the hospital encounter of 08/13/13  CBC WITH DIFFERENTIAL      Result Value Ref Range   WBC 7.2  4.0 - 10.5 K/uL   RBC 3.77 (*) 4.22 - 5.81 MIL/uL   Hemoglobin 10.5 (*) 13.0 - 17.0 g/dL   HCT 31.3 (*) 39.0 -  52.0 %   MCV 83.0  78.0 - 100.0 fL   MCH 27.9  26.0 - 34.0 pg   MCHC 33.5  30.0 - 36.0 g/dL   RDW 14.6  11.5 - 15.5 %   Platelets 261  150 - 400 K/uL   Neutrophils Relative % 54  43 - 77 %   Neutro Abs 3.8  1.7 - 7.7 K/uL   Lymphocytes Relative 36  12 - 46 %   Lymphs Abs 2.6  0.7 - 4.0 K/uL   Monocytes Relative 6  3 - 12 %   Monocytes Absolute 0.5  0.1 - 1.0 K/uL   Eosinophils Relative 4  0 - 5 %   Eosinophils Absolute 0.3  0.0 - 0.7 K/uL   Basophils Relative 0  0 - 1 %   Basophils Absolute 0.0  0.0 - 0.1 K/uL  BASIC METABOLIC PANEL      Result Value Ref Range   Sodium 138  137 - 147 mEq/L   Potassium 5.7 (*) 3.7 - 5.3 mEq/L   Chloride 106  96 - 112 mEq/L   CO2 19  19 - 32 mEq/L   Glucose, Bld 102 (*) 70 - 99 mg/dL   BUN 79 (*) 6 - 23 mg/dL   Creatinine, Ser 1.85 (*) 0.50 - 1.35 mg/dL   Calcium 11.1 (*) 8.4 - 10.5 mg/dL   GFR calc non Af Amer 36 (*) >90 mL/min   GFR calc Af Amer 41 (*) >90 mL/min   Laboratory interpretation all normal except hyperkalemia, new renal insuffic, stable anemia   Imaging Review Dg Toe Great Left  08/13/2013   CLINICAL DATA:  Possible infection  EXAM: LEFT GREAT TOE  COMPARISON:  None.  FINDINGS: Three views of left great toe submitted. There is cortical irregularity and subtle erosion at the tip of distal phalanx  suspicious for osteomyelitis. . Clinical correlation is necessary. Further correlation with MRI could be performed. No acute fracture or subluxation.  IMPRESSION: There is cortical irregularity and subtle erosion at the tip of distal phalanx suspicious for osteomyelitis. . Clinical correlation is necessary. Further correlation with MRI could be performed.   Electronically Signed   By: Lahoma Crocker M.D.   On: 08/13/2013 20:17     EKG Interpretation   Date/Time:  Wednesday Aug 13 2013 21:42:43 EDT Ventricular Rate:  75 PR Interval:  152 QRS Duration: 92 QT Interval:  360 QTC Calculation: 402 R Axis:   62 Text Interpretation:  Sinus rhythm Abnormal T, consider ischemia, diffuse  leads Minimal ST elevation, anterior leads Baseline wander in lead(s) V2  No significant change since last tracing 03 Apr 2013 Confirmed by [facility]   MD-I, Danylle Ouk ([id number]) on 08/13/2013 10:05:53 PM      MDM   Final diagnoses:  Osteomyelitis of toe of left foot  Acute renal failure (ARF)  Hyperkalemia    Plan admission  I personally performed the services described in this documentation, which was scribed in my presence. The recorded information has been reviewed and considered.  Rolland Porter, MD, FACEP    Janice Norrie, MD 08/13/13 [place], MD 08/14/13 [phone]

## 2013-08-13 NOTE — ED Notes (Signed)
Pt was told to come to ED by [facility] after having toe nail removed today. "We were told to come here and yall were going to keep him for two weeks" Caregiver states clinic said toe is "infected real bad",

## 2013-08-14 ENCOUNTER — Observation Stay (HOSPITAL_COMMUNITY): Payer: Medicare Other

## 2013-08-14 ENCOUNTER — Encounter (HOSPITAL_COMMUNITY): Payer: Self-pay | Admitting: *Deleted

## 2013-08-14 DIAGNOSIS — I509 Heart failure, unspecified: Secondary | ICD-10-CM

## 2013-08-14 DIAGNOSIS — I5022 Chronic systolic (congestive) heart failure: Secondary | ICD-10-CM | POA: Diagnosis present

## 2013-08-14 DIAGNOSIS — M79672 Pain in left foot: Secondary | ICD-10-CM | POA: Diagnosis present

## 2013-08-14 LAB — BASIC METABOLIC PANEL
BUN: 65 mg/dL — ABNORMAL HIGH (ref 6–23)
CO2: 17 meq/L — AB (ref 19–32)
Calcium: 9.5 mg/dL (ref 8.4–10.5)
Chloride: 110 mEq/L (ref 96–112)
Creatinine, Ser: 1.59 mg/dL — ABNORMAL HIGH (ref 0.50–1.35)
GFR calc Af Amer: 49 mL/min — ABNORMAL LOW (ref >90)
GFR calc non Af Amer: 43 mL/min — ABNORMAL LOW (ref >90)
Glucose, Bld: 117 mg/dL — ABNORMAL HIGH (ref 70–99)
Potassium: 4.9 mEq/L (ref 3.7–5.3)
SODIUM: 139 meq/L (ref 137–147)

## 2013-08-14 LAB — MRSA PCR SCREENING: MRSA by PCR: NEGATIVE

## 2013-08-14 MED ORDER — PIPERACILLIN-TAZOBACTAM 3.375 G IVPB 30 MIN: 3.3750 g | Freq: Three times a day (TID) | INTRAVENOUS | Status: DC

## 2013-08-14 MED ORDER — VANCOMYCIN HCL 500 MG IV SOLR
INTRAVENOUS | Status: AC
  Filled 2013-08-14: qty 500

## 2013-08-14 MED ORDER — PIPERACILLIN-TAZOBACTAM 3.375 G IVPB
3.3750 g | Freq: Three times a day (TID) | INTRAVENOUS | Status: DC
  Administered 2013-08-14 – 2013-08-16 (×8): 3.375 g via INTRAVENOUS
  Filled 2013-08-14 (×17): qty 50

## 2013-08-14 MED ORDER — CARVEDILOL 6.25 MG PO TABS
6.2500 mg | ORAL_TABLET | Freq: Two times a day (BID) | ORAL | Status: DC
  Administered 2013-08-14 – 2013-08-22 (×17): 6.25 mg via ORAL
  Filled 2013-08-14: qty 1
  Filled 2013-08-14: qty 2
  Filled 2013-08-14 (×5): qty 1
  Filled 2013-08-14: qty 2
  Filled 2013-08-14 (×4): qty 1
  Filled 2013-08-14: qty 2
  Filled 2013-08-14 (×5): qty 1
  Filled 2013-08-14: qty 2
  Filled 2013-08-14 (×2): qty 1

## 2013-08-14 MED ORDER — VITAMIN C 500 MG PO TABS
500.0000 mg | ORAL_TABLET | Freq: Every day | ORAL | Status: DC
  Administered 2013-08-14 – 2013-08-22 (×9): 500 mg via ORAL
  Filled 2013-08-14 (×10): qty 1

## 2013-08-14 MED ORDER — PIPERACILLIN-TAZOBACTAM 3.375 G IVPB
INTRAVENOUS | Status: AC
  Filled 2013-08-14: qty 50

## 2013-08-14 MED ORDER — VANCOMYCIN HCL 500 MG IV SOLR
500.0000 mg | INTRAVENOUS | Status: DC
  Administered 2013-08-14: 500 mg via INTRAVENOUS
  Filled 2013-08-14 (×4): qty 500

## 2013-08-14 MED ORDER — CITALOPRAM HYDROBROMIDE 20 MG PO TABS
20.0000 mg | ORAL_TABLET | Freq: Every day | ORAL | Status: DC
  Administered 2013-08-14 – 2013-08-22 (×9): 20 mg via ORAL
  Filled 2013-08-14 (×10): qty 1

## 2013-08-14 MED ORDER — ACETAMINOPHEN 325 MG PO TABS
650.0000 mg | ORAL_TABLET | Freq: Four times a day (QID) | ORAL | Status: DC | PRN
Start: 2013-08-14 — End: 2013-08-22
  Administered 2013-08-21 – 2013-08-22 (×2): 650 mg via ORAL
  Filled 2013-08-14 (×2): qty 2

## 2013-08-14 MED ORDER — HEPARIN SODIUM (PORCINE) 5000 UNIT/ML IJ SOLN
5000.0000 [IU] | Freq: Three times a day (TID) | INTRAMUSCULAR | Status: DC
  Administered 2013-08-14 – 2013-08-17 (×11): 5000 [IU] via SUBCUTANEOUS
  Filled 2013-08-14 (×15): qty 1

## 2013-08-14 MED ORDER — ATORVASTATIN CALCIUM 40 MG PO TABS
40.0000 mg | ORAL_TABLET | Freq: Every evening | ORAL | Status: DC
  Administered 2013-08-14 – 2013-08-21 (×8): 40 mg via ORAL
  Filled 2013-08-14 (×10): qty 1

## 2013-08-14 MED ORDER — FOLIC ACID 1 MG PO TABS
1.0000 mg | ORAL_TABLET | Freq: Every day | ORAL | Status: DC
  Administered 2013-08-14 – 2013-08-22 (×9): 1 mg via ORAL
  Filled 2013-08-14 (×10): qty 1

## 2013-08-14 MED ORDER — SODIUM CHLORIDE 0.9 % IV SOLN: 1000.0000 mL | INTRAVENOUS | Status: AC

## 2013-08-14 MED ORDER — ASPIRIN EC 81 MG PO TBEC
81.0000 mg | DELAYED_RELEASE_TABLET | Freq: Every day | ORAL | Status: DC
  Administered 2013-08-14 – 2013-08-22 (×8): 81 mg via ORAL
  Filled 2013-08-14 (×9): qty 1

## 2013-08-14 MED ORDER — VANCOMYCIN HCL 500 MG IV SOLR
500.0000 mg | Freq: Once | INTRAVENOUS | Status: AC
  Administered 2013-08-14: 500 mg via INTRAVENOUS
  Filled 2013-08-14: qty 500

## 2013-08-14 MED ORDER — ZINC SULFATE 220 (50 ZN) MG PO CAPS
220.0000 mg | ORAL_CAPSULE | Freq: Every day | ORAL | Status: DC
  Administered 2013-08-14 – 2013-08-22 (×9): 220 mg via ORAL
  Filled 2013-08-14 (×10): qty 1

## 2013-08-14 NOTE — Progress Notes (Signed)
UR completed 

## 2013-08-14 NOTE — Progress Notes (Signed)
ANTIBIOTIC CONSULT NOTE - FOLLOW UP  Pharmacy Consult for Vancomycin and Zosyn  Indication: cellulitis/osteomyelitis  No Known Allergies  Patient Measurements: Height: 5\' 3"  (160 cm) Weight: 104 lb 1.6 oz (47.219 kg) IBW/kg (Calculated) : 56.9  Vital Signs: Temp: 98.2 F (36.8 C) (05/14 0636) Temp src: Oral (05/14 0636) BP: 103/49 mmHg (05/14 0636) Pulse Rate: 76 (05/14 0636) Intake/Output from previous day: 05/13 0701 - 05/14 0700 In: 1095.8 [I.V.:945.8; IV Piggyback:150] Out: -  Intake/Output from this shift: Total I/O In: -  Out: 200 [Urine:200]  Labs:  Recent Labs  08/13/13 2002 08/14/13 0514  WBC 7.2  --   HGB 10.5*  --   PLT 261  --   CREATININE 1.85* 1.59*   Estimated Creatinine Clearance: 29.3 ml/min (by C-G formula based on Cr of 1.59). No results found for this basename: VANCOTROUGH, VANCOPEAK, VANCORANDOM, GENTTROUGH, GENTPEAK, GENTRANDOM, TOBRATROUGH, TOBRAPEAK, TOBRARND, AMIKACINPEAK, AMIKACINTROU, AMIKACIN,  in the last 72 hours   Past Medical History  Diagnosis Date  . HTN (hypertension)   . Smoking hx 03/25/2013  . Alcohol abuse 03/25/2013  . CHF (congestive heart failure) 03/25/2013   Microbiology: Recent Results (from the past 720 hour(s))  MRSA PCR SCREENING     Status: None   Collection Time    08/14/13  3:40 AM      Result Value Ref Range Status   MRSA by PCR NEGATIVE  NEGATIVE Final   Comment:            The GeneXpert MRSA Assay (FDA     approved for NASAL specimens     only), is one component of a     comprehensive MRSA colonization     surveillance program. It is not     intended to diagnose MRSA     infection nor to guide or     monitor treatment for     MRSA infections.   Medications Prior to Admission  Medication Sig Dispense Refill  . aspirin EC 81 MG tablet Take 81 mg by mouth daily.      Marland Kitchen atorvastatin (LIPITOR) 40 MG tablet Take 40 mg by mouth every evening.      . carvedilol (COREG) 6.25 MG tablet Take 1 tablet  (6.25 mg total) by mouth 2 (two) times daily with a meal.      . escitalopram (LEXAPRO) 5 MG tablet Take 5 mg by mouth daily.      . folic acid (FOLVITE) 1 MG tablet Take 1 tablet (1 mg total) by mouth daily.      . furosemide (LASIX) 40 MG tablet Take 1 tablet (40 mg total) by mouth daily.  30 tablet    . lisinopril (PRINIVIL,ZESTRIL) 5 MG tablet Take 5 mg by mouth daily.      . pantoprazole (PROTONIX) 40 MG tablet Take 1 tablet (40 mg total) by mouth daily.      Marland Kitchen spironolactone (ALDACTONE) 25 MG tablet Take 25 mg by mouth daily.      . vitamin C (ASCORBIC ACID) 500 MG tablet Take 500 mg by mouth daily.      Marland Kitchen zinc sulfate 220 MG capsule Take 220 mg by mouth daily.       Anti-infectives   Start     Dose/Rate Route Frequency Ordered Stop   08/14/13 1800  vancomycin (VANCOCIN) 500 mg in sodium chloride 0.9 % 100 mL IVPB     500 mg 100 mL/hr over 60 Minutes Intravenous Every 24 hours 08/14/13 0250  08/14/13 0600  piperacillin-tazobactam (ZOSYN) IVPB 3.375 g  Status:  Discontinued     3.375 g 100 mL/hr over 30 Minutes Intravenous 3 times per day 08/14/13 0239 08/14/13 0240   08/14/13 0400  piperacillin-tazobactam (ZOSYN) IVPB 3.375 g     3.375 g 12.5 mL/hr over 240 Minutes Intravenous Every 8 hours 08/14/13 0239     08/14/13 0300  vancomycin (VANCOCIN) 500 mg in sodium chloride 0.9 % 100 mL IVPB     500 mg 100 mL/hr over 60 Minutes Intravenous  Once 08/14/13 0250 08/14/13 0434   08/14/13 0232  piperacillin-tazobactam (ZOSYN) IVPB 3.375 g  Status:  Discontinued     3.375 g 100 mL/hr over 30 Minutes Intravenous 3 times per day 08/14/13 0232 08/14/13 0237     Assessment: 32 yr small male with right AKA leg and osteomyelitis of left great toe to be treated with Zosyn and Vancomycin.  Renal function improving.   Goal of Therapy:  Vancomycin trough level 15-20 mcg/ml Eradicate infection.   Plan:  Continue Vancomycin 500mg  IV every 24 hours. Continue Zosyn 3.375gm IV every 8  hours. Measure antibiotic drug levels at steady state Follow up culture results  Pricilla Larsson 08/14/2013,8:05 AM

## 2013-08-14 NOTE — Progress Notes (Signed)
PROGRESS NOTE  Rodney [name] NIO:[id number] DOB: Feb 01, 1946 DOA: 08/13/2013 PCP: Rafael Bihari, MD  Summary: 68 year old man with history of peripheral vascular disease was seen by his primary care physician 5/14 for "overgrown" left first toenail. The nail was removed, his physician was concerned for osteomyelitis and so sent the patient to the emergency department where plain films suggested osteomyelitis.  Assessment/Plan: 1. Left great toe terminal phalanx osteomyelitis. 2. Acute renal failure with associated hyperkalemia. Likely prerenal azotemia complicated by diuretics and ACE inhibitor. Hyperkalemia has resolved. Renal functions improving. Adequate urine output noted. 3. 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  4. Severe peripheral vascular disease. Left lower extremity ABI 0.57 03/2013. Vascular surgery raise the possibility of intervention in the future. 5. Chronic systolic congestive heart failure, moderate to severe aortic insufficiency, felt to be mixed in etiology (coronary artery disease and alcohol). Clinically compensated. Underwent cardiac catheterization 03/2013 which demonstrated diffuse three-vessel disease. Cardiology recommended medical management.  6. Tobacco dependence  7. History of alcohol abuse.   The patient already started on broad-spectrum IV antibiotics. Hemodynamics remain stable and he appears clinically well. Last hospitalization has been reviewed when the patient developed sepsis from acute gangrene of the right leg resulting in AKA. His left foot is warm, dry with grossly normal range of movement and sensation without evidence of acute gangrene. No pain. Therefore at this point continue antibiotics, obtain surgical consultation in the morning.  Continue IV fluids, Foley urine output, repeat basic metabolic panel the morning.  Code Status: full code DVT prophylaxis: heparin Family  Communication: none present Disposition Plan: pending  Murray Hodgkins, MD  [facility]  Pager [phone] If 7PM-7AM, please contact night-coverage at [email or web address], password [facility] 08/14/2013, 2:45 PM  LOS: 1 day   Consultants:  General surgery  Procedures:    Antibiotics:  Zosyn 5/14 >>   Vancomycin 5/14  HPI/Subjective: No complaints. No pain in his left foot or toes. Normal sensation toes. Eating well. No other complaints. He reports trying to burn off a large toenail with a lighter.  Objective: Filed Vitals:   08/13/13 2230 08/14/13 0030 08/14/13 0221 08/14/13 0636  BP: 93/51 97/51 108/72 103/49  Pulse: 80 89  76  Temp:   97.5 F (36.4 C) 98.2 F (36.8 C)  TempSrc:   Oral Oral  Resp: 28 13 20 20   Height:   5\' 3"  (1.6 m)   Weight:   47.219 kg (104 lb 1.6 oz)   SpO2: 100% 95% 100% 99%    Intake/Output Summary (Last 24 hours) at 08/14/13 1445 Last data filed at 08/14/13 1229  Gross per 24 hour  Intake 1145.83 ml  Output    700 ml  Net 445.83 ml     Filed Weights   08/14/13 0221  Weight: 47.219 kg (104 lb 1.6 oz)    Exam:   Afebrile, vital signs are stable. No hypoxia.  Gen. Appears calm and comfortable. Speech fluent and clear. Well-appearing.  Cardiovascular. Regular rate and rhythm. No murmur, rub or gallop. No lower extremity edema on the left.  Respiratory. Clear to auscultation bilaterally. No wheezes, rales or rhonchi. Normal respiratory effort.  Left lower extremity. Great toe with surgically absent nail. Small open area with some serous drainage near the tip. Nontender. Some discoloration of the skin however perfusion to the distal portion of the toe appears intact. Remainder of the toes appear perfused. Sensation in all toes grossly normal. Excellent range of movement. Foot and toes  warm and dry. Do not appreciate palpable DP (consistent with previous exams)  Data Reviewed:  Hyperkalemia has resolved.  BUN, creatinine trending  downwards, today 65/1.59.   MRI reveals osteomyelitis the tip of the terminal phalanx of the great toe.  Scheduled Meds: . aspirin EC  81 mg Oral Daily  . atorvastatin  40 mg Oral QPM  . carvedilol  6.25 mg Oral BID WC  . citalopram  20 mg Oral Daily  . folic acid  1 mg Oral Daily  . piperacillin-tazobactam (ZOSYN)  IV  3.375 g Intravenous Q8H  . vancomycin  500 mg Intravenous Q24H  . vitamin C  500 mg Oral Daily  . zinc sulfate  220 mg Oral Daily   Continuous Infusions: . sodium chloride 1,000 mL (08/14/13 0858)    Principal Problem:   Osteomyelitis of toe of left foot Active Problems:   CHF (congestive heart failure)   Smoking hx   Severe protein-calorie malnutrition   AKI (acute kidney injury)   Time spent 35  minutes

## 2013-08-14 NOTE — Care Management Note (Addendum)
    Page 1 of 1   08/21/2013     3:34:58 PM CARE MANAGEMENT NOTE 08/21/2013  Patient:  Rodney [name], Rodney [name]   Account Number:  [id number]  Date Initiated:  08/14/2013  Documentation initiated by:  Theophilus Kinds  Subjective/Objective Assessment:   Pt admitted from [facility] with osteomylitis. Pt may need higher level of care if pt requires any surgery or IV AB at discharge.     Action/Plan:   CSW to arrange discharge to facility when medically stable.   Anticipated DC Date:  08/22/2013   Anticipated DC Plan:  ASSISTED LIVING / REST HOME  In-house referral  Clinical Social Worker      DC Planning Services  CM consult      Choice offered to / List presented to:             Status of service:  In process, will continue to follow Medicare Important Message given?   (If response is "NO", the following Medicare IM given date fields will be blank) Date Medicare IM given:   Date Additional Medicare IM given:    Discharge Disposition:    Per UR Regulation:    If discussed at Long Length of Stay Meetings, dates discussed:   08/21/2013    Comments:  08/21/13 Ellan Lambert, RN, BSN [phone] Pt awaiting CTA a/p and vasc studies; cont IV Vanc and Zosyn.  Cousin to arrive today to assist with decision making.  08/15/13 [place], RN BSN CM Pt to transfer to [facility] for furthur treatment and surgery. CM on receiving unit will monitor for discharge planning needs.  08/14/13 [place], RN BSN CM

## 2013-08-14 NOTE — Progress Notes (Signed)
ANTIBIOTIC CONSULT NOTE - INITIAL  Pharmacy Consult for Zosyn & Vancomycin Indication: osteomyelitis of left great toe/foot  No Known Allergies  Patient Measurements: Height: 5\' 3"  (160 cm) Weight: 104 lb 1.6 oz (47.219 kg) IBW/kg (Calculated) : 56.9    ( using actual weight which is < IBW in pt with right leg AKA)  Vital Signs: Temp: 97.5 F (36.4 C) (05/14 0221) Temp src: Oral (05/14 0221) BP: 108/72 mmHg (05/14 0221) Pulse Rate: 89 (05/14 0030) Intake/Output from previous day:   Intake/Output from this shift:    Labs:  Recent Labs  08/13/13 2002  WBC 7.2  HGB 10.5*  PLT 261  CREATININE 1.85*   Estimated Creatinine Clearance: 25.2 ml/min (by C-G formula based on Cr of 1.85). No results found for this basename: VANCOTROUGH, VANCOPEAK, VANCORANDOM, GENTTROUGH, GENTPEAK, GENTRANDOM, TOBRATROUGH, TOBRAPEAK, TOBRARND, AMIKACINPEAK, AMIKACINTROU, AMIKACIN,  in the last 72 hours   Microbiology: No results found for this or any previous visit (from the past 720 hour(s)).  Medical History: Past Medical History  Diagnosis Date  . HTN (hypertension)   . Smoking hx 03/25/2013  . Alcohol abuse 03/25/2013  . CHF (congestive heart failure) 03/25/2013    Medications:  Scheduled:  . aspirin EC  81 mg Oral Daily  . atorvastatin  40 mg Oral QPM  . carvedilol  6.25 mg Oral BID WC  . citalopram  20 mg Oral Daily  . folic acid  1 mg Oral Daily  . piperacillin-tazobactam (ZOSYN)  IV  3.375 g Intravenous Q8H  . vancomycin  500 mg Intravenous Once   Followed by  . vancomycin  500 mg Intravenous Q24H  . vitamin C  500 mg Oral Daily  . zinc sulfate  220 mg Oral Daily   Infusions:  . sodium chloride 1,000 mL (08/13/13 2226)   PRN:   Assessment: 32 yr small male with right AKA leg and osteomyelitis of left great toe to be treated with Zosyn and vancomycin.  Also noted acute renal insult with predicted CrCl 20-79ml/hr.  Pt has hx of HTN EtOH abuse, and CHF.  Goal of  Therapy:  Will use standard Zosyn regimen of 3.375gm IV q8h (4hr infusions) since CrCl >105ml/min.  Desire vancomycin trough level 15-44mcg/ml.  Plan:  1. Zosyn 3.375gm IV q8h (4hr infusion) 2. Vancomycin 500mg  IV now, then 3. Start Vancomycin 500mg  IV q24hr in 15hr 4.  Monitor for indices of infection and renal function 5.  Check steady state trough vancomycin level as clinically indicated  Penni Homans 08/14/2013,2:42 AM

## 2013-08-15 LAB — BASIC METABOLIC PANEL
BUN: 44 mg/dL — ABNORMAL HIGH (ref 6–23)
CO2: 18 mEq/L — ABNORMAL LOW (ref 19–32)
Calcium: 9.5 mg/dL (ref 8.4–10.5)
Chloride: 110 mEq/L (ref 96–112)
Creatinine, Ser: 1.5 mg/dL — ABNORMAL HIGH (ref 0.50–1.35)
GFR calc Af Amer: 53 mL/min — ABNORMAL LOW (ref >90)
GFR calc non Af Amer: 46 mL/min — ABNORMAL LOW (ref >90)
Glucose, Bld: 99 mg/dL (ref 70–99)
Potassium: 4.6 mEq/L (ref 3.7–5.3)
SODIUM: 140 meq/L (ref 137–147)

## 2013-08-15 MED ORDER — VANCOMYCIN HCL IN DEXTROSE 750-5 MG/150ML-% IV SOLN
750.0000 mg | INTRAVENOUS | Status: DC
  Administered 2013-08-15 – 2013-08-17 (×3): 750 mg via INTRAVENOUS
  Filled 2013-08-15 (×8): qty 150

## 2013-08-15 MED ORDER — SODIUM CHLORIDE 0.9 % IV SOLN: 1000.0000 mL | INTRAVENOUS | Status: AC

## 2013-08-15 NOTE — Progress Notes (Signed)
Patient removed bandage from foot once again. Refused for new dressing to be applied.

## 2013-08-15 NOTE — Progress Notes (Signed)
PROGRESS NOTE/Transfer summary  Rodney [name]:[id number] DOB: 1945-10-06 DOA: 08/13/2013 PCP: Rafael Bihari, MD  Summary: 68 year old man with history of peripheral vascular disease was seen by his primary care physician 5/14 for "overgrown" left first toenail. The nail was removed, his physician was concerned for osteomyelitis and so sent the patient to the emergency department where plain films suggested osteomyelitis. He was admitted, placed on antibiotics. MRI obtained which confirmed osteomyelitis. He remained hemodynamically stable. Because of his history of peripheral vascular disease with ABI 0.57, case was discussed with vascular surgery Dr. Kellie Simmering. Patient may benefit from angiogram to define extent of disease and possibility of intervention to minimize the extent of amputation in the future. In addition, because of his severe cardiomyopathy, any surgical procedure would be deferred to a larger center.  Assessment/Plan: 1. Left great toe terminal phalanx osteomyelitis. No clinical change. 2. Acute renal failure with associated hyperkalemia. Likely prerenal azotemia complicated by diuretics and ACE inhibitor. Hyperkalemia resolved. Renal function continues to improve. Adequate urine output. 3. 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  4. Severe peripheral vascular disease. Left lower extremity ABI 0.57 03/2013. Vascular surgery raised the possibility of intervention in the future during that admission. 5. Chronic systolic congestive heart failure, moderate to severe aortic insufficiency, felt to be mixed in etiology (coronary artery disease and alcohol). Clinically compensated. Underwent cardiac catheterization 03/2013 which demonstrated diffuse three-vessel disease. Cardiology recommended medical management.  6. Tobacco dependence  7. History of alcohol abuse.   I discussed current clinical issues and  recommendations with the patient at the bedside and recommended transfer [facility] Patient agrees and understands osteomyelitis unlikely to be c chair without surgery/intervention which requires a higher level of care.  Continue antibiotics.   Continue IV fluids for 8 hours, monitor fluid balance given CHF, but appears well compensated, repeat basic metabolic panel in the morning.  Transfer to [facility] today, team 3, accepting MD Dr. Hartford Poli (case discussed with him). Dr. Kellie Simmering [facility] will consult and should be notified of patient's arrival. Discussed with flow manager.  Code Status: full code DVT prophylaxis: heparin Family Communication: none present Disposition Plan:   Murray Hodgkins, MD  [facility]  Pager [phone] If 7PM-7AM, please contact night-coverage at [email or web address], password [facility] 08/15/2013, 10:04 AM  LOS: 2 days   Consultants:  General surgery  Procedures:    Antibiotics:  Zosyn 5/14 >>   Vancomycin 5/14  HPI/Subjective: No issues overnight. No pain in the foot or toe. Reports normal sensation in the range of movement. He feels like his toe is healing up.  Objective: Filed Vitals:   08/14/13 0221 08/14/13 0636 08/14/13 1500 08/15/13 0603  BP: 108/72 103/49 124/55 107/58  Pulse:  76 78 73  Temp: 97.5 F (36.4 C) 98.2 F (36.8 C) 98.5 F (36.9 C) 98.7 F (37.1 C)  TempSrc: Oral Oral Oral Oral  Resp: 20 20 20 20   Height: 5\' 3"  (1.6 m)     Weight: 47.219 kg (104 lb 1.6 oz)   48.8 kg (107 lb 9.4 oz)  SpO2: 100% 99% 100% 100%    Intake/Output Summary (Last 24 hours) at 08/15/13 1004 Last data filed at 08/15/13 0839  Gross per 24 hour  Intake   1640 ml  Output    500 ml  Net   1140 ml     Filed Weights   08/14/13 0221 08/15/13 0603  Weight: 47.219 kg (104 lb 1.6 oz) 48.8 kg (107 lb 9.4  oz)    Exam:   Afebrile, vital signs are stable. No hypoxia.  Gen. Appears comfortable, calm.  Psychiatric. Speech fluent and clear.  Cardiovascular.  Regular rate and rhythm. No murmur, rub or gallop. No left lower extremity edema.  Respiratory. Clear to auscultation bilaterally. No wheezes, rales or rhonchi. Normal respiratory effort.  Left lower extremity. Leg and foot appear grossly unchanged. Hyperpigmentation of the toe and to a lesser degree the foot appears unchanged. Nailbeds of the first toe remains pink and there is grossly normal capillary refill in the toes. Full range of movement of the foot and toe. No pain with palpation. Sensation grossly normal.  Data Reviewed:  BUN, creatinine continued to trend downwards, 44/1.5 today   Scheduled Meds: . aspirin EC  81 mg Oral Daily  . atorvastatin  40 mg Oral QPM  . carvedilol  6.25 mg Oral BID WC  . citalopram  20 mg Oral Daily  . folic acid  1 mg Oral Daily  . heparin subcutaneous  5,000 Units Subcutaneous 3 times per day  . piperacillin-tazobactam (ZOSYN)  IV  3.375 g Intravenous Q8H  . vancomycin  500 mg Intravenous Q24H  . vitamin C  500 mg Oral Daily  . zinc sulfate  220 mg Oral Daily   Continuous Infusions: . sodium chloride 1,000 mL (08/15/13 0730)    Principal Problem:   Osteomyelitis of toe of left foot Active Problems:   CHF (congestive heart failure)   Smoking hx   Severe protein-calorie malnutrition   AKI (acute kidney injury)   Left foot pain   Chronic systolic CHF (congestive heart failure)   Time spent 25 minutes

## 2013-08-15 NOTE — Consult Note (Signed)
WOC wound consult note Reason for Consult: Osteomyelitis left great toe. PVD Wound type:Osteomyelitis Measurement:Left great toe missing.  Wound bed: Red, dry Drainage (amount, consistency, odor) Minimal to no drainage.  Periwound:Erythema Dressing procedure/placement/frequency:Spoke with bedside nurse.  [facility] team will usually defer patient ortho service as Osteomyelitis is beyond [place] scope of practice.  Patient is being transferred to [facility] today for follow up with ortho and vascular team.  Instructed bedside RN to keep dry dressing on toe until transfer is complete.  Patient has removed the dressing himself today twice.  RN to continue to monitor.   Will not follow at this time.  Please re-consult if needed.  Domenic Moras RN BSN [place] Pager [phone]

## 2013-08-15 NOTE — Progress Notes (Signed)
Report called to Vikki Ports at [place] on the way to transport patient. IV vancomycin infusing at this time so it is not missed in transport.

## 2013-08-15 NOTE — Progress Notes (Signed)
Bed Control at [facility] called about bed for patient. No bed available at this time. Dr. Sarajane Jews aware.

## 2013-08-15 NOTE — Progress Notes (Signed)
ANTIBIOTIC CONSULT NOTE - FOLLOW UP  Pharmacy Consult for Vancomycin and Zosyn  Indication: cellulitis/osteomyelitis  No Known Allergies  Patient Measurements: Height: 5\' 3"  (160 cm) Weight: 107 lb 9.4 oz (48.8 kg) IBW/kg (Calculated) : 56.9  Vital Signs: Temp: 98.7 F (37.1 C) (05/15 0603) Temp src: Oral (05/15 0603) BP: 107/58 mmHg (05/15 0603) Pulse Rate: 73 (05/15 0603) Intake/Output from previous day: 05/14 0701 - 05/15 0700 In: 1400 [I.V.:1250; IV Piggyback:150] Out: 700 [Urine:700] Intake/Output from this shift: Total I/O In: 240 [P.O.:240] Out: -   Labs:  Recent Labs  08/13/13 2002 08/14/13 0514 08/15/13 0508  WBC 7.2  --   --   HGB 10.5*  --   --   PLT 261  --   --   CREATININE 1.85* 1.59* 1.50*   Estimated Creatinine Clearance: 32.1 ml/min (by C-G formula based on Cr of 1.5). No results found for this basename: VANCOTROUGH, VANCOPEAK, VANCORANDOM, GENTTROUGH, GENTPEAK, GENTRANDOM, TOBRATROUGH, TOBRAPEAK, TOBRARND, AMIKACINPEAK, AMIKACINTROU, AMIKACIN,  in the last 72 hours   Past Medical History  Diagnosis Date  . HTN (hypertension)   . Smoking hx 03/25/2013  . Alcohol abuse 03/25/2013  . CHF (congestive heart failure) 03/25/2013   Microbiology: Recent Results (from the past 720 hour(s))  MRSA PCR SCREENING     Status: None   Collection Time    08/14/13  3:40 AM      Result Value Ref Range Status   MRSA by PCR NEGATIVE  NEGATIVE Final   Comment:            The GeneXpert MRSA Assay (FDA     approved for NASAL specimens     only), is one component of a     comprehensive MRSA colonization     surveillance program. It is not     intended to diagnose MRSA     infection nor to guide or     monitor treatment for     MRSA infections.   Medications Prior to Admission  Medication Sig Dispense Refill  . aspirin EC 81 MG tablet Take 81 mg by mouth daily.      Marland Kitchen atorvastatin (LIPITOR) 40 MG tablet Take 40 mg by mouth every evening.      . carvedilol  (COREG) 6.25 MG tablet Take 1 tablet (6.25 mg total) by mouth 2 (two) times daily with a meal.      . escitalopram (LEXAPRO) 5 MG tablet Take 5 mg by mouth daily.      . folic acid (FOLVITE) 1 MG tablet Take 1 tablet (1 mg total) by mouth daily.      . furosemide (LASIX) 40 MG tablet Take 1 tablet (40 mg total) by mouth daily.  30 tablet    . lisinopril (PRINIVIL,ZESTRIL) 5 MG tablet Take 5 mg by mouth daily.      . pantoprazole (PROTONIX) 40 MG tablet Take 1 tablet (40 mg total) by mouth daily.      Marland Kitchen spironolactone (ALDACTONE) 25 MG tablet Take 25 mg by mouth daily.      . vitamin C (ASCORBIC ACID) 500 MG tablet Take 500 mg by mouth daily.      Marland Kitchen zinc sulfate 220 MG capsule Take 220 mg by mouth daily.       Anti-infectives   Start     Dose/Rate Route Frequency Ordered Stop   08/14/13 1800  vancomycin (VANCOCIN) 500 mg in sodium chloride 0.9 % 100 mL IVPB     500 mg 100  mL/hr over 60 Minutes Intravenous Every 24 hours 08/14/13 0250     08/14/13 0600  piperacillin-tazobactam (ZOSYN) IVPB 3.375 g  Status:  Discontinued     3.375 g 100 mL/hr over 30 Minutes Intravenous 3 times per day 08/14/13 0239 08/14/13 0240   08/14/13 0400  piperacillin-tazobactam (ZOSYN) IVPB 3.375 g     3.375 g 12.5 mL/hr over 240 Minutes Intravenous Every 8 hours 08/14/13 0239     08/14/13 0300  vancomycin (VANCOCIN) 500 mg in sodium chloride 0.9 % 100 mL IVPB     500 mg 100 mL/hr over 60 Minutes Intravenous  Once 08/14/13 0250 08/14/13 0434   08/14/13 0232  piperacillin-tazobactam (ZOSYN) IVPB 3.375 g  Status:  Discontinued     3.375 g 100 mL/hr over 30 Minutes Intravenous 3 times per day 08/14/13 0232 08/14/13 0237     Assessment: 68 yo M with hx right AKA leg in 03/2013 secondary to gangrene and new dx of osteomyelitis of left great toe.  He is currently on empiric, broad-spectrum antibiotics for this.  He is transferring to [facility] for vascular surgery consult.    He was admitted with acute renal injury which  continues to improve.  24hr I/O +700.  Diuretic & ACEI on hold.   Vancomycin 5/14>> Zosyn 5/14>>   Goal of Therapy:  Vancomycin trough level 15-20 mcg/ml Eradicate infection.   Plan:  Increase Vancomycin 750mg  IV every 24 hours. Continue Zosyn 3.375gm IV every 8 hours. Check Vancomycin trough at steady state Monitor renal function, cx data, & pt progress.  Rodney [name] Rodney [name] 08/15/2013,11:14 AM

## 2013-08-15 NOTE — Progress Notes (Signed)
Patient has taken bandage off foot. Said he does not like it. Area cleaned and new bandage applied.

## 2013-08-16 DIAGNOSIS — I5021 Acute systolic (congestive) heart failure: Secondary | ICD-10-CM

## 2013-08-16 DIAGNOSIS — J9600 Acute respiratory failure, unspecified whether with hypoxia or hypercapnia: Secondary | ICD-10-CM

## 2013-08-16 MED ORDER — NICOTINE 21 MG/24HR TD PT24
21.0000 mg | MEDICATED_PATCH | Freq: Every day | TRANSDERMAL | Status: DC
  Administered 2013-08-16 – 2013-08-22 (×7): 21 mg via TRANSDERMAL
  Filled 2013-08-16 (×7): qty 1

## 2013-08-16 MED ORDER — HYDROCODONE-ACETAMINOPHEN 5-325 MG PO TABS
1.0000 | ORAL_TABLET | ORAL | Status: DC | PRN
  Administered 2013-08-17 – 2013-08-18 (×2): 1 via ORAL
  Filled 2013-08-16 (×2): qty 1

## 2013-08-16 MED ORDER — PIPERACILLIN-TAZOBACTAM 3.375 G IVPB
3.3750 g | Freq: Three times a day (TID) | INTRAVENOUS | Status: DC
  Administered 2013-08-17 – 2013-08-22 (×16): 3.375 g via INTRAVENOUS
  Filled 2013-08-16 (×25): qty 50

## 2013-08-16 NOTE — Progress Notes (Signed)
Patient ID: Rodney [name], male   DOB: 06-19-1945, 68 y.o.   MRN: [id number] [facility] PROGRESS NOTE  Rodney [name] GXQ:[id number] DOB: [birth year] DOA: 08/13/2013 PCP: Rodney Bihari, MD  Brief narrative: 68 year old man with history of peripheral vascular disease was seen by his primary care physician 5/14 for "overgrown" left first toenail. The nail was removed, his physician was concerned for osteomyelitis and so sent the patient to the emergency department where plain films suggested osteomyelitis. He was admitted, placed on antibiotics. MRI obtained which confirmed osteomyelitis. He remained hemodynamically stable. Because of his history of peripheral vascular disease with ABI 0.57, case was discussed with vascular surgery Dr. Kellie [name]. Patient may benefit from angiogram to define extent of disease and possibility of intervention to minimize the extent of amputation in the future. In addition, because of his severe cardiomyopathy, any surgical procedure would be deferred to a larger center.   Assessment/Plan:  1. Left great toe terminal phalanx osteomyelitis. No clinical change. Continue broad spectrum ABX Vancomycin and Zosyn. Will notify [facility]. 2. Acute renal failure with associated hyperkalemia. Likely prerenal azotemia complicated by diuretics and ACE inhibitor. Hyperkalemia resolved. Renal function continues to improve, creatinine is trending down. Adequate urine output. Repeat BMP in AM 3. 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  4. Severe peripheral vascular disease. Left lower extremity ABI 0.57 03/2013. Vascular surgery raised the possibility of intervention in the future during that admission. 5. Chronic systolic congestive heart failure, moderate to severe aortic insufficiency, felt to be mixed in etiology (coronary artery disease and alcohol). Clinically compensated. Underwent cardiac catheterization 03/2013 which  demonstrated diffuse three-vessel disease. Cardiology recommended medical management.  6. Anemia of chronic disease - no signs of active bleeding, repeat CBC in AM 7. Moderate malnutrition - secondary to progressive nature of vascular disease imposed on history of chronic alcohol and tobacco abuse, advance diet as pt able to tolerate  8. Tobacco dependence - discussed cessation in detail and pt verbalized  9. History of alcohol abuse.- pt denies active use   Code Status: full code  DVT prophylaxis: heparin  Family Communication: none present  Disposition Plan: remains inpatient   Consultants:   Vascular surgery  Antibiotics:   Zosyn 5/14 >>   Vancomycin 5/14 >> Procedures/Studies: Mr Foot Left Wo Contrast  08/14/2013  Moderately motion degraded study, findings are consistent with osteomyelitis of the tip of the terminal phalanx of the great toe.   HPI/Subjective: No events overnight.   Objective: Filed Vitals:   08/15/13 1714 08/15/13 1844 08/15/13 2155 08/16/13 0700  BP: 106/60 114/54 108/55 100/59  Pulse: 77 91 64 64  Temp:  99.2 F (37.3 C) 98.3 F (36.8 C) 98.4 F (36.9 C)  TempSrc:   Oral Oral  Resp: 16 15 18 18   Height:      Weight:    49.1 kg (108 lb 3.9 oz)  SpO2: 99% 100% 99% 100%    Intake/Output Summary (Last 24 hours) at 08/16/13 0829 Last data filed at 08/16/13 0631  Gross per 24 hour  Intake   1440 ml  Output    950 ml  Net    490 ml    Exam:   General:  Pt is alert, follows commands appropriately, not in acute distress  Cardiovascular: Regular rate and rhythm, S1/S2, no murmurs, no rubs, no gallops  Respiratory: Clear to auscultation bilaterally, no wheezing, no crackles, no rhonchi  Abdomen: Soft, non tender, non distended, bowel sounds present, no  guarding  Extremities: Left great toe with absent nail, small open area with some serous drainage near the tip. Nontender. Perfusion to the distal portion of the toe appears intact. No palpable DP  (consistent with previous exams); Right AKA  Neuro: Grossly nonfocal  Data Reviewed: Basic Metabolic Panel:  Recent Labs Lab 08/13/13 2002 08/14/13 0514 08/15/13 0508  NA 138 139 140  K 5.7* 4.9 4.6  CL 106 110 110  CO2 19 17* 18*  GLUCOSE 102* 117* 99  BUN 79* 65* 44*  CREATININE 1.85* 1.59* 1.50*  CALCIUM 11.1* 9.5 9.5   CBC:  Recent Labs Lab 08/13/13 2002  WBC 7.2  NEUTROABS 3.8  HGB 10.5*  HCT 31.3*  MCV 83.0  PLT 261   Recent Results (from the past 240 hour(s))  MRSA PCR SCREENING     Status: None   Collection Time    08/14/13  3:40 AM      Result Value Ref Range Status   MRSA by PCR NEGATIVE  NEGATIVE Final   Comment:            The GeneXpert MRSA Assay (FDA     approved for NASAL specimens     only), is one component of a     comprehensive MRSA colonization     surveillance program. It is not     intended to diagnose MRSA     infection nor to guide or     monitor treatment for     MRSA infections.     Scheduled Meds: . aspirin EC  81 mg Oral Daily  . atorvastatin  40 mg Oral QPM  . carvedilol  6.25 mg Oral BID WC  . citalopram  20 mg Oral Daily  . folic acid  1 mg Oral Daily  . heparin subcutaneous  5,000 Units Subcutaneous 3 times per day  . (ZOSYN  IV  3.375 g Intravenous Q8H  . vancomycin  750 mg Intravenous Q24H  . vitamin C  500 mg Oral Daily  . zinc sulfate  220 mg Oral Daily   Continuous Infusions:  Theodis Blaze, MD  [facility] Pager [phone]  If 7PM-7AM, please contact night-coverage [email or web address] Password [email or web address] 08/16/2013, 8:29 AM   LOS: 3 days

## 2013-08-16 NOTE — Consult Note (Signed)
[facility] [place]  Reason for Consult:  Non healing toe ulcer with osteomyelitis Referring Physician:  Sarajane Jews MRN #:  [id number]  History of Present Illness: This is a 68 y.o. male who presented back in December with septic shock and ischemic feet.  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   ABI on the left at that time was 0.5.    He presented to [facility] ER with concerns of osteomyelitis in his left great toe after he had a toenail removed.  He was placed on ABx.  Plain film revealed osteomyelitis and MRI confirmed it.  He was placed on Vanc/Zosyn.  He was also found to be in AKI with a creatinine of 1.85 and BUN of 79 as well as hyperkalemic with a K+ of 5.7 on admission.    He is transferred to [facility] for further intervention.  He is on a beta blocker and ACEI for HTN.  He does have a hx of etoh abuse, but has not drank since Christmas.  He does continue to smoke about a pack a day.  He does have a hx of CAD that is medically managed.  He also has hx of moderate to severe AI.  Past Medical History  Diagnosis Date  . HTN (hypertension)   . Smoking hx 03/25/2013  . Alcohol abuse 03/25/2013  . CHF (congestive heart failure) 03/25/2013   Past Surgical History  Procedure Laterality Date  . Above knee leg amputation Right   . Amputation Right 03/26/2013    Procedure: AMPUTATION ABOVE KNEE;  Surgeon: Rosetta Posner, MD;  Location: [facility] OR;  Service: Vascular;  Laterality: Right;    No Known Allergies  Prior to Admission medications   Medication Sig Start Date End Date Taking? Authorizing Provider  aspirin EC 81 MG tablet Take 81 mg by mouth daily.   Yes Historical Provider, MD  atorvastatin (LIPITOR) 40 MG tablet Take 40 mg by mouth every evening. 04/07/13  Yes [name], MD  carvedilol (COREG) 6.25 MG tablet Take 1 tablet (6.25 mg total) by mouth 2 (two) times daily with a meal. 04/07/13  Yes [name], MD    escitalopram (LEXAPRO) 5 MG tablet Take 5 mg by mouth daily. 07/31/13  Yes Historical Provider, MD  folic acid (FOLVITE) 1 MG tablet Take 1 tablet (1 mg total) by mouth daily. 04/07/13  Yes [name], MD  furosemide (LASIX) 40 MG tablet Take 1 tablet (40 mg total) by mouth daily. 04/07/13  Yes [name], MD  lisinopril (PRINIVIL,ZESTRIL) 5 MG tablet Take 5 mg by mouth daily. 04/07/13  Yes [name], MD  pantoprazole (PROTONIX) 40 MG tablet Take 1 tablet (40 mg total) by mouth daily. 04/07/13  Yes [name], MD  spironolactone (ALDACTONE) 25 MG tablet Take 25 mg by mouth daily. 04/07/13  Yes [name], MD  vitamin C (ASCORBIC ACID) 500 MG tablet Take 500 mg by mouth daily.   Yes Historical Provider, MD  zinc sulfate 220 MG capsule Take 220 mg by mouth daily.   Yes Historical Provider, MD    History   Social History  . Marital Status: Single    Spouse Name: N/A    Number of Children: N/A  . Years of Education: N/A   Occupational History  . Not on file.   Social History Main Topics  . Smoking status: Current Some Day Smoker    Types: Cigarettes  .  Smokeless tobacco: Not on file  . Alcohol Use: Yes     Comment: everyday  . Drug Use: Yes    Special: Marijuana  . Sexual Activity: No   Other Topics Concern  . Not on file   Social History Narrative  . No narrative on file     History reviewed. No pertinent family history.  ROS: [x]  Positive   [ ]  Negative   [ ]  All sytems reviewed and are negative  Cardiovascular: []  chest pain/pressure []  palpitations []  SOB lying flat []  DOE []  pain in legs while walking []  pain in legs at rest []  pain in legs at night [x]  non-healing ulcers []  hx of DVT []  swelling in legs  Pulmonary: []  productive cough []  asthma/wheezing []  home O2  Neurologic: []  weakness in []  arms []  legs []  numbness in []  arms []  legs []  hx of CVA []  mini stroke [] difficulty speaking or slurred speech []  temporary loss of  vision in one eye []  dizziness  Hematologic: []  hx of cancer []  bleeding problems []  problems with blood clotting easily  Endocrine:   []  diabetes []  thyroid disease  GI []  vomiting blood []  blood in stool  GU: [x]  CKD/renal failure []  HD--[]  M/W/F or []  T/T/S []  burning with urination []  blood in urine  Psychiatric: []  anxiety []  depression  Musculoskeletal: []  arthritis []  joint pain  Integumentary: []  rashes []  ulcers  Constitutional: []  fever []  chills   Physical Examination  Filed Vitals:   08/16/13 0700  BP: 100/59  Pulse: 64  Temp: 98.4 F (36.9 C)  Resp: 18   Body mass index is 19.18 kg/(m^2).  General:  WDWN in NAD Gait: Not observed HENT: WNL, normocephalic Eyes: Pupils equal Pulmonary: normal non-labored breathing, without Rales, rhonchi,  wheezing Cardiac: regular, without carotid bruits Abdomen: soft, NT/ND, no masses Skin: without rashes, with ulcer or left great toe Vascular Exam/Pulses:  Right Left  Radial 2+ (normal) 2+ (normal)  Ulnar Non palpable Non palpable  Femoral 3+ (hyperdynamic) 1+ (weak)-2+  Popliteal AKA Non palpable  DP AKA absent  PT AKA absent   Extremities: without ischemic changes, without Gangrene , without cellulitis; with open wounds; Left great toe with evidence of osteomyelitis Musculoskeletal: no muscle wasting or atrophy  Neurologic: A&O X 3; Appropriate Affect ; SENSATION: normal; MOTOR FUNCTION:  moving all extremities equally. Speech is fluent/normal Psychiatric:  Normal affect Lymph:  No inguinal lymphadenopathy   CBC    Component Value Date/Time   WBC 7.2 08/13/2013 2002   RBC 3.77* 08/13/2013 2002   HGB 10.5* 08/13/2013 2002   HCT 31.3* 08/13/2013 2002   PLT 261 08/13/2013 2002   MCV 83.0 08/13/2013 2002   MCH 27.9 08/13/2013 2002   MCHC 33.5 08/13/2013 2002   RDW 14.6 08/13/2013 2002   LYMPHSABS 2.6 08/13/2013 2002   MONOABS 0.5 08/13/2013 2002   EOSABS 0.3 08/13/2013 2002   BASOSABS 0.0 08/13/2013  2002    BMET    Component Value Date/Time   NA 140 08/15/2013 0508   K 4.6 08/15/2013 0508   CL 110 08/15/2013 0508   CO2 18* 08/15/2013 0508   GLUCOSE 99 08/15/2013 0508   BUN 44* 08/15/2013 0508   CREATININE 1.50* 08/15/2013 0508   CALCIUM 9.5 08/15/2013 0508   GFRNONAA 46* 08/15/2013 0508   GFRAA 53* 08/15/2013 0508    COAGS: Lab Results  Component Value Date   INR 1.25 04/02/2013   INR 2.12* 03/26/2013  Non-Invasive Vascular Imaging:  MRI 08/14/13 IMPRESSION:  1. Moderately motion degraded study.  2. In conjunction with prior plain films, MR findings are consistent  with osteomyelitis of the tip of the terminal phalanx of the great  toe.  Statin:  yes Beta Blocker:  yes Aspirin:  yes ACEI:  yes ARB:  no Other antiplatelets/anticoagulants:  no   ASSESSMENT: This is a 68 y.o. male with hx of PVD and admitted with osteomyelitis s/p toenail removal.  PLAN: -will plan for aortogram with possible intervention on Monday to evaluate LLE arterial anatomy.   Leontine Locket, PA-C [facility] [phone]  Agree with above Weak L femoral pulse Plan angio Monday per Dr [name] to see if intervention is possible

## 2013-08-17 LAB — BASIC METABOLIC PANEL
BUN: 24 mg/dL — AB (ref 6–23)
CHLORIDE: 101 meq/L (ref 96–112)
CO2: 20 mEq/L (ref 19–32)
Calcium: 9.9 mg/dL (ref 8.4–10.5)
Creatinine, Ser: 1.24 mg/dL (ref 0.50–1.35)
GFR, EST AFRICAN AMERICAN: 67 mL/min — AB (ref >90)
GFR, EST NON AFRICAN AMERICAN: 58 mL/min — AB (ref >90)
Glucose, Bld: 88 mg/dL (ref 70–99)
Potassium: 4.8 mEq/L (ref 3.7–5.3)
Sodium: 133 mEq/L — ABNORMAL LOW (ref 137–147)

## 2013-08-17 LAB — CBC
HCT: 27.8 % — ABNORMAL LOW (ref 39.0–52.0)
Hemoglobin: 9.3 g/dL — ABNORMAL LOW (ref 13.0–17.0)
MCH: 27.6 pg (ref 26.0–34.0)
MCHC: 33.5 g/dL (ref 30.0–36.0)
MCV: 82.5 fL (ref 78.0–100.0)
Platelets: 239 10*3/uL (ref 150–400)
RBC: 3.37 MIL/uL — ABNORMAL LOW (ref 4.22–5.81)
RDW: 14.4 % (ref 11.5–15.5)
WBC: 6.6 10*3/uL (ref 4.0–10.5)

## 2013-08-17 MED ORDER — SODIUM CHLORIDE 0.45 % IV SOLN
INTRAVENOUS | Status: DC
  Administered 2013-08-17: 20:00:00 via INTRAVENOUS

## 2013-08-17 NOTE — Progress Notes (Signed)
Patient ID: Rodney [name], male   DOB: 1945-08-08, 68 y.o.   MRN: [id number]  [facility] PROGRESS NOTE  Rodney [name] CNO:[id number] DOB: 1946/02/26 DOA: 08/13/2013 PCP: Rafael Bihari, MD  Brief narrative:  67 year old man with history of peripheral vascular disease was seen by his primary care physician 5/14 for "overgrown" left first toenail. The nail was removed, his physician was concerned for osteomyelitis and so sent the patient to the emergency department where plain films suggested osteomyelitis. He was admitted, placed on antibiotics. MRI obtained which confirmed osteomyelitis. He remained hemodynamically stable. Because of his history of peripheral vascular disease with ABI 0.57, case was discussed with vascular surgery Dr. Kellie Simmering. Patient may benefit from angiogram to define extent of disease and possibility of intervention to minimize the extent of amputation in the future. In addition, because of his severe cardiomyopathy, any surgical procedure would be deferred to a larger center.   Assessment/Plan:  1. Left great toe terminal phalanx osteomyelitis. No clinical change. Continue broad spectrum ABX Vancomycin and Zosyn. Appreciate VVS assistance and plan for angio in AM 2. Acute renal failure with associated hyperkalemia. Likely prerenal azotemia complicated by diuretics and ACE inhibitor. Hyperkalemia resolved. Renal function continues to improve, creatinine is WNL this AM. Adequate urine output. Repeat BMP in AM 3. 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  4. Severe peripheral vascular disease. Left lower extremity ABI 0.57 03/2013. Vascular surgery plan for angio in AM. 5. Chronic systolic congestive heart failure, moderate to severe aortic insufficiency, felt to be mixed in etiology (coronary artery disease and alcohol). Clinically compensated. Underwent cardiac catheterization 03/2013 which demonstrated  diffuse three-vessel disease. Cardiology recommended medical management.  6. Anemia of chronic disease - no signs of active bleeding, slight drop in Hg overnight, repeat CBC in AM 7. Moderate malnutrition - secondary to progressive nature of vascular disease imposed on history of chronic alcohol and tobacco abuse, advance diet as pt able to tolerate  8. Tobacco dependence - discussed cessation in detail and pt verbalized  9. History of alcohol abuse.- pt denies active use   Code Status: full code  DVT prophylaxis: heparin  Family Communication: none present  Disposition Plan: remains inpatient   Consultants:  Vascular surgery  Antibiotics:  Zosyn 5/14 >>  Vancomycin 5/14 >> Procedures/Studies:  Mr Foot Left Wo Contrast 08/14/2013 Moderately motion degraded study, findings are consistent with osteomyelitis of the tip of the terminal phalanx of the great toe.   HPI/Subjective: No events overnight.   Objective: Filed Vitals:   08/16/13 0700 08/16/13 1258 08/16/13 2248 08/17/13 0546  BP: 100/59 99/48 103/49 109/54  Pulse: 64 67 69 81  Temp: 98.4 F (36.9 C) 99.1 F (37.3 C) 99.3 F (37.4 C) 99.3 F (37.4 C)  TempSrc: Oral Oral Oral Oral  Resp: 18 18 16 18   Height:      Weight: 49.1 kg (108 lb 3.9 oz)   49.6 kg (109 lb 5.6 oz)  SpO2: 100% 95% 100% 100%    Intake/Output Summary (Last 24 hours) at 08/17/13 1147 Last data filed at 08/17/13 0915  Gross per 24 hour  Intake    960 ml  Output   1851 ml  Net   -891 ml    Exam:   General:  Pt is alert, follows commands appropriately, not in acute distress  Cardiovascular: Regular rate and rhythm, S1/S2, no murmurs, no rubs, no gallops  Respiratory: Clear to auscultation bilaterally, no wheezing, no crackles, no rhonchi  Abdomen: Soft, non tender, non distended, bowel sounds present, no guarding  Data Reviewed: Basic Metabolic Panel:  Recent Labs Lab 08/13/13 2002 08/14/13 0514 08/15/13 0508 08/17/13 0619  NA 138  139 140 133*  K 5.7* 4.9 4.6 4.8  CL 106 110 110 101  CO2 19 17* 18* 20  GLUCOSE 102* 117* 99 88  BUN 79* 65* 44* 24*  CREATININE 1.85* 1.59* 1.50* 1.24  CALCIUM 11.1* 9.5 9.5 9.9   CBC:  Recent Labs Lab 08/13/13 2002 08/17/13 0619  WBC 7.2 6.6  NEUTROABS 3.8  --   HGB 10.5* 9.3*  HCT 31.3* 27.8*  MCV 83.0 82.5  PLT 261 239   Recent Results (from the past 240 hour(s))  MRSA PCR SCREENING     Status: None   Collection Time    08/14/13  3:40 AM      Result Value Ref Range Status   MRSA by PCR NEGATIVE  NEGATIVE Final   Comment:            The GeneXpert MRSA Assay (FDA     approved for NASAL specimens     only), is one component of a     comprehensive MRSA colonization     surveillance program. It is not     intended to diagnose MRSA     infection nor to guide or     monitor treatment for     MRSA infections.     Scheduled Meds: . aspirin EC  81 mg Oral Daily  . atorvastatin  40 mg Oral QPM  . carvedilol  6.25 mg Oral BID WC  . citalopram  20 mg Oral Daily  . folic acid  1 mg Oral Daily  . heparin subcutaneous  5,000 Units Subcutaneous 3 times per day  . nicotine  21 mg Transdermal Daily  . piperacillin-tazobactam (ZOSYN)  IV  3.375 g Intravenous Q8H  . vancomycin  750 mg Intravenous Q24H  . vitamin C  500 mg Oral Daily  . zinc sulfate  220 mg Oral Daily   Continuous Infusions: . sodium chloride     Theodis Blaze, MD  [facility] Pager [phone]  If 7PM-7AM, please contact night-coverage [email or web address] Password [email or web address] 08/17/2013, 11:47 AM   LOS: 4 days

## 2013-08-17 NOTE — Progress Notes (Signed)
Error.. No hall pass needed

## 2013-08-17 NOTE — Progress Notes (Signed)
Patient ID: Rodney [name], male   DOB: 06/02/1945, 68 y.o.   MRN: [id number] Plan angiography of left leg with possible intervention tomorrow per Dr. [name]  Patient has weaker left femoral pulse and right and may have iliac occlusive disease as well as femoral popliteal and tibial occlusive disease  Angio tomorrow midday

## 2013-08-18 ENCOUNTER — Encounter (HOSPITAL_COMMUNITY)
Admission: EM | Disposition: A | Discharge status: Nursing Home (Includes ICF) | Payer: Self-pay | Source: Home / Self Care | Attending: Internal Medicine

## 2013-08-18 DIAGNOSIS — M869 Osteomyelitis, unspecified: Secondary | ICD-10-CM

## 2013-08-18 HISTORY — PX: LOWER EXTREMITY ANGIOGRAM: SHX5508

## 2013-08-18 LAB — CBC
HCT: 26.3 % — ABNORMAL LOW (ref 39.0–52.0)
HEMOGLOBIN: 8.7 g/dL — AB (ref 13.0–17.0)
MCH: 27.4 pg (ref 26.0–34.0)
MCHC: 33.1 g/dL (ref 30.0–36.0)
MCV: 83 fL (ref 78.0–100.0)
Platelets: 227 10*3/uL (ref 150–400)
RBC: 3.17 MIL/uL — ABNORMAL LOW (ref 4.22–5.81)
RDW: 14.3 % (ref 11.5–15.5)
WBC: 7.1 10*3/uL (ref 4.0–10.5)

## 2013-08-18 LAB — BASIC METABOLIC PANEL
BUN: 24 mg/dL — ABNORMAL HIGH (ref 6–23)
CHLORIDE: 101 meq/L (ref 96–112)
CO2: 19 mEq/L (ref 19–32)
Calcium: 9.5 mg/dL (ref 8.4–10.5)
Creatinine, Ser: 1.25 mg/dL (ref 0.50–1.35)
GFR, EST AFRICAN AMERICAN: 66 mL/min — AB (ref >90)
GFR, EST NON AFRICAN AMERICAN: 57 mL/min — AB (ref >90)
Glucose, Bld: 91 mg/dL (ref 70–99)
POTASSIUM: 4.3 meq/L (ref 3.7–5.3)
SODIUM: 134 meq/L — AB (ref 137–147)

## 2013-08-18 LAB — VANCOMYCIN, TROUGH: Vancomycin Tr: 12.4 ug/mL (ref 10.0–20.0)

## 2013-08-18 SURGERY — ANGIOGRAM, LOWER EXTREMITY
Anesthesia: LOCAL | Laterality: Left

## 2013-08-18 MED ORDER — SODIUM CHLORIDE 0.9 % IV SOLN
500.0000 mg | Freq: Two times a day (BID) | INTRAVENOUS | Status: DC
  Administered 2013-08-19 – 2013-08-22 (×7): 500 mg via INTRAVENOUS
  Filled 2013-08-18 (×9): qty 500

## 2013-08-18 MED ORDER — MIDAZOLAM HCL 2 MG/2ML IJ SOLN
INTRAMUSCULAR | Status: AC
  Filled 2013-08-18: qty 2

## 2013-08-18 MED ORDER — SODIUM CHLORIDE 0.9 % IV SOLN
INTRAVENOUS | Status: DC
  Administered 2013-08-18 – 2013-08-19 (×2): via INTRAVENOUS

## 2013-08-18 MED ORDER — FENTANYL CITRATE 0.05 MG/ML IJ SOLN
INTRAMUSCULAR | Status: AC
  Filled 2013-08-18: qty 2

## 2013-08-18 MED ORDER — VANCOMYCIN HCL 500 MG IV SOLR
500.0000 mg | Freq: Once | INTRAVENOUS | Status: AC
  Administered 2013-08-18: 500 mg via INTRAVENOUS
  Filled 2013-08-18: qty 500

## 2013-08-18 MED ORDER — MORPHINE SULFATE 2 MG/ML IJ SOLN: 2.0000 mg | INTRAMUSCULAR | Status: DC | PRN

## 2013-08-18 MED ORDER — HEPARIN (PORCINE) IN NACL 2-0.9 UNIT/ML-% IJ SOLN
INTRAMUSCULAR | Status: AC
  Filled 2013-08-18: qty 1000

## 2013-08-18 MED ORDER — SODIUM CHLORIDE 0.9 % IV SOLN: 1.0000 mL/kg/h | INTRAVENOUS | Status: DC

## 2013-08-18 MED ORDER — ENOXAPARIN SODIUM 40 MG/0.4ML ~~LOC~~ SOLN
40.0000 mg | SUBCUTANEOUS | Status: DC
  Administered 2013-08-19 – 2013-08-22 (×4): 40 mg via SUBCUTANEOUS
  Filled 2013-08-18 (×6): qty 0.4

## 2013-08-18 MED ORDER — OXYCODONE-ACETAMINOPHEN 5-325 MG PO TABS: 1.0000 | ORAL_TABLET | ORAL | Status: DC | PRN

## 2013-08-18 MED ORDER — LIDOCAINE HCL (PF) 1 % IJ SOLN
INTRAMUSCULAR | Status: AC
  Filled 2013-08-18: qty 30

## 2013-08-18 NOTE — Progress Notes (Signed)
Patient ID: Tamer Baughman, male   DOB: 06/03/1945, 68 y.o.   MRN: [id number] [facility] PROGRESS NOTE  Zayn Selley VZD:[id number] DOB: [birth year] DOA: 08/13/2013 PCP: Rafael Bihari, MD  Brief narrative:  68 year old man with history of peripheral vascular disease was seen by his primary care physician 5/14 for "overgrown" left first toenail. The nail was removed, his physician was concerned for osteomyelitis and so sent the patient to the emergency department where plain films suggested osteomyelitis. He was admitted, placed on antibiotics. MRI obtained which confirmed osteomyelitis. He remained hemodynamically stable. Because of his history of peripheral vascular disease with ABI 0.57, case was discussed with vascular surgery Dr. Kellie Simmering. Patient may benefit from angiogram to define extent of disease and possibility of intervention to minimize the extent of amputation in the future. In addition, because of his severe cardiomyopathy, any surgical procedure would be deferred to a larger center.   Assessment/Plan:  1. Left great toe terminal phalanx osteomyelitis. No clinical change. Continue broad spectrum ABX Vancomycin and Zosyn. Appreciate VVS assistance and plan for angio today. 2. Acute renal failure with associated hyperkalemia. Likely prerenal azotemia complicated by diuretics and ACE inhibitor. Hyperkalemia resolved. Renal function now stable adn Cr is WNL. Adequate urine output. Repeat BMP in AM 3. 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  4. Severe peripheral vascular disease. Left lower extremity ABI 0.57 03/2013. Vascular surgery plan for angio today. 5. Chronic systolic congestive heart failure, moderate to severe aortic insufficiency, felt to be mixed in etiology (coronary artery disease and alcohol). Clinically compensated. Underwent cardiac catheterization 03/2013 which demonstrated diffuse three-vessel disease.  Conservative management for now.  6. Anemia of chronic disease - no signs of active bleeding, slight drop in Hg since admission, CBC pending this AM.  7. Moderate malnutrition - secondary to progressive nature of vascular disease imposed on history of chronic alcohol and tobacco abuse, advance diet as pt able to tolerate  8. Tobacco dependence - discussed cessation in detail and pt verbalized  9. History of alcohol abuse.- pt denies active use   Code Status: full code  DVT prophylaxis: heparin  Family Communication: none present  Disposition Plan: remains inpatient   Consultants:  Vascular surgery  Antibiotics:  Zosyn 5/14 >>  Vancomycin 5/14 >> Procedures/Studies:  Mr Foot Left Wo Contrast 08/14/2013 Moderately motion degraded study, findings are consistent with osteomyelitis of the tip of the terminal phalanx of the great toe.   HPI/Subjective: No events overnight.   Objective: Filed Vitals:   08/17/13 1638 08/17/13 2123 08/18/13 0248 08/18/13 0556  BP: 107/51 105/54  89/49  Pulse: 72 77  74  Temp:  99.4 F (37.4 C)  97.6 F (36.4 C)  TempSrc:  Oral  Oral  Resp:  20  19  Height:      Weight:   49.9 kg (110 lb 0.2 oz)   SpO2:  99%  99%    Intake/Output Summary (Last 24 hours) at 08/18/13 0706 Last data filed at 08/17/13 2300  Gross per 24 hour  Intake   1050 ml  Output   1550 ml  Net   -500 ml    Exam:   General:  Pt is alert, follows commands appropriately, not in acute distress  Cardiovascular: Regular rate and rhythm, S1/S2, no murmurs, no rubs, no gallops  Respiratory: Clear to auscultation bilaterally, no wheezing, no crackles, no rhonchi  Abdomen: Soft, non tender, non distended, bowel sounds present, no guarding  Extremities:Left great  toe with small open area with evidence of osteo. Non tender. Right AKA  Neuro: Grossly nonfocal  Data Reviewed: Basic Metabolic Panel:  Recent Labs Lab 08/13/13 2002 08/14/13 0514 08/15/13 0508 08/17/13 0619   NA 138 139 140 133*  K 5.7* 4.9 4.6 4.8  CL 106 110 110 101  CO2 19 17* 18* 20  GLUCOSE 102* 117* 99 88  BUN 79* 65* 44* 24*  CREATININE 1.85* 1.59* 1.50* 1.24  CALCIUM 11.1* 9.5 9.5 9.9   CBC:  Recent Labs Lab 08/13/13 2002 08/17/13 0619  WBC 7.2 6.6  NEUTROABS 3.8  --   HGB 10.5* 9.3*  HCT 31.3* 27.8*  MCV 83.0 82.5  PLT 261 239    Recent Results (from the past 240 hour(s))  MRSA PCR SCREENING     Status: None   Collection Time    08/14/13  3:40 AM      Result Value Ref Range Status   MRSA by PCR NEGATIVE  NEGATIVE Final   Comment:            The GeneXpert MRSA Assay (FDA     approved for NASAL specimens     only), is one component of a     comprehensive MRSA colonization     surveillance program. It is not     intended to diagnose MRSA     infection nor to guide or     monitor treatment for     MRSA infections.     Scheduled Meds: . aspirin EC  81 mg Oral Daily  . atorvastatin  40 mg Oral QPM  . carvedilol  6.25 mg Oral BID WC  . citalopram  20 mg Oral Daily  . folic acid  1 mg Oral Daily  . heparin subcutaneous  5,000 Units Subcutaneous 3 times per day  . nicotine  21 mg Transdermal Daily  . piperacillin-tazobactam (ZOSYN)  IV  3.375 g Intravenous Q8H  . vancomycin  750 mg Intravenous Q24H  . vitamin C  500 mg Oral Daily  . zinc sulfate  220 mg Oral Daily   Continuous Infusions: . sodium chloride 75 mL/hr at 08/17/13 2022   Theodis Blaze, MD  [facility] Pager [phone]  If 7PM-7AM, please contact night-coverage [email or web address] Password [email or web address] 08/18/2013, 7:06 AM   LOS: 5 days

## 2013-08-18 NOTE — Op Note (Addendum)
OPERATIVE NOTE   PROCEDURE: 1.  Left common femoral artery cannulation under ultrasound guidance 2.  Placement of catheter in aorta 3.  Aortogram 4.  Left leg runoff  PRE-OPERATIVE DIAGNOSIS: left great toe osteomyelitis  POST-OPERATIVE DIAGNOSIS: same as above   SURGEON: Adele Barthel, MD  ANESTHESIA: conscious sedation  ESTIMATED BLOOD LOSS: 50 cc  CONTRAST: 125 cc  FINDING(S):  Aorta: patent  Superior mesenteric artery: patent Celiac artery: patent   Right Left  RA Patent with <30% stenosis Patent  CIA Patent Patent with aneurysmal distal segment (1.6 mm lumen)  EIA Patent Patent (5 mm lumen)   IIA Patent but calcified with aneurysmal appearance (1.5 mm lumen) extending down to groin level, suggestive of possible abherrent anatomy Patent but diseased  CFA Patent Patent  SFA  Patent but small (4 mm lumen)  PFA  Patent  Pop  Above-the-knee segment not visualized, reconstitution of at-the-knee segment   Trif  Abberent high AT takeoff at knee, tibioperoneal trunk reconstitutes rapidly from collaterals  AT  Occludes shortly after take off  Pero  Patent  PT  Patent   SPECIMEN(S):  none  INDICATIONS:   Rodney [name] is a 68 y.o. male who presents with left great toe osteomyelitis.  The patient presents for: aortogram, left leg runoff, and possible runoff.  I discussed with the patient the nature of angiographic procedures, especially the limited patencies of any endovascular intervention.  The patient is aware of that the risks of an angiographic procedure include but are not limited to: bleeding, infection, access site complications, renal failure, embolization, rupture of vessel, dissection, possible need for emergent surgical intervention, possible need for surgical procedures to treat the patient's pathology, and stroke and death.  The patient is aware of the risks and agrees to proceed.  DESCRIPTION: After full informed consent was obtained from the patient, the patient  was brought back to the angiography suite.  The patient was placed supine upon the angiography table and connected to monitoring equipment.  The patient was then given conscious sedation, the amounts of which are documented in the patient's chart.  The patient was prepped and drape in the standard fashion for an angiographic procedure.  At this point, attention was turned to the left groin as the patient had a concerning groin bulge in the right side, worrisome for an incarcerated hernia.  Under ultrasound guidance, the left common femoral artery was cannulated with a micropuncture needle.  The microwire was advanced into the iliac arterial system.  The needle was exchanged for a microsheath, which was loaded into the common femoral artery over the wire.  The microwire was exchanged for a [facility] wire which was advanced into the aorta.  The microsheath was then exchanged for a 5-Fr sheath which was loaded into the common femoral artery.  The Omniflush catheter was then loaded over the wire up to the level of L1.  The catheter was connected to the power injector circuit.  After de-airring and de-clotting the circuit, a power injector aortogram was completed.  I then pulled the catheter down to the pelvis and did RAO and LAO pelvic angiograms to try to discern better the anatomy in the pelvis.  I replaced the wire into the catheter, straightening out the crook in the catheter.  Both were removed from the sheath together.  The sheath was aspirated.  No clots were present and the sheath was reloaded with heparinized saline.  The left femoral sheath was connected to the power injector  circuit.  A left leg runoff was completed in stations.  Based on his angiogram, inline reconstruction with left femoral to tibioperoneal trunk bypass might be possible.  Given this patient's multiple co-morbidities, it is not clear to me that he could survive a surgical bypass.  Additionally, he has substantial abnormal anatomy in the  pelvis that will require a CTA Abd/pelvis to fully evaluate his reconstruction options.  COMPLICATIONS: none  CONDITION: stable  Adele Barthel, MD [facility] [place] Office: [phone] Pager: [phone]  08/18/2013, 1:11 PM

## 2013-08-18 NOTE — Interval H&P Note (Signed)
[facility] [place]  History and Physical Update  The patient was interviewed and re-examined.  The patient's previous History and Physical has been reviewed and is unchanged from Dr. Evelena Leyden  consult.  There is no change in the plan of care: Left leg angiogram, possible intervention. I discussed with the patient the nature of angiographic procedures, especially the limited patencies of any endovascular intervention.  The patient is aware of that the risks of an angiographic procedure include but are not limited to: bleeding, infection, access site complications, renal failure, embolization, rupture of vessel, dissection, possible need for emergent surgical intervention, possible need for surgical procedures to treat the patient's pathology, anaphylactic reaction to contrast, and stroke and death.  The patient is aware of the risks and agrees to proceed.  Adele Barthel, MD [facility] [place] Office: [phone] Pager: [phone]  08/18/2013, 11:48 AM

## 2013-08-18 NOTE — Progress Notes (Signed)
ANTIBIOTIC CONSULT NOTE-FOLLOW UP Vancomycin Indication: Osteomyelitis  Vancomycin trough = 12.4 on 750 mg IV q24 hrs Vanc trough goal= 15-20  The vancomycin trough is a little below the desired range. Will increase the dose to 500 mg IV q 12 hrs. Will recheck trough when appropriate.  Arrie Senate, PharmD

## 2013-08-18 NOTE — H&P (View-Only) (Signed)
[facility] Consult  Reason for Consult:  Non healing toe ulcer with osteomyelitis Referring Physician:  Goodrich MRN #:  [id number]  History of Present Illness: This is a 68 y.o. male who presented back in December with septic shock and ischemic feet.  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   ABI on the left at that time was 0.5.    He presented to [place] ER with concerns of osteomyelitis in his left great toe after he had a toenail removed.  He was placed on ABx.  Plain film revealed osteomyelitis and MRI confirmed it.  He was placed on Vanc/Zosyn.  He was also found to be in AKI with a creatinine of 1.85 and BUN of 79 as well as hyperkalemic with a K+ of 5.7 on admission.    He is transferred to [facility] for further intervention.  He is on a beta blocker and ACEI for HTN.  He does have a hx of etoh abuse, but has not drank since Christmas.  He does continue to smoke about a pack a day.  He does have a hx of CAD that is medically managed.  He also has hx of moderate to severe AI.  Past Medical History  Diagnosis Date  . HTN (hypertension)   . Smoking hx 03/25/2013  . Alcohol abuse 03/25/2013  . CHF (congestive heart failure) 03/25/2013   Past Surgical History  Procedure Laterality Date  . Above knee leg amputation Right   . Amputation Right 03/26/2013    Procedure: AMPUTATION ABOVE KNEE;  Surgeon: Todd F Early, MD;  Location: [facility] OR;  Service: Vascular;  Laterality: Right;    No Known Allergies  Prior to Admission medications   Medication Sig Start Date End Date Taking? Authorizing Provider  aspirin EC 81 MG tablet Take 81 mg by mouth daily.   Yes Historical Provider, MD  atorvastatin (LIPITOR) 40 MG tablet Take 40 mg by mouth every evening. 04/07/13  Yes Costin M Gherghe, MD  carvedilol (COREG) 6.25 MG tablet Take 1 tablet (6.25 mg total) by mouth 2 (two) times daily with a meal. 04/07/13  Yes Costin M Gherghe, MD    escitalopram (LEXAPRO) 5 MG tablet Take 5 mg by mouth daily. 07/31/13  Yes Historical Provider, MD  folic acid (FOLVITE) 1 MG tablet Take 1 tablet (1 mg total) by mouth daily. 04/07/13  Yes Costin M Gherghe, MD  furosemide (LASIX) 40 MG tablet Take 1 tablet (40 mg total) by mouth daily. 04/07/13  Yes Costin M Gherghe, MD  lisinopril (PRINIVIL,ZESTRIL) 5 MG tablet Take 5 mg by mouth daily. 04/07/13  Yes Costin M Gherghe, MD  pantoprazole (PROTONIX) 40 MG tablet Take 1 tablet (40 mg total) by mouth daily. 04/07/13  Yes Costin M Gherghe, MD  spironolactone (ALDACTONE) 25 MG tablet Take 25 mg by mouth daily. 04/07/13  Yes Costin M Gherghe, MD  vitamin C (ASCORBIC ACID) 500 MG tablet Take 500 mg by mouth daily.   Yes Historical Provider, MD  zinc sulfate 220 MG capsule Take 220 mg by mouth daily.   Yes Historical Provider, MD    History   Social History  . Marital Status: Single    Spouse Name: N/A    Number of Children: N/A  . Years of Education: N/A   Occupational History  . Not on file.   Social History Main Topics  . Smoking status: Current Some Day Smoker    Types: Cigarettes  .   Smokeless tobacco: Not on file  . Alcohol Use: Yes     Comment: everyday  . Drug Use: Yes    Special: Marijuana  . Sexual Activity: No   Other Topics Concern  . Not on file   Social History Narrative  . No narrative on file     History reviewed. No pertinent family history.  ROS: [x] Positive   [ ] Negative   [ ] All sytems reviewed and are negative  Cardiovascular: [] chest pain/pressure [] palpitations [] SOB lying flat [] DOE [] pain in legs while walking [] pain in legs at rest [] pain in legs at night [x] non-healing ulcers [] hx of DVT [] swelling in legs  Pulmonary: [] productive cough [] asthma/wheezing [] home O2  Neurologic: [] weakness in [] arms [] legs [] numbness in [] arms [] legs [] hx of CVA [] mini stroke []difficulty speaking or slurred speech [] temporary loss of  vision in one eye [] dizziness  Hematologic: [] hx of cancer [] bleeding problems [] problems with blood clotting easily  Endocrine:   [] diabetes [] thyroid disease  GI [] vomiting blood [] blood in stool  GU: [x] CKD/renal failure [] HD--[] M/W/F or [] T/T/S [] burning with urination [] blood in urine  Psychiatric: [] anxiety [] depression  Musculoskeletal: [] arthritis [] joint pain  Integumentary: [] rashes [] ulcers  Constitutional: [] fever [] chills   Physical Examination  Filed Vitals:   08/16/13 0700  BP: 100/59  Pulse: 64  Temp: 98.4 F (36.9 C)  Resp: 18   Body mass index is 19.18 kg/(m^2).  General:  WDWN in NAD Gait: Not observed HENT: WNL, normocephalic Eyes: Pupils equal Pulmonary: normal non-labored breathing, without Rales, rhonchi,  wheezing Cardiac: regular, without carotid bruits Abdomen: soft, NT/ND, no masses Skin: without rashes, with ulcer or left great toe Vascular Exam/Pulses:  Right Left  Radial 2+ (normal) 2+ (normal)  Ulnar Non palpable Non palpable  Femoral 3+ (hyperdynamic) 1+ (weak)-2+  Popliteal AKA Non palpable  DP AKA absent  PT AKA absent   Extremities: without ischemic changes, without Gangrene , without cellulitis; with open wounds; Left great toe with evidence of osteomyelitis Musculoskeletal: no muscle wasting or atrophy  Neurologic: A&O X 3; Appropriate Affect ; SENSATION: normal; MOTOR FUNCTION:  moving all extremities equally. Speech is fluent/normal Psychiatric:  Normal affect Lymph:  No inguinal lymphadenopathy   CBC    Component Value Date/Time   WBC 7.2 08/13/2013 2002   RBC 3.77* 08/13/2013 2002   HGB 10.5* 08/13/2013 2002   HCT 31.3* 08/13/2013 2002   PLT 261 08/13/2013 2002   MCV 83.0 08/13/2013 2002   MCH 27.9 08/13/2013 2002   MCHC 33.5 08/13/2013 2002   RDW 14.6 08/13/2013 2002   LYMPHSABS 2.6 08/13/2013 2002   MONOABS 0.5 08/13/2013 2002   EOSABS 0.3 08/13/2013 2002   BASOSABS 0.0 08/13/2013  2002    BMET    Component Value Date/Time   NA 140 08/15/2013 0508   K 4.6 08/15/2013 0508   CL 110 08/15/2013 0508   CO2 18* 08/15/2013 0508   GLUCOSE 99 08/15/2013 0508   BUN 44* 08/15/2013 0508   CREATININE 1.50* 08/15/2013 0508   CALCIUM 9.5 08/15/2013 0508   GFRNONAA 46* 08/15/2013 0508   GFRAA 53* 08/15/2013 0508    COAGS: Lab Results  Component Value Date   INR 1.25 04/02/2013   INR 2.12* 03/26/2013       Non-Invasive Vascular Imaging:  MRI 08/14/13 IMPRESSION:  1. Moderately motion degraded study.  2. In conjunction with prior plain films, MR findings are consistent  with osteomyelitis of the tip of the terminal phalanx of the great  toe.  Statin:  yes Beta Blocker:  yes Aspirin:  yes ACEI:  yes ARB:  no Other antiplatelets/anticoagulants:  no   ASSESSMENT: This is a 68 y.o. male with hx of PVD and admitted with osteomyelitis s/p toenail removal.  PLAN: -will plan for aortogram with possible intervention on Monday to evaluate LLE arterial anatomy.   Leontine Locket, PA-C [facility] [phone]  Agree with above Weak L femoral pulse Plan angio Monday per Dr [name] to see if intervention is possible

## 2013-08-18 NOTE — Progress Notes (Signed)
    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  His left foot demonstrates popliteal occlusion with reconstitution of a tibioperoneal trunk.  His multiple co-morbidities including CAD and cardiomyopathy may limit his surgical options.  I doubt he can survive an aortobifemoral bypass with a left fem-TPT bypass.    - continue IVF overnight to assist recovery from contrast load - Cardiology risk stratification  - CTA Abd/pelvis tomorrow to evaluate pelvic anatomy, assuming no CIN - BLE GSV mapping   Adele Barthel, MD [facility] [place] Office: [phone] Pager: [phone]  08/18/2013, 1:24 PM

## 2013-08-19 ENCOUNTER — Encounter (HOSPITAL_COMMUNITY): Payer: Self-pay | Admitting: Cardiology

## 2013-08-19 DIAGNOSIS — Z01810 Encounter for preprocedural cardiovascular examination: Secondary | ICD-10-CM

## 2013-08-19 LAB — BASIC METABOLIC PANEL
BUN: 19 mg/dL (ref 6–23)
CALCIUM: 9.1 mg/dL (ref 8.4–10.5)
CHLORIDE: 104 meq/L (ref 96–112)
CO2: 21 meq/L (ref 19–32)
Creatinine, Ser: 1.02 mg/dL (ref 0.50–1.35)
GFR calc Af Amer: 85 mL/min — ABNORMAL LOW (ref >90)
GFR calc non Af Amer: 73 mL/min — ABNORMAL LOW (ref >90)
GLUCOSE: 91 mg/dL (ref 70–99)
POTASSIUM: 4.4 meq/L (ref 3.7–5.3)
SODIUM: 137 meq/L (ref 137–147)

## 2013-08-19 MED ORDER — LISINOPRIL 2.5 MG PO TABS
2.5000 mg | ORAL_TABLET | Freq: Every day | ORAL | Status: DC
  Administered 2013-08-19 – 2013-08-21 (×3): 2.5 mg via ORAL
  Filled 2013-08-19 (×3): qty 1

## 2013-08-19 NOTE — Consult Note (Addendum)
Primary cardiologist: Mclean  HPI: 68 year old male with past medical history of cardiomyopathy and coronary artery disease for preoperative evaluation prior to peripheral vascular surgery. Patient had a cardiac catheterization in December of 2014. At that time he was found to have a 30% left main, 40% ostial LAD, 80-90% proximal LAD, 80% first diagonal, occluded small ramus, occluded small first marginal, occluded second marginal and a 50% circumflex. There was a 75% mid right coronary artery followed by a 75% lesion. Echocardiogram December 2014 showed an ejection fraction of 15%. There was moderate to severe aortic insufficiency. There was moderate to severe mitral regurgitation. There was biatrial enlargement and right ventricle was moderately dilated. There was severe tricuspid regurgitation. Cardiac MRI in December of 2014 showed an ejection fraction of 23%. There was some endocardial scar involving the anterolateral, lateral and inferior apical wall. No areas of nonviability. Picture most suggestive of nonischemic cardiomyopathy. There is a small to moderate pericardial effusion. Cardiomyopathy felt to mixed related to CAD and ETOH. Patient felt not to be a good candidate for coronary artery bypassing graft. PCI was considered but not clear patient would be compliant with antiplatelet medications. Patient treated medically. Patient was to followup in the CHF clinic but did not. Admitted on May 13 with osteomyelitis of first digit left lower extremity. Also found to have acute renal insufficiency. He has been seen by vascular surgery and we are asked to evaluate preoperatively prior to peripheral vascular surgery. Patient denies dyspnea, chest pain, palpitations or syncope.   Medications Prior to Admission  Medication Sig Dispense Refill  . aspirin EC 81 MG tablet Take 81 mg by mouth daily.      Marland Kitchen atorvastatin (LIPITOR) 40 MG tablet Take 40 mg by mouth every evening.      . carvedilol (COREG)  6.25 MG tablet Take 1 tablet (6.25 mg total) by mouth 2 (two) times daily with a meal.      . escitalopram (LEXAPRO) 5 MG tablet Take 5 mg by mouth daily.      . folic acid (FOLVITE) 1 MG tablet Take 1 tablet (1 mg total) by mouth daily.      . furosemide (LASIX) 40 MG tablet Take 1 tablet (40 mg total) by mouth daily.  30 tablet    . lisinopril (PRINIVIL,ZESTRIL) 5 MG tablet Take 5 mg by mouth daily.      . pantoprazole (PROTONIX) 40 MG tablet Take 1 tablet (40 mg total) by mouth daily.      Marland Kitchen spironolactone (ALDACTONE) 25 MG tablet Take 25 mg by mouth daily.      . vitamin C (ASCORBIC ACID) 500 MG tablet Take 500 mg by mouth daily.      Marland Kitchen zinc sulfate 220 MG capsule Take 220 mg by mouth daily.        No Known Allergies  Past Medical History  Diagnosis Date  . HTN (hypertension)   . Smoking hx 03/25/2013  . Alcohol abuse 03/25/2013  . CHF (congestive heart failure) 03/25/2013    Past Surgical History  Procedure Laterality Date  . Above knee leg amputation Right   . Amputation Right 03/26/2013    Procedure: AMPUTATION ABOVE KNEE;  Surgeon: Rosetta Posner, MD;  Location: [facility];  Service: Vascular;  Laterality: Right;    History   Social History  . Marital Status: Single    Spouse Name: N/A    Number of Children: N/A  . Years of Education: N/A   Occupational History  .  Not on file.   Social History Main Topics  . Smoking status: Current Some Day Smoker    Types: Cigarettes  . Smokeless tobacco: Not on file  . Alcohol Use: Yes     Comment: everyday  . Drug Use: Yes    Special: Marijuana  . Sexual Activity: No   Other Topics Concern  . Not on file   Social History Narrative  . No narrative on file    History reviewed. No pertinent family history.  ROS:  Complains of pain in first digit left lower extremity but no fevers or chills, productive cough, hemoptysis, dysphasia, odynophagia, melena, hematochezia, dysuria, hematuria, rash, seizure activity, orthopnea, PND,  pedal edema, claudication. Remaining systems are negative.  Physical Exam:   Blood pressure 110/52, pulse 79, temperature 98.1 F (36.7 C), temperature source Oral, resp. rate 20, height 5' 3"  (1.6 m), weight 111 lb 5.3 oz (50.5 kg), SpO2 99.00%.  General:  Well developed/frail in NAD Skin warm/dry Patient not depressed No peripheral clubbing Back-normal HEENT-normal/normal eyelids Neck supple/normal carotid upstroke bilaterally; no bruits; no JVD; no thyromegaly chest - CTA/ normal expansion CV - RRR/normal S1 and S2; no rubs or gallops;  PMI nondisplaced, 2/6 systolic murmur left sternal border Abdomen -NT/ND, no HSM, no mass, + bowel sounds, no bruit 2+ femoral pulses, no bruits Ext-no edema, no chords, Status post right AKA, no dorsalis pedis pulse palpated on the left, infected first digit left lower extremity Neuro-grossly nonfocal  ECG 08/13/2013-sinus rhythm, inferior lateral T-wave inversion  Results for orders placed during the hospital encounter of 08/13/13 (from the past 48 hour(s))  BASIC METABOLIC PANEL     Status: Abnormal   Collection Time    08/18/13  6:55 AM      Result Value Ref Range   Sodium 134 (*) 137 - 147 mEq/L   Potassium 4.3  3.7 - 5.3 mEq/L   Chloride 101  96 - 112 mEq/L   CO2 19  19 - 32 mEq/L   Glucose, Bld 91  70 - 99 mg/dL   BUN 24 (*) 6 - 23 mg/dL   Creatinine, Ser 1.25  0.50 - 1.35 mg/dL   Calcium 9.5  8.4 - 10.5 mg/dL   GFR calc non Af Amer 57 (*) >90 mL/min   GFR calc Af Amer 66 (*) >90 mL/min   Comment: (NOTE)     The eGFR has been calculated using the CKD EPI equation.     This calculation has not been validated in all clinical situations.     eGFR's persistently <90 mL/min signify possible Chronic Kidney     Disease.  CBC     Status: Abnormal   Collection Time    08/18/13  6:55 AM      Result Value Ref Range   WBC 7.1  4.0 - 10.5 K/uL   RBC 3.17 (*) 4.22 - 5.81 MIL/uL   Hemoglobin 8.7 (*) 13.0 - 17.0 g/dL   HCT 26.3 (*) 39.0 -  52.0 %   MCV 83.0  78.0 - 100.0 fL   MCH 27.4  26.0 - 34.0 pg   MCHC 33.1  30.0 - 36.0 g/dL   RDW 14.3  11.5 - 15.5 %   Platelets 227  150 - 400 K/uL  VANCOMYCIN, TROUGH     Status: None   Collection Time    08/18/13  6:13 PM      Result Value Ref Range   Vancomycin Tr 12.4  10.0 - 20.0 ug/mL  No results found.  Assessment/Plan 1 preoperative evaluation prior to peripheral vascular surgery-the patient has a documented severe cardiomyopathy, severe valvular heart disease, and severe coronary disease. He is not a good candidate for coronary artery bypassing graft. He is not having any cardiac symptoms including no dyspnea and no chest pain. It is also not clear he would be compliant with followup or cardiac medications if PCI pursued. He has a poor understanding of his present situation. Who I would therefore favor continued medical therapy. Given severity of cardiomyopathy and coronary disease he would be high risk for any procedure and I discussed this with him. If surgery is deemed absolutely necessary then would proceed realizing he is high risk. 2 cardiomyopathy- Patient is euvolemic on examination. He had significant prerenal azotemia at the time of admission which has improved. Continue to hold diuretics. Continue carvedilol. I will resume low-dose lisinopril. Titrate medications as tolerated by blood pressure. Follow renal function. 3 coronary artery disease-patient has severe coronary artery disease. Continue aspirin and statin. 4 tobacco abuse-patient counseled on discontinuing. 5 alcohol abuse-patient states he has not been drinking since December. 6 osteomyelitis-continue antibiotics. 7 peripheral vascular disease-management per vascular surgery. Continue aspirin and statin.   Kirk Ruths MD 08/19/2013, 10:04 AM

## 2013-08-19 NOTE — Progress Notes (Signed)
CT called regarding a note in CT to check BMET prior to CT scan. Dr. Scot Dock contacted and instructed to have CT done as BMET wnls. Patient informed that transport would be taking him to CT for scan of abdomen and pelvis area. Patient refused. Reviewed need for CT scan with patient for diagnosis and treatment. Patient pleasantly continued to refuse. CT notified and will notify Dr. [name] or PA in the morning.

## 2013-08-19 NOTE — Progress Notes (Signed)
Utilization Review Completed Larisha Vencill J. Dartha Rozzell, RN, BSN, NCM [phone]  

## 2013-08-19 NOTE — Progress Notes (Signed)
VASCULAR LAB PRELIMINARY  PRELIMINARY  PRELIMINARY  PRELIMINARY  Right Lower Extremity Vein Map    Right Great Saphenous Vein   Segment Diameter Comment  1. Origin 4.3 mm   2. High Thigh 3.84 mm   3. Mid Thigh 2.93 mm   4. Low Thigh 3.08 mm   5. At Knee mm   6. High Calf mm   7. Low Calf mm   8. Ankle mm    mm    mm    mm     Left Great Saphenous Vein   Segment Diameter Comment  1. Origin 5.45 mm   2. High Thigh 3.94 mm   3. Mid Thigh 4.45 mm   4. Low Thigh 3.78 mm   5. At Knee 3.32 mm   6. High Calf 2.41 mm   7. Low Calf mm   8. Ankle mm    mm    mm    mm      Nani Ravens, RVT 08/19/2013, 2:48 PM

## 2013-08-19 NOTE — Progress Notes (Addendum)
   Daily Progress Note  Assessment/Planning: POD #1 s/p Aortogram, B pelvic angiogram, LRo   Mental status: to date, I have yet to have a meaningful extended conversation with this patient, subsequently I have concerns about the baseline functional status of this patient.  If he does not have meaningful quality of life, I can't justify any major surgical procedure as there essentially will be no benefit.  To date, I have never seen family at bedside to help make a determination in regards to functional status.  Left great toe osteomyelitis: on IV abx, will need amputation of the great toe but I would obtain a toe pressure to determine if there is already adequate blood flow to the foot to heal the amputation.  Left popliteal artery occlusion: if toe pressure are inadequate, a CFA/SFA to tibioperoneal trunk bypass would be required to reconstruct inline flow to the left foot.    Bilateral iliac aneurysm: the pelvic angiogram suggests significant aneurysms of the iliac arteries but a CTA abd/pelvis will be needed to fully evaluate as the arteriogram is a lumenogram.  Check BMP today, if ok, proceed with CTA abd/pelvis today.  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  The CTA abd/pelvis should help with this evaluation.  CAD/Dilated cardiomyopathy: from his previous admission, a significant cardiac history is documented.  He will need risk stratification and optimization, possible PCI pre-intervention.  Unfortunately, his anatomy may force an aortobifemoral bypass to address the iliac artery aneurysms and persistent sciatic artery.    Alcohol related protein malnutrition: LFT and prealbumin ordered to help determine degree of malnutrition  Subjective  - 1 Day Post-Op  No complaints  Objective Filed Vitals:   08/18/13 2030 08/19/13 0029 08/19/13 0432  08/19/13 0733  BP:  99/54 102/63 110/52  Pulse:  80 87 79  Temp:  98.5 F (36.9 C) 98.2 F (36.8 C) 98.1 F (36.7 C)  TempSrc:  Oral Oral Oral  Resp:  18 20 20   Height:      Weight:  111 lb 5.3 oz (50.5 kg)    SpO2: 99% 95% 97% 99%    Intake/Output Summary (Last 24 hours) at 08/19/13 0812 Last data filed at 08/19/13 0434  Gross per 24 hour  Intake 1301.03 ml  Output    900 ml  Net 401.03 ml    PULM  CTAB CV  RRR GI  soft, NTND, RLQ fullness VASC  L groin: soft, no hematoma, no PSA L foot:  Black eschar in great toe bed, no other obvious ulcers or gangrene  Laboratory CBC    Component Value Date/Time   WBC 7.1 08/18/2013 0655   HGB 8.7* 08/18/2013 0655   HCT 26.3* 08/18/2013 0655   PLT 227 08/18/2013 0655    BMET    Component Value Date/Time   NA 134* 08/18/2013 0655   K 4.3 08/18/2013 0655   CL 101 08/18/2013 0655   CO2 19 08/18/2013 0655   GLUCOSE 91 08/18/2013 0655   BUN 24* 08/18/2013 0655   CREATININE 1.25 08/18/2013 0655   CALCIUM 9.5 08/18/2013 0655   GFRNONAA 57* 08/18/2013 [place] 66* 08/18/2013 0655   AM labs pending  Adele Barthel, MD [facility] Office: [phone] Pager: [phone]  08/19/2013, 8:12 AM

## 2013-08-19 NOTE — Progress Notes (Signed)
Patient ID: Rodney [name], male   DOB: 26-Jun-1945, 68 y.o.   MRN: [id number]  [facility] PROGRESS NOTE  Ayub Kirsh IDP:[id number] DOB: 10-09-1945 DOA: 08/13/2013 PCP: Rafael Bihari, MD  Brief narrative:  68 year old man with history of peripheral vascular disease was seen by his primary care physician 5/14 for "overgrown" left first toenail. The nail was removed, his physician was concerned for osteomyelitis and so sent the patient to the emergency department where plain films suggested osteomyelitis. He was admitted, placed on antibiotics. MRI obtained which confirmed osteomyelitis. He remained hemodynamically stable. Because of his history of peripheral vascular disease with ABI 0.57, case was discussed with vascular surgery Dr. Kellie Simmering. Patient may benefit from angiogram to define extent of disease and possibility of intervention to minimize the extent of amputation in the future. In addition, because of his severe cardiomyopathy, any surgical procedure would be deferred to a larger center.   Assessment/Plan:  1. Left great toe terminal phalanx osteomyelitis. No clinical change. Continue broad spectrum ABX Vancomycin and Zosyn. Appreciate VVS assistance, pt will likely need amputation, will continue to follow upon VSS surgery. 2. Acute renal failure with associated hyperkalemia. Likely prerenal azotemia complicated by diuretics and ACE inhibitor. Hyperkalemia resolved. Renal function now stable and Cr is WNL. Adequate urine output.  3. 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  4. Bilateral iliac aneurysm: the pelvic angiogram suggests significant aneurysms of the iliac arteries but a CTA abd/pelvis will be needed to fully evaluate as the arteriogram is a lumenogram. Pt refusing CT scan.  5. 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 The CTA abd/pelvis should help with this evaluation.  6. Chronic systolic congestive heart failure, moderate to severe aortic insufficiency, felt to be mixed in etiology (coronary artery disease and alcohol). Clinically compensated. Underwent cardiac catheterization 03/2013 which demonstrated diffuse three-vessel disease. Conservative management for now.  7. Anemia of chronic disease - no signs of active bleeding, slight drop in Hg since admission, CBC pending this AM.  8. Moderate malnutrition - secondary to progressive nature of vascular disease imposed on history of chronic alcohol and tobacco abuse, advance diet as pt able to tolerate  9. Tobacco dependence - discussed cessation in detail and pt verbalized  10. History of alcohol abuse - pt denies active use  Code Status: full code  DVT prophylaxis: heparin  Family Communication: none present  Disposition Plan: remains inpatient   Consultants:  Vascular surgery  Cardiology Antibiotics:  Zosyn 5/14 >>  Vancomycin 5/14 >> Procedures/Studies:  Mr Foot Left Wo Contrast 08/14/2013 Moderately motion degraded study, findings are consistent with osteomyelitis of the tip of the terminal phalanx of the great toe.   HPI/Subjective: No events overnight.   Objective: Filed Vitals:   08/19/13 0432 08/19/13 0733 08/19/13 1123 08/19/13 1624  BP: 102/63 110/52 107/42 99/51  Pulse: 87 79 75 75  Temp: 98.2 F (36.8 C) 98.1 F (36.7 C) 98.1 F (36.7 C) 98.2 F (36.8 C)  TempSrc: Oral Oral Oral Oral  Resp: 20 20 18 18   Height:      Weight:      SpO2: 97% 99% 98% 97%    Intake/Output Summary (Last 24 hours) at 08/19/13 1758 Last data filed at 08/19/13 1624  Gross per 24 hour  Intake 1573.75 ml  Output   1200 ml  Net 373.75 ml  Exam:   General:  Pt is alert, follows commands appropriately, not in acute distress  Refusing exam today   Data Reviewed: Basic Metabolic  Panel:  Recent Labs Lab 08/14/13 0514 08/15/13 0508 08/17/13 0619 08/18/13 0655 08/19/13 1031  NA 139 140 133* 134* 137  K 4.9 4.6 4.8 4.3 4.4  CL 110 110 101 101 104  CO2 17* 18* 20 19 21   GLUCOSE 117* 99 88 91 91  BUN 65* 44* 24* 24* 19  CREATININE 1.59* 1.50* 1.24 1.25 1.02  CALCIUM 9.5 9.5 9.9 9.5 9.1   CBC:  Recent Labs Lab 08/13/13 2002 08/17/13 0619 08/18/13 0655  WBC 7.2 6.6 7.1  NEUTROABS 3.8  --   --   HGB 10.5* 9.3* 8.7*  HCT 31.3* 27.8* 26.3*  MCV 83.0 82.5 83.0  PLT 261 239 227   Recent Results (from the past 240 hour(s))  MRSA PCR SCREENING     Status: None   Collection Time    08/14/13  3:40 AM      Result Value Ref Range Status   MRSA by PCR NEGATIVE  NEGATIVE Final   Comment:            The GeneXpert MRSA Assay (FDA     approved for NASAL specimens     only), is one component of a     comprehensive MRSA colonization     surveillance program. It is not     intended to diagnose MRSA     infection nor to guide or     monitor treatment for     MRSA infections.     Scheduled Meds: . aspirin EC  81 mg Oral Daily  . atorvastatin  40 mg Oral QPM  . carvedilol  6.25 mg Oral BID WC  . citalopram  20 mg Oral Daily  . enoxaparin (LOVENOX) injection  40 mg Subcutaneous Q24H  . folic acid  1 mg Oral Daily  . lisinopril  2.5 mg Oral Daily  . nicotine  21 mg Transdermal Daily  . piperacillin-tazobactam (ZOSYN)  IV  3.375 g Intravenous Q8H  . vancomycin  500 mg Intravenous Q12H  . vitamin C  500 mg Oral Daily  . zinc sulfate  220 mg Oral Daily   Continuous Infusions: . sodium chloride 75 mL/hr at 08/19/13 0400   Theodis Blaze, MD  [facility] Pager [phone]  If 7PM-7AM, please contact night-coverage [email or web address] Password [email or web address] 08/19/2013, 5:58 PM   LOS: 6 days

## 2013-08-20 DIAGNOSIS — M869 Osteomyelitis, unspecified: Secondary | ICD-10-CM | POA: Diagnosis not present

## 2013-08-20 DIAGNOSIS — I2510 Atherosclerotic heart disease of native coronary artery without angina pectoris: Secondary | ICD-10-CM

## 2013-08-20 LAB — CBC
HCT: 23.3 % — ABNORMAL LOW (ref 39.0–52.0)
HEMOGLOBIN: 7.7 g/dL — AB (ref 13.0–17.0)
MCH: 27.9 pg (ref 26.0–34.0)
MCHC: 33 g/dL (ref 30.0–36.0)
MCV: 84.4 fL (ref 78.0–100.0)
Platelets: 190 10*3/uL (ref 150–400)
RBC: 2.76 MIL/uL — AB (ref 4.22–5.81)
RDW: 14.8 % (ref 11.5–15.5)
WBC: 7.3 10*3/uL (ref 4.0–10.5)

## 2013-08-20 LAB — BASIC METABOLIC PANEL
BUN: 20 mg/dL (ref 6–23)
CO2: 21 meq/L (ref 19–32)
CREATININE: 1.21 mg/dL (ref 0.50–1.35)
Calcium: 9.2 mg/dL (ref 8.4–10.5)
Chloride: 105 mEq/L (ref 96–112)
GFR calc Af Amer: 69 mL/min — ABNORMAL LOW (ref >90)
GFR calc non Af Amer: 59 mL/min — ABNORMAL LOW (ref >90)
GLUCOSE: 91 mg/dL (ref 70–99)
Potassium: 4.3 mEq/L (ref 3.7–5.3)
Sodium: 137 mEq/L (ref 137–147)

## 2013-08-20 NOTE — Progress Notes (Addendum)
Chart reviewed.  Patient ID: Rodney [name], male   DOB: [birth year], 68 y.o.   MRN: [id number]  [facility] PROGRESS NOTE  Rodney [name] IRS:[id number] DOB: 06-03-1945 DOA: 08/13/2013 PCP: Rafael Bihari, MD  Brief narrative:  68 year old man with history of peripheral vascular disease was seen by his primary care physician 5/14 for "overgrown" left first toenail. The nail was removed, his physician was concerned for osteomyelitis and so sent the patient to the emergency department where plain films suggested osteomyelitis. He was admitted, placed on antibiotics. MRI obtained which confirmed osteomyelitis. He remained hemodynamically stable. Because of his history of peripheral vascular disease with ABI 0.57, case was discussed with vascular surgery Dr. Kellie Simmering. Patient may benefit from angiogram to define extent of disease and possibility of intervention to minimize the extent of amputation in the future.  Assessment/Plan:  1. Left great toe terminal phalanx osteomyelitis.refused CTA abd/pelvis until he speaks with cousin. If refuses tomorrow, discharge on IV abx. 2. Acute renal failure with associated hyperkalemia. resolved 3. 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  4. Bilateral iliac aneurysm: the pelvic angiogram suggests significant aneurysms of the iliac arteries but a CTA abd/pelvis will be needed to fully evaluate   5. Chronic systolic congestive heart failure, moderate to severe aortic insufficiency, compensated 6. Anemia of chronic disease -dropping further. Check hemoccults and anemia panel 7. Moderate malnutrition - secondary to progressive nature of vascular disease imposed on history of chronic alcohol and tobacco abuse, advance diet as pt able to tolerate  8. Tobacco dependence - discussed cessation in detail and pt verbalized  9. History of alcohol abuse - pt denies active use  Code Status: full code  DVT  prophylaxis: heparin  Family Communication: none present  Disposition Plan: remains inpatient   Consultants:  Vascular surgery  Cardiology Antibiotics:  Zosyn 5/14 >>  Vancomycin 5/14 >> Procedures/Studies:  Mr Foot Left Wo Contrast 08/14/2013 Moderately motion degraded study, findings are consistent with osteomyelitis of the tip of the terminal phalanx of the great toe.   HPI/Subjective: "dont cut on me"  Objective: Filed Vitals:   08/19/13 1624 08/19/13 2027 08/20/13 0450 08/20/13 1454  BP: 99/51 95/47 129/48 107/57  Pulse: 75 89 76 69  Temp: 98.2 F (36.8 C) 98.9 F (37.2 C) 98.8 F (37.1 C) 97.8 F (36.6 C)  TempSrc: Oral Oral Oral Oral  Resp: 18 18 18 18   Height:      Weight:   51.1 kg (112 lb 10.5 oz)   SpO2: 97% 99% 100% 97%    Intake/Output Summary (Last 24 hours) at 08/20/13 1858 Last data filed at 08/20/13 1700  Gross per 24 hour  Intake    720 ml  Output    675 ml  Net     45 ml    Exam:   General:  Pt is alert, follows commands appropriately, not in acute distress  CV RRR  Lungs CTA  abd S, NT  Ext with dark great toe on right  Data Reviewed: Basic Metabolic Panel:  Recent Labs Lab 08/15/13 0508 08/17/13 0619 08/18/13 0655 08/19/13 1031 08/20/13 0520  NA 140 133* 134* 137 137  K 4.6 4.8 4.3 4.4 4.3  CL 110 101 101 104 105  CO2 18* 20 19 21 21   GLUCOSE 99 88 91 91 91  BUN 44* 24* 24* 19 20  CREATININE 1.50* 1.24 1.25 1.02 1.21  CALCIUM 9.5 9.9 9.5 9.1 9.2   CBC:  Recent  Labs Lab 08/13/13 2002 08/17/13 0619 08/18/13 0655 08/20/13 0520  WBC 7.2 6.6 7.1 7.3  NEUTROABS 3.8  --   --   --   HGB 10.5* 9.3* 8.7* 7.7*  HCT 31.3* 27.8* 26.3* 23.3*  MCV 83.0 82.5 83.0 84.4  PLT 261 239 227 190   Recent Results (from the past 240 hour(s))  MRSA PCR SCREENING     Status: None   Collection Time    08/14/13  3:40 AM      Result Value Ref Range Status   MRSA by PCR NEGATIVE  NEGATIVE Final   Comment:            The GeneXpert  MRSA Assay (FDA     approved for NASAL specimens     only), is one component of a     comprehensive MRSA colonization     surveillance program. It is not     intended to diagnose MRSA     infection nor to guide or     monitor treatment for     MRSA infections.     Scheduled Meds: . aspirin EC  81 mg Oral Daily  . atorvastatin  40 mg Oral QPM  . carvedilol  6.25 mg Oral BID WC  . citalopram  20 mg Oral Daily  . enoxaparin (LOVENOX) injection  40 mg Subcutaneous Q24H  . folic acid  1 mg Oral Daily  . lisinopril  2.5 mg Oral Daily  . nicotine  21 mg Transdermal Daily  . piperacillin-tazobactam (ZOSYN)  IV  3.375 g Intravenous Q8H  . vancomycin  500 mg Intravenous Q12H  . vitamin C  500 mg Oral Daily  . zinc sulfate  220 mg Oral Daily   Continuous Infusions:   [name], MD  [facility] Pager [phone]  If 7PM-7AM, please contact night-coverage [email or web address] Password [facility] 08/20/2013, 6:58 PM   LOS: 7 days

## 2013-08-20 NOTE — Progress Notes (Signed)
RN spoke to patient contact/relative Rosalio Loud who states she will be here around noon tomorrow Demetrius Charity).  She is aware of Pt's refusal of CT scan and states "He doesn't understand things."  She is hopeful she will convince him to allow CT.  Will con't to facilitate plan of care best as able.

## 2013-08-20 NOTE — Progress Notes (Addendum)
    Subjective  - patient is comfortable with  no new complaints.   Objective 129/48 76 98.8 F (37.1 C) (Oral) 18 100%  Intake/Output Summary (Last 24 hours) at 08/20/13 0916 Last data filed at 08/20/13 0759  Gross per 24 hour  Intake    460 ml  Output   1275 ml  Net   -815 ml   CV RRR Vascular left groin soft NTTP Black eschar in great toe no change    Assessment/Planning: L great toe osteomyelitis - plan once studies completed   Rodney [name] 08/20/2013 9:16 AM --  Laboratory Lab Results:  Recent Labs  08/18/13 0655 08/20/13 0520  WBC 7.1 7.3  HGB 8.7* 7.7*  HCT 26.3* 23.3*  PLT 227 190   BMET  Recent Labs  08/19/13 1031 08/20/13 0520  NA 137 137  K 4.4 4.3  CL 104 105  CO2 21 21  GLUCOSE 91 91  BUN 19 20  CREATININE 1.02 1.21  CALCIUM 9.1 9.2    COAG Lab Results  Component Value Date   INR 1.25 04/02/2013   INR 2.12* 03/26/2013   No results found for this basename: PTT    Addendum  I have independently interviewed and examined the patient, and I agree with the physician assistant's findings.  Pt has not been cooperative with his care, refusing diagnostic studies and labs.  Along with the limited conservation I am having with this patient, I doubt he fully understands his medical situation.  At this point, cardiology is declining to even offer him a cardiac cath.  At this point, we are awaiting a CTA abd/pelvis and L toe pressure before determining his next study or intervention.    Adele Barthel, MD [facility] Office: [phone] Pager: [phone]  08/20/2013, 2:58 PM

## 2013-08-20 NOTE — Progress Notes (Signed)
Pt unreceptive to encouragement regarding CT scan this morning.  He wishes to wait until his cousin arrives tomorrow and he can discuss with them first.  CT dept aware.  Will con't plan of care as able.

## 2013-08-20 NOTE — Progress Notes (Signed)
Patient ID: Rodney [name], male   DOB: 31-Mar-1946, 68 y.o.   MRN: [id number]   SUBJECTIVE: Patient denies dyspnea or chest pain, lying flat.  Creatinine 1.2 today.   Scheduled Meds: . aspirin EC  81 mg Oral Daily  . atorvastatin  40 mg Oral QPM  . carvedilol  6.25 mg Oral BID WC  . citalopram  20 mg Oral Daily  . enoxaparin (LOVENOX) injection  40 mg Subcutaneous Q24H  . folic acid  1 mg Oral Daily  . lisinopril  2.5 mg Oral Daily  . nicotine  21 mg Transdermal Daily  . piperacillin-tazobactam (ZOSYN)  IV  3.375 g Intravenous Q8H  . vancomycin  500 mg Intravenous Q12H  . vitamin C  500 mg Oral Daily  . zinc sulfate  220 mg Oral Daily   Continuous Infusions: . sodium chloride 45 mL/hr at 08/19/13 2050   PRN Meds:.acetaminophen, HYDROcodone-acetaminophen, morphine injection, oxyCODONE-acetaminophen    Filed Vitals:   08/19/13 1123 08/19/13 1624 08/19/13 2027 08/20/13 0450  BP: 107/42 99/51 95/47  129/48  Pulse: 75 75 89 76  Temp: 98.1 F (36.7 C) 98.2 F (36.8 C) 98.9 F (37.2 C) 98.8 F (37.1 C)  TempSrc: Oral Oral Oral Oral  Resp: 18 18 18 18   Height:      Weight:    112 lb 10.5 oz (51.1 kg)  SpO2: 98% 97% 99% 100%    Intake/Output Summary (Last 24 hours) at 08/20/13 0843 Last data filed at 08/20/13 0759  Gross per 24 hour  Intake    820 ml  Output   1275 ml  Net   -455 ml    LABS: Basic Metabolic Panel:  Recent Labs  08/19/13 1031 08/20/13 0520  NA 137 137  K 4.4 4.3  CL 104 105  CO2 21 21  GLUCOSE 91 91  BUN 19 20  CREATININE 1.02 1.21  CALCIUM 9.1 9.2   Liver Function Tests: No results found for this basename: AST, ALT, ALKPHOS, BILITOT, PROT, ALBUMIN,  in the last 72 hours No results found for this basename: LIPASE, AMYLASE,  in the last 72 hours CBC:  Recent Labs  08/18/13 0655 08/20/13 0520  WBC 7.1 7.3  HGB 8.7* 7.7*  HCT 26.3* 23.3*  MCV 83.0 84.4  PLT 227 190   Cardiac Enzymes: No results found for this basename: CKTOTAL, CKMB,  CKMBINDEX, TROPONINI,  in the last 72 hours BNP: No components found with this basename: POCBNP,  D-Dimer: No results found for this basename: DDIMER,  in the last 72 hours Hemoglobin A1C: No results found for this basename: HGBA1C,  in the last 72 hours Fasting Lipid Panel: No results found for this basename: CHOL, HDL, LDLCALC, TRIG, CHOLHDL, LDLDIRECT,  in the last 72 hours Thyroid Function Tests: No results found for this basename: TSH, T4TOTAL, FREET3, T3FREE, THYROIDAB,  in the last 72 hours Anemia Panel: No results found for this basename: VITAMINB12, FOLATE, FERRITIN, TIBC, IRON, RETICCTPCT,  in the last 72 hours  RADIOLOGY: Mr Foot Left Wo Contrast  08/14/2013   CLINICAL DATA:  Osteomyelitis of the great toe.  EXAM: MRI OF THE LEFT FOREFOOT WITHOUT CONTRAST  TECHNIQUE: Multiplanar, multisequence MR imaging was performed. No intravenous contrast was administered.  COMPARISON:  Radiographs 08/13/2013.  FINDINGS: Patient was unable to remain still for scanning and therefore the images are motion degraded. Despite the image degradation, there is edema and effacement of normal fatty marrow in the terminal phalanx of the great toe consistent with osteomyelitis.  There may be a thin abscess over the dorsum of the terminal phalanx also with fluid signal in this region. The other toes appear within normal limits. Metatarsals appear intact. First MTP joint osteoarthritis is present.  IMPRESSION: 1. Moderately motion degraded study. 2. In conjunction with prior plain films, MR findings are consistent with osteomyelitis of the tip of the terminal phalanx of the great toe.   Electronically Signed   By: Dereck Ligas M.D.   On: 08/14/2013 12:28   Dg Toe Great Left  08/13/2013   CLINICAL DATA:  Possible infection  EXAM: LEFT GREAT TOE  COMPARISON:  None.  FINDINGS: Three views of left great toe submitted. There is cortical irregularity and subtle erosion at the tip of distal phalanx suspicious for  osteomyelitis. . Clinical correlation is necessary. Further correlation with MRI could be performed. No acute fracture or subluxation.  IMPRESSION: There is cortical irregularity and subtle erosion at the tip of distal phalanx suspicious for osteomyelitis. . Clinical correlation is necessary. Further correlation with MRI could be performed.   Electronically Signed   By: Lahoma Crocker M.D.   On: 08/13/2013 20:17    PHYSICAL EXAM General: NAD Neck: No JVD, no thyromegaly or thyroid nodule.  Lungs: Clear to auscultation bilaterally with normal respiratory effort. CV: Nondisplaced PMI.  Heart regular S1/S2, no S3/S4, 2/6 HSM LLSB/apex.  No peripheral edema.  No carotid bruit.  Normal pedal pulses.  Abdomen: Soft, nontender, no hepatosplenomegaly, no distention.  Neurologic: Alert and oriented x 3.  Psych: Normal affect. Extremities: s/p right AKA.  Left great toe with ulcer/osteomyelitis.   TELEMETRY: Reviewed telemetry pt in NSR  ASSESSMENT AND PLAN: 68 yo with history of CAD, ischemic cardiomyopathy, ETOH abuse, PAD s/p AKA presented with left great toe osteomyelitis.  1. CAD: LHC 12/14 with severe 3VD.  He was thought to be a poor CABG candidate.  He was medically managed given concerns about his compliance with antiplatelet meds.  He denies further chest pain.  He is now in a nursing home, so compliance may be less of an issue. Continue ASA 81 and statin.  2. Chronic systolic CHF: EF 29% on echo in 12/14 with moderately dilated RV, moderate to severe MR and AI, severe TR.  He was thought to have combination of ischemic and ETOH cardiomyopathy.  On exam, he looks euvolemic. He no longer drinks ETOH.  - Initially with elevated creatinine.  Lasix held.  I do not, however, think he needs further IV fluid so will stop.  Can continue to hold Lasix for now.  - Continue Coreg, lisinopril.  If creatinine remains stable, will increase lisinopril back to 5 mg daily.  - He never followed up at CHF clinic after  discharge in 1/15 though it appears that he continued his medications (was in SNF).  Will try to arrange followup again.  3. Osteomyelitis: May need amputation of toe.  Per vascular surgery.  He is at high risk of surgical complications given unrevascularized CAD and cardiomyopathy but appears euvolemic and no chest pain.  May proceed with surgery if absolutely necessary (which it may be).    Larey Dresser 08/20/2013 8:51 AM

## 2013-08-21 ENCOUNTER — Inpatient Hospital Stay (HOSPITAL_COMMUNITY): Payer: Medicare Other

## 2013-08-21 LAB — BASIC METABOLIC PANEL
BUN: 21 mg/dL (ref 6–23)
CHLORIDE: 103 meq/L (ref 96–112)
CO2: 22 mEq/L (ref 19–32)
Calcium: 9.1 mg/dL (ref 8.4–10.5)
Creatinine, Ser: 1.18 mg/dL (ref 0.50–1.35)
GFR calc Af Amer: 71 mL/min — ABNORMAL LOW (ref >90)
GFR calc non Af Amer: 61 mL/min — ABNORMAL LOW (ref >90)
Glucose, Bld: 92 mg/dL (ref 70–99)
Potassium: 4.3 mEq/L (ref 3.7–5.3)
Sodium: 136 mEq/L — ABNORMAL LOW (ref 137–147)

## 2013-08-21 LAB — CBC
HCT: 23.3 % — ABNORMAL LOW (ref 39.0–52.0)
Hemoglobin: 7.7 g/dL — ABNORMAL LOW (ref 13.0–17.0)
MCH: 28 pg (ref 26.0–34.0)
MCHC: 33 g/dL (ref 30.0–36.0)
MCV: 84.7 fL (ref 78.0–100.0)
Platelets: 208 10*3/uL (ref 150–400)
RBC: 2.75 MIL/uL — ABNORMAL LOW (ref 4.22–5.81)
RDW: 14.8 % (ref 11.5–15.5)
WBC: 7 10*3/uL (ref 4.0–10.5)

## 2013-08-21 LAB — IRON AND TIBC
Iron: 73 ug/dL (ref 42–135)
SATURATION RATIOS: 43 % (ref 20–55)
TIBC: 170 ug/dL — AB (ref 215–435)
UIBC: 97 ug/dL — ABNORMAL LOW (ref 125–400)

## 2013-08-21 LAB — C-REACTIVE PROTEIN: CRP: 0.7 mg/dL — AB (ref <0.60)

## 2013-08-21 LAB — PREALBUMIN: Prealbumin: 14 mg/dL — ABNORMAL LOW (ref 17.0–34.0)

## 2013-08-21 LAB — FOLATE: Folate: >20 ng/mL

## 2013-08-21 LAB — FERRITIN: Ferritin: 386 ng/mL — ABNORMAL HIGH (ref 22–322)

## 2013-08-21 LAB — VANCOMYCIN, TROUGH: VANCOMYCIN TR: 18 ug/mL (ref 10.0–20.0)

## 2013-08-21 LAB — ALBUMIN: ALBUMIN: 3 g/dL — AB (ref 3.5–5.2)

## 2013-08-21 LAB — VITAMIN B12: VITAMIN B 12: 265 pg/mL (ref 211–911)

## 2013-08-21 MED ORDER — VITAMIN B-1 100 MG PO TABS
100.0000 mg | ORAL_TABLET | Freq: Every day | ORAL | Status: DC
  Administered 2013-08-22: 100 mg via ORAL
  Filled 2013-08-21 (×2): qty 1

## 2013-08-21 MED ORDER — LISINOPRIL 5 MG PO TABS
5.0000 mg | ORAL_TABLET | Freq: Every day | ORAL | Status: DC
  Filled 2013-08-21: qty 1

## 2013-08-21 MED ORDER — IOHEXOL 350 MG/ML SOLN
100.0000 mL | Freq: Once | INTRAVENOUS | Status: AC | PRN
  Administered 2013-08-21: 100 mL via INTRAVENOUS

## 2013-08-21 NOTE — Progress Notes (Signed)
Pt refused transport to vascular lab stating he wishes to wait for his cousin to get here.  RN explained the procedure and the indication, reinforced by transporter, however patient still refused.  Unclear if he truly understands but he is clearly fearful.  Will alert all relevant parties after cousin arrival if and when Pt is agreeable to further diagnostics.

## 2013-08-21 NOTE — Progress Notes (Signed)
Clinical Social Work Department BRIEF PSYCHOSOCIAL ASSESSMENT 08/21/2013  Patient:  Rodney [name], Rodney [name]     Account Number:  [id number]     Admit date:  08/13/2013  Clinical Social Worker:  Megan Salon  Date/Time:  08/21/2013 04:29 PM  Referred by:  Physician  Date Referred:  08/21/2013 Referred for  ALF Placement   Other Referral:   Interview type:  Other - See comment Other interview type:   CSW spoke to patient and attempted to call patient's cousin    PSYCHOSOCIAL DATA Living Status:  FACILITY Admitted from facility:  Other Level of care:  Family Care Home Primary support name:  Edison Simon Primary support relationship to patient:  FAMILY Degree of support available:   Okay    CURRENT CONCERNS Current Concerns  Post-Acute Placement   Other Concerns:    SOCIAL WORK ASSESSMENT / PLAN CSW met with patient and introduced self and explained reason for visit. Patient states he is from a [place] and referred social worker to his cousin, Edison Simon. CSW attempted to call Ms. Smith and left a voicemail. CSW is awaiting phone call back from cousin to confirm dc plans.   Assessment/plan status:  Psychosocial Support/Ongoing Assessment of Needs Other assessment/ plan:   Information/referral to community resources:   CSW information    PATIENT'S/FAMILY'S RESPONSE TO PLAN OF CARE: Patient states he is from a rest home and asked social worker to speak with cousin.        Jeanette Caprice, MSW, [place]

## 2013-08-21 NOTE — Progress Notes (Signed)
Chart review complete.  Patient is not eligible for [facility] Care Management services because his/her PCP is not a [facility] primary care provider or is not [facility] affiliated.  For any additional questions or new referrals please contact Tim Henderson BSN RN [facility] Liaison at [phone] °

## 2013-08-21 NOTE — Progress Notes (Signed)
ANTIBIOTIC CONSULT NOTE - FOLLOW UP  Pharmacy Consult for Vancomycin Indication: osteomyelitis  No Known Allergies  Patient Measurements: Height: 5\' 3"  (160 cm) Weight: 103 lb 13.4 oz (47.1 kg) IBW/kg (Calculated) : 56.9 Adjusted Body Weight:   Vital Signs: Temp: 98.5 F (36.9 C) (05/21 1458) Temp src: Oral (05/21 1458) BP: 98/77 mmHg (05/21 1458) Pulse Rate: 77 (05/21 1458) Intake/Output from previous day: 05/20 0701 - 05/21 0700 In: 840 [P.O.:840] Out: 1425 [Urine:1425] Intake/Output from this shift:    Labs:  Recent Labs  08/19/13 1031 08/20/13 0520 08/21/13 0430  WBC  --  7.3 7.0  HGB  --  7.7* 7.7*  PLT  --  190 208  CREATININE 1.02 1.21 1.18   Estimated Creatinine Clearance: 39.4 ml/min (by C-G formula based on Cr of 1.18).  Recent Labs  08/21/13 1730  [place] 18.0     Microbiology: Recent Results (from the past 720 hour(s))  MRSA PCR SCREENING     Status: None   Collection Time    08/14/13  3:40 AM      Result Value Ref Range Status   MRSA by PCR NEGATIVE  NEGATIVE Final   Comment:            The GeneXpert MRSA Assay (FDA     approved for NASAL specimens     only), is one component of a     comprehensive MRSA colonization     surveillance program. It is not     intended to diagnose MRSA     infection nor to guide or     monitor treatment for     MRSA infections.    Anti-infectives   Start     Dose/Rate Route Frequency Ordered Stop   08/19/13 0600  vancomycin (VANCOCIN) 500 mg in sodium chloride 0.9 % 100 mL IVPB     500 mg 100 mL/hr over 60 Minutes Intravenous Every 12 hours 08/18/13 1959     08/18/13 2100  vancomycin (VANCOCIN) 500 mg in sodium chloride 0.9 % 100 mL IVPB     500 mg 100 mL/hr over 60 Minutes Intravenous  Once 08/18/13 2055 08/18/13 2232   08/17/13 0000  piperacillin-tazobactam (ZOSYN) IVPB 3.375 g     3.375 g 12.5 mL/hr over 240 Minutes Intravenous Every 8 hours 08/16/13 2256     08/15/13 1800  vancomycin  (VANCOCIN) IVPB 750 mg/150 ml premix  Status:  Discontinued     750 mg 150 mL/hr over 60 Minutes Intravenous Every 24 hours 08/15/13 1125 08/18/13 1958   08/14/13 1800  vancomycin (VANCOCIN) 500 mg in sodium chloride 0.9 % 100 mL IVPB  Status:  Discontinued     500 mg 100 mL/hr over 60 Minutes Intravenous Every 24 hours 08/14/13 0250 08/15/13 1125   08/14/13 0600  piperacillin-tazobactam (ZOSYN) IVPB 3.375 g  Status:  Discontinued     3.375 g 100 mL/hr over 30 Minutes Intravenous 3 times per day 08/14/13 0239 08/14/13 0240   08/14/13 0400  piperacillin-tazobactam (ZOSYN) IVPB 3.375 g  Status:  Discontinued     3.375 g 12.5 mL/hr over 240 Minutes Intravenous Every 8 hours 08/14/13 0239 08/16/13 2256   08/14/13 0300  vancomycin (VANCOCIN) 500 mg in sodium chloride 0.9 % 100 mL IVPB     500 mg 100 mL/hr over 60 Minutes Intravenous  Once 08/14/13 0250 08/14/13 0434   08/14/13 0232  piperacillin-tazobactam (ZOSYN) IVPB 3.375 g  Status:  Discontinued     3.375 g 100 mL/hr over  30 Minutes Intravenous 3 times per day 08/14/13 0232 08/14/13 0237      Assessment: 68yo male with osteomyelitis of L-great toe.  Vanc Trough is 18, within goal range after dose adjustment for a trough below goal on 5/18.  Cr has been stable.  Goal of Therapy:  Vancomycin trough level 15-20 mcg/ml  Plan:  1-  Continue Vancomycin 500mg  IV q12 2-  Watch renal fxn closely and repeat levels weekly or as indicated.  Gracy Bruins, PharmD Clinical Pharmacist [facility]

## 2013-08-21 NOTE — Progress Notes (Signed)
Patient ID: Rodney [name], male   DOB: 29-Jun-1945, 68 y.o.   MRN: [id number]  [facility] PROGRESS NOTE  Rodney [name] KVQ:[id number] DOB: 1945-07-20 DOA: 08/13/2013 PCP: Rafael Bihari, MD  Brief narrative:  68 year old man with history of peripheral vascular disease was seen by his primary care physician 5/14 for "overgrown" left first toenail. The nail was removed, his physician was concerned for osteomyelitis and so sent the patient to the emergency department where plain films suggested osteomyelitis. He was admitted, placed on antibiotics. MRI obtained which confirmed osteomyelitis. He remained hemodynamically stable. Because of his history of peripheral vascular disease with ABI 0.57, case was discussed with vascular surgery Dr. Kellie Simmering. Patient may benefit from angiogram to define extent of disease and possibility of intervention to minimize the extent of amputation in the future.  Assessment/Plan:  1. Left great toe terminal phalanx osteomyelitis. Has finally agreed to CT scan. Await VVS recs 2. Acute renal failure with associated hyperkalemia. resolved 3. 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  4. Bilateral iliac aneurysm: the pelvic angiogram suggests significant aneurysms of the iliac arteries but a CTA abd/pelvis will be needed to fully evaluate   5. Chronic systolic congestive heart failure, moderate to severe aortic insufficiency, compensated 6. Anemia of chronic disease -dropping further. Check hemoccults and anemia panel 7. Moderate malnutrition - secondary to progressive nature of vascular disease imposed on history of chronic alcohol and tobacco abuse, advance diet as pt able to tolerate  8. Tobacco dependence - discussed cessation in detail and pt verbalized  9. History of alcohol abuse - pt denies active use  Code Status: full code  DVT prophylaxis: heparin  Family Communication: none present   Disposition Plan: remains inpatient   Consultants:  Vascular surgery  Cardiology Antibiotics:  Zosyn 5/14 >>  Vancomycin 5/14 >> Procedures/Studies:  Mr Foot Left Wo Contrast 08/14/2013 Moderately motion degraded study, findings are consistent with osteomyelitis of the tip of the terminal phalanx of the great toe.   HPI/Subjective: No complaints  Objective: Filed Vitals:   08/20/13 1938 08/21/13 0500 08/21/13 0517 08/21/13 1458  BP: 108/59  109/56 98/77  Pulse: 111  69 77  Temp: 98.7 F (37.1 C)  98.8 F (37.1 C) 98.5 F (36.9 C)  TempSrc: Oral  Oral Oral  Resp: 18  18 16   Height:      Weight:  47.1 kg (103 lb 13.4 oz)    SpO2: 100%  100% 100%    Intake/Output Summary (Last 24 hours) at 08/21/13 2044 Last data filed at 08/21/13 1300  Gross per 24 hour  Intake    240 ml  Output   1850 ml  Net  -1610 ml    Exam:   General:  Asleep. arousable  CV RRR  Lungs CTA  abd S, NT  Ext with dark great toe on right  Data Reviewed: Basic Metabolic Panel:  Recent Labs Lab 08/17/13 0619 08/18/13 0655 08/19/13 1031 08/20/13 0520 08/21/13 0430  NA 133* 134* 137 137 136*  K 4.8 4.3 4.4 4.3 4.3  CL 101 101 104 105 103  CO2 20 19 21 21 22   GLUCOSE 88 91 91 91 92  BUN 24* 24* 19 20 21   CREATININE 1.24 1.25 1.02 1.21 1.18  CALCIUM 9.9 9.5 9.1 9.2 9.1   CBC:  Recent Labs Lab 08/17/13 0619 08/18/13 0655 08/20/13 0520 08/21/13 0430  WBC 6.6 7.1 7.3 7.0  HGB 9.3* 8.7* 7.7* 7.7*  HCT  27.8* 26.3* 23.3* 23.3*  MCV 82.5 83.0 84.4 84.7  PLT 239 227 190 208   Recent Results (from the past 240 hour(s))  MRSA PCR SCREENING     Status: None   Collection Time    08/14/13  3:40 AM      Result Value Ref Range Status   MRSA by PCR NEGATIVE  NEGATIVE Final   Comment:            The GeneXpert MRSA Assay (FDA     approved for NASAL specimens     only), is one component of a     comprehensive MRSA colonization     surveillance program. It is not     intended to  diagnose MRSA     infection nor to guide or     monitor treatment for     MRSA infections.     Scheduled Meds: . aspirin EC  81 mg Oral Daily  . atorvastatin  40 mg Oral QPM  . carvedilol  6.25 mg Oral BID WC  . citalopram  20 mg Oral Daily  . enoxaparin (LOVENOX) injection  40 mg Subcutaneous Q24H  . folic acid  1 mg Oral Daily  . [START ON 08/22/2013] lisinopril  5 mg Oral Daily  . nicotine  21 mg Transdermal Daily  . piperacillin-tazobactam (ZOSYN)  IV  3.375 g Intravenous Q8H  . thiamine  100 mg Oral Daily  . vancomycin  500 mg Intravenous Q12H  . vitamin C  500 mg Oral Daily  . zinc sulfate  220 mg Oral Daily   Continuous Infusions:   Rodney Redwood, MD  [facility] Pager [phone]  If 7PM-7AM, please contact night-coverage [email or web address] Password [facility] 08/21/2013, 8:44 PM   LOS: 8 days

## 2013-08-22 DIAGNOSIS — Z9119 Patient's noncompliance with other medical treatment and regimen: Secondary | ICD-10-CM

## 2013-08-22 DIAGNOSIS — I70269 Atherosclerosis of native arteries of extremities with gangrene, unspecified extremity: Secondary | ICD-10-CM

## 2013-08-22 DIAGNOSIS — Z91199 Patient's noncompliance with other medical treatment and regimen due to unspecified reason: Secondary | ICD-10-CM

## 2013-08-22 MED ORDER — HYDROCODONE-ACETAMINOPHEN 5-325 MG PO TABS: 1.0000 | ORAL_TABLET | Freq: Four times a day (QID) | ORAL | Status: DC | PRN

## 2013-08-22 MED ORDER — ACETAMINOPHEN 325 MG PO TABS: 650.0000 mg | ORAL_TABLET | Freq: Four times a day (QID) | ORAL | Status: DC | PRN

## 2013-08-22 MED ORDER — SULFAMETHOXAZOLE-TMP DS 800-160 MG PO TABS: 1.0000 | ORAL_TABLET | Freq: Two times a day (BID) | ORAL | Status: DC

## 2013-08-22 NOTE — Progress Notes (Addendum)
Update: Per MD, patient is ready for dc and [facility] is aware and making arrangements to pick patient up. CSW notified patient and patients cousin. Both are in agreement. CSW signing off at this time.  CSW spoke to patient's cousin over the phone and cousin states that the plan is for patient to return back to his [facility] today in [place], [place]. CSW contacted [facility] to make them aware that patient will return back when ready.  Jeanette Caprice, MSW, [place]

## 2013-08-22 NOTE — Discharge Instructions (Signed)
Heart Failure Heart failure means your heart has trouble pumping blood. This makes it hard for your body to work well. Heart failure is usually a long-term (chronic) condition. You must take good care of yourself and follow your doctor's treatment plan. HOME CARE  Take your heart medicine as told by your doctor.  Do not stop taking medicine unless your doctor tells you to.  Do not skip any dose of medicine.  Refill your medicines before they run out.  Take other medicines only as told by your doctor or pharmacist.  Stay active if told by your doctor. The elderly and people with severe heart failure should talk with a doctor about physical activity.  Eat heart healthy foods. Choose foods that are without trans fat and are low in saturated fat, cholesterol, and salt (sodium). This includes fresh or frozen fruits and vegetables, fish, lean meats, fat-free or low-fat dairy foods, whole grains, and high-fiber foods. Lentils and dried peas and beans (legumes) are also good choices.  Limit salt if told by your doctor.  Cook in a healthy way. Roast, grill, broil, bake, poach, steam, or stir-fry foods.  Limit fluids as told by your doctor.  Weigh yourself every morning. Do this after you pee (urinate) and before you eat breakfast. Write down your weight to give to your doctor.  Take your blood pressure and write it down if your doctor tell you to.  Ask your doctor how to check your pulse. Check your pulse as told.  Lose weight if told by your doctor.  Stop smoking or chewing tobacco. Do not use gum or patches that help you quit without your doctor's approval.  Schedule and go to doctor visits as told.  Nonpregnant women should have no more than 1 drink a day. Men should have no more than 2 drinks a day. Talk to your doctor about drinking alcohol.  Stop illegal drug use.  Stay current with shots (immunizations).  Manage your health conditions as told by your doctor.  Learn to manage  your stress.  Rest when you are tired.  If it is really hot outside:  Avoid intense activities.  Use air conditioning or fans, or get in a cooler place.  Avoid caffeine and alcohol.  Wear loose-fitting, lightweight, and light-colored clothing.  If it is really cold outside:  Avoid intense activities.  Layer your clothing.  Wear mittens or gloves, a hat, and a scarf when going outside.  Avoid alcohol.  Learn about heart failure and get support as needed.  Get help to maintain or improve your quality of life and your ability to care for yourself as needed. GET HELP IF:   You gain 03 lb/1.4 kg or more in 1 day or 05 lb/2.3 kg in a week.  You are more short of breath than usual.  You cannot do your normal activities.  You tire easily.  You cough more than normal, especially with activity.  You have any or more puffiness (swelling) in areas such as your hands, feet, ankles, or belly (abdomen).  You cannot sleep because it is hard to breathe.  You feel like your heart is beating fast (palpitations).  You get dizzy or lightheaded when you stand up. GET HELP RIGHT AWAY IF:   You have trouble breathing.  There is a change in mental status, such as becoming less alert or not being able to focus.  You have chest pain or discomfort.  You faint. MAKE SURE YOU:   Understand these   instructions.  Will watch your condition.  Will get help right away if you are not doing well or get worse. Document Released: 12/28/2007 Document Revised: 07/15/2012 Document Reviewed: 10/19/2011 Exit[employer] Patient Information 2014 [employer].  

## 2013-08-22 NOTE — Discharge Summary (Signed)
Physician Discharge Summary  Rodney [name] [id number] DOB: 1945/08/25 DOA: 08/13/2013  PCP: Rafael Bihari, MD  Admit date: 08/13/2013 Discharge date: 08/22/2013  Time spent: greater than 30 minutes  Recommendations for Outpatient Follow-up:  1. Monitor BMET, CBC, toe wound  Discharge Diagnoses:  Principal Problem:   Osteomyelitis of toe of left great toe Active Problems:   Chronic systolic CHF (congestive heart failure)   Smoking hx   Severe protein-calorie malnutrition   AKI (acute kidney injury) anemia   Noncompliance Bilateral persistent sciatic artery, bilaterally occluded Previous right BKA  Discharge Condition: stable  Filed Weights   08/19/13 0029 08/20/13 0450 08/21/13 0500  Weight: 50.5 kg (111 lb 5.3 oz) 51.1 kg (112 lb 10.5 oz) 47.1 kg (103 lb 13.4 oz)    History of present illness:  68 y.o. male with hx of prior ETOH abuse, tobacco abuse, ABK right amputation, hx of CHF, HTN, on ACE I and 2 diuretics, hx of PVD, resides in group home, brought to his physician for a "overgrown" left toe nail. The nail was removed, and the physician said it was infected, and sent him to the ER for antibiotics. Evaluaiton in the ER included a plain film of the left great toe, which queried an osteomyelitis of the distal left great toe. Further, he was found to be in AKI, with Cr of 1.85, BUN of 79, and K of 5.7 mEq/L. His EKG showed no peaked Ts nor widen QRS. Hospitalist was asked to admit him for AKI, likely from meds and volume depletion, along with possible osteomyelitis.   Hospital Course:  Initially admitted to the hospitalists at [facility]. Started on Zosyn and vancomycin. Transferred from [facility] for vascular evaluation, given his history of peripheral vascular disease. Cardiology consulted for preop evaluation.  Aortogram was done. Showed aneurysmal iliac disease with persistent sciatic artery. Left foot demonstrated popliteal occlusion with  reconstitution of the tibial trunk. Unfortunately, patient was noncompliant with many tests and procedures. He refused blood draws on multiple occasions refused CT of the abdomen and pelvis but eventually consented. See results below. Refused obtaining a toe pressure. Dr. [name] has discussed possibly embolizing right persistent sciatic artery but patient declined. Toe amputation was offered but patient declined. Patient also declined PICC line for IV antibiotics. Discussed the case with ID who recommends 4-6 weeks of Bactrim. Patient voices understanding that he may eventually require a KA R. BKA, get septic, etc. No problems with acute heart failure during hospitalization. Stable for transfer back to group home.    Procedures:  arteriogram  Consultations:  Vascular surgery  cardiology  Discharge Exam: Filed Vitals:   08/22/13 0954  BP: 90/46  Pulse:   Temp:   Resp:     General: nontoxic. Alert and oriented Cardiovascular: RRR Respiratory: CTA Left toe dark. No odor or drainage  Discharge Instructions You were cared for by a hospitalist during your hospital stay. If you have any questions about your discharge medications or the care you received while you were in the hospital after you are discharged, you can call the unit and asked to speak with the hospitalist on call if the hospitalist that took care of you is not available. Once you are discharged, your primary care physician will handle any further medical issues. Please note that NO REFILLS for any discharge medications will be authorized once you are discharged, as it is imperative that you return to your primary care physician (or establish a relationship with a  primary care physician if you do not have one) for your aftercare needs so that they can reassess your need for medications and monitor your lab values.  Discharge Instructions   Activity as tolerated - No restrictions    Complete by:  As directed      Diet - low sodium  heart healthy    Complete by:  As directed             Medication List         acetaminophen 325 MG tablet  Commonly known as:  TYLENOL  Take 2 tablets (650 mg total) by mouth every 6 (six) hours as needed for mild pain.     aspirin EC 81 MG tablet  Take 81 mg by mouth daily.     atorvastatin 40 MG tablet  Commonly known as:  LIPITOR  Take 40 mg by mouth every evening.     carvedilol 6.25 MG tablet  Commonly known as:  COREG  Take 1 tablet (6.25 mg total) by mouth 2 (two) times daily with a meal.     escitalopram 5 MG tablet  Commonly known as:  LEXAPRO  Take 5 mg by mouth daily.     folic acid 1 MG tablet  Commonly known as:  FOLVITE  Take 1 tablet (1 mg total) by mouth daily.     furosemide 40 MG tablet  Commonly known as:  LASIX  Take 1 tablet (40 mg total) by mouth daily.     HYDROcodone-acetaminophen 5-325 MG per tablet  Commonly known as:  NORCO/VICODIN  Take 1 tablet by mouth every 6 (six) hours as needed for moderate pain.     lisinopril 5 MG tablet  Commonly known as:  PRINIVIL,ZESTRIL  Take 5 mg by mouth daily.     pantoprazole 40 MG tablet  Commonly known as:  PROTONIX  Take 1 tablet (40 mg total) by mouth daily.     spironolactone 25 MG tablet  Commonly known as:  ALDACTONE  Take 25 mg by mouth daily.     sulfamethoxazole-trimethoprim 800-160 MG per tablet  Commonly known as:  BACTRIM DS  Take 1 tablet by mouth 2 (two) times daily.     vitamin C 500 MG tablet  Commonly known as:  ASCORBIC ACID  Take 500 mg by mouth daily.     zinc sulfate 220 MG capsule  Take 220 mg by mouth daily.       No Known Allergies     Follow-up Information   Follow up with Loralie Champagne, MD On 08/28/2013. (Located at [facility] in the [place] on 1st. at 9:30. Garage Code [id number])    Specialty:  Cardiology   Contact information:   [address]  [place] [address] [phone]       Follow up with Rafael Bihari,  MD In 1 week.   Specialty:  Family Medicine   Contact information:   [address] [place] [address] [phone]        The results of significant diagnostics from this hospitalization (including imaging, microbiology, ancillary and laboratory) are listed below for reference.    Significant Diagnostic Studies: Mr Foot Left Wo Contrast  08/14/2013   CLINICAL DATA:  Osteomyelitis of the great toe.  EXAM: MRI OF THE LEFT FOREFOOT WITHOUT CONTRAST  TECHNIQUE: Multiplanar, multisequence MR imaging was performed. No intravenous contrast was administered.  COMPARISON:  Radiographs 08/13/2013.  FINDINGS: Patient was unable to remain  still for scanning and therefore the images are motion degraded. Despite the image degradation, there is edema and effacement of normal fatty marrow in the terminal phalanx of the great toe consistent with osteomyelitis. There may be a thin abscess over the dorsum of the terminal phalanx also with fluid signal in this region. The other toes appear within normal limits. Metatarsals appear intact. First MTP joint osteoarthritis is present.  IMPRESSION: 1. Moderately motion degraded study. 2. In conjunction with prior plain films, MR findings are consistent with osteomyelitis of the tip of the terminal phalanx of the great toe.   Electronically Signed   By: Dereck Ligas M.D.   On: 08/14/2013 12:28   Dg Toe Great Left  08/13/2013   CLINICAL DATA:  Possible infection  EXAM: LEFT GREAT TOE  COMPARISON:  None.  FINDINGS: Three views of left great toe submitted. There is cortical irregularity and subtle erosion at the tip of distal phalanx suspicious for osteomyelitis. . Clinical correlation is necessary. Further correlation with MRI could be performed. No acute fracture or subluxation.  IMPRESSION: There is cortical irregularity and subtle erosion at the tip of distal phalanx suspicious for osteomyelitis. . Clinical correlation is necessary. Further correlation with MRI could  be performed.   Electronically Signed   By: Lahoma Crocker M.D.   On: 08/13/2013 20:17   Ct Angio Abd/pel W/ And/or W/o  08/21/2013   CLINICAL DATA:  Bilateral iliac aneurysms.  EXAM: CT ANGIOGRAPHY ABDOMEN AND PELVIS  TECHNIQUE: Multidetector CT imaging of the abdomen and pelvis was performed using the standard protocol during bolus administration of intravenous contrast. Multiplanar reconstructed images including MIPs were obtained and reviewed to evaluate the vascular anatomy.  CONTRAST:  132mL OMNIPAQUE IOHEXOL 350 MG/ML SOLN  FINDINGS: ARTERIAL FINDINGS:  Aorta: Mild plaque in the visualized distal descending thoracic segment. There is a broad diverticulum involving the common origin of the celiac axis and SMA, measuring 17 mm diameter, resulting in a luminal diameter of 3.4 cm. The suprarenal segment of the aorta measures 2.4 cm diameter. There is fusiform ectasia of the infrarenal aorta measured up to 2.9 cm maximum transverse diameter. Minimal posterior intraluminal thrombus. No dissection or stenosis.  Celiac axis: Dilated common diverticulum with SMA, unremarkable distal branching.  Superior mesenteric: Dilated common diverticulum with celiac axis branches, with mild plaque just beyond the origin of the SMA without high-grade stenosis, classic distal branch anatomy.  Left renal:           Single, mild plaque, no stenosis.  Right renal: Duplicated, superior dominant, with mild partially calcified plaque extending from the ostium over a length of approximately 2 cm resulting in mild stenosis, patent distally. The inferior right renal artery is diminutive, supplying only a portion of the lower pole.  Inferior mesenteric: High-grade Short segment origin stenosis related to aortic wall plaque, patent distally.  Left iliac: Dilated common iliac artery measured up to a 2.1 cm diameter just proximal to its bifurcation. There is poor distal enhancement of the internal iliac artery from its origin, with wall  calcifications demonstrating a 40mm fusiform aneurysm of the internal iliac artery with possible occlusion. There is a persistent sciatic artery, an anatomic variant, which is dilated up to 19 mm diameter, nonenhanced, possibly thrombosed, incompletely visualized distally. There is eccentric calcified plaque in the proximal external iliac artery which is patent distally. Mild plaque in the common femoral artery.  Right iliac: Fusiform dilatation of the common iliac artery, measuring up to 13 mm diameter.  The internal iliac artery shows low enhancement proximally, possible distal thrombosis, with fusiform aneurysmal dilatation up to 16 mm, and a persistent atheromatous sciatic artery seen to the proximal thigh, measuring up to 18 mm diameter, incompletely visualized distally. External iliac artery normal in caliber. Common femoral artery patent.  Venous findings:      Venous phase imaging not obtained.  Review of the MIP images confirms the above findings.  Nonvascular findings: Visualized lung bases clear. Multiple low-attenuation liver lesions, some with peripheral contrast puddling consistent with benign hemangiomas, others measuring fluid attenuation, likely cysts. Unremarkable spleen, pancreas. Mild bilateral adrenal hyperplasia. Small bilateral renal cysts. No hydronephrosis. Stomach, small bowel, and colon are nondilated. Urinary bladder is thick walled, physiologically distended. Small amount of pelvic ascites. There is a right 5 cm iliopsoas intramuscular lipoma.  IMPRESSION: 1. 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 Recommend follow up by [place] in 5 years. This recommendation follows ACR consensus guidelines: White Paper of the ACR Incidental Findings Committee II on Vascular Findings. J Am Coll Radiol 2013; 57:322-025. 2. 21 mm left and 13 mm right fusiform common iliac artery aneurysms. 3. Bilateral internal iliac artery aneurysms and aneurysmal persistent sciatic arteries with poor  luminal opacification, possibly thrombosed. 4. Multiple hepatic cysts and hemangiomas.   Electronically Signed   By: Arne Cleveland M.D.   On: 08/21/2013 15:51    Microbiology: Recent Results (from the past 240 hour(s))  MRSA PCR SCREENING     Status: None   Collection Time    08/14/13  3:40 AM      Result Value Ref Range Status   MRSA by PCR NEGATIVE  NEGATIVE Final   Comment:            The GeneXpert MRSA Assay (FDA     approved for NASAL specimens     only), is one component of a     comprehensive MRSA colonization     surveillance program. It is not     intended to diagnose MRSA     infection nor to guide or     monitor treatment for     MRSA infections.     Labs: Basic Metabolic Panel:  Recent Labs Lab 08/17/13 0619 08/18/13 0655 08/19/13 1031 08/20/13 0520 08/21/13 0430  NA 133* 134* 137 137 136*  K 4.8 4.3 4.4 4.3 4.3  CL 101 101 104 105 103  CO2 20 19 21 21 22   GLUCOSE 88 91 91 91 92  BUN 24* 24* 19 20 21   CREATININE 1.24 1.25 1.02 1.21 1.18  CALCIUM 9.9 9.5 9.1 9.2 9.1   Liver Function Tests:  Recent Labs Lab 08/21/13 0430  ALBUMIN 3.0*   No results found for this basename: LIPASE, AMYLASE,  in the last 168 hours No results found for this basename: AMMONIA,  in the last 168 hours CBC:  Recent Labs Lab 08/17/13 0619 08/18/13 0655 08/20/13 0520 08/21/13 0430  WBC 6.6 7.1 7.3 7.0  HGB 9.3* 8.7* 7.7* 7.7*  HCT 27.8* 26.3* 23.3* 23.3*  MCV 82.5 83.0 84.4 84.7  PLT 239 227 190 208   Cardiac Enzymes: No results found for this basename: CKTOTAL, CKMB, CKMBINDEX, TROPONINI,  in the last 168 hours BNP: BNP (last 3 results)  Recent Labs  03/25/13 1240  PROBNP 14339.0*   CBG: No results found for this basename: GLUCAP,  in the last 168 hours  Signed:  [name], MD [facility] 08/22/2013, 2:07 PM

## 2013-08-22 NOTE — Progress Notes (Signed)
   Daily Progress Note  Assessment/Planning:  1.  Left great toe osteomyelitis  - Pt still needs to do a toe pressure on the left great toe: refused it yesterday. - I offered the patient a great toe amputation but he does not seem interested at this point - Pt is aware if the great toe amputation fails he will need a L BKA vs AKA  2. Bilateral persistent sciatic artery  - Left appears occluded - Right is proximally patent but occluded distally.   - I discussed possibly embolizing this R persistent sciatic artery but the patient again does not seem interested in proceeding.  3. Common trunk CA and SMA  - the orthogonal diameter of the common trunk <2 cm, not >3 cm - this should be observed - realistically, this pt could not survive a reimplantation   4.  CAD/Dilated CM  - Pt at high risk from a cardiac viewpoint from any procedure  5. Overall  Ok to d/c from vascular viewpoint if pt unwilling to proceed with great toe amputation  Subjective  - 4 Days Post-Op  No complaints  Objective Filed Vitals:   08/21/13 0517 08/21/13 1458 08/21/13 2048 08/22/13 0442  BP: 109/56 98/77 105/50 105/50  Pulse: 69 77 77 74  Temp: 98.8 F (37.1 C) 98.5 F (36.9 C) 101.1 F (38.4 C) 98.8 F (37.1 C)  TempSrc: Oral Oral Oral Oral  Resp: 18 16 18 18   Height:      Weight:      SpO2: 100% 100% 100% 100%    Intake/Output Summary (Last 24 hours) at 08/22/13 0727 Last data filed at 08/22/13 0137  Gross per 24 hour  Intake    120 ml  Output   1400 ml  Net  -1280 ml    PULM  CTAB CV  RRR GI  soft, NTND VASC  L great toe: most of black eschar has unroofed  Laboratory CBC    Component Value Date/Time   WBC 7.0 08/21/2013 0430   HGB 7.7* 08/21/2013 0430   HCT 23.3* 08/21/2013 0430   PLT 208 08/21/2013 0430    BMET    Component Value Date/Time   NA 136* 08/21/2013 0430   K 4.3 08/21/2013 0430   CL 103 08/21/2013 0430   CO2 22 08/21/2013 0430   GLUCOSE 92 08/21/2013 0430   BUN  21 08/21/2013 0430   CREATININE 1.18 08/21/2013 0430   CALCIUM 9.1 08/21/2013 0430   GFRNONAA 61* 08/21/2013 0430   GFRAA 71* 08/21/2013 0430    Adele Barthel, MD [facility] Office: [phone] Pager: [phone]  08/22/2013, 7:27 AM

## 2013-08-22 NOTE — Progress Notes (Signed)
Spoke with Pt. Regarding PICC insertion.  Explained risks and benefits.  Pt unsure he wants to proceed with PICC line.  States with talk with cousin this afternoon to decide.  Will follow up later.

## 2013-08-28 ENCOUNTER — Inpatient Hospital Stay (HOSPITAL_COMMUNITY): Admit: 2013-08-28 | Payer: Medicare Other

## 2013-08-29 ENCOUNTER — Encounter (HOSPITAL_COMMUNITY): Payer: Medicare Other

## 2014-01-20 ENCOUNTER — Emergency Department (HOSPITAL_COMMUNITY): Payer: Medicare Other

## 2014-01-20 ENCOUNTER — Encounter (HOSPITAL_COMMUNITY): Payer: Self-pay | Admitting: Emergency Medicine

## 2014-01-20 ENCOUNTER — Emergency Department (HOSPITAL_COMMUNITY)
Admission: EM | Admit: 2014-01-20 | Discharge: 2014-01-20 | Disposition: A | Discharge status: Home / Self Care | Payer: Medicare Other | Source: Home / Self Care | Attending: Emergency Medicine | Admitting: Emergency Medicine

## 2014-01-20 DIAGNOSIS — Z720 Tobacco use: Secondary | ICD-10-CM | POA: Insufficient documentation

## 2014-01-20 DIAGNOSIS — I10 Essential (primary) hypertension: Secondary | ICD-10-CM

## 2014-01-20 DIAGNOSIS — Z7982 Long term (current) use of aspirin: Secondary | ICD-10-CM | POA: Insufficient documentation

## 2014-01-20 DIAGNOSIS — I509 Heart failure, unspecified: Secondary | ICD-10-CM | POA: Insufficient documentation

## 2014-01-20 DIAGNOSIS — Z792 Long term (current) use of antibiotics: Secondary | ICD-10-CM | POA: Insufficient documentation

## 2014-01-20 DIAGNOSIS — I96 Gangrene, not elsewhere classified: Secondary | ICD-10-CM

## 2014-01-20 DIAGNOSIS — M869 Osteomyelitis, unspecified: Secondary | ICD-10-CM | POA: Diagnosis not present

## 2014-01-20 DIAGNOSIS — I739 Peripheral vascular disease, unspecified: Secondary | ICD-10-CM

## 2014-01-20 DIAGNOSIS — Z79899 Other long term (current) drug therapy: Secondary | ICD-10-CM

## 2014-01-20 LAB — CBC WITH DIFFERENTIAL/PLATELET
BASOS PCT: 0 % (ref 0–1)
Basophils Absolute: 0 10*3/uL (ref 0.0–0.1)
Eosinophils Absolute: 0.4 10*3/uL (ref 0.0–0.7)
Eosinophils Relative: 3 % (ref 0–5)
HCT: 30.2 % — ABNORMAL LOW (ref 39.0–52.0)
Hemoglobin: 9.8 g/dL — ABNORMAL LOW (ref 13.0–17.0)
LYMPHS ABS: 1.9 10*3/uL (ref 0.7–4.0)
Lymphocytes Relative: 15 % (ref 12–46)
MCH: 27.2 pg (ref 26.0–34.0)
MCHC: 32.5 g/dL (ref 30.0–36.0)
MCV: 83.9 fL (ref 78.0–100.0)
Monocytes Absolute: 1 10*3/uL (ref 0.1–1.0)
Monocytes Relative: 8 % (ref 3–12)
NEUTROS PCT: 74 % (ref 43–77)
Neutro Abs: 9.3 10*3/uL — ABNORMAL HIGH (ref 1.7–7.7)
PLATELETS: 419 10*3/uL — AB (ref 150–400)
RBC: 3.6 MIL/uL — AB (ref 4.22–5.81)
RDW: 13.9 % (ref 11.5–15.5)
WBC: 12.7 10*3/uL — ABNORMAL HIGH (ref 4.0–10.5)

## 2014-01-20 LAB — COMPREHENSIVE METABOLIC PANEL
ALBUMIN: 3 g/dL — AB (ref 3.5–5.2)
ALK PHOS: 97 U/L (ref 39–117)
ALT: 24 U/L (ref 0–53)
AST: 21 U/L (ref 0–37)
Anion gap: 13 (ref 5–15)
BUN: 19 mg/dL (ref 6–23)
CO2: 24 mEq/L (ref 19–32)
Calcium: 9.4 mg/dL (ref 8.4–10.5)
Chloride: 100 mEq/L (ref 96–112)
Creatinine, Ser: 0.96 mg/dL (ref 0.50–1.35)
GFR calc Af Amer: >90 mL/min (ref >90)
GFR calc non Af Amer: 83 mL/min — ABNORMAL LOW (ref >90)
Glucose, Bld: 116 mg/dL — ABNORMAL HIGH (ref 70–99)
POTASSIUM: 3.9 meq/L (ref 3.7–5.3)
SODIUM: 137 meq/L (ref 137–147)
TOTAL PROTEIN: 8.4 g/dL — AB (ref 6.0–8.3)
Total Bilirubin: 0.2 mg/dL — ABNORMAL LOW (ref 0.3–1.2)

## 2014-01-20 LAB — LACTIC ACID, PLASMA: Lactic Acid, Venous: 0.7 mmol/L (ref 0.5–2.2)

## 2014-01-20 MED ORDER — LEVOFLOXACIN 500 MG PO TABS: 500.0000 mg | ORAL_TABLET | Freq: Every day | ORAL | Status: DC

## 2014-01-20 NOTE — Discharge Instructions (Signed)
Your toe is black because of poor circulation to your foot, and infection. The only treatment would involve amputation of dead tissue of your toe, foot, and possibly lower leg.  If you change your mind and decide that your want to procede with treatment, you may return here at any time. You may call the surgeon that evaluated your foot in May (Dr. Adele Barthel) for another opinion. Office number [phone].  Gangrene Gangrene is the decay or death of an organ or tissue caused by loss of blood supply. It may be caused by infectious or inflammatory processes. Or it may be caused by degenerative changes in long-standing diseases such as diabetes. It may also occur following injuries or surgical procedures. TYPES OF GANGRENE There are three major types of gangrene: dry, moist, and gaseous (a type of moist gangrene).  Dry gangrene is a condition that results when one or more arteries become blocked. Arteries are muscular vessels which carry blood away from the heart and around the body. In this type of gangrene, the tissue slowly dies, due to loss of blood supply. But it does not become infected. The affected area becomes cold and black. It begins to dry out and wither. Eventually it drops off over a period of weeks or months. Dry gangrene is most common in people with advanced blockages of the arteries (arteriosclerosis or hardening of the arteries) resulting from diabetes.  Moist gangrene occurs in the toes, feet, or legs after a crushing injury. Or it can be a result of some other problem that causes blood flow in an area to suddenly stop. When blood flow stops, bacteria begin to invade the muscle and grow. It multiplies quickly, without the body's immune system stopping it.  Gaseous gangrene is also called muscle death (myonecrosis). It is a type of moist gangrene commonly caused by a bacterial infection. This is usually caused by a bacteria which can live in an area of little or no oxygen. Once present  in tissue, these bacteria produce gases and poisonous toxins as they grow. These bacteria are normally found in the gut, respiratory, and male urinary tract. They often infect thigh amputation wounds commonly in people who have lost control of their bowel functions (incontinence). Gangrene, incontinence, and weakness often are found in patients with diabetes. It is in the amputation stump of diabetic patients that gaseous gangrene often happens. The most common bacteria to cause gaseous gangrene is clostridia. Other bacteria which cause moist gangrene include streptococcus and staphylococcus. A serious, but rare form of infection with group A streptococci can slow blood flow. If untreated, it can progress to gangrene. This is more commonly called necrotizing fasciitis. This is an infection of the skin and tissues directly beneath the skin. This is sometimes called the "flesh-eating bacteria". It is called this because it can rapidly kill large areas of flesh in the body. It requires immediate surgical treatment or it will result in death. CAUSES   Some illnesses can cause gangrene because the vessels carrying the blood are already diseased. Examples are:  Long-standing illnesses, such as diabetes mellitus or arteriosclerosis.  Other diseases affecting the blood vessels, such as Buerger disease or Raynaud disease. Other causes of gangrene include:   Open bone breaks.  Burns.  Injections given under the skin or in a muscle.  Gangrene may occur following surgery, particularly in individuals with diabetes or other long-term (chronic) disease.  Gas gangrene can be also a complication of dry gangrene. Or it can happen suddenly  along with an underlying cancer. Moist gangrene and gas gangrene are different. Gas gangrene usually involves only the muscle. It does not involve the skin as much. In moist or gas gangrene, there is a feeling of heaviness in the affected area that is followed by severe pain.  The pain is caused by swelling from fluid or gas accumulation in the tissues. This pain is the worst, on average, between 1 and 4 days following the injury. It can range from several hours to several weeks. The swollen skin may at first be blistered, red, and warm to the touch. Then it changes to a bronze, brown, or black color. In the majority of cases, the affected and surrounding tissues may give off crackling sounds (crepitus). This is a result of gas bubbles accumulating under the skin. The gas may be felt beneath the skin. In wet gangrene, the pus is foul-smelling. In gas gangrene, there is no true pus, just an almost "sweet" smelling watery discharge. If the bacterial toxins spread to the bloodstream, the following may happen:  A higher than normal temperature.  Fast heart rate and breathing.  Changed mental state.  Loss of appetite.  Diarrhea.  Vomiting.  Shock. SYMPTOMS  Areas of either dry or moist gangrene are first noticed as a red line on the skin that marks the border of the affected tissues.  As tissues begin to die, dry gangrene may cause pain in the early stages. Or it may go unnoticed.  This happens especially in the elderly or in individuals with decreased ability to feel pain.  At first, the area becomes cold, numb, and pale. Later its color changes to brown, then black. This dead tissue will gradually separate from the healthy tissue and fall off. DIAGNOSIS  Diagnosis of gangrene is based on patient history, physical examination, and results of blood and other lab tests.  A caregiver will look for a history of:  Recent damage caused by an accident (trauma).  Surgery.  Cancer.  Chronic disease.  Blood tests can be used to find out if infection is present and find how much it has spread.  Drainage from a wound, or a tissue sample obtained through surgical exploration, may be cultured to:  Identify the bacteria causing the infection.  Help in figuring out  which antibiotic will be most effective.  Gas accumulation and muscle death (myonecrosis) may not be visible. So X-ray studies and more sophisticated imaging techniques may be helpful in making a diagnosis. They include:  Computed tomography (CT) scans.  Magnetic resonance imaging (MRI).  These techniques are not sufficient alone to provide an accurate diagnosis of gangrene, though.  Precise diagnosis of gas gangrene often requires surgical exploration of the wound.  During such a procedure, the exposed muscle may appear pale, beefy-red. In the most advanced stages, it may be black. If infected, the muscle will fail to contract with stimulation, and the cut surface will not bleed. TREATMENT  Gaseous gangrene is a medical emergency. It requires immediate surgery and antibiotics. If the infection spreads to the bloodstream and infects vital organs, it can rapidly result in death.   Areas of dry gangrene that remain free from infection (aseptic) in the arms or hands and legs or feet (extremities) are often left to wither and fall off. Treatments applied to the wound on the surface are generally not effective without enough blood supply to support wound healing.  Evaluation by a vascular surgeon, along with X-rays to determine blood supply and circulation  to the affected area, can help figure out if surgery would be helpful.  Once the cause of infection is found, moist gangrene requires immediate intravenous, intramuscular, or topical broad-spectrum antibiotic therapy. Also, the infected tissue must be removed surgically. Amputation of the affected limb may be needed. Pain medicines (analgesics) are prescribed for pain. Fluids injected into the veins (intravenous fluids) and, occasionally, blood transfusions are used to treat shock and replenish red blood cells and electrolytes. Plenty of water and healthy foods are needed for wound healing.  Some cases of gangrene are treated by giving oxygen  under pressure greater than that of the atmosphere (hyperbaric) to the patient. This happens in a specially designed chamber. The idea behind using hyperbaric oxygen is that more oxygen will become dissolved in the patient's bloodstream. So more oxygen will be delivered to the gangrenous areas. By providing the right amount of oxygenation, the body's ability to fight off the bacterial infection is believed to be improved. There is a direct harmful effect on the bacteria that thrive in an oxygen-free environment. Some studies have shown that the use of hyperbaric oxygen relieves pain, reduces the number of amputations required, and reduces the extent of surgical debridement (removal of dead or damaged tissue).  The emotional needs of the patient must also be met. The individual with gangrene should be offered moral support, along with an opportunity to share questions and concerns. In addition, particularly in cases where amputation was required; physical, vocational, and rehabilitation therapy will also be required. Except in cases where the infection has spread to the bloodstream, the result of treated gangrene is usually favorable.  PREVENTION  Patients with diabetes or severe arteriosclerosis should take special care of their hands and feet. This is due to the risk of infection associated with even minor injuries. Education about good foot care is important. Poor blood flow as a result diseased blood vessels will lessen the body's defenses against invading germs. Your caregiver will take steps to improve circulation whenever possible. All injuries to skin or tissue should be cared for immediately. Dying or infected skin should be removed immediately. This will prevent the spread of bacteria. Wounds into the abdomen should be surgically treated and damage repaired early. This should be followed up with antibiotic treatment. Patients undergoing non-emergency (elective) intestinal surgery may receive preventive  antibiotic therapy. Use of antibiotics prior to and directly following surgery has been shown to significantly reduce the rate of infection.  SEEK IMMEDIATE MEDICAL CARE IF:  You show signs of gaseous gangrene. MAKE SURE YOU:   Understand these instructions.  Will watch your condition.  Will get help right away if you are not doing well or get worse. Document Released: 01/20/2004 Document Revised: 09/19/2011 Document Reviewed: 05/14/2013 [facility] Patient Information 2015 [place], [place]. This information is not intended to replace advice given to you by your health care provider. Make sure you discuss any questions you have with your health care provider.  Peripheral Vascular Disease Peripheral Vascular Disease (PVD), also called Peripheral Arterial Disease (PAD), is a circulation problem caused by cholesterol (atherosclerotic plaque) deposits in the arteries. PVD commonly occurs in the lower extremities (legs) but it can occur in other areas of the body, such as your arms. The cholesterol buildup in the arteries reduces blood flow which can cause pain and other serious problems. The presence of PVD can place a person at risk for Coronary Artery Disease (CAD).  CAUSES  Causes of PVD can be many. It is usually associated  with more than one risk factor such as:   High Cholesterol.  Smoking.  Diabetes.  Lack of exercise or inactivity.  High blood pressure (hypertension).  Obesity.  Family history. SYMPTOMS   When the lower extremities are affected, patients with PVD may experience:  Leg pain with exertion or physical activity. This is called INTERMITTENT CLAUDICATION. This may present as cramping or numbness with physical activity. The location of the pain is associated with the level of blockage. For example, blockage at the abdominal level (distal abdominal aorta) may result in buttock or hip pain. Lower leg arterial blockage may result in calf pain.  As PVD becomes more severe,  pain can develop with less physical activity.  In people with severe PVD, leg pain may occur at rest.  Other PVD signs and symptoms:  Leg numbness or weakness.  Coldness in the affected leg or foot, especially when compared to the other leg.  A change in leg color.  Patients with significant PVD are more prone to ulcers or sores on toes, feet or legs. These may take longer to heal or may reoccur. The ulcers or sores can become infected.  If signs and symptoms of PVD are ignored, gangrene may occur. This can result in the loss of toes or loss of an entire limb.  Not all leg pain is related to PVD. Other medical conditions can cause leg pain such as:  Blood clots (embolism) or Deep Vein Thrombosis.  Inflammation of the blood vessels (vasculitis).  Spinal stenosis. DIAGNOSIS  Diagnosis of PVD can involve several different types of tests. These can include:  Pulse Volume Recording Method (PVR). This test is simple, painless and does not involve the use of X-rays. PVR involves measuring and comparing the blood pressure in the arms and legs. An ABI (Ankle-Brachial Index) is calculated. The normal ratio of blood pressures is 1. As this number becomes smaller, it indicates more severe disease.  < 0.95 - indicates significant narrowing in one or more leg vessels.  <0.8 - there will usually be pain in the foot, leg or buttock with exercise.  <0.4 - will usually have pain in the legs at rest.  <0.25 - usually indicates limb threatening PVD.  Doppler detection of pulses in the legs. This test is painless and checks to see if you have a pulses in your legs/feet.  A dye or contrast material (a substance that highlights the blood vessels so they show up on x-ray) may be given to help your caregiver better see the arteries for the following tests. The dye is eliminated from your body by the kidney's. Your caregiver may order blood work to check your kidney function and other laboratory values  before the following tests are performed:  Magnetic Resonance Angiography (MRA). An MRA is a picture study of the blood vessels and arteries. The MRA machine uses a large magnet to produce images of the blood vessels.  Computed Tomography Angiography (CTA). A CTA is a specialized x-ray that looks at how the blood flows in your blood vessels. An IV may be inserted into your arm so contrast dye can be injected.  Angiogram. Is a procedure that uses x-rays to look at your blood vessels. This procedure is minimally invasive, meaning a small incision (cut) is made in your groin. A small tube (catheter) is then inserted into the artery of your groin. The catheter is guided to the blood vessel or artery your caregiver wants to examine. Contrast dye is injected into the  catheter. X-rays are then taken of the blood vessel or artery. After the images are obtained, the catheter is taken out. TREATMENT  Treatment of PVD involves many interventions which may include:  Lifestyle changes:  Quitting smoking.  Exercise.  Following a low fat, low cholesterol diet.  Control of diabetes.  Foot care is very important to the PVD patient. Good foot care can help prevent infection.  Medication:  Cholesterol-lowering medicine.  Blood pressure medicine.  Anti-platelet drugs.  Certain medicines may reduce symptoms of Intermittent Claudication.  Interventional/Surgical options:  Angioplasty. An Angioplasty is a procedure that inflates a balloon in the blocked artery. This opens the blocked artery to improve blood flow.  Stent Implant. A wire mesh tube (stent) is placed in the artery. The stent expands and stays in place, allowing the artery to remain open.  Peripheral Bypass Surgery. This is a surgical procedure that reroutes the blood around a blocked artery to help improve blood flow. This type of procedure may be performed if Angioplasty or stent implants are not an option. SEEK IMMEDIATE MEDICAL CARE  IF:   You develop pain or numbness in your arms or legs.  Your arm or leg turns cold, becomes blue in color.  You develop redness, warmth, swelling and pain in your arms or legs. MAKE SURE YOU:   Understand these instructions.  Will watch your condition.  Will get help right away if you are not doing well or get worse. Document Released: 04/27/2004 Document Revised: 06/12/2011 Document Reviewed: 03/24/2008 [facility] Patient Information 2015 [place], [place]. This information is not intended to replace advice given to you by your health care provider. Make sure you discuss any questions you have with your health care provider.

## 2014-01-20 NOTE — ED Notes (Signed)
Skin just before great toe is ulcerated with green discharge and foul odor. Pt in nad.

## 2014-01-20 NOTE — ED Provider Notes (Signed)
CSN: [id number]     Arrival date & time 01/20/14  1430 History  This chart was scribed for Rodney Furry, MD by [name], ED Scribe. This patient was seen in room APA19/APA19 and the patient's care was started 4:57 PM.    Chief Complaint  Patient presents with  . Foot Pain      The history is provided by the patient (and Caregiver ). No language interpreter was used.   HPI Comments:  Rodney [name] is a 68 y.o. 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 Caregiver believes wound started when the nail to his great toe was removed.  He was evaluated by a home health nurse today and was advised to come to the ED to be evaluated. Pt was seen for the same in May 2015 and was advised to have his left great toe amputated, pt refused at that time.  He denies fever and chills. No alleviating factors noted.  Pt has been in a rest home in [place] since September 2015.      Past Medical History  Diagnosis Date  . HTN (hypertension)   . Smoking hx 03/25/2013  . Alcohol abuse 03/25/2013  . CHF (congestive heart failure) 03/25/2013   Past Surgical History  Procedure Laterality Date  . Above knee leg amputation Right   . Amputation Right 03/26/2013    Procedure: AMPUTATION ABOVE KNEE;  Surgeon: Rosetta Posner, MD;  Location: [facility] OR;  Service: Vascular;  Laterality: Right;   Family History  Problem Relation Age of Onset  . Heart disease      No family history   History  Substance Use Topics  . Smoking status: Current Some Day Smoker    Types: Cigarettes  . Smokeless tobacco: Not on file  . Alcohol Use: No     Comment: everyday    Review of Systems  Constitutional: Negative for fever and chills.  Skin: Positive for wound.  All other systems reviewed and are negative.     Allergies  Review of patient's allergies indicates no known allergies.  Home  Medications   Prior to Admission medications   Medication Sig Start Date End Date Taking? Authorizing Provider  acetaminophen (TYLENOL) 325 MG tablet Take 2 tablets (650 mg total) by mouth every 6 (six) hours as needed for mild pain. 08/22/13  Yes [name], MD  aspirin EC 81 MG tablet Take 81 mg by mouth daily.   Yes Historical Provider, MD  atorvastatin (LIPITOR) 40 MG tablet Take 40 mg by mouth every evening. 04/07/13  Yes [name], MD  carvedilol (COREG) 6.25 MG tablet Take 1 tablet (6.25 mg total) by mouth 2 (two) times daily with a meal. 04/07/13  Yes [name], MD  escitalopram (LEXAPRO) 5 MG tablet Take 5 mg by mouth daily. 07/31/13  Yes Historical Provider, MD  furosemide (LASIX) 40 MG tablet Take 40 mg by mouth daily as needed for fluid. 04/07/13  Yes [name], MD  mirtazapine (REMERON) 15 MG tablet Take 7.5 mg by mouth at bedtime.   Yes Historical Provider, MD  thiamine (VITAMIN B-1) 100 MG tablet Take 200 mg by mouth daily.   Yes Historical Provider, MD  vitamin C (ASCORBIC ACID) 500 MG tablet Take 500 mg by mouth daily.   Yes Historical Provider, MD  zinc sulfate 220 MG capsule  Take 220 mg by mouth daily.   Yes Historical Provider, MD  levofloxacin (LEVAQUIN) 500 MG tablet Take 1 tablet (500 mg total) by mouth daily. 01/20/14   Rodney Furry, MD   BP 112/56  Pulse 87  Temp(Src) 98.5 F (36.9 C) (Oral)  Resp 18  Wt 105 lb 5 oz (47.769 kg)  SpO2 100% Physical Exam  Nursing note and vitals reviewed. Constitutional: He is oriented to person, place, and time. No distress.  Thin, emaciated   HENT:  Head: Normocephalic.  Eyes: Conjunctivae are normal. Pupils are equal, round, and reactive to light. No scleral icterus.  Neck: Normal range of motion. Neck supple. No thyromegaly present.  Cardiovascular: Normal rate and regular rhythm.  Exam reveals no gallop and no friction rub.   No murmur heard. Pulmonary/Chest: Effort normal and breath sounds normal. No  respiratory distress. He has no wheezes. He has no rales.  Abdominal: Soft. Bowel sounds are normal. He exhibits no distension. There is no tenderness. There is no rebound.  Musculoskeletal: Normal range of motion.  Right AKA  LLE pulseless below groin  Thin shiny skin from knee distally Erythema to entire forefoot Necrosis to left great toe  Neurological: He is alert and oriented to person, place, and time.  Skin: Skin is warm and dry. No rash noted. There is erythema.  Psychiatric: He has a normal mood and affect. His behavior is normal.    ED Course  Procedures   DIAGNOSTIC STUDIES:  Oxygen Saturation is 100% on RA, normal by my interpretation.    COORDINATION OF CARE:  5:04 PM Discussed treatment plan with pt at bedside and pt agreed to plan.  Labs Review Labs Reviewed  CBC WITH DIFFERENTIAL - Abnormal; Notable for the following:    WBC 12.7 (*)    RBC 3.60 (*)    Hemoglobin 9.8 (*)    HCT 30.2 (*)    Platelets 419 (*)    Neutro Abs 9.3 (*)    All other components within normal limits  COMPREHENSIVE METABOLIC PANEL - Abnormal; Notable for the following:    Glucose, Bld 116 (*)    Total Protein 8.4 (*)    Albumin 3.0 (*)    Total Bilirubin 0.2 (*)    GFR calc non Af Amer 83 (*)    All other components within normal limits  CULTURE, BLOOD (ROUTINE X 2)  CULTURE, BLOOD (ROUTINE X 2)  LACTIC ACID, PLASMA    Imaging Review No results found.   EKG Interpretation None        X-ray shows nonvisualization of the distal most aspect of the distal phalanx of the left great toe consistent with avascular necrosis/osteomyelitis.  MDM   Final diagnoses:  Toe gangrene  Peripheral vascular disease   Multiple frank and rather lengthy discussions with the patient. He does understand that he is extremely poor circulation to the leg. This was discussed with him with Dr. [name] in his hospitalization in May. He continues to be adamant that he'll not undergo any kind of  amputation or surgical treatment. When asked if he would still feel this way if faced with loss of his life he stated emphatically "yes".  Be discharged. He'll be placed on Levaquin. He was given the vascular surgeon's office number if he chooses to call for appointment. Told him that he may return here at any time if he changes his mind regarding treatment.   I personally performed the services described in this documentation, which was scribed  in my presence. The recorded information has been reviewed and is accurate.      Rodney Furry, MD 01/20/14 [phone]

## 2014-01-20 NOTE — ED Notes (Addendum)
Pt here from [place], sent for eval of lt foot infection ?unclear about onset.  Lt great toe is necrotic,

## 2014-01-20 NOTE — ED Notes (Signed)
edp in with pt 

## 2014-01-22 ENCOUNTER — Inpatient Hospital Stay (HOSPITAL_COMMUNITY)
Admission: EM | Admit: 2014-01-22 | Discharge: 2014-01-27 | DRG: 540 | Disposition: A | Discharge status: Home / Self Care | Payer: Medicare Other | Attending: Internal Medicine | Admitting: Internal Medicine

## 2014-01-22 ENCOUNTER — Encounter (HOSPITAL_COMMUNITY): Payer: Self-pay | Admitting: Emergency Medicine

## 2014-01-22 DIAGNOSIS — I5021 Acute systolic (congestive) heart failure: Secondary | ICD-10-CM

## 2014-01-22 DIAGNOSIS — F1010 Alcohol abuse, uncomplicated: Secondary | ICD-10-CM

## 2014-01-22 DIAGNOSIS — Z9119 Patient's noncompliance with other medical treatment and regimen: Secondary | ICD-10-CM | POA: Diagnosis present

## 2014-01-22 DIAGNOSIS — Z89611 Acquired absence of right leg above knee: Secondary | ICD-10-CM | POA: Diagnosis not present

## 2014-01-22 DIAGNOSIS — D649 Anemia, unspecified: Secondary | ICD-10-CM | POA: Diagnosis present

## 2014-01-22 DIAGNOSIS — F17210 Nicotine dependence, cigarettes, uncomplicated: Secondary | ICD-10-CM | POA: Diagnosis present

## 2014-01-22 DIAGNOSIS — I96 Gangrene, not elsewhere classified: Secondary | ICD-10-CM | POA: Diagnosis present

## 2014-01-22 DIAGNOSIS — Z7982 Long term (current) use of aspirin: Secondary | ICD-10-CM

## 2014-01-22 DIAGNOSIS — I351 Nonrheumatic aortic (valve) insufficiency: Secondary | ICD-10-CM | POA: Diagnosis present

## 2014-01-22 DIAGNOSIS — I70262 Atherosclerosis of native arteries of extremities with gangrene, left leg: Secondary | ICD-10-CM | POA: Diagnosis not present

## 2014-01-22 DIAGNOSIS — I2510 Atherosclerotic heart disease of native coronary artery without angina pectoris: Secondary | ICD-10-CM | POA: Diagnosis present

## 2014-01-22 DIAGNOSIS — I255 Ischemic cardiomyopathy: Secondary | ICD-10-CM | POA: Diagnosis present

## 2014-01-22 DIAGNOSIS — I5022 Chronic systolic (congestive) heart failure: Secondary | ICD-10-CM | POA: Diagnosis present

## 2014-01-22 DIAGNOSIS — Z79899 Other long term (current) drug therapy: Secondary | ICD-10-CM

## 2014-01-22 DIAGNOSIS — L03119 Cellulitis of unspecified part of limb: Secondary | ICD-10-CM | POA: Diagnosis present

## 2014-01-22 DIAGNOSIS — E785 Hyperlipidemia, unspecified: Secondary | ICD-10-CM | POA: Diagnosis present

## 2014-01-22 DIAGNOSIS — M869 Osteomyelitis, unspecified: Secondary | ICD-10-CM | POA: Diagnosis present

## 2014-01-22 DIAGNOSIS — Z91199 Patient's noncompliance with other medical treatment and regimen due to unspecified reason: Secondary | ICD-10-CM

## 2014-01-22 DIAGNOSIS — I10 Essential (primary) hypertension: Secondary | ICD-10-CM | POA: Diagnosis present

## 2014-01-22 DIAGNOSIS — I739 Peripheral vascular disease, unspecified: Secondary | ICD-10-CM | POA: Diagnosis present

## 2014-01-22 DIAGNOSIS — L03116 Cellulitis of left lower limb: Secondary | ICD-10-CM | POA: Diagnosis present

## 2014-01-22 DIAGNOSIS — J449 Chronic obstructive pulmonary disease, unspecified: Secondary | ICD-10-CM

## 2014-01-22 HISTORY — DX: Osteomyelitis, unspecified: M86.9

## 2014-01-22 HISTORY — DX: Cellulitis of unspecified part of limb: L03.119

## 2014-01-22 LAB — BASIC METABOLIC PANEL
Anion gap: 12 (ref 5–15)
BUN: 17 mg/dL (ref 6–23)
CALCIUM: 9.5 mg/dL (ref 8.4–10.5)
CHLORIDE: 101 meq/L (ref 96–112)
CO2: 24 meq/L (ref 19–32)
Creatinine, Ser: 0.93 mg/dL (ref 0.50–1.35)
GFR calc Af Amer: >90 mL/min (ref >90)
GFR calc non Af Amer: 84 mL/min — ABNORMAL LOW (ref >90)
GLUCOSE: 106 mg/dL — AB (ref 70–99)
Potassium: 4.3 mEq/L (ref 3.7–5.3)
SODIUM: 137 meq/L (ref 137–147)

## 2014-01-22 LAB — CBC WITH DIFFERENTIAL/PLATELET
Basophils Absolute: 0 10*3/uL (ref 0.0–0.1)
Basophils Relative: 0 % (ref 0–1)
EOS PCT: 4 % (ref 0–5)
Eosinophils Absolute: 0.5 10*3/uL (ref 0.0–0.7)
HEMATOCRIT: 31.9 % — AB (ref 39.0–52.0)
Hemoglobin: 10.2 g/dL — ABNORMAL LOW (ref 13.0–17.0)
LYMPHS PCT: 17 % (ref 12–46)
Lymphs Abs: 2 10*3/uL (ref 0.7–4.0)
MCH: 26.8 pg (ref 26.0–34.0)
MCHC: 32 g/dL (ref 30.0–36.0)
MCV: 83.9 fL (ref 78.0–100.0)
MONO ABS: 0.9 10*3/uL (ref 0.1–1.0)
Monocytes Relative: 8 % (ref 3–12)
Neutro Abs: 8.6 10*3/uL — ABNORMAL HIGH (ref 1.7–7.7)
Neutrophils Relative %: 71 % (ref 43–77)
Platelets: 460 10*3/uL — ABNORMAL HIGH (ref 150–400)
RBC: 3.8 MIL/uL — ABNORMAL LOW (ref 4.22–5.81)
RDW: 14 % (ref 11.5–15.5)
WBC: 12.1 10*3/uL — AB (ref 4.0–10.5)

## 2014-01-22 MED ORDER — ACETAMINOPHEN 325 MG PO TABS
650.0000 mg | ORAL_TABLET | Freq: Four times a day (QID) | ORAL | Status: DC | PRN
  Administered 2014-01-24: 650 mg via ORAL
  Filled 2014-01-22 (×2): qty 2

## 2014-01-22 MED ORDER — HYDROMORPHONE HCL 1 MG/ML IJ SOLN: 0.5000 mg | INTRAMUSCULAR | Status: AC | PRN

## 2014-01-22 MED ORDER — ONDANSETRON HCL 4 MG/2ML IJ SOLN: 4.0000 mg | Freq: Four times a day (QID) | INTRAMUSCULAR | Status: DC | PRN

## 2014-01-22 MED ORDER — VITAMIN B-1 100 MG PO TABS
200.0000 mg | ORAL_TABLET | Freq: Every day | ORAL | Status: DC
  Administered 2014-01-22 – 2014-01-27 (×6): 200 mg via ORAL
  Filled 2014-01-22 (×6): qty 2

## 2014-01-22 MED ORDER — HEPARIN SODIUM (PORCINE) 5000 UNIT/ML IJ SOLN
5000.0000 [IU] | Freq: Three times a day (TID) | INTRAMUSCULAR | Status: DC
  Administered 2014-01-22 – 2014-01-27 (×14): 5000 [IU] via SUBCUTANEOUS
  Filled 2014-01-22 (×18): qty 1

## 2014-01-22 MED ORDER — ONDANSETRON HCL 4 MG PO TABS: 4.0000 mg | ORAL_TABLET | Freq: Four times a day (QID) | ORAL | Status: DC | PRN

## 2014-01-22 MED ORDER — ATORVASTATIN CALCIUM 40 MG PO TABS
40.0000 mg | ORAL_TABLET | Freq: Every evening | ORAL | Status: DC
  Administered 2014-01-22 – 2014-01-26 (×5): 40 mg via ORAL
  Filled 2014-01-22 (×7): qty 1

## 2014-01-22 MED ORDER — ASPIRIN EC 81 MG PO TBEC
81.0000 mg | DELAYED_RELEASE_TABLET | Freq: Every day | ORAL | Status: DC
  Administered 2014-01-22 – 2014-01-27 (×6): 81 mg via ORAL
  Filled 2014-01-22 (×6): qty 1

## 2014-01-22 MED ORDER — ZINC SULFATE 220 (50 ZN) MG PO CAPS
220.0000 mg | ORAL_CAPSULE | Freq: Every day | ORAL | Status: DC
  Administered 2014-01-22 – 2014-01-27 (×6): 220 mg via ORAL
  Filled 2014-01-22 (×7): qty 1

## 2014-01-22 MED ORDER — VITAMIN C 500 MG PO TABS
500.0000 mg | ORAL_TABLET | Freq: Every day | ORAL | Status: DC
  Administered 2014-01-22 – 2014-01-27 (×6): 500 mg via ORAL
  Filled 2014-01-22 (×7): qty 1

## 2014-01-22 MED ORDER — SODIUM CHLORIDE 0.9 % IV SOLN
INTRAVENOUS | Status: AC
  Administered 2014-01-22: 19:00:00 via INTRAVENOUS

## 2014-01-22 MED ORDER — CARVEDILOL 3.125 MG PO TABS
6.2500 mg | ORAL_TABLET | Freq: Two times a day (BID) | ORAL | Status: DC
  Administered 2014-01-22 – 2014-01-27 (×10): 6.25 mg via ORAL
  Filled 2014-01-22 (×14): qty 2

## 2014-01-22 MED ORDER — PIPERACILLIN-TAZOBACTAM 3.375 G IVPB
3.3750 g | Freq: Three times a day (TID) | INTRAVENOUS | Status: DC
  Administered 2014-01-22 – 2014-01-25 (×9): 3.375 g via INTRAVENOUS
  Filled 2014-01-22 (×18): qty 50

## 2014-01-22 MED ORDER — VANCOMYCIN HCL IN DEXTROSE 1-5 GM/200ML-% IV SOLN
1000.0000 mg | Freq: Once | INTRAVENOUS | Status: AC
  Administered 2014-01-22: 1000 mg via INTRAVENOUS
  Filled 2014-01-22: qty 200

## 2014-01-22 MED ORDER — CITALOPRAM HYDROBROMIDE 20 MG PO TABS
20.0000 mg | ORAL_TABLET | Freq: Every day | ORAL | Status: DC
  Administered 2014-01-22 – 2014-01-27 (×6): 20 mg via ORAL
  Filled 2014-01-22 (×7): qty 1

## 2014-01-22 MED ORDER — VANCOMYCIN HCL 500 MG IV SOLR
500.0000 mg | Freq: Two times a day (BID) | INTRAVENOUS | Status: DC
  Administered 2014-01-23 – 2014-01-27 (×9): 500 mg via INTRAVENOUS
  Filled 2014-01-22 (×17): qty 500

## 2014-01-22 MED ORDER — MIRTAZAPINE 15 MG PO TABS
7.5000 mg | ORAL_TABLET | Freq: Every day | ORAL | Status: DC
  Administered 2014-01-22 – 2014-01-26 (×5): 7.5 mg via ORAL
  Filled 2014-01-22 (×9): qty 0.5

## 2014-01-22 MED ORDER — ONDANSETRON HCL 4 MG/2ML IJ SOLN: 4.0000 mg | Freq: Three times a day (TID) | INTRAMUSCULAR | Status: AC | PRN

## 2014-01-22 NOTE — ED Notes (Addendum)
Seen here 2 days ago and diagnosed with gangrene to left foot.  Told he needed an amputation and refused treatment.   Was told to return if he changed his mind.  Is taking po levaquin.   Is back today for treatment.

## 2014-01-22 NOTE — ED Notes (Signed)
Pt presents with necrotic big toe on left foot, with surrounding area affected. Pt states he has been using his antibiotic Levaquin cream, last used last night. Denies all pain. States he was told by ED physician two days ago that he needed "the toe" amputated but denied at that time, now states he wants it amputated.

## 2014-01-22 NOTE — ED Provider Notes (Signed)
CSN: [id number]     Arrival date & time 01/22/14  1234 History  This chart was scribed for Carmin Muskrat, MD by [facility], ED Scribe. The patient was seen in APA01/APA01 and the patient's care was started at 1:48PM.    Chief Complaint  Patient presents with  . Foot Pain   The history is provided by the patient. No language interpreter was used.   HPI Comments: Amber Guthridge is a 68 y.o. male who presents to the Emergency Department complaining of left foot pain specifcally on left greater toe. Pt came in two days ago for the same complaint and notes it has worsened since then. He denies fever, vomiting, chills, and diarrhea.  Past Medical History  Diagnosis Date  . HTN (hypertension)   . Smoking hx 03/25/2013  . Alcohol abuse 03/25/2013  . CHF (congestive heart failure) 03/25/2013   Past Surgical History  Procedure Laterality Date  . Above knee leg amputation Right   . Amputation Right 03/26/2013    Procedure: AMPUTATION ABOVE KNEE;  Surgeon: Rosetta Posner, MD;  Location: [facility] OR;  Service: Vascular;  Laterality: Right;   Family History  Problem Relation Age of Onset  . Heart disease      No family history   History  Substance Use Topics  . Smoking status: Current Some Day Smoker    Types: Cigarettes  . Smokeless tobacco: Not on file  . Alcohol Use: No     Comment: everyday    Review of Systems  Constitutional:       Per HPI, otherwise negative  HENT:       Per HPI, otherwise negative  Respiratory:       Per HPI, otherwise negative  Cardiovascular:       Per HPI, otherwise negative  Gastrointestinal: Negative for vomiting.  Endocrine:       Negative aside from HPI  Genitourinary:       Neg aside from HPI   Musculoskeletal:       Per HPI, otherwise negative  Skin: Negative.   Neurological: Negative for syncope.      Allergies  Review of patient's allergies indicates no known allergies.  Home Medications   Prior to Admission medications    Medication Sig Start Date End Date Taking? Authorizing Provider  acetaminophen (TYLENOL) 325 MG tablet Take 2 tablets (650 mg total) by mouth every 6 (six) hours as needed for mild pain. 08/22/13   [name], MD  aspirin EC 81 MG tablet Take 81 mg by mouth daily.    Historical Provider, MD  atorvastatin (LIPITOR) 40 MG tablet Take 40 mg by mouth every evening. 04/07/13   [name], MD  carvedilol (COREG) 6.25 MG tablet Take 1 tablet (6.25 mg total) by mouth 2 (two) times daily with a meal. 04/07/13   [name], MD  escitalopram (LEXAPRO) 5 MG tablet Take 5 mg by mouth daily. 07/31/13   Historical Provider, MD  furosemide (LASIX) 40 MG tablet Take 40 mg by mouth daily as needed for fluid. 04/07/13   [name], MD  levofloxacin (LEVAQUIN) 500 MG tablet Take 1 tablet (500 mg total) by mouth daily. 01/20/14   Tanna Furry, MD  mirtazapine (REMERON) 15 MG tablet Take 7.5 mg by mouth at bedtime.    Historical Provider, MD  thiamine (VITAMIN B-1) 100 MG tablet Take 200 mg by mouth daily.    Historical Provider, MD  vitamin C (ASCORBIC ACID) 500 MG tablet  Take 500 mg by mouth daily.    Historical Provider, MD  zinc sulfate 220 MG capsule Take 220 mg by mouth daily.    Historical Provider, MD   BP 102/65  Pulse 95  Temp(Src) 99.6 F (37.6 C) (Oral)  Wt 105 lb 5 oz (47.769 kg)  SpO2 100% Physical Exam  Nursing note and vitals reviewed. Constitutional: He is oriented to person, place, and time. He appears well-developed. No distress.  HENT:  Head: Normocephalic and atraumatic.  Eyes: Conjunctivae and EOM are normal.  Cardiovascular: Normal rate and regular rhythm.   Pulmonary/Chest: Effort normal. No stridor. No respiratory distress.  Abdominal: He exhibits no distension.  Musculoskeletal: He exhibits no edema.  Left foot had an open wound on the distal dorsal surface medially. Including the entirety of the great right toe which is necrotic  Right AKA.  Neurological: He is  alert and oriented to person, place, and time.  Skin: Skin is warm and dry.  Psychiatric: He has a normal mood and affect.    ED Course  Procedures  DIAGNOSTIC STUDIES: Oxygen Saturation is 100% on room air, normal by my interpretation.    COORDINATION OF CARE: 1:53 PM Discussed treatment plan with pt at bedside and pt agreed to plan.   Labs Review Labs Reviewed - No data to display  Imaging Review Dg Foot Complete Left  01/20/2014   CLINICAL DATA:  Left foot pain, infection. Necrosis of the great toe.  EXAM: LEFT FOOT - COMPLETE 3+ VIEW  COMPARISON:  MRI of left foot 08/14/2013. Plain films of the left great toe 08/13/2013.  FINDINGS: Soft tissues of the foot and great toe are severely swollen. There is a wound projecting along the medial aspect of the great toe at the level of the IP joint. There has been progressive bony destructive change in the distal phalanx and head of the proximal phalanx of the great toe. All bones are markedly osteopenic. No fracture is identified.  IMPRESSION: Changes consistent with progressive osteomyelitis of the great toe where an open soft tissue wound is identified.   Electronically Signed   By: Inge Rise M.D.   On: 01/20/2014 18:31   I reviewed the electronic medical record. On exam the patient remains in similar condition. Patient states that he desires treatment for his wound infection  MDM   This patient with known vasculopathy presents with ongoing concern over a nonhealing left foot wound. Patient's x-ray from 2 days ago, physical exam both fit diagnosis of osteomyelitis. Patient is afebrile, hemodynamically stable, but given his comorbidities, patient started on antibiotics, admitted for further evaluation and management.     I personally performed the services described in this documentation, which was scribed in my presence. The recorded information has been reviewed and is accurate.       Carmin Muskrat, MD 01/22/14 [phone]

## 2014-01-22 NOTE — ED Notes (Signed)
Caregiver Helene Kelp [phone], she was contacted by myself and informed of patients transferred.  Report called to [facility] on [address], all questions answered.

## 2014-01-22 NOTE — Progress Notes (Signed)
ANTIBIOTIC CONSULT NOTE - INITIAL  Pharmacy Consult for Zosyn Indication: cellulitis   No Known Allergies  Patient Measurements: Height: 5\' 3"  (160 cm) Weight: 105 lb (47.628 kg) IBW/kg (Calculated) : 56.9 Adjusted Body Weight:   Vital Signs: Temp: 99.6 F (37.6 C) (10/22 1238) Temp Source: Oral (10/22 1238) BP: 102/65 mmHg (10/22 1238) Pulse Rate: 95 (10/22 1238) Intake/Output from previous day:   Intake/Output from this shift:    Labs:  Recent Labs  01/20/14 1456 01/22/14 1403  WBC 12.7* 12.1*  HGB 9.8* 10.2*  PLT 419* 460*  CREATININE 0.96 0.93   Estimated Creatinine Clearance: 50.5 ml/min (by C-G formula based on Cr of 0.93). No results found for this basename: VANCOTROUGH, VANCOPEAK, VANCORANDOM, GENTTROUGH, GENTPEAK, GENTRANDOM, TOBRATROUGH, TOBRAPEAK, TOBRARND, AMIKACINPEAK, AMIKACINTROU, AMIKACIN,  in the last 72 hours   Microbiology: Recent Results (from the past 720 hour(s))  CULTURE, BLOOD (ROUTINE X 2)     Status: None   Collection Time    01/20/14  5:20 PM      Result Value Ref Range Status   Specimen Description BLOOD LEFT ANTECUBITAL   Final   Special Requests     Final   Value: BOTTLES DRAWN AEROBIC AND ANAEROBIC AEB=8CC ANA=4CC   Culture NO GROWTH 2 DAYS   Final   Report Status PENDING   Incomplete  CULTURE, BLOOD (ROUTINE X 2)     Status: None   Collection Time    01/20/14  5:25 PM      Result Value Ref Range Status   Specimen Description BLOOD RIGHT ANTECUBITAL   Final   Special Requests     Final   Value: BOTTLES DRAWN AEROBIC AND ANAEROBIC AEB=8CC ANA=5CC   Culture NO GROWTH 2 DAYS   Final   Report Status PENDING   Incomplete    Medical History: Past Medical History  Diagnosis Date  . HTN (hypertension)   . Smoking hx 03/25/2013  . Alcohol abuse 03/25/2013  . CHF (congestive heart failure) 03/25/2013    Medications:  Scheduled:  . aspirin EC  81 mg Oral Daily  . atorvastatin  40 mg Oral QPM  . carvedilol  6.25 mg Oral BID  WC  . citalopram  20 mg Oral Daily  . heparin  5,000 Units Subcutaneous 3 times per day  . mirtazapine  7.5 mg Oral QHS  . thiamine  200 mg Oral Daily  . vitamin C  500 mg Oral Daily  . zinc sulfate  220 mg Oral Daily   Assessment: Patient being admitted from ED Pharmacy protocol Zosyn for cellulitis CrCl > 20 ml/min  Goal of Therapy:  Eradicate infection  Plan:  Zosyn 3.375 GM IV every 8 hours, infuse each dose over 4 hours Monitor renal function Labs per protocol  Abner Greenspan, Agueda Houpt Bennett 01/22/2014,4:31 PM

## 2014-01-22 NOTE — H&P (Signed)
[facility] History and Physical  Rodney [name] OXB:[id number] DOB: 03-08-1946 DOA: 01/22/2014  Referring physician: ER PCP: [name], NP   Chief Complaint: Foot pain.  HPI: Rodney [name] is a 68 y.o. male  This is a 68 year old man who has a previous history of above-knee right leg amputation secondary to peripheral vascular disease and now presents with foot pain on the left side. The pain is especially on the left great toe. The patient apparently was seen in the emergency room 2 days ago for the same problem but they've gotten worse. Evaluation in the emergency room shows an open wound on the distal dorsal surface medially of the left foot including the whole left great toe which looks necrotic. He is now being admitted for further management.   Review of Systems:  Constitutional:  No weight loss, night sweats, Fevers, chills, fatigue.  HEENT:  No headaches, Difficulty swallowing,Tooth/dental problems,Sore throat,  No sneezing, itching, ear ache, nasal congestion, post nasal drip,  Cardio-vascular:  No chest pain, Orthopnea, PND, swelling in lower extremities, anasarca, dizziness, palpitations  GI:  No heartburn, indigestion, abdominal pain, nausea, vomiting, diarrhea, change in bowel habits, loss of appetite  Resp:  No shortness of breath with exertion or at rest. No excess mucus, no productive cough, No non-productive cough, No coughing up of blood.No change in color of mucus.No wheezing.No chest wall deformity  Skin:  no rash or lesions.  GU:  no dysuria, change in color of urine, no urgency or frequency. No flank pain.  Marland Kitchen  Psych:  No change in mood or affect. No depression or anxiety. No memory loss.   Past Medical History  Diagnosis Date  . HTN (hypertension)   . Smoking hx 03/25/2013  . Alcohol abuse 03/25/2013  . CHF (congestive heart failure) 03/25/2013   Past Surgical History  Procedure Laterality Date  . Above knee leg amputation Right   . Amputation  Right 03/26/2013    Procedure: AMPUTATION ABOVE KNEE;  Surgeon: Rosetta Posner, MD;  Location: [place];  Service: Vascular;  Laterality: Right;   Social History:  reports that he has been smoking Cigarettes.  He has been smoking about 0.00 packs per day. He does not have any smokeless tobacco history on file. He reports that he uses illicit drugs (Marijuana). He reports that he does not drink alcohol.  No Known Allergies  Family History  Problem Relation Age of Onset  . Heart disease      No family history     Prior to Admission medications   Medication Sig Start Date End Date Taking? Authorizing Provider  acetaminophen (TYLENOL) 325 MG tablet Take 2 tablets (650 mg total) by mouth every 6 (six) hours as needed for mild pain. 08/22/13  Yes [name], MD  aspirin EC 81 MG tablet Take 81 mg by mouth daily.   Yes Historical Provider, MD  atorvastatin (LIPITOR) 40 MG tablet Take 40 mg by mouth every evening. 04/07/13  Yes [name], MD  carvedilol (COREG) 6.25 MG tablet Take 1 tablet (6.25 mg total) by mouth 2 (two) times daily with a meal. 04/07/13  Yes [name], MD  escitalopram (LEXAPRO) 5 MG tablet Take 5 mg by mouth daily. 07/31/13  Yes Historical Provider, MD  levofloxacin (LEVAQUIN) 500 MG tablet Take 1 tablet (500 mg total) by mouth daily. 01/20/14  Yes Tanna Furry, MD  mirtazapine (REMERON) 15 MG tablet Take 7.5 mg by mouth at bedtime.   Yes Historical Provider, MD  mupirocin ointment (BACTROBAN) 2 % Apply 1 application topically 2 (two) times daily.  01/21/14  Yes Historical Provider, MD  thiamine (VITAMIN B-1) 100 MG tablet Take 200 mg by mouth daily.   Yes Historical Provider, MD  vitamin C (ASCORBIC ACID) 500 MG tablet Take 500 mg by mouth daily.   Yes Historical Provider, MD  zinc sulfate 220 MG capsule Take 220 mg by mouth daily.   Yes Historical Provider, MD  furosemide (LASIX) 40 MG tablet Take 40 mg by mouth daily as needed for fluid. 04/07/13   [name], MD    Physical Exam: Filed Vitals:   01/22/14 1238 01/22/14 1407  BP: 102/65   Pulse: 95   Temp: 99.6 F (37.6 C)   TempSrc: Oral   Height:  5\' 3"  (1.6 m)  Weight: 47.769 kg (105 lb 5 oz) 47.628 kg (105 lb)  SpO2: 100%     Wt Readings from Last 3 Encounters:  01/22/14 47.628 kg (105 lb)  01/20/14 47.769 kg (105 lb 5 oz)  08/21/13 47.1 kg (103 lb 13.4 oz)    General:  Appears calm and comfortable Eyes: PERRL, normal lids, irises & conjunctiva ENT: grossly normal hearing, lips & tongue Neck: no LAD, masses or thyromegaly Cardiovascular: RRR, no m/r/g. No LE edema. Telemetry: SR, no arrhythmias  Respiratory: CTA bilaterally, no w/r/r. Normal respiratory effort. Abdomen: soft, ntnd Skin: no rash or induration seen on limited exam Musculoskeletal: Necrotic looking left foot mostly around the great toe. Psychiatric: grossly normal mood and affect, speech fluent and appropriate Neurologic: grossly non-focal.          Labs on Admission:  Basic Metabolic Panel:  Recent Labs Lab 01/20/14 1456 01/22/14 1403  NA 137 137  K 3.9 4.3  CL 100 101  CO2 24 24  GLUCOSE 116* 106*  BUN 19 17  CREATININE 0.96 0.93  CALCIUM 9.4 9.5   Liver Function Tests:  Recent Labs Lab 01/20/14 1456  AST 21  ALT 24  ALKPHOS 97  BILITOT 0.2*  PROT 8.4*  ALBUMIN 3.0*   No results found for this basename: LIPASE, AMYLASE,  in the last 168 hours No results found for this basename: AMMONIA,  in the last 168 hours CBC:  Recent Labs Lab 01/20/14 1456 01/22/14 1403  WBC 12.7* 12.1*  NEUTROABS 9.3* 8.6*  HGB 9.8* 10.2*  HCT 30.2* 31.9*  MCV 83.9 83.9  PLT 419* 460*   Cardiac Enzymes: No results found for this basename: CKTOTAL, CKMB, CKMBINDEX, TROPONINI,  in the last 168 hours  BNP (last 3 results)  Recent Labs  03/25/13 1240  PROBNP 14339.0*   CBG: No results found for this basename: GLUCAP,  in the last 168 hours  Radiological Exams on Admission: Dg Foot Complete  Left  01/20/2014   CLINICAL DATA:  Left foot pain, infection. Necrosis of the great toe.  EXAM: LEFT FOOT - COMPLETE 3+ VIEW  COMPARISON:  MRI of left foot 08/14/2013. Plain films of the left great toe 08/13/2013.  FINDINGS: Soft tissues of the foot and great toe are severely swollen. There is a wound projecting along the medial aspect of the great toe at the level of the IP joint. There has been progressive bony destructive change in the distal phalanx and head of the proximal phalanx of the great toe. All bones are markedly osteopenic. No fracture is identified.  IMPRESSION: Changes consistent with progressive osteomyelitis of the great toe where an open soft tissue wound is identified.  Electronically Signed   By: Inge Rise M.D.   On: 01/20/2014 18:31     Assessment/Plan   1. Left foot osteomyelitis. 2. Hypertension. 3. History of chronic systolic congestive heart failure, stable. 4. Anemia.  Plan: 1. Admit to medical floor. 2. Intravenous antibiotics. 3. Surgical consultation. He will likely need amputation of some sort.   Further recommendations will depend on patient's hospital progress.  Code Status: Full code.  DVT Prophylaxis: Heparin.  Family Communication: I discussed the plan with patient at the bedside.  Disposition Plan: Depending on progress.  Time spent: 60 minutes.  Doree Albee [facility] Pager [phone].

## 2014-01-22 NOTE — Progress Notes (Signed)
ANTIBIOTIC CONSULT NOTE-Preliminary  Pharmacy Consult for Vancomycin Indication: osteomyelitis  No Known Allergies  Patient Measurements: Weight: 105 lb 5 oz (47.769 kg)  Vital Signs: Temp: 99.6 F (37.6 C) (10/22 1238) Temp Source: Oral (10/22 1238) BP: 102/65 mmHg (10/22 1238) Pulse Rate: 95 (10/22 1238)  Labs:  Recent Labs  01/20/14 1456  WBC 12.7*  HGB 9.8*  PLT 419*  CREATININE 0.96   Microbiology: Recent Results (from the past 720 hour(s))  CULTURE, BLOOD (ROUTINE X 2)     Status: None   Collection Time    01/20/14  5:20 PM      Result Value Ref Range Status   Specimen Description BLOOD LEFT ANTECUBITAL   Final   Special Requests     Final   Value: BOTTLES DRAWN AEROBIC AND ANAEROBIC AEB=8CC ANA=4CC   Culture NO GROWTH 2 DAYS   Final   Report Status PENDING   Incomplete  CULTURE, BLOOD (ROUTINE X 2)     Status: None   Collection Time    01/20/14  5:25 PM      Result Value Ref Range Status   Specimen Description BLOOD RIGHT ANTECUBITAL   Final   Special Requests     Final   Value: BOTTLES DRAWN AEROBIC AND ANAEROBIC AEB=8CC ANA=5CC   Culture NO GROWTH 2 DAYS   Final   Report Status PENDING   Incomplete   Anti-infectives   Start     Dose/Rate Route Frequency Ordered Stop   01/22/14 1415  vancomycin (VANCOCIN) IVPB 1000 mg/200 mL premix     1,000 mg 200 mL/hr over 60 Minutes Intravenous  Once 01/22/14 1404       Assessment: 68yo male with necrotic toe.  Pt has been on Levaquin. SCr is at baseline.  Asked to initiate Vancomycin for osteomyelitis.  Pt has small body habitus.  Goal of Therapy:  Vancomycin trough level 15-20 mcg/ml  Plan:  Preliminary review of pertinent patient information completed.  Protocol will be initiated with a one-time dose of Vancomycin 1000mg  (~20mg /Kg).  Forestine Na clinical pharmacist will complete review when more data available to assess patient and finalize treatment regimen.  Hart Robinsons A, RPH 01/22/2014,2:05 PM

## 2014-01-23 DIAGNOSIS — M869 Osteomyelitis, unspecified: Principal | ICD-10-CM

## 2014-01-23 DIAGNOSIS — I5021 Acute systolic (congestive) heart failure: Secondary | ICD-10-CM

## 2014-01-23 LAB — COMPREHENSIVE METABOLIC PANEL
ALT: 27 U/L (ref 0–53)
AST: 26 U/L (ref 0–37)
Albumin: 2.9 g/dL — ABNORMAL LOW (ref 3.5–5.2)
Alkaline Phosphatase: 93 U/L (ref 39–117)
Anion gap: 15 (ref 5–15)
BUN: 19 mg/dL (ref 6–23)
CO2: 21 meq/L (ref 19–32)
CREATININE: 1.04 mg/dL (ref 0.50–1.35)
Calcium: 9.1 mg/dL (ref 8.4–10.5)
Chloride: 97 mEq/L (ref 96–112)
GFR calc Af Amer: 83 mL/min — ABNORMAL LOW (ref >90)
GFR, EST NON AFRICAN AMERICAN: 71 mL/min — AB (ref >90)
Glucose, Bld: 97 mg/dL (ref 70–99)
Potassium: 4.1 mEq/L (ref 3.7–5.3)
Sodium: 133 mEq/L — ABNORMAL LOW (ref 137–147)
Total Bilirubin: 0.2 mg/dL — ABNORMAL LOW (ref 0.3–1.2)
Total Protein: 8.1 g/dL (ref 6.0–8.3)

## 2014-01-23 LAB — CBC
HCT: 29 % — ABNORMAL LOW (ref 39.0–52.0)
HEMOGLOBIN: 9.2 g/dL — AB (ref 13.0–17.0)
MCH: 26.4 pg (ref 26.0–34.0)
MCHC: 31.7 g/dL (ref 30.0–36.0)
MCV: 83.3 fL (ref 78.0–100.0)
PLATELETS: 399 10*3/uL (ref 150–400)
RBC: 3.48 MIL/uL — ABNORMAL LOW (ref 4.22–5.81)
RDW: 13.9 % (ref 11.5–15.5)
WBC: 10.4 10*3/uL (ref 4.0–10.5)

## 2014-01-23 NOTE — Progress Notes (Addendum)
ANTIBIOTIC CONSULT NOTE - INITIAL  Pharmacy Consult for Zosyn and Vancomycin Indication: osteomyelitis  No Known Allergies  Patient Measurements: Height: 5\' 3"  (160 cm) Weight: 105 lb (47.628 kg) IBW/kg (Calculated) : 56.9  Vital Signs: Temp: 98.3 F (36.8 C) (10/23 0430) Temp Source: Oral (10/23 0430) BP: 108/52 mmHg (10/23 0700) Pulse Rate: 86 (10/23 0700) Intake/Output from previous day: 10/22 0701 - 10/23 0700 In: 174.2 [P.O.:120; I.V.:4.2; IV Piggyback:50] Out: -  Intake/Output from this shift: Total I/O In: 240 [P.O.:240] Out: -   Labs:  Recent Labs  01/20/14 1456 01/22/14 1403 01/23/14 0527  WBC 12.7* 12.1* 10.4  HGB 9.8* 10.2* 9.2*  PLT 419* 460* 399  CREATININE 0.96 0.93 1.04   Estimated Creatinine Clearance: 45.1 ml/min (by C-G formula based on Cr of 1.04). No results found for this basename: VANCOTROUGH, VANCOPEAK, VANCORANDOM, GENTTROUGH, GENTPEAK, GENTRANDOM, TOBRATROUGH, TOBRAPEAK, TOBRARND, AMIKACINPEAK, AMIKACINTROU, AMIKACIN,  in the last 72 hours   Microbiology: Recent Results (from the past 720 hour(s))  CULTURE, BLOOD (ROUTINE X 2)     Status: None   Collection Time    01/20/14  5:20 PM      Result Value Ref Range Status   Specimen Description BLOOD LEFT ANTECUBITAL   Final   Special Requests     Final   Value: BOTTLES DRAWN AEROBIC AND ANAEROBIC AEB=8CC ANA=4CC   Culture NO GROWTH 2 DAYS   Final   Report Status PENDING   Incomplete  CULTURE, BLOOD (ROUTINE X 2)     Status: None   Collection Time    01/20/14  5:25 PM      Result Value Ref Range Status   Specimen Description BLOOD RIGHT ANTECUBITAL   Final   Special Requests     Final   Value: BOTTLES DRAWN AEROBIC AND ANAEROBIC AEB=8CC ANA=5CC   Culture NO GROWTH 2 DAYS   Final   Report Status PENDING   Incomplete   Medical History: Past Medical History  Diagnosis Date  . HTN (hypertension)   . Smoking hx 03/25/2013  . Alcohol abuse 03/25/2013  . CHF (congestive heart failure)  03/25/2013   Medications:  Scheduled:  . aspirin EC  81 mg Oral Daily  . atorvastatin  40 mg Oral QPM  . carvedilol  6.25 mg Oral BID WC  . citalopram  20 mg Oral Daily  . heparin  5,000 Units Subcutaneous 3 times per day  . mirtazapine  7.5 mg Oral QHS  . piperacillin-tazobactam (ZOSYN)  IV  3.375 g Intravenous Q8H  . thiamine  200 mg Oral Daily  . vancomycin  500 mg Intravenous Q12H  . vitamin C  500 mg Oral Daily  . zinc sulfate  220 mg Oral Daily   Assessment: 68yo male with osteomyelitis of toe.  Received initial dose of Vancomycin on admission Normalized ClCr > 45ml/min.  Pt is small with decreased muscle mass therefore accurate ClCr difficult to assess.  Goal of Therapy:  Eradicate infection Vancomycin trough level 15-76mcg/ml  Plan:  Zosyn 3.375 GM IV every 8 hours, each dose over 4 hours Vancomycin 500mg  IV q12hrs Check trough at steady state Monitor renal function Labs per protocol  Hart Robinsons A 01/23/2014,10:45 AM

## 2014-01-23 NOTE — Consult Note (Signed)
Patient seen and chart reviewed. Patient was admitted with worsening osteomyelitis and gangrene of left foot. 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 He had an arteriogram done by vascular surgery in May of this year. He has worsening disease and I  would recommend transfer to [facility] for further management and treatment by vascular surgery. This was discussed with Dr. Jerilee Hoh who will arrange transfer to the hospitalist service in [place].

## 2014-01-23 NOTE — Progress Notes (Signed)
Pt is waiting for [facility] to arrive to transport him to an ortho floor at [facility]. Pt waiting patiently and understands he is being transferred

## 2014-01-23 NOTE — Progress Notes (Addendum)
[facility] PROGRESS NOTE  Rodney [name] TDS:[id number] DOB: [birth year] DOA: 01/22/2014 PCP: [name], NP  Assessment/Plan: Osteomyelitis of Left Great Toe -Patient is now consenting to treatment. -Continue IV antibiotics. -Seen at [facility] by Dr. Arnoldo Morale, who believes transfer to [facility] is in order to be assessed by vascular surgery for potential amputation. Discussed with Dr. [name] who will see patient on arrival.  HTN -Well controlled.  Chronic Systolic CHF -Compensated.  Code Status: Full Code Family Communication: Patient only  Disposition Plan: Transfer to [facility]   Consultants:  Vascular, Dr. [name]   Antibiotics:  Vanc  Zosyn   Subjective: Flat affect, no complaints. Sitting in wheelchair  Objective: Filed Vitals:   01/22/14 1811 01/22/14 2042 01/23/14 0430 01/23/14 0700  BP: 114/51 112/57 111/55 108/52  Pulse: 94 86 83 86  Temp: 99.9 F (37.7 C) 99.1 F (37.3 C) 98.3 F (36.8 C)   TempSrc: Oral Oral Oral   Resp: 18 18 18    Height:      Weight:      SpO2: 94% 100% 100% 99%    Intake/Output Summary (Last 24 hours) at 01/23/14 1347 Last data filed at 01/23/14 0915  Gross per 24 hour  Intake 414.17 ml  Output      0 ml  Net 414.17 ml   Filed Weights   01/22/14 1238 01/22/14 1407  Weight: 47.769 kg (105 lb 5 oz) 47.628 kg (105 lb)    Exam:   General:  AA Ox3  Cardiovascular: RRR  Respiratory: CTA B  Abdomen: S/NT/ND/+BS  Extremities: Necrotic looking left big toe.   Neurologic:  Non-focal  Data Reviewed: Basic Metabolic Panel:  Recent Labs Lab 01/20/14 1456 01/22/14 1403 01/23/14 0527  NA 137 137 133*  K 3.9 4.3 4.1  CL 100 101 97  CO2 24 24 21   GLUCOSE 116* 106* 97  BUN 19 17 19   CREATININE 0.96 0.93 1.04  CALCIUM 9.4 9.5 9.1   Liver Function Tests:  Recent Labs Lab 01/20/14 1456 01/23/14 0527  AST 21 26  ALT 24 27  ALKPHOS 97 93  BILITOT 0.2* 0.2*  PROT 8.4* 8.1  ALBUMIN 3.0* 2.9*   No results found for  this basename: LIPASE, AMYLASE,  in the last 168 hours No results found for this basename: AMMONIA,  in the last 168 hours CBC:  Recent Labs Lab 01/20/14 1456 01/22/14 1403 01/23/14 0527  WBC 12.7* 12.1* 10.4  NEUTROABS 9.3* 8.6*  --   HGB 9.8* 10.2* 9.2*  HCT 30.2* 31.9* 29.0*  MCV 83.9 83.9 83.3  PLT 419* 460* 399   Cardiac Enzymes: No results found for this basename: CKTOTAL, CKMB, CKMBINDEX, TROPONINI,  in the last 168 hours BNP (last 3 results)  Recent Labs  03/25/13 1240  PROBNP 14339.0*   CBG: No results found for this basename: GLUCAP,  in the last 168 hours  Recent Results (from the past 240 hour(s))  CULTURE, BLOOD (ROUTINE X 2)     Status: None   Collection Time    01/20/14  5:20 PM      Result Value Ref Range Status   Specimen Description BLOOD LEFT ANTECUBITAL   Final   Special Requests     Final   Value: BOTTLES DRAWN AEROBIC AND ANAEROBIC AEB=8CC ANA=4CC   Culture NO GROWTH 2 DAYS   Final   Report Status PENDING   Incomplete  CULTURE, BLOOD (ROUTINE X 2)     Status: None   Collection Time  01/20/14  5:25 PM      Result Value Ref Range Status   Specimen Description BLOOD RIGHT ANTECUBITAL   Final   Special Requests     Final   Value: BOTTLES DRAWN AEROBIC AND ANAEROBIC AEB=8CC ANA=5CC   Culture NO GROWTH 2 DAYS   Final   Report Status PENDING   Incomplete     Studies: No results found.  Scheduled Meds: . aspirin EC  81 mg Oral Daily  . atorvastatin  40 mg Oral QPM  . carvedilol  6.25 mg Oral BID WC  . citalopram  20 mg Oral Daily  . heparin  5,000 Units Subcutaneous 3 times per day  . mirtazapine  7.5 mg Oral QHS  . piperacillin-tazobactam (ZOSYN)  IV  3.375 g Intravenous Q8H  . thiamine  200 mg Oral Daily  . vancomycin  500 mg Intravenous Q12H  . vitamin C  500 mg Oral Daily  . zinc sulfate  220 mg Oral Daily   Continuous Infusions:   Active Problems:   HTN (hypertension)   Chronic systolic CHF (congestive heart failure)    Noncompliance   Osteomyelitis of left foot   Cellulitis of foot    Time spent: 35 minutes. Greater than 50% of this time was spent in direct contact with the patient coordinating care.    Lelon Frohlich  [facility] Pager [phone]  If 7PM-7AM, please contact night-coverage at [email or web address], password [facility] 01/23/2014, 1:47 PM  LOS: 1 day

## 2014-01-24 ENCOUNTER — Inpatient Hospital Stay (HOSPITAL_COMMUNITY): Payer: Medicare Other

## 2014-01-24 DIAGNOSIS — I70262 Atherosclerosis of native arteries of extremities with gangrene, left leg: Secondary | ICD-10-CM

## 2014-01-24 LAB — PROTIME-INR
INR: 1.28 (ref 0.00–1.49)
PROTHROMBIN TIME: 16.2 s — AB (ref 11.6–15.2)

## 2014-01-24 NOTE — Consult Note (Signed)
Cardiology Consult Note  Admit date: 01/22/2014 Name: Rodney [name] 68 y.o.  male DOB:  [birth year] MRN:  [id number]  Today's date:  01/24/2014  Reason for Consultation:   preop cardiac evaluation  IMPRESSIONS: 1. From a cardiovascular viewpoint, the patient's risk of surgery is increased do to his cardiomyopathy, general medical condition although his cardiac status is currently compensated in no additional optimization needs to be done. 2. Ischemic cardiomyopathy 3. Severe peripheral vascular disease 4. Hypertension 5. Moderate aortic regurgitation  RECOMMENDATION: May proceed with surgery from a cardiovascular viewpoint. Will need to be monitored on telemetry postoperatively. Continue usual cardiac medications. His echocardiogram shows an EF of 20-25% with moderate aortic regurgitation.  HISTORY: 68 year old male with past medical history of cardiomyopathy and coronary artery disease for preoperative evaluation prior to peripheral vascular surgery.  He had a right AKA for osteoyelitis in December and subsequently was found to have a low EF.  Patient had a cardiac catheterization in December of 2014 following that. At that time he was found to have a 30% left main, 40% ostial LAD, 80-90% proximal LAD, 80% first diagonal, occluded small ramus, occluded small first marginal, occluded second marginal and a 50% circumflex. There was a 75% mid right coronary artery followed by a 75% lesion. Echocardiogram December 2014 showed an ejection fraction of 15%. There was moderate to severe aortic insufficiency. There was moderate to severe mitral regurgitation. There was biatrial enlargement and right ventricle was moderately dilated. There was severe tricuspid regurgitation. Cardiac MRI in December of 2014 showed an ejection fraction of 23%. There was some endocardial scar involving the anterolateral, lateral and inferior apical wall. No areas of nonviability. Picture most suggestive of nonischemic  cardiomyopathy. There is a small to moderate pericardial effusion. Cardiomyopathy felt to mixed related to CAD and ETOH. Patient felt not to be a good candidate for coronary artery bypassing graft. PCI was considered but not clear patient would be compliant with antiplatelet medications. Patient treated medically. Patient was to followup in the CHF clinic but did not keep his appointment. . Admitted on May 13 with osteomyelitis of first digit left lower extremity that was not treated surgically.  Also found to have acute renal insufficiency. Returns with gangrene and has been seen by vascular surgery and we are asked to evaluate preoperatively prior to peripheral vascular surgery. Patient denies dyspnea, chest pain, palpitations or syncope. Current echocardiogram shows EF of 20-25%.   Past Medical History  Diagnosis Date  . HTN (hypertension)   . Smoking hx 03/25/2013  . Alcohol abuse 03/25/2013  . CHF (congestive heart failure) 03/25/2013      Past Surgical History  Procedure Laterality Date  . Above knee leg amputation Right   . Amputation Right 03/26/2013    Procedure: AMPUTATION ABOVE KNEE;  Surgeon: Rosetta Posner, MD;  Location: [facility];  Service: Vascular;  Laterality: Right;     Allergies:  has No Known Allergies.   Medications: Prior to Admission medications   Medication Sig Start Date End Date Taking? Authorizing Provider  acetaminophen (TYLENOL) 325 MG tablet Take 2 tablets (650 mg total) by mouth every 6 (six) hours as needed for mild pain. 08/22/13  Yes [name], MD  aspirin EC 81 MG tablet Take 81 mg by mouth daily.   Yes Historical Provider, MD  atorvastatin (LIPITOR) 40 MG tablet Take 40 mg by mouth every evening. 04/07/13  Yes [name], MD  carvedilol (COREG) 6.25 MG tablet Take 1 tablet (6.25 mg total)  by mouth 2 (two) times daily with a meal. 04/07/13  Yes [name], MD  escitalopram (LEXAPRO) 5 MG tablet Take 5 mg by mouth daily. 07/31/13  Yes  Historical Provider, MD  furosemide (LASIX) 40 MG tablet Take 40 mg by mouth daily as needed for fluid. 04/07/13  Yes [name], MD  levofloxacin (LEVAQUIN) 500 MG tablet Take 1 tablet (500 mg total) by mouth daily. 01/20/14  Yes Tanna Furry, MD  mirtazapine (REMERON) 15 MG tablet Take 7.5 mg by mouth at bedtime.   Yes Historical Provider, MD  mupirocin ointment (BACTROBAN) 2 % Apply 1 application topically 2 (two) times daily.  01/21/14  Yes Historical Provider, MD  thiamine (VITAMIN B-1) 100 MG tablet Take 200 mg by mouth daily.   Yes Historical Provider, MD  vitamin C (ASCORBIC ACID) 500 MG tablet Take 500 mg by mouth daily.   Yes Historical Provider, MD  zinc sulfate 220 MG capsule Take 220 mg by mouth daily.   Yes Historical Provider, MD   Social History:   reports that he has been smoking Cigarettes.  He has been smoking about 0.00 packs per day. He does not have any smokeless tobacco history on file. He reports that he uses illicit drugs (Marijuana). He reports that he does not drink alcohol.   History   Social History Narrative  . No narrative on file   Review of Systems: Otherwise negative except as noted above  Physical Exam: BP 117/49  Pulse 85  Temp(Src) 97.9 F (36.6 C) (Oral)  Resp 18  Ht 5\' 3"  (1.6 m)  Wt 47.628 kg (105 lb)  BMI 18.60 kg/m2  SpO2 100%  General appearance: Then elderly black male in no acute distress Head: Normocephalic, without obvious abnormality, atraumatic Neck: no adenopathy, no carotid bruit, no JVD and supple, symmetrical, trachea midline Lungs: clear to auscultation bilaterally Heart: Normal S1-S2, no S3, 5-9/1 diastolic murmur, 6-3/8 systolic murmur, soft S3 Abdomen: soft, non-tender; bowel sounds normal; no masses,  no organomegaly Rectal: deferred Extremities: Right AKA, diminished pulses in the left foot with gangrene of the toe Neurologic: Grossly normal  Labs: CBC  Recent Labs  01/22/14 1403 01/23/14 0527  WBC 12.1* 10.4   RBC 3.80* 3.48*  HGB 10.2* 9.2*  HCT 31.9* 29.0*  PLT 460* 399  MCV 83.9 83.3  MCH 26.8 26.4  MCHC 32.0 31.7  RDW 14.0 13.9  LYMPHSABS 2.0  --   MONOABS 0.9  --   EOSABS 0.5  --   BASOSABS 0.0  --    CMP   Recent Labs  01/23/14 0527  NA 133*  K 4.1  CL 97  CO2 21  GLUCOSE 97  BUN 19  CREATININE 1.04  CALCIUM 9.1  PROT 8.1  ALBUMIN 2.9*  AST 26  ALT 27  ALKPHOS 93  BILITOT 0.2*  GFRNONAA 71*  GFRAA 83*   EKG: Nonspecific ST changes, sinus rhythm  Radiology: Cardiomegaly, streaky atelectasis base, no heart failure  Signed:  W. Doristine Church MD [facility]   Cardiology Consultant  01/24/2014, 3:42 PM

## 2014-01-24 NOTE — Progress Notes (Signed)
Pt. suspected smoking in his room. Pt denies smoking. Pt advised not to smoke in his room. Charge nurse notified.  Cigarette and lighter confiscated, placed in bag with name tag  until discharged. Security notified.

## 2014-01-24 NOTE — Progress Notes (Signed)
Patient Demographics  Rodney [name], is a 68 y.o. male, DOB - 23-Nov-1945, MWU:[id number]  Admit date - 01/22/2014   Admitting Physician Thurnell Lose, MD  Outpatient Primary MD for the patient is [name], NP  LOS - 2   Chief Complaint  Patient presents with  . Foot Pain        Subjective:   Rodney [name] today has, No headache, No chest pain, No abdominal pain - No Nausea, No new weakness tingling or numbness, No Cough - SOB.    Assessment & Plan    1. 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 Currently no signs of sepsis, does not appear toxic, on empiric antibiotics which will be continued, seen by vascular surgeon Dr. [name], he is not a candidate for bypass surgery, likely left AKA once patient is agreeable, had a complex postop course during his recent right AKA where he nearly coded. We will have cardiology evaluate the patient preop.   2. Ischemic cardiomyopathy with Chronic severe systolic heart failure with EF of 15% with diffuse wall motion abnormality and moderate AR on echogram one year ago - he currently appears compensated, he is currently on aspirin, Coreg, fluid restriction, not on ACE/ARB will defer this to cardiology likely better to a start postop if needed. Strict intake and output monitor. Postop will likely send him to step down for closely monitoring his hemodynamics. Repeat echogram. Check baseline EKG and chest x-ray.   3. HTN (hypertension) - stable on Coreg monitor.   4. History of smoking. Counseled to quit.   5. History of dyslipidemia. Continue home dose statin.    6. CAD. Compensated from that standpoint, continue comminution of aspirin, beta blocker and statin for secondary prevention. Counseled  to quit smoking.      Code Status: Full  Family Communication: none present  Disposition Plan: TBD   Procedures TTE   Consults  Vas Surg, Cards   Medications  Scheduled Meds: . aspirin EC  81 mg Oral Daily  . atorvastatin  40 mg Oral QPM  . carvedilol  6.25 mg Oral BID WC  . citalopram  20 mg Oral Daily  . heparin  5,000 Units Subcutaneous 3 times per day  . mirtazapine  7.5 mg Oral QHS  . piperacillin-tazobactam (ZOSYN)  IV  3.375 g Intravenous Q8H  . thiamine  200 mg Oral Daily  . vancomycin  500 mg Intravenous Q12H  . vitamin C  500 mg Oral Daily  . zinc sulfate  220 mg Oral Daily   Continuous Infusions:  PRN Meds:.acetaminophen, ondansetron (ZOFRAN) IV, ondansetron  DVT Prophylaxis   Heparin    Lab Results  Component Value Date   PLT 399 01/23/2014    Antibiotics     Anti-infectives   Start     Dose/Rate Route Frequency Ordered Stop   01/23/14 0200  vancomycin (VANCOCIN) 500 mg in sodium chloride 0.9 % 100 mL IVPB     500 mg 100 mL/hr over 60 Minutes Intravenous Every 12 hours 01/22/14 1710     01/22/14 1800  piperacillin-tazobactam (ZOSYN) IVPB 3.375 g     3.375 g 12.5 mL/hr over 240 Minutes Intravenous Every 8  hours 01/22/14 1630     01/22/14 1415  vancomycin (VANCOCIN) IVPB 1000 mg/200 mL premix     1,000 mg 200 mL/hr over 60 Minutes Intravenous  Once 01/22/14 1404 01/22/14 1644          Objective:   Filed Vitals:   01/23/14 1427 01/23/14 2052 01/23/14 2227 01/24/14 0522  BP: 105/49 112/62 112/56 106/54  Pulse: 78 88 86 83  Temp: 98.1 F (36.7 C) 99.2 F (37.3 C) 99.2 F (37.3 C) 97.7 F (36.5 C)  TempSrc: Oral Oral    Resp: 18 20 20 18   Height:      Weight:      SpO2: 100% 100% 98% 97%    Wt Readings from Last 3 Encounters:  01/22/14 47.628 kg (105 lb)  01/20/14 47.769 kg (105 lb 5 oz)  08/21/13 47.1 kg (103 lb 13.4 oz)     Intake/Output Summary (Last 24 hours) at 01/24/14 1042 Last data filed at 01/24/14 0524  Gross  per 24 hour  Intake      0 ml  Output   1000 ml  Net  -1000 ml     Physical Exam  Awake Alert, Oriented X 3, No new F.N deficits, Normal affect [place].AT,PERRAL Supple Neck,No JVD, No cervical lymphadenopathy appriciated.  Symmetrical Chest wall movement, Good air movement bilaterally, CTAB RRR,No Gallops,Rubs, positive systolic Murmur, No Parasternal Heave +ve B.Sounds, Abd Soft, No tenderness, No organomegaly appriciated, No rebound - guarding or rigidity. No Cyanosis, Clubbing or edema, No new Rash or bruise , R AKA, L 1st is black and appears to have dry gangrene, left foot and leg also swollen, extremely poor pedal pulse on the left foot.   Data Review   Micro Results Recent Results (from the past 240 hour(s))  CULTURE, BLOOD (ROUTINE X 2)     Status: None   Collection Time    01/20/14  5:20 PM      Result Value Ref Range Status   Specimen Description BLOOD LEFT ANTECUBITAL   Final   Special Requests     Final   Value: BOTTLES DRAWN AEROBIC AND ANAEROBIC AEB=8CC ANA=4CC   Culture NO GROWTH 4 DAYS   Final   Report Status PENDING   Incomplete  CULTURE, BLOOD (ROUTINE X 2)     Status: None   Collection Time    01/20/14  5:25 PM      Result Value Ref Range Status   Specimen Description BLOOD RIGHT ANTECUBITAL   Final   Special Requests     Final   Value: BOTTLES DRAWN AEROBIC AND ANAEROBIC AEB=8CC ANA=5CC   Culture NO GROWTH 4 DAYS   Final   Report Status PENDING   Incomplete    Radiology Reports Dg Foot Complete Left  01/20/2014   CLINICAL DATA:  Left foot pain, infection. Necrosis of the great toe.  EXAM: LEFT FOOT - COMPLETE 3+ VIEW  COMPARISON:  MRI of left foot 08/14/2013. Plain films of the left great toe 08/13/2013.  FINDINGS: Soft tissues of the foot and great toe are severely swollen. There is a wound projecting along the medial aspect of the great toe at the level of the IP joint. There has been progressive bony destructive change in the distal phalanx and head of  the proximal phalanx of the great toe. All bones are markedly osteopenic. No fracture is identified.  IMPRESSION: Changes consistent with progressive osteomyelitis of the great toe where an open soft tissue wound is identified.   Electronically  Signed   By: Inge Rise M.D.   On: 01/20/2014 18:31     CBC  Recent Labs Lab 01/20/14 1456 01/22/14 1403 01/23/14 0527  WBC 12.7* 12.1* 10.4  HGB 9.8* 10.2* 9.2*  HCT 30.2* 31.9* 29.0*  PLT 419* 460* 399  MCV 83.9 83.9 83.3  MCH 27.2 26.8 26.4  MCHC 32.5 32.0 31.7  RDW 13.9 14.0 13.9  LYMPHSABS 1.9 2.0  --   MONOABS 1.0 0.9  --   EOSABS 0.4 0.5  --   BASOSABS 0.0 0.0  --     Chemistries   Recent Labs Lab 01/20/14 1456 01/22/14 1403 01/23/14 0527  NA 137 137 133*  K 3.9 4.3 4.1  CL 100 101 97  CO2 24 24 21   GLUCOSE 116* 106* 97  BUN 19 17 19   CREATININE 0.96 0.93 1.04  CALCIUM 9.4 9.5 9.1  AST 21  --  26  ALT 24  --  27  ALKPHOS 97  --  93  BILITOT 0.2*  --  0.2*   ------------------------------------------------------------------------------------------------------------------ estimated creatinine clearance is 45.1 ml/min (by C-G formula based on Cr of 1.04). ------------------------------------------------------------------------------------------------------------------ No results found for this basename: HGBA1C,  in the last 72 hours ------------------------------------------------------------------------------------------------------------------ No results found for this basename: CHOL, HDL, LDLCALC, TRIG, CHOLHDL, LDLDIRECT,  in the last 72 hours ------------------------------------------------------------------------------------------------------------------ No results found for this basename: TSH, T4TOTAL, FREET3, T3FREE, THYROIDAB,  in the last 72 hours ------------------------------------------------------------------------------------------------------------------ No results found for this basename:  VITAMINB12, FOLATE, FERRITIN, TIBC, IRON, RETICCTPCT,  in the last 72 hours  Coagulation profile No results found for this basename: INR, PROTIME,  in the last 168 hours  No results found for this basename: DDIMER,  in the last 72 hours  Cardiac Enzymes No results found for this basename: CK, CKMB, TROPONINI, MYOGLOBIN,  in the last 168 hours ------------------------------------------------------------------------------------------------------------------ No components found with this basename: POCBNP,      Time Spent in minutes   35   Carrie Schoonmaker K M.D on 01/24/2014 at 10:42 AM  Between 7am to 7pm - Pager - [phone]  After 7pm go to [email or web address] - password [email or web address]  And look for the night coverage person covering for me after hours  [facility] Office  [phone]

## 2014-01-24 NOTE — Progress Notes (Signed)
Pt. given  option for a nicotine patch to prevent smoking in his room but pt. refused. Pt. Stated " nicotine patch doesn't do anything for me".

## 2014-01-24 NOTE — Progress Notes (Signed)
  Echocardiogram 2D Echocardiogram has been performed.  Donata Clay 01/24/2014, 1:56 PM

## 2014-01-24 NOTE — Consult Note (Signed)
Referred by:  [facility]  Reason for referral: L foot gangrene  History of Present Illness  Rodney [name] is a 68 y.o. (12/09/1945) male who presents with chief complaint: Left great toe gangrene.  This patient is well known to our service from prior episodes of right dead foot requiring amputation and subsequent development of Left great toe osteomyelitis and dry gangrene in May 2015.  At that time, I had recommended minimally a great toe amputation, which he refused.  He was risk stratified by Cardiology at that time as high risk.  During that admission, he also had a left leg angiogram which demonstrated a higher risk of failure for a BKA.  He returns today after transfer from [facility] with possible left great toe wet gangrene.  Reported he is willing to proceed with great toe amputation at this time.    Past Medical History  Diagnosis Date  . HTN (hypertension)   . Smoking hx 03/25/2013  . Alcohol abuse 03/25/2013  . CHF (congestive heart failure) 03/25/2013    Past Surgical History  Procedure Laterality Date  . Above knee leg amputation Right   . Amputation Right 03/26/2013    Procedure: AMPUTATION ABOVE KNEE;  Surgeon: Rosetta Posner, MD;  Location: [facility];  Service: Vascular;  Laterality: Right;    History   Social History  . Marital Status: Single    Spouse Name: N/A    Number of Children: N/A  . Years of Education: N/A   Occupational History  . Not on file.   Social History Main Topics  . Smoking status: Current Some Day Smoker    Types: Cigarettes  . Smokeless tobacco: Not on file  . Alcohol Use: No     Comment: everyday  . Drug Use: Yes    Special: Marijuana  . Sexual Activity: No   Other Topics Concern  . Not on file   Social History Narrative  . No narrative on file    Family History  Problem Relation Age of Onset  . Heart disease      No family history    No current facility-administered medications on file prior to encounter.   Current  Outpatient Prescriptions on File Prior to Encounter  Medication Sig Dispense Refill  . acetaminophen (TYLENOL) 325 MG tablet Take 2 tablets (650 mg total) by mouth every 6 (six) hours as needed for mild pain.      Marland Kitchen aspirin EC 81 MG tablet Take 81 mg by mouth daily.      Marland Kitchen atorvastatin (LIPITOR) 40 MG tablet Take 40 mg by mouth every evening.      . carvedilol (COREG) 6.25 MG tablet Take 1 tablet (6.25 mg total) by mouth 2 (two) times daily with a meal.      . escitalopram (LEXAPRO) 5 MG tablet Take 5 mg by mouth daily.      . furosemide (LASIX) 40 MG tablet Take 40 mg by mouth daily as needed for fluid.      Marland Kitchen levofloxacin (LEVAQUIN) 500 MG tablet Take 1 tablet (500 mg total) by mouth daily.  10 tablet  0  . mirtazapine (REMERON) 15 MG tablet Take 7.5 mg by mouth at bedtime.      . thiamine (VITAMIN B-1) 100 MG tablet Take 200 mg by mouth daily.      . vitamin C (ASCORBIC ACID) 500 MG tablet Take 500 mg by mouth daily.      Marland Kitchen zinc sulfate 220 MG capsule Take  220 mg by mouth daily.        No Known Allergies   REVIEW OF SYSTEMS:  (Positives checked otherwise negative)  CARDIOVASCULAR:  []  chest pain, []  chest pressure, []  palpitations, []  shortness of breath when laying flat, [x]  shortness of breath with exertion,  []  pain in feet when walking, []  pain in feet when laying flat, []  history of blood clot in veins (DVT), []  history of phlebitis, []  swelling in legs, []  varicose veins  PULMONARY:  []  productive cough, []  asthma, []  wheezing  NEUROLOGIC:  []  weakness in arms or legs, []  numbness in arms or legs, []  difficulty speaking or slurred speech, []  temporary loss of vision in one eye, []  dizziness  HEMATOLOGIC:  []  bleeding problems, []  problems with blood clotting too easily  MUSCULOSKEL:  []  joint pain, []  joint swelling, [x]  R AKA  GASTROINTEST:  []  vomiting blood, []  blood in stool     GENITOURINARY:  []  burning with urination, []  blood in urine  PSYCHIATRIC:  []  history of  major depression  INTEGUMENTARY:  []  rashes, [x]  ulcers  CONSTITUTIONAL:  []  fever, []  chills   Physical Examination  Filed Vitals:   01/23/14 1427 01/23/14 2052 01/23/14 2227 01/24/14 0522  BP: 105/49 112/62 112/56 106/54  Pulse: 78 88 86 83  Temp: 98.1 F (36.7 C) 99.2 F (37.3 C) 99.2 F (37.3 C) 97.7 F (36.5 C)  TempSrc: Oral Oral    Resp: 18 20 20 18   Height:      Weight:      SpO2: 100% 100% 98% 97%   Body mass index is 18.6 kg/(m^2).  General: A&O x 3, WD, thin, stooped over  Head: [place]/AT  Ear/Nose/Throat: Hearing grossly intact, nares w/o erythema or drainage, oropharynx w/o Erythema/Exudate, Mallampati score: 2  Eyes: PERRLA, EOMI  Neck: Supple, no nuchal rigidity, no palpable LAD  Pulmonary: Sym exp, good air movt, CTAB, no rales, rhonchi, & wheezing  Cardiac: RRR, Nl S1, S2, no Murmurs, rubs or gallops  Vascular: Vessel Right Left  Radial Palpable Palpable  Brachial Palpable Palpable  Carotid Palpable, without bruit Palpable, without bruit  Aorta Not palpable N/A  Femoral Palpable Palpable  Popliteal AKA Not palpable  PT AKA Not Palpable  DP AKA Not Palpable   Gastrointestinal: soft, NTND, -G/R, - HSM, - masses, - CVAT B  Musculoskeletal: M/S 5/5 throughout except R AKA, +kyphotic spine, R AKA, L foot: great toe gangrene with odor, no obvious drainage, dead skin extending onto dorsal surface overlying MT 1-2, ischemia extending pass TMA level, ischemic plantar surface, no ascending cellulitis  Neurologic: CN 2-12 grossly intact, Pain and light touch intact in extremities except R foot, Motor exam as listed above  Psychiatric: Judgment intact, Mood & affect appropriate for pt's clinical situation, angry  Dermatologic: See M/S exam for extremity exam, no rashes otherwise noted  Lymph : No Cervical, Axillary, or Inguinal lymphadenopathy   Laboratory: CBC:    Component Value Date/Time   WBC 10.4 01/23/2014 0527   RBC 3.48* 01/23/2014 0527    HGB 9.2* 01/23/2014 0527   HCT 29.0* 01/23/2014 0527   PLT 399 01/23/2014 0527   MCV 83.3 01/23/2014 0527   MCH 26.4 01/23/2014 0527   MCHC 31.7 01/23/2014 0527   RDW 13.9 01/23/2014 0527   LYMPHSABS 2.0 01/22/2014 1403   MONOABS 0.9 01/22/2014 1403   EOSABS 0.5 01/22/2014 1403   BASOSABS 0.0 01/22/2014 1403    BMP:    Component Value Date/Time  NA 133* 01/23/2014 0527   K 4.1 01/23/2014 0527   CL 97 01/23/2014 0527   CO2 21 01/23/2014 0527   GLUCOSE 97 01/23/2014 0527   BUN 19 01/23/2014 0527   CREATININE 1.04 01/23/2014 0527   CALCIUM 9.1 01/23/2014 0527   GFRNONAA 71* 01/23/2014 0527   GFRAA 83* 01/23/2014 0527    Coagulation: Lab Results  Component Value Date   INR 1.25 04/02/2013   INR 2.12* 03/26/2013   No results found for this basename: PTT    Radiology: L foot Xray (01/20/14):   Changes consistent with progressive osteomyelitis of the great toe where an open soft tissue wound is identified.    Medical Decision Making  Rodney [name] is a 68 y.o. male who presents with: L foot gangrene with known osteomyelitis in L great toe   Pt's disease has spread past the point where a great toe amputation would be adequate.  I recommend at this point a L AKA.  This patient's cardiac status is difficult (high-risk), so I would try to limit his risk by avoiding a BKA that might not heal. I discussed in depth the nature of above-the-knee amputation with the patient, including risks, benefits, and alternatives.   The patient is aware that the risks of above-the-knee amputation include but are not limited to: bleeding, infection, myocardial infarction, stroke, death, failure to heal amputation wound, and possible need for more proximal amputation.   The patient is reluctant to proceed.  I will be back by tomorrow to discuss things with him once again.  He almost died previously with a dead R foot, so this patient is well aware of the risk of not proceeding with the  amputation.  Thank you for allowing [place] to participate in this patient's care.  Adele Barthel, MD [facility] Office: [phone] Pager: [phone]  01/24/2014, 9:28 AM

## 2014-01-25 LAB — CULTURE, BLOOD (ROUTINE X 2)
CULTURE: NO GROWTH
Culture: NO GROWTH

## 2014-01-25 MED ORDER — ZOLPIDEM TARTRATE 5 MG PO TABS: 5.0000 mg | ORAL_TABLET | Freq: Every evening | ORAL | Status: DC | PRN

## 2014-01-25 MED ORDER — PIPERACILLIN-TAZOBACTAM 3.375 G IVPB
3.3750 g | Freq: Three times a day (TID) | INTRAVENOUS | Status: DC
  Administered 2014-01-25 – 2014-01-27 (×5): 3.375 g via INTRAVENOUS
  Filled 2014-01-25 (×8): qty 50

## 2014-01-25 NOTE — Progress Notes (Signed)
   Daily Progress Note  Assessment/Planning: L foot gangrene, osteomyelitis   Pt still reluctant to proceed with L AKA  I reiterated again that a great toe amputation will not heal. I doubt a TMA will heal also.  His cardiac risks increase the risk of an anesthetic complication, so I would only put him through an AKA rather than risking an additional operation with a L BKA.  His other option is palliative management of his L foot gangrene  Let us know if this patient decides to proceed with L AKA  Subjective    "don't know"  Objective Filed Vitals:   01/24/14 0522 01/24/14 1420 01/24/14 2100 01/25/14 0610  BP: 106/54 117/49 116/60 123/68  Pulse: 83 85 61 73  Temp: 97.7 F (36.5 C) 97.9 F (36.6 C) 97.4 F (36.3 C) 98.1 F (36.7 C)  TempSrc:  Oral Axillary Oral  Resp: 18 18 18 14   Height:      Weight:      SpO2: 97% 100% 100% 99%    Intake/Output Summary (Last 24 hours) at 01/25/14 0844 Last data filed at 01/25/14 0500  Gross per 24 hour  Intake      0 ml  Output    700 ml  Net   -700 ml    VASC  L foot unchanged, continued gangrene, continued odor  Laboratory CBC    Component Value Date/Time   WBC 10.4 01/23/2014 0527   HGB 9.2* 01/23/2014 0527   HCT 29.0* 01/23/2014 0527   PLT 399 01/23/2014 0527    BMET    Component Value Date/Time   NA 133* 01/23/2014 0527   K 4.1 01/23/2014 0527   CL 97 01/23/2014 0527   CO2 21 01/23/2014 0527   GLUCOSE 97 01/23/2014 0527   BUN 19 01/23/2014 0527   CREATININE 1.04 01/23/2014 0527   CALCIUM 9.1 01/23/2014 0527   GFRNONAA 71* 01/23/2014 0527   GFRAA 83* 01/23/2014 [place], MD [facility]: [phone] Pager: [phone]  01/25/2014, 8:44 AM

## 2014-01-25 NOTE — Progress Notes (Signed)
Patient Demographics  Rodney [name], is a 68 y.o. male, DOB - 1945-12-05, EPP:[id number]  Admit date - 01/22/2014   Admitting Physician Thurnell Lose, MD  Outpatient Primary MD for the patient is [name], NP  LOS - 3   Chief Complaint  Patient presents with  . Foot Pain        Subjective:   [name] today has, No headache, No chest pain, No abdominal pain - No Nausea, No new weakness tingling or numbness, No Cough - SOB.    Assessment & Plan    1. 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 Currently no signs of sepsis, does not appear toxic, on empiric antibiotics which will be continued, seen by vascular surgeon Dr. [name], he is not a candidate for bypass surgery, likely left AKA if patient is agreeable, had a complex postop course during his recent right AKA where he nearly coded. We will have cardiology evaluate the patient preop.  He is still undecided about surgery, also refusing labs, will call palliative care to see if he is appropriate for palliative medical treatment. This was also noted by vascular surgery.    2. Ischemic cardiomyopathy with Chronic severe systolic heart failure with EF of 15% with diffuse wall motion abnormality and moderate AR on echogram one year ago - he currently appears compensated but will be a high-risk candidate for adverse cardiopulmonary outcome during his perioperative period, he is currently on aspirin, Coreg, fluid restriction, not on ACE/ARB will defer this to cardiology likely better to a start postop if needed. Strict intake and output monitor. Postop will likely send him to step down for closely monitoring his hemodynamics. Repeat echogram. Stable baseline EKG and chest x-ray. Seen by  cardiology his admission.    3. HTN (hypertension) - stable on Coreg monitor.    4. History of smoking. Counseled to quit.    5. History of dyslipidemia. Continue home dose statin.    6. CAD. Compensated from that standpoint, continue comminution of aspirin, beta blocker and statin for secondary prevention. Counseled to quit smoking.      Code Status: Full  Family Communication: none present  Disposition Plan: TBD   Procedures TTE   Consults  Vas Surg, Cards, Pall Care   Medications  Scheduled Meds: . aspirin EC  81 mg Oral Daily  . atorvastatin  40 mg Oral QPM  . carvedilol  6.25 mg Oral BID WC  . citalopram  20 mg Oral Daily  . heparin  5,000 Units Subcutaneous 3 times per day  . mirtazapine  7.5 mg Oral QHS  . piperacillin-tazobactam (ZOSYN)  IV  3.375 g Intravenous Q8H  . thiamine  200 mg Oral Daily  . vancomycin  500 mg Intravenous Q12H  . vitamin C  500 mg Oral Daily  . zinc sulfate  220 mg Oral Daily   Continuous Infusions:  PRN Meds:.acetaminophen, ondansetron (ZOFRAN) IV, ondansetron, zolpidem  DVT Prophylaxis   Heparin    Lab Results  Component Value Date   PLT 399 01/23/2014    Antibiotics     Anti-infectives   Start     Dose/Rate Route Frequency Ordered Stop   01/23/14  0200  vancomycin (VANCOCIN) 500 mg in sodium chloride 0.9 % 100 mL IVPB     500 mg 100 mL/hr over 60 Minutes Intravenous Every 12 hours 01/22/14 1710     01/22/14 1800  piperacillin-tazobactam (ZOSYN) IVPB 3.375 g     3.375 g 12.5 mL/hr over 240 Minutes Intravenous Every 8 hours 01/22/14 1630     01/22/14 1415  vancomycin (VANCOCIN) IVPB 1000 mg/200 mL premix     1,000 mg 200 mL/hr over 60 Minutes Intravenous  Once 01/22/14 1404 01/22/14 1644          Objective:   Filed Vitals:   01/24/14 0522 01/24/14 1420 01/24/14 2100 01/25/14 0610  BP: 106/54 117/49 116/60 123/68  Pulse: 83 85 61 73  Temp: 97.7 F (36.5 C) 97.9 F (36.6 C) 97.4 F (36.3 C) 98.1 F  (36.7 C)  TempSrc:  Oral Axillary Oral  Resp: 18 18 18 14   Height:      Weight:      SpO2: 97% 100% 100% 99%    Wt Readings from Last 3 Encounters:  01/22/14 47.628 kg (105 lb)  01/20/14 47.769 kg (105 lb 5 oz)  08/21/13 47.1 kg (103 lb 13.4 oz)     Intake/Output Summary (Last 24 hours) at 01/25/14 0951 Last data filed at 01/25/14 0500  Gross per 24 hour  Intake      0 ml  Output    700 ml  Net   -700 ml     Physical Exam  Awake Alert, Oriented X 3, No new F.N deficits, Normal affect [place].AT,PERRAL Supple Neck,No JVD, No cervical lymphadenopathy appriciated.  Symmetrical Chest wall movement, Good air movement bilaterally, CTAB RRR,No Gallops,Rubs, positive systolic Murmur, No Parasternal Heave +ve B.Sounds, Abd Soft, No tenderness, No organomegaly appriciated, No rebound - guarding or rigidity. No Cyanosis, Clubbing or edema, No new Rash or bruise , R AKA, L 1st is black and appears to have dry gangrene, left foot and leg also swollen, extremely poor pedal pulse on the left foot.   Data Review   Micro Results Recent Results (from the past 240 hour(s))  CULTURE, BLOOD (ROUTINE X 2)     Status: None   Collection Time    01/20/14  5:20 PM      Result Value Ref Range Status   Specimen Description BLOOD LEFT ANTECUBITAL   Final   Special Requests     Final   Value: BOTTLES DRAWN AEROBIC AND ANAEROBIC AEB=8CC ANA=4CC   Culture NO GROWTH 5 DAYS   Final   Report Status 01/25/2014 FINAL   Final  CULTURE, BLOOD (ROUTINE X 2)     Status: None   Collection Time    01/20/14  5:25 PM      Result Value Ref Range Status   Specimen Description BLOOD RIGHT ANTECUBITAL   Final   Special Requests     Final   Value: BOTTLES DRAWN AEROBIC AND ANAEROBIC AEB=8CC ANA=5CC   Culture NO GROWTH 5 DAYS   Final   Report Status 01/25/2014 FINAL   Final    Radiology Reports Dg Foot Complete Left  01/20/2014   CLINICAL DATA:  Left foot pain, infection. Necrosis of the great toe.  EXAM:  LEFT FOOT - COMPLETE 3+ VIEW  COMPARISON:  MRI of left foot 08/14/2013. Plain films of the left great toe 08/13/2013.  FINDINGS: Soft tissues of the foot and great toe are severely swollen. There is a wound projecting along the medial aspect of the  great toe at the level of the IP joint. There has been progressive bony destructive change in the distal phalanx and head of the proximal phalanx of the great toe. All bones are markedly osteopenic. No fracture is identified.  IMPRESSION: Changes consistent with progressive osteomyelitis of the great toe where an open soft tissue wound is identified.   Electronically Signed   By: Inge Rise M.D.   On: 01/20/2014 18:31     CBC  Recent Labs Lab 01/20/14 1456 01/22/14 1403 01/23/14 0527  WBC 12.7* 12.1* 10.4  HGB 9.8* 10.2* 9.2*  HCT 30.2* 31.9* 29.0*  PLT 419* 460* 399  MCV 83.9 83.9 83.3  MCH 27.2 26.8 26.4  MCHC 32.5 32.0 31.7  RDW 13.9 14.0 13.9  LYMPHSABS 1.9 2.0  --   MONOABS 1.0 0.9  --   EOSABS 0.4 0.5  --   BASOSABS 0.0 0.0  --     Chemistries   Recent Labs Lab 01/20/14 1456 01/22/14 1403 01/23/14 0527  NA 137 137 133*  K 3.9 4.3 4.1  CL 100 101 97  CO2 24 24 21   GLUCOSE 116* 106* 97  BUN 19 17 19   CREATININE 0.96 0.93 1.04  CALCIUM 9.4 9.5 9.1  AST 21  --  26  ALT 24  --  27  ALKPHOS 97  --  93  BILITOT 0.2*  --  0.2*   ------------------------------------------------------------------------------------------------------------------ estimated creatinine clearance is 45.1 ml/min (by C-G formula based on Cr of 1.04). ------------------------------------------------------------------------------------------------------------------ No results found for this basename: HGBA1C,  in the last 72 hours ------------------------------------------------------------------------------------------------------------------ No results found for this basename: CHOL, HDL, LDLCALC, TRIG, CHOLHDL, LDLDIRECT,  in the last 72  hours ------------------------------------------------------------------------------------------------------------------ No results found for this basename: TSH, T4TOTAL, FREET3, T3FREE, THYROIDAB,  in the last 72 hours ------------------------------------------------------------------------------------------------------------------ No results found for this basename: VITAMINB12, FOLATE, FERRITIN, TIBC, IRON, RETICCTPCT,  in the last 72 hours  Coagulation profile  Recent Labs Lab 01/24/14 1156  INR 1.28    No results found for this basename: DDIMER,  in the last 72 hours  Cardiac Enzymes No results found for this basename: CK, CKMB, TROPONINI, MYOGLOBIN,  in the last 168 hours ------------------------------------------------------------------------------------------------------------------ No components found with this basename: POCBNP,      Time Spent in minutes   35   Teria Khachatryan K M.D on 01/25/2014 at 9:51 AM  Between 7am to 7pm - Pager - [phone]  After 7pm go to [email or web address] - password [email or web address]  And look for the night coverage person covering for me after hours  [facility] Office  [phone]

## 2014-01-25 NOTE — Progress Notes (Signed)
Pt. refused  blood drawn for lab this AM.

## 2014-01-26 ENCOUNTER — Encounter (HOSPITAL_COMMUNITY): Payer: Self-pay | Admitting: Vascular Surgery

## 2014-01-26 DIAGNOSIS — M869 Osteomyelitis, unspecified: Secondary | ICD-10-CM

## 2014-01-26 NOTE — Progress Notes (Signed)
Patient has not signed consent for surgery.  Day shift left message for on-call regarding this.  Patient stated that he would agree to just the left great toe amputated but not the left foot.  I will try to maintain NPO tonight and hold Heparin just in case he changes his mind again in the morning.

## 2014-01-26 NOTE — Consult Note (Signed)
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 PMT consulted for goals of care discussion - he has refused amputation. Overall poor insight into his condition and low health literacy.  I attempted to speak with Mr. Caraveo this afternoon regarding his condition and to determine how our team could help him. He was very difficult to understand, he was drowsy and not interested in having a full discussion. I was able to get some informations from him as follows:  1. He does not want to make a decision on Code Status-difficult for him to fully understand what this means. 2. He does not want to designate an HCPOA- he "doesnt want anyone making decisions for him"- I provided education on this topic but Im not sure how much he was able to fully understand. 3. His foot and leg are clearly painful- he winces when his leg becomes dependent- he will only agree to have his toe removed-he does not want his leg amputated.  4. I offered hospice services to follow him at his rest home- he is going to think about his options.  Our team will follow up- difficult psycho-social issues and educational barriers.  Lane Hacker, DO Palliative Medicine [phone]

## 2014-01-26 NOTE — Progress Notes (Signed)
   Preoperative Note   Procedure: L AKA  Date: 01/27/14  Preoperative diagnosis: L foot gangrene  Consent: to be obtained  Laboratory:  CBC:    Component Value Date/Time   WBC 10.4 01/23/2014 0527   RBC 3.48* 01/23/2014 0527   HGB 9.2* 01/23/2014 0527   HCT 29.0* 01/23/2014 0527   PLT 399 01/23/2014 0527   MCV 83.3 01/23/2014 0527   MCH 26.4 01/23/2014 0527   MCHC 31.7 01/23/2014 0527   RDW 13.9 01/23/2014 0527   LYMPHSABS 2.0 01/22/2014 1403   MONOABS 0.9 01/22/2014 1403   EOSABS 0.5 01/22/2014 1403   BASOSABS 0.0 01/22/2014 1403    BMP:    Component Value Date/Time   NA 133* 01/23/2014 0527   K 4.1 01/23/2014 0527   CL 97 01/23/2014 0527   CO2 21 01/23/2014 0527   GLUCOSE 97 01/23/2014 0527   BUN 19 01/23/2014 0527   CREATININE 1.04 01/23/2014 0527   CALCIUM 9.1 01/23/2014 0527   GFRNONAA 71* 01/23/2014 0527   GFRAA 83* 01/23/2014 0527    Coagulation: Lab Results  Component Value Date   INR 1.28 01/24/2014   INR 1.25 04/02/2013   INR 2.12* 03/26/2013   No results found for this basename: PTT    Cardiology: see preop evaluation (01/24/14)  Echo (01/24/14):  - Left ventricle: The cavity size was normal. Systolic function was severely reduced. The estimated ejection fraction was in the range of 20% to 25%. Severe diffuse hypokinesis. Although no diagnostic regional wall motion abnormality was identified, this possibility cannot be completely excluded on the basis of this study. - Aortic valve: There was moderate regurgitation. Valve area (VTI): 1.38 cm^2. Valve area (Vmax): 1.54 cm^2. Valve area (Vmean): 1.49 cm^2. - Mitral valve: There was mild to moderate regurgitation. - Left atrium: The atrium was mildly dilated.  EKG: 01/24/14 NSR  CXR: 01/24/14 Cardiomegaly streaky atelectasis at the left lung base.  Adele Barthel, MD [facility] Office: [phone] Pager: [phone]  01/26/2014, 11:03  AM

## 2014-01-26 NOTE — Progress Notes (Signed)
Patient Demographics  Rodney [name], is a 68 y.o. male, DOB - 1945-04-23, WSF:[id number]  Admit date - 01/22/2014   Admitting Physician Thurnell Lose, MD  Outpatient Primary MD for the patient is [name], NP  LOS - 4   Chief Complaint  Patient presents with  . Foot Pain        Subjective:   [name] today has, No headache, No chest pain, No abdominal pain - No Nausea, No new weakness tingling or numbness, No Cough - SOB.    Assessment & Plan    1. 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 Currently no signs of sepsis, does not appear toxic, on empiric antibiotics which will be continued, seen by vascular surgeon Dr. [name], he is not a candidate for bypass surgery, likely left AKA if patient is agreeable, had a complex postop course during his recent right AKA where he nearly coded. We will have cardiology evaluate the patient preop.  Patient has now agreed for surgery. Vascular surgery informed. Likely left AKA on 01/27/2014    2. Ischemic cardiomyopathy with Chronic severe systolic heart failure with EF of 25% with diffuse wall motion abnormality and moderate AR  - he currently appears compensated but will be a high-risk candidate for adverse cardiopulmonary outcome during his perioperative period, he is currently on aspirin, Coreg, fluid restriction, not on ACE/ARB will defer this to cardiology likely better to a start postop if needed. Strict intake and output monitor. Postop will monitor closely, Stable baseline EKG and chest x-ray. Seen by cardiology his admission.    3. HTN (hypertension) - stable on Coreg monitor.    4. History of smoking. Counseled to quit.    5. History of dyslipidemia. Continue home dose  statin.    6. CAD. Compensated from that standpoint, continue comminution of aspirin, beta blocker and statin for secondary prevention. Counseled to quit smoking.      Code Status: Full  Family Communication: none present  Disposition Plan: TBD   Procedures TTE  - Left ventricle: The cavity size was normal. Systolic function was severely reduced. The estimated ejection fraction was in the range of 20% to 25%. Severe diffuse hypokinesis. Although no diagnostic regional wall motion abnormality was identified, this possibility cannot be completely excluded on the basis of this study. - Aortic valve: There was moderate regurgitation. Valve area (VTI): 1.38 cm^2. Valve area (Vmax): 1.54 cm^2. Valve area (Vmean): 1.49 cm^2. - Mitral valve: There was mild to moderate regurgitation. - Left atrium: The atrium was mildly dilated.    Consults  Vas Surg, Cards, Pall Care   Medications  Scheduled Meds: . aspirin EC  81 mg Oral Daily  . atorvastatin  40 mg Oral QPM  . carvedilol  6.25 mg Oral BID WC  . citalopram  20 mg Oral Daily  . heparin  5,000 Units Subcutaneous 3 times per day  . mirtazapine  7.5 mg Oral QHS  . piperacillin-tazobactam (ZOSYN)  IV  3.375 g Intravenous Q8H  . thiamine  200 mg Oral Daily  . vancomycin  500 mg Intravenous Q12H  . vitamin C  500 mg Oral Daily  . zinc sulfate  220  mg Oral Daily   Continuous Infusions:  PRN Meds:.acetaminophen, ondansetron (ZOFRAN) IV, ondansetron, zolpidem  DVT Prophylaxis   Heparin    Lab Results  Component Value Date   PLT 399 01/23/2014    Antibiotics     Anti-infectives   Start     Dose/Rate Route Frequency Ordered Stop   01/25/14 2000  piperacillin-tazobactam (ZOSYN) IVPB 3.375 g     3.375 g 12.5 mL/hr over 240 Minutes Intravenous Every 8 hours 01/25/14 1506     01/23/14 0200  vancomycin (VANCOCIN) 500 mg in sodium chloride 0.9 % 100 mL IVPB     500 mg 100 mL/hr over 60 Minutes Intravenous Every 12 hours  01/22/14 1710     01/22/14 1800  piperacillin-tazobactam (ZOSYN) IVPB 3.375 g  Status:  Discontinued     3.375 g 12.5 mL/hr over 240 Minutes Intravenous Every 8 hours 01/22/14 1630 01/25/14 1506   01/22/14 1415  vancomycin (VANCOCIN) IVPB 1000 mg/200 mL premix     1,000 mg 200 mL/hr over 60 Minutes Intravenous  Once 01/22/14 1404 01/22/14 1644          Objective:   Filed Vitals:   01/25/14 0610 01/25/14 1424 01/25/14 2021 01/26/14 0514  BP: 123/68 132/52 108/64 107/50  Pulse: 73 79 74 78  Temp: 98.1 F (36.7 C) 98.5 F (36.9 C) 97.5 F (36.4 C) 98.8 F (37.1 C)  TempSrc: Oral Oral Axillary Oral  Resp: 14 16 16 16   Height:      Weight:      SpO2: 99% 100% 96% 99%    Wt Readings from Last 3 Encounters:  01/22/14 47.628 kg (105 lb)  01/20/14 47.769 kg (105 lb 5 oz)  08/21/13 47.1 kg (103 lb 13.4 oz)     Intake/Output Summary (Last 24 hours) at 01/26/14 1011 Last data filed at 01/26/14 5009  Gross per 24 hour  Intake    650 ml  Output   1300 ml  Net   -650 ml     Physical Exam  Awake Alert, Oriented X 3, No new F.N deficits, Normal affect [place].AT,PERRAL Supple Neck,No JVD, No cervical lymphadenopathy appriciated.  Symmetrical Chest wall movement, Good air movement bilaterally, CTAB RRR,No Gallops,Rubs, positive systolic Murmur, No Parasternal Heave +ve B.Sounds, Abd Soft, No tenderness, No organomegaly appriciated, No rebound - guarding or rigidity. No Cyanosis, Clubbing or edema, No new Rash or bruise , R AKA, L 1st toe is black and appears to have dry gangrene, left foot and leg also swollen, extremely poor pedal pulse on the left foot.   Data Review   Micro Results Recent Results (from the past 240 hour(s))  CULTURE, BLOOD (ROUTINE X 2)     Status: None   Collection Time    01/20/14  5:20 PM      Result Value Ref Range Status   Specimen Description BLOOD LEFT ANTECUBITAL   Final   Special Requests     Final   Value: BOTTLES DRAWN AEROBIC AND ANAEROBIC  AEB=8CC ANA=4CC   Culture NO GROWTH 5 DAYS   Final   Report Status 01/25/2014 FINAL   Final  CULTURE, BLOOD (ROUTINE X 2)     Status: None   Collection Time    01/20/14  5:25 PM      Result Value Ref Range Status   Specimen Description BLOOD RIGHT ANTECUBITAL   Final   Special Requests     Final   Value: BOTTLES DRAWN AEROBIC AND ANAEROBIC AEB=8CC ANA=5CC  Culture NO GROWTH 5 DAYS   Final   Report Status 01/25/2014 FINAL   Final    Radiology Reports Dg Foot Complete Left  01/20/2014   CLINICAL DATA:  Left foot pain, infection. Necrosis of the great toe.  EXAM: LEFT FOOT - COMPLETE 3+ VIEW  COMPARISON:  MRI of left foot 08/14/2013. Plain films of the left great toe 08/13/2013.  FINDINGS: Soft tissues of the foot and great toe are severely swollen. There is a wound projecting along the medial aspect of the great toe at the level of the IP joint. There has been progressive bony destructive change in the distal phalanx and head of the proximal phalanx of the great toe. All bones are markedly osteopenic. No fracture is identified.  IMPRESSION: Changes consistent with progressive osteomyelitis of the great toe where an open soft tissue wound is identified.   Electronically Signed   By: Inge Rise M.D.   On: 01/20/2014 18:31     CBC  Recent Labs Lab 01/20/14 1456 01/22/14 1403 01/23/14 0527  WBC 12.7* 12.1* 10.4  HGB 9.8* 10.2* 9.2*  HCT 30.2* 31.9* 29.0*  PLT 419* 460* 399  MCV 83.9 83.9 83.3  MCH 27.2 26.8 26.4  MCHC 32.5 32.0 31.7  RDW 13.9 14.0 13.9  LYMPHSABS 1.9 2.0  --   MONOABS 1.0 0.9  --   EOSABS 0.4 0.5  --   BASOSABS 0.0 0.0  --     Chemistries   Recent Labs Lab 01/20/14 1456 01/22/14 1403 01/23/14 0527  NA 137 137 133*  K 3.9 4.3 4.1  CL 100 101 97  CO2 24 24 21   GLUCOSE 116* 106* 97  BUN 19 17 19   CREATININE 0.96 0.93 1.04  CALCIUM 9.4 9.5 9.1  AST 21  --  26  ALT 24  --  27  ALKPHOS 97  --  93  BILITOT 0.2*  --  0.2*    ------------------------------------------------------------------------------------------------------------------ estimated creatinine clearance is 45.1 ml/min (by C-G formula based on Cr of 1.04). ------------------------------------------------------------------------------------------------------------------ No results found for this basename: HGBA1C,  in the last 72 hours ------------------------------------------------------------------------------------------------------------------ No results found for this basename: CHOL, HDL, LDLCALC, TRIG, CHOLHDL, LDLDIRECT,  in the last 72 hours ------------------------------------------------------------------------------------------------------------------ No results found for this basename: TSH, T4TOTAL, FREET3, T3FREE, THYROIDAB,  in the last 72 hours ------------------------------------------------------------------------------------------------------------------ No results found for this basename: VITAMINB12, FOLATE, FERRITIN, TIBC, IRON, RETICCTPCT,  in the last 72 hours  Coagulation profile  Recent Labs Lab 01/24/14 1156  INR 1.28    No results found for this basename: DDIMER,  in the last 72 hours  Cardiac Enzymes No results found for this basename: CK, CKMB, TROPONINI, MYOGLOBIN,  in the last 168 hours ------------------------------------------------------------------------------------------------------------------ No components found with this basename: POCBNP,      Time Spent in minutes   35   Mosetta Ferdinand K M.D on 01/26/2014 at 10:11 AM  Between 7am to 7pm - Pager - [phone]  After 7pm go to [email or web address] - password [email or web address]  And look for the night coverage person covering for me after hours  [facility] Office  [phone]

## 2014-01-26 NOTE — Progress Notes (Signed)
  [facility] Progress Note  01/26/2014 12:38 PM  Went by room to discuss left AKA with patient. He expresses that his left big toe is "drying up" and is getting better. He does not want an amputation and states that he can take care of his leg at home. I explained that his wounds will not heal and will progressively worsen and he will ultimately require an amputation.   Will check again tomorrow regarding his decision with L AKA.    Virgina Jock, PA-C [facility] Office: [phone] Pager: [phone] 01/26/2014 12:38 PM

## 2014-01-26 NOTE — Progress Notes (Signed)
ANTIBIOTIC CONSULT NOTE - FOLLOW UP  Pharmacy Consult for Vancomycin and Zosyn Indication: osteomyelitis, left foot  No Known Allergies  Patient Measurements: Height: 5\' 3"  (160 cm) Weight: 105 lb (47.628 kg) IBW/kg (Calculated) : 56.9  Vital Signs: Temp: 98.6 F (37 C) (10/26 1353) Temp Source: Oral (10/26 0514) BP: 104/61 mmHg (10/26 1353) Pulse Rate: 85 (10/26 1353) Intake/Output from previous day: 10/25 0701 - 10/26 0700 In: 410 [P.O.:360; IV Piggyback:50] Out: 1300 [Urine:1300] Intake/Output from this shift: Total I/O In: 480 [P.O.:480] Out: 550 [Urine:550]  Labs:  - last labs 01/23/14: Scr 1.04,  WBC 10.4.  Assessment:  Day # 5 Vancomycin and Zosyn.   For possible left AKA on 01/27/14.  Goal of Therapy:  Vancomycin trough level 15-20 mcg/ml appropriate Zosyn dose for renal function and infection  Plan:   Continue Vancomycin 500 mg IV q12hrs.  Continue Zosyn 3.375 grams IV q8hrs (each over 4 hours).  For bmet and CBC in am.  Will follow up for +/- surgery on 01/27/14.  Will plan to check vancomycin trough level this week, if Vanc to continue.  Arty Baumgartner, [place] Pager: [phone] 01/26/2014,3:25 PM

## 2014-01-27 ENCOUNTER — Encounter (HOSPITAL_COMMUNITY)
Admission: EM | Disposition: A | Discharge status: Home / Self Care | Payer: Self-pay | Source: Home / Self Care | Attending: Internal Medicine

## 2014-01-27 LAB — BASIC METABOLIC PANEL
Anion gap: 18 — ABNORMAL HIGH (ref 5–15)
BUN: 16 mg/dL (ref 6–23)
CHLORIDE: 99 meq/L (ref 96–112)
CO2: 19 mEq/L (ref 19–32)
Calcium: 9.8 mg/dL (ref 8.4–10.5)
Creatinine, Ser: 0.99 mg/dL (ref 0.50–1.35)
GFR, EST NON AFRICAN AMERICAN: 82 mL/min — AB (ref >90)
Glucose, Bld: 84 mg/dL (ref 70–99)
POTASSIUM: 4.2 meq/L (ref 3.7–5.3)
SODIUM: 136 meq/L — AB (ref 137–147)

## 2014-01-27 LAB — CBC
HCT: 30.1 % — ABNORMAL LOW (ref 39.0–52.0)
Hemoglobin: 9.5 g/dL — ABNORMAL LOW (ref 13.0–17.0)
MCH: 26.5 pg (ref 26.0–34.0)
MCHC: 31.6 g/dL (ref 30.0–36.0)
MCV: 84.1 fL (ref 78.0–100.0)
PLATELETS: 441 10*3/uL — AB (ref 150–400)
RBC: 3.58 MIL/uL — AB (ref 4.22–5.81)
RDW: 13.9 % (ref 11.5–15.5)
WBC: 9.9 10*3/uL (ref 4.0–10.5)

## 2014-01-27 SURGERY — AMPUTATION, ABOVE KNEE
Anesthesia: General | Laterality: Left

## 2014-01-27 MED ORDER — DOXYCYCLINE HYCLATE 100 MG PO CAPS
100.0000 mg | ORAL_CAPSULE | Freq: Two times a day (BID) | ORAL | Status: DC
Start: 2014-01-27 — End: 2014-02-09

## 2014-01-27 MED ORDER — HYDROCODONE-ACETAMINOPHEN 5-325 MG PO TABS: 1.0000 | ORAL_TABLET | Freq: Four times a day (QID) | ORAL | Status: DC | PRN

## 2014-01-27 NOTE — Progress Notes (Signed)
[name] to be D/C'd Home per MD order. Discussed with the patient and all questions fully answered.    Medication List    STOP taking these medications       levofloxacin 500 MG tablet  Commonly known as:  LEVAQUIN      TAKE these medications       acetaminophen 325 MG tablet  Commonly known as:  TYLENOL  Take 2 tablets (650 mg total) by mouth every 6 (six) hours as needed for mild pain.     aspirin EC 81 MG tablet  Take 81 mg by mouth daily.     atorvastatin 40 MG tablet  Commonly known as:  LIPITOR  Take 40 mg by mouth every evening.     carvedilol 6.25 MG tablet  Commonly known as:  COREG  Take 1 tablet (6.25 mg total) by mouth 2 (two) times daily with a meal.     doxycycline 100 MG capsule  Commonly known as:  VIBRAMYCIN  Take 1 capsule (100 mg total) by mouth 2 (two) times daily.     escitalopram 5 MG tablet  Commonly known as:  LEXAPRO  Take 5 mg by mouth daily.     furosemide 40 MG tablet  Commonly known as:  LASIX  Take 40 mg by mouth daily as needed for fluid.     HYDROcodone-acetaminophen 5-325 MG per tablet  Commonly known as:  NORCO/VICODIN  Take 1 tablet by mouth every 6 (six) hours as needed for moderate pain.     mirtazapine 15 MG tablet  Commonly known as:  REMERON  Take 7.5 mg by mouth at bedtime.     mupirocin ointment 2 %  Commonly known as:  BACTROBAN  Apply 1 application topically 2 (two) times daily.     thiamine 100 MG tablet  Commonly known as:  VITAMIN B-1  Take 200 mg by mouth daily.     vitamin C 500 MG tablet  Commonly known as:  ASCORBIC ACID  Take 500 mg by mouth daily.     zinc sulfate 220 MG capsule  Take 220 mg by mouth daily.        VVS, Skin clean, dry and intact without evidence of skin break down, no evidence of skin tears noted.  IV catheter discontinued intact. Site without signs and symptoms of complications. Dressing and pressure applied.  An After Visit Summary was printed and given to the patient.   Patient escorted via [place], and D/C home via rest home transportation.  Cyndra Numbers  01/27/2014 2:22 PM  \

## 2014-01-27 NOTE — Progress Notes (Addendum)
   Daily Progress Note  Pt refused to sign consent and reported already ate breakfast.  There are obvious issues with this patient's decision making capacity.  I had raised these concerns during his prior admission.  Essentially his entire admission has been a waste due to his inability to understand his medical issues.  - Available as needed.  Adele Barthel, MD [facility] Office: [phone] Pager: [phone]  01/27/2014, 7:51 AM

## 2014-01-27 NOTE — Discharge Instructions (Signed)
Follow with Primary MD [name], NP in 3 days   Get CBC, CMP, 2 view Chest X ray checked  by Primary MD next visit.    Activity: As tolerated with Full fall precautions use walker/cane & assistance as needed   Disposition Home     Diet: Heart Healthy    For Heart failure patients - Check your Weight same time everyday, if you gain over 2 pounds, or you develop in leg swelling, experience more shortness of breath or chest pain, call your Primary MD immediately. Follow Cardiac Low Salt Diet and 1.8 lit/day fluid restriction.   On your next visit with your primary care physician please Get Medicines reviewed and adjusted.   Please request your Prim.MD to go over all Hospital Tests and Procedure/Radiological results at the follow up, please get all Hospital records sent to your Prim MD by signing hospital release before you go home.   If you experience worsening of your admission symptoms, develop shortness of breath, life threatening emergency, suicidal or homicidal thoughts you must seek medical attention immediately by calling 911 or calling your MD immediately  if symptoms less severe.  You Must read complete instructions/literature along with all the possible adverse reactions/side effects for all the Medicines you take and that have been prescribed to you. Take any new Medicines after you have completely understood and accpet all the possible adverse reactions/side effects.   Do not drive, operating heavy machinery, perform activities at heights, swimming or participation in water activities or provide baby sitting services if your were admitted for syncope or siezures until you have seen by Primary MD or a Neurologist and advised to do so again.  Do not drive when taking Pain medications.    Do not take more than prescribed Pain, Sleep and Anxiety Medications  Special Instructions: If you have smoked or chewed Tobacco  in the last 2 yrs please stop smoking, stop any regular  Alcohol  and or any Recreational drug use.  Wear Seat belts while driving.   Please note  You were cared for by a hospitalist during your hospital stay. If you have any questions about your discharge medications or the care you received while you were in the hospital after you are discharged, you can call the unit and asked to speak with the hospitalist on call if the hospitalist that took care of you is not available. Once you are discharged, your primary care physician will handle any further medical issues. Please note that NO REFILLS for any discharge medications will be authorized once you are discharged, as it is imperative that you return to your primary care physician (or establish a relationship with a primary care physician if you do not have one) for your aftercare needs so that they can reassess your need for medications and monitor your lab values.

## 2014-01-27 NOTE — Progress Notes (Signed)
I changed patient's diet to heart healthy since he is refusing surgery and he will need a breakfast tray in the morning.

## 2014-01-27 NOTE — Discharge Summary (Signed)
Rodney [name], is a 68 y.o. male  DOB [birth year]  MRN [id number].  Admission date:  01/22/2014  Admitting Physician  Thurnell Lose, MD  Discharge Date:  01/27/2014   Primary MD  [name], NP  Recommendations for primary care physician for things to follow:   Monitor clinically, if he agrees for surgery he requires left AKA. Not a candidate for bypass surgery. Can be done in [place] by general surgeon per Dr. [name] vascular surgeon here.   Admission Diagnosis  Osteomyelitis of left foot [M86.9] Osteomyelitis of foot [M86.9] Essential hypertension [I10]   Discharge Diagnosis  Osteomyelitis of left foot [M86.9] Osteomyelitis of foot [M86.9] Essential hypertension [I10]    Active Problems:   HTN (hypertension)   Chronic systolic CHF (congestive heart failure)   Noncompliance   Osteomyelitis of left foot   Cellulitis of foot      Past Medical History  Diagnosis Date  . HTN (hypertension)   . Smoking hx 03/25/2013  . Alcohol abuse 03/25/2013  . CHF (congestive heart failure) 03/25/2013    Past Surgical History  Procedure Laterality Date  . Above knee leg amputation Right   . Amputation Right 03/26/2013    Procedure: AMPUTATION ABOVE KNEE;  Surgeon: Rosetta Posner, MD;  Location: [facility];  Service: Vascular;  Laterality: Right;       History of present illness and  Hospital Course:     Kindly see H&P for history of present illness and admission details, please review complete Labs, Consult reports and Test reports for all details in brief  HPI  from the history and physical done on the day of admission  Rodney [name] is a 68 y.o. 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  The pain is especially on the left great toe. The patient apparently was seen in the emergency room 2 days ago for the same problem but they've gotten worse. Evaluation in the emergency room shows an open wound on the distal dorsal surface medially of the left foot including the whole left great toe which looks necrotic. He is now being admitted for further management.   Hospital Course     1. 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 Currently no signs of sepsis, does not appear toxic, on empiric antibiotics which will be continued, seen by vascular surgeon Dr. [name], he is not a candidate for bypass surgery, vascular surgery recommended left AKA which patient agreed and then declined 3 times, he was counseled that if he does not get left AKA infection can overwhelm him and cause sepsis/death and all disability. He was also seen by palliative care. He has finally decided not to undergo surgical resection/left AKA. He assumes all responsibility of this decision. We'll discharge him on 10 more days of oral antibiotic with follow-up with PCP in [place].  Per Dr. [name] he does not qualify for a bypass surgery but requires left AKA which can be done by general surgeon in any hospital.     2. Ischemic cardiomyopathy with Chronic severe systolic heart failure with EF of 25% with diffuse wall motion abnormality and moderate AR - he currently appears compensated but will be a high-risk candidate for adverse cardiopulmonary outcome during his perioperative period if he agrees for surgery next admission, continue aspirin, Coreg, ACE/ARB and home dose diuretic. Again counseled on smoking.    3. HTN (hypertension) - stable on Coreg monitor.    4. History of smoking. Counseled to quit.    5. History of dyslipidemia. Continue home dose statin.    6. CAD. Compensated from that standpoint, continue comminution of aspirin, beta blocker and statin for  secondary prevention. Counseled to quit smoking.     Discharge Condition: stable, prognosis highly guarded. Has refused surgery again 3, resumes responsibility for any adverse outcome including sepsis, death and disability.    Follow UP  Follow-up Information   Follow up with [name], NP. Schedule an appointment as soon as possible for a visit in 3 days.   Specialty:  Nurse Practitioner   Contact information:   [address]  [address] [phone]       Follow up with Aviva Signs A, MD. Schedule an appointment as soon as possible for a visit in 1 week.   Specialty:  General Surgery   Contact information:   [address] [address] [phone]         Discharge Instructions  and  Discharge Medications      Discharge Instructions   Discharge instructions    Complete by:  As directed   Follow with Primary MD [name], NP in 3 days   Get CBC, CMP, 2 view Chest X ray checked  by Primary MD next visit.    Activity: As tolerated with Full fall precautions use walker/cane & assistance as needed   Disposition Home     Diet: Heart Healthy    For Heart failure patients - Check your Weight same time everyday, if you gain over 2 pounds, or you develop in leg swelling, experience more shortness of breath or chest pain, call your Primary MD immediately. Follow Cardiac Low Salt Diet and 1.8 lit/day fluid restriction.   On your next visit with your primary care physician please Get Medicines reviewed and adjusted.   Please request your Prim.MD to go over all Hospital Tests and Procedure/Radiological results at the follow up, please get all Hospital records sent to your Prim MD by signing hospital release before you go home.   If you experience worsening of your admission symptoms, develop shortness of breath, life threatening emergency, suicidal or homicidal thoughts you must seek medical attention immediately by calling 911 or  calling your MD immediately  if symptoms less severe.  You Must read complete instructions/literature along with all the possible adverse reactions/side effects for all the Medicines you take and that have been prescribed to you. Take any new Medicines after you have completely understood and accpet all the possible adverse reactions/side effects.   Do not drive, operating heavy machinery, perform activities at heights, swimming or participation in water activities or provide baby sitting services if your were admitted for syncope or siezures until you have seen by Primary MD or a Neurologist and advised to do so again.  Do not drive when taking Pain medications.    Do  not take more than prescribed Pain, Sleep and Anxiety Medications  Special Instructions: If you have smoked or chewed Tobacco  in the last 2 yrs please stop smoking, stop any regular Alcohol  and or any Recreational drug use.  Wear Seat belts while driving.   Please note  You were cared for by a hospitalist during your hospital stay. If you have any questions about your discharge medications or the care you received while you were in the hospital after you are discharged, you can call the unit and asked to speak with the hospitalist on call if the hospitalist that took care of you is not available. Once you are discharged, your primary care physician will handle any further medical issues. Please note that NO REFILLS for any discharge medications will be authorized once you are discharged, as it is imperative that you return to your primary care physician (or establish a relationship with a primary care physician if you do not have one) for your aftercare needs so that they can reassess your need for medications and monitor your lab values.     Increase activity slowly    Complete by:  As directed             Medication List    STOP taking these medications       levofloxacin 500 MG tablet  Commonly known as:  LEVAQUIN        TAKE these medications       acetaminophen 325 MG tablet  Commonly known as:  TYLENOL  Take 2 tablets (650 mg total) by mouth every 6 (six) hours as needed for mild pain.     aspirin EC 81 MG tablet  Take 81 mg by mouth daily.     atorvastatin 40 MG tablet  Commonly known as:  LIPITOR  Take 40 mg by mouth every evening.     carvedilol 6.25 MG tablet  Commonly known as:  COREG  Take 1 tablet (6.25 mg total) by mouth 2 (two) times daily with a meal.     doxycycline 100 MG capsule  Commonly known as:  VIBRAMYCIN  Take 1 capsule (100 mg total) by mouth 2 (two) times daily.     escitalopram 5 MG tablet  Commonly known as:  LEXAPRO  Take 5 mg by mouth daily.     furosemide 40 MG tablet  Commonly known as:  LASIX  Take 40 mg by mouth daily as needed for fluid.     HYDROcodone-acetaminophen 5-325 MG per tablet  Commonly known as:  NORCO/VICODIN  Take 1 tablet by mouth every 6 (six) hours as needed for moderate pain.     mirtazapine 15 MG tablet  Commonly known as:  REMERON  Take 7.5 mg by mouth at bedtime.     mupirocin ointment 2 %  Commonly known as:  BACTROBAN  Apply 1 application topically 2 (two) times daily.     thiamine 100 MG tablet  Commonly known as:  VITAMIN B-1  Take 200 mg by mouth daily.     vitamin C 500 MG tablet  Commonly known as:  ASCORBIC ACID  Take 500 mg by mouth daily.     zinc sulfate 220 MG capsule  Take 220 mg by mouth daily.          Diet and Activity recommendation: See Discharge Instructions above   Consults obtained - Vas Surgery, [facility], cardiology   Major procedures and Radiology Reports - PLEASE review detailed and final reports  for all details, in brief -     TTE  - Left ventricle: The cavity size was normal. Systolic function was severely reduced. The estimated ejection fraction was in the range of 20% to 25%. Severe diffuse hypokinesis. Although no diagnostic regional wall motion abnormality was identified,  this possibility cannot be completely excluded on the basis of this study. - Aortic valve: There was moderate regurgitation. Valve area (VTI): 1.38 cm^2. Valve area (Vmax): 1.54 cm^2. Valve area (Vmean): 1.49 cm^2. - Mitral valve: There was mild to moderate regurgitation. - Left atrium: The atrium was mildly dilated.    Dg Chest 2 View  01/24/2014   CLINICAL DATA:  COPD  EXAM: CHEST  2 VIEW  COMPARISON:  03/29/2013 and 03/27/2013  FINDINGS: Cardiomegaly appears stable. Mediastinal and hilar contours are normal and stable. Pulmonary vascularity is normal. Normal lung volumes. Streaky atelectasis is seen posteriorly at the left lung base. No airspace disease appreciated. Negative for pneumothorax. No evidence of pleural effusion, but the posterior costophrenic angles are partially excluded from the lateral view. No acute osseous abnormality.  IMPRESSION: Cardiomegaly streaky atelectasis at the left lung base.   Electronically Signed   By: Curlene Dolphin M.D.   On: 01/24/2014 17:11   Dg Foot Complete Left  01/20/2014   CLINICAL DATA:  Left foot pain, infection. Necrosis of the great toe.  EXAM: LEFT FOOT - COMPLETE 3+ VIEW  COMPARISON:  MRI of left foot 08/14/2013. Plain films of the left great toe 08/13/2013.  FINDINGS: Soft tissues of the foot and great toe are severely swollen. There is a wound projecting along the medial aspect of the great toe at the level of the IP joint. There has been progressive bony destructive change in the distal phalanx and head of the proximal phalanx of the great toe. All bones are markedly osteopenic. No fracture is identified.  IMPRESSION: Changes consistent with progressive osteomyelitis of the great toe where an open soft tissue wound is identified.   Electronically Signed   By: Inge Rise M.D.   On: 01/20/2014 18:31    Micro Results      Recent Results (from the past 240 hour(s))  CULTURE, BLOOD (ROUTINE X 2)     Status: None   Collection Time     01/20/14  5:20 PM      Result Value Ref Range Status   Specimen Description BLOOD LEFT ANTECUBITAL   Final   Special Requests     Final   Value: BOTTLES DRAWN AEROBIC AND ANAEROBIC AEB=8CC ANA=4CC   Culture NO GROWTH 5 DAYS   Final   Report Status 01/25/2014 FINAL   Final  CULTURE, BLOOD (ROUTINE X 2)     Status: None   Collection Time    01/20/14  5:25 PM      Result Value Ref Range Status   Specimen Description BLOOD RIGHT ANTECUBITAL   Final   Special Requests     Final   Value: BOTTLES DRAWN AEROBIC AND ANAEROBIC AEB=8CC ANA=5CC   Culture NO GROWTH 5 DAYS   Final   Report Status 01/25/2014 FINAL   Final       Today   Subjective:   Rodney [name] today has no headache,no chest abdominal pain,no new weakness tingling or numbness, feels much better wants to go home today, has refused surgery again 3, resumes responsibility for any adverse outcome including sepsis, death and disability.  Objective:   Blood pressure 117/60, pulse 82, temperature 97.7 F (36.5 C),  temperature source Oral, resp. rate 18, height 5\' 3"  (1.6 m), weight 47.628 kg (105 lb), SpO2 100.00%.   Intake/Output Summary (Last 24 hours) at 01/27/14 0816 Last data filed at 01/26/14 1702  Gross per 24 hour  Intake    630 ml  Output    750 ml  Net   -120 ml    Exam Awake Alert, Oriented x 3, No new F.N deficits, Normal affect [place].AT,PERRAL Supple Neck,No JVD, No cervical lymphadenopathy appriciated.  Symmetrical Chest wall movement, Good air movement bilaterally, CTAB RRR,No Gallops,Rubs or new Murmurs, No Parasternal Heave +ve B.Sounds, Abd Soft, Non tender, No organomegaly appriciated, No rebound -guarding or rigidity. No Cyanosis, Clubbing or edema, No new Rash or bruise, LR AKA, L first toe has dry gangrene and appears dead, some erythema and swelling in the left foot.  Data Review   CBC w Diff: Lab Results  Component Value Date   WBC 9.9 01/27/2014   HGB 9.5* 01/27/2014   HCT 30.1* 01/27/2014    PLT 441* 01/27/2014   LYMPHOPCT 17 01/22/2014   MONOPCT 8 01/22/2014   EOSPCT 4 01/22/2014   BASOPCT 0 01/22/2014    CMP: Lab Results  Component Value Date   NA 133* 01/23/2014   K 4.1 01/23/2014   CL 97 01/23/2014   CO2 21 01/23/2014   BUN 19 01/23/2014   CREATININE 1.04 01/23/2014   PROT 8.1 01/23/2014   ALBUMIN 2.9* 01/23/2014   BILITOT 0.2* 01/23/2014   ALKPHOS 93 01/23/2014   AST 26 01/23/2014   ALT 27 01/23/2014  .   Total Time in preparing paper work, data evaluation and todays exam - 35 minutes  Thurnell Lose M.D on 01/27/2014 at [place]  [phone]

## 2014-01-27 NOTE — Clinical Social Work Note (Addendum)
10:48am- CSW received a call from Helene Kelp, owner of [facility] who states they will provide transportation for pt back to rest home between 1-2pm today.  RN made aware and is agreeable. DC summary and scripts are the only two things requested from the facility.  No FL2 needed per  Helene Kelp.  Packet on chart.  9:47am- Patient is from [facility] [address] in [place].  [phone]  Owner of family care home is Maia Breslow who can be reached at [phone].  CSW left Helene Kelp a message regarding patient's possible return since refusing surgery.  CSW awaiting a return call.  [facility] will provide transportation once patient has been medically discharged.  Nonnie Done, [place] [phone]  Psychiatric & Orthopedics (5N 1-16) Clinical Social Worker

## 2014-01-27 NOTE — Clinical Social Work Psychosocial (Signed)
Clinical Social Work Department BRIEF PSYCHOSOCIAL ASSESSMENT 01/27/2014  Patient:  Rodney [name], Rodney [name]     Account Number:  [id number]     Admit date:  01/22/2014  Clinical Social Worker:  Wylene Men  Date/Time:  01/27/2014 10:58 AM  Referred by:  Physician  Date Referred:  01/27/2014 Referred for  Psychosocial assessment   Other Referral:   Pt is from [facility]  [phone]   Interview type:  Patient Other interview type:   none    PSYCHOSOCIAL DATA Living Status:  FACILITY Admitted from facility:  Other Level of care:   Primary support name:  mary Primary support relationship to patient:  FAMILY Degree of support available:   fair    CURRENT CONCERNS Current Concerns  Post-Acute Placement   Other Concerns:   none  Pt not agreeable to have surgery to amputate toe and possible medical complications of such decision.  All of which has been discussed by MD and medical staff and documented appropriately throughout the chart.    SOCIAL WORK ASSESSMENT / PLAN CSW spoke with pt.  Pt states he is from [facility] and wil return once he is medicallly discharged. Pt was admitted to [facility] for possible toe amputation.  Pt states that he does not need this procedure done and that his toe is "looking better" regardless of medical staff and MD recommendations.  Pt has been medically discharged and per MD can discharge back to the family care home.    Pt is agreeable to this discharge.  CSW has contacted Maia Breslow, owner of the family care home, who states that the facility will transport pt back to the facility. Transportation has been scheduled for today between 1-2pm.   Assessment/plan status:  Psychosocial Support/Ongoing Assessment of Needs Other assessment/ plan:   none   Information/referral to community resources:   [facility]    PATIENT'S/FAMILY'S RESPONSE TO PLAN OF CARE: pt is agreeable to return to [facility].       Nonnie Done, [place] [phone]  Psychiatric & Orthopedics (5N 1-16) Clinical Social Worker

## 2014-01-27 NOTE — Progress Notes (Addendum)
I tried to keep patient NPO however he just requested coffee with cream and sugar.  I reminded him to not eat or drink just in case he changed his mind regarding surgery.  He stated again that he will not be having surgery.  I provided the coffee.

## 2014-02-04 ENCOUNTER — Inpatient Hospital Stay (HOSPITAL_COMMUNITY)
Admission: EM | Admit: 2014-02-04 | Discharge: 2014-02-09 | DRG: 240 | Disposition: A | Discharge status: Skilled Nursing Facility | Payer: Medicare Other | Attending: Internal Medicine | Admitting: Internal Medicine

## 2014-02-04 ENCOUNTER — Encounter (HOSPITAL_COMMUNITY): Payer: Self-pay | Admitting: *Deleted

## 2014-02-04 ENCOUNTER — Inpatient Hospital Stay (HOSPITAL_COMMUNITY): Payer: Medicare Other

## 2014-02-04 DIAGNOSIS — I70262 Atherosclerosis of native arteries of extremities with gangrene, left leg: Principal | ICD-10-CM | POA: Diagnosis present

## 2014-02-04 DIAGNOSIS — I7092 Chronic total occlusion of artery of the extremities: Secondary | ICD-10-CM | POA: Diagnosis present

## 2014-02-04 DIAGNOSIS — I10 Essential (primary) hypertension: Secondary | ICD-10-CM | POA: Diagnosis present

## 2014-02-04 DIAGNOSIS — Z7982 Long term (current) use of aspirin: Secondary | ICD-10-CM

## 2014-02-04 DIAGNOSIS — F1290 Cannabis use, unspecified, uncomplicated: Secondary | ICD-10-CM | POA: Diagnosis present

## 2014-02-04 DIAGNOSIS — Z23 Encounter for immunization: Secondary | ICD-10-CM

## 2014-02-04 DIAGNOSIS — L97429 Non-pressure chronic ulcer of left heel and midfoot with unspecified severity: Secondary | ICD-10-CM | POA: Diagnosis present

## 2014-02-04 DIAGNOSIS — M869 Osteomyelitis, unspecified: Secondary | ICD-10-CM | POA: Diagnosis present

## 2014-02-04 DIAGNOSIS — D649 Anemia, unspecified: Secondary | ICD-10-CM | POA: Diagnosis present

## 2014-02-04 DIAGNOSIS — B999 Unspecified infectious disease: Secondary | ICD-10-CM

## 2014-02-04 DIAGNOSIS — I255 Ischemic cardiomyopathy: Secondary | ICD-10-CM | POA: Diagnosis present

## 2014-02-04 DIAGNOSIS — F17210 Nicotine dependence, cigarettes, uncomplicated: Secondary | ICD-10-CM | POA: Diagnosis present

## 2014-02-04 DIAGNOSIS — M79672 Pain in left foot: Secondary | ICD-10-CM | POA: Diagnosis present

## 2014-02-04 DIAGNOSIS — I083 Combined rheumatic disorders of mitral, aortic and tricuspid valves: Secondary | ICD-10-CM | POA: Diagnosis present

## 2014-02-04 DIAGNOSIS — F1010 Alcohol abuse, uncomplicated: Secondary | ICD-10-CM | POA: Diagnosis present

## 2014-02-04 DIAGNOSIS — I5022 Chronic systolic (congestive) heart failure: Secondary | ICD-10-CM | POA: Diagnosis present

## 2014-02-04 DIAGNOSIS — Z89611 Acquired absence of right leg above knee: Secondary | ICD-10-CM

## 2014-02-04 DIAGNOSIS — I2510 Atherosclerotic heart disease of native coronary artery without angina pectoris: Secondary | ICD-10-CM | POA: Diagnosis present

## 2014-02-04 DIAGNOSIS — Z87891 Personal history of nicotine dependence: Secondary | ICD-10-CM

## 2014-02-04 LAB — CBC WITH DIFFERENTIAL/PLATELET
Basophils Absolute: 0.1 10*3/uL (ref 0.0–0.1)
Basophils Relative: 1 % (ref 0–1)
EOS ABS: 0.3 10*3/uL (ref 0.0–0.7)
EOS PCT: 2 % (ref 0–5)
HEMATOCRIT: 30.9 % — AB (ref 39.0–52.0)
HEMOGLOBIN: 9.9 g/dL — AB (ref 13.0–17.0)
LYMPHS ABS: 2.4 10*3/uL (ref 0.7–4.0)
Lymphocytes Relative: 18 % (ref 12–46)
MCH: 26.3 pg (ref 26.0–34.0)
MCHC: 32 g/dL (ref 30.0–36.0)
MCV: 82 fL (ref 78.0–100.0)
MONO ABS: 1 10*3/uL (ref 0.1–1.0)
MONOS PCT: 8 % (ref 3–12)
Neutro Abs: 9.5 10*3/uL — ABNORMAL HIGH (ref 1.7–7.7)
Neutrophils Relative %: 71 % (ref 43–77)
Platelets: 412 10*3/uL — ABNORMAL HIGH (ref 150–400)
RBC: 3.77 MIL/uL — AB (ref 4.22–5.81)
RDW: 14.6 % (ref 11.5–15.5)
WBC: 13.3 10*3/uL — ABNORMAL HIGH (ref 4.0–10.5)

## 2014-02-04 LAB — ETHANOL: Alcohol, Ethyl (B): <11 mg/dL (ref 0–11)

## 2014-02-04 LAB — LACTIC ACID, PLASMA: LACTIC ACID, VENOUS: 0.6 mmol/L (ref 0.5–2.2)

## 2014-02-04 LAB — RAPID URINE DRUG SCREEN, HOSP PERFORMED
Amphetamines: NOT DETECTED
Barbiturates: NOT DETECTED
Benzodiazepines: NOT DETECTED
Cocaine: NOT DETECTED
OPIATES: NOT DETECTED
TETRAHYDROCANNABINOL: NOT DETECTED

## 2014-02-04 LAB — BASIC METABOLIC PANEL
Anion gap: 15 (ref 5–15)
BUN: 15 mg/dL (ref 6–23)
CO2: 22 mEq/L (ref 19–32)
CREATININE: 0.9 mg/dL (ref 0.50–1.35)
Calcium: 9.9 mg/dL (ref 8.4–10.5)
Chloride: 100 mEq/L (ref 96–112)
GFR calc Af Amer: >90 mL/min (ref >90)
GFR, EST NON AFRICAN AMERICAN: 85 mL/min — AB (ref >90)
GLUCOSE: 92 mg/dL (ref 70–99)
POTASSIUM: 4.1 meq/L (ref 3.7–5.3)
Sodium: 137 mEq/L (ref 137–147)

## 2014-02-04 LAB — C-REACTIVE PROTEIN: CRP: 14.3 mg/dL — ABNORMAL HIGH (ref <0.60)

## 2014-02-04 LAB — APTT: APTT: 49 s — AB (ref 24–37)

## 2014-02-04 LAB — AMMONIA: Ammonia: 21 umol/L (ref 11–60)

## 2014-02-04 LAB — SEDIMENTATION RATE: Sed Rate: 124 mm/hr — ABNORMAL HIGH (ref 0–16)

## 2014-02-04 MED ORDER — INFLUENZA VAC SPLIT QUAD 0.5 ML IM SUSY
0.5000 mL | PREFILLED_SYRINGE | INTRAMUSCULAR | Status: DC
  Filled 2014-02-04: qty 0.5

## 2014-02-04 MED ORDER — HYDROMORPHONE HCL 1 MG/ML IJ SOLN
1.0000 mg | Freq: Once | INTRAMUSCULAR | Status: AC
  Administered 2014-02-04: 1 mg via INTRAVENOUS
  Filled 2014-02-04: qty 1

## 2014-02-04 MED ORDER — ONDANSETRON HCL 4 MG/2ML IJ SOLN
4.0000 mg | Freq: Once | INTRAMUSCULAR | Status: AC
  Administered 2014-02-04: 4 mg via INTRAVENOUS
  Filled 2014-02-04: qty 2

## 2014-02-04 MED ORDER — ATORVASTATIN CALCIUM 40 MG PO TABS
40.0000 mg | ORAL_TABLET | Freq: Every evening | ORAL | Status: DC
  Administered 2014-02-04 – 2014-02-08 (×4): 40 mg via ORAL
  Filled 2014-02-04 (×6): qty 1

## 2014-02-04 MED ORDER — SODIUM CHLORIDE 0.9 % IV SOLN
INTRAVENOUS | Status: DC
  Administered 2014-02-04 – 2014-02-08 (×7): via INTRAVENOUS

## 2014-02-04 MED ORDER — MIRTAZAPINE 7.5 MG PO TABS
7.5000 mg | ORAL_TABLET | Freq: Every day | ORAL | Status: DC
  Administered 2014-02-04 – 2014-02-07 (×4): 7.5 mg via ORAL
  Filled 2014-02-04 (×8): qty 1

## 2014-02-04 MED ORDER — CARVEDILOL 6.25 MG PO TABS
6.2500 mg | ORAL_TABLET | Freq: Two times a day (BID) | ORAL | Status: DC
  Administered 2014-02-06 – 2014-02-09 (×6): 6.25 mg via ORAL
  Filled 2014-02-04 (×11): qty 1

## 2014-02-04 MED ORDER — VITAMIN B-1 100 MG PO TABS
200.0000 mg | ORAL_TABLET | Freq: Every day | ORAL | Status: DC
  Administered 2014-02-05 – 2014-02-09 (×5): 200 mg via ORAL
  Filled 2014-02-04 (×5): qty 2

## 2014-02-04 MED ORDER — ESCITALOPRAM OXALATE 5 MG PO TABS
5.0000 mg | ORAL_TABLET | Freq: Every day | ORAL | Status: DC
  Administered 2014-02-05 – 2014-02-09 (×5): 5 mg via ORAL
  Filled 2014-02-04 (×5): qty 1

## 2014-02-04 MED ORDER — ACETAMINOPHEN 325 MG PO TABS: 650.0000 mg | ORAL_TABLET | Freq: Four times a day (QID) | ORAL | Status: DC | PRN

## 2014-02-04 MED ORDER — PIPERACILLIN-TAZOBACTAM 3.375 G IVPB
3.3750 g | Freq: Three times a day (TID) | INTRAVENOUS | Status: DC
  Administered 2014-02-05 – 2014-02-06 (×5): 3.375 g via INTRAVENOUS
  Filled 2014-02-04 (×7): qty 50

## 2014-02-04 MED ORDER — ASPIRIN EC 81 MG PO TBEC
81.0000 mg | DELAYED_RELEASE_TABLET | Freq: Every morning | ORAL | Status: DC
  Administered 2014-02-05 – 2014-02-09 (×5): 81 mg via ORAL
  Filled 2014-02-04 (×5): qty 1

## 2014-02-04 MED ORDER — ZINC SULFATE 220 (50 ZN) MG PO CAPS
220.0000 mg | ORAL_CAPSULE | Freq: Every day | ORAL | Status: DC
  Administered 2014-02-05 – 2014-02-09 (×5): 220 mg via ORAL
  Filled 2014-02-04 (×5): qty 1

## 2014-02-04 MED ORDER — VANCOMYCIN HCL IN DEXTROSE 1-5 GM/200ML-% IV SOLN
1000.0000 mg | Freq: Once | INTRAVENOUS | Status: AC
  Administered 2014-02-04: 1000 mg via INTRAVENOUS
  Filled 2014-02-04: qty 200

## 2014-02-04 MED ORDER — PNEUMOCOCCAL VAC POLYVALENT 25 MCG/0.5ML IJ INJ
0.5000 mL | INJECTION | INTRAMUSCULAR | Status: DC
  Filled 2014-02-04: qty 0.5

## 2014-02-04 MED ORDER — HEPARIN SODIUM (PORCINE) 5000 UNIT/ML IJ SOLN
5000.0000 [IU] | Freq: Three times a day (TID) | INTRAMUSCULAR | Status: DC
Start: 2014-02-04 — End: 2014-02-09
  Administered 2014-02-04 – 2014-02-09 (×13): 5000 [IU] via SUBCUTANEOUS
  Filled 2014-02-04 (×17): qty 1

## 2014-02-04 MED ORDER — VITAMIN C 500 MG PO TABS
500.0000 mg | ORAL_TABLET | Freq: Every day | ORAL | Status: DC
  Administered 2014-02-05 – 2014-02-09 (×5): 500 mg via ORAL
  Filled 2014-02-04 (×5): qty 1

## 2014-02-04 MED ORDER — VITAMIN B-1 100 MG PO TABS: 200.0000 mg | ORAL_TABLET | Freq: Every day | ORAL | Status: DC

## 2014-02-04 MED ORDER — VANCOMYCIN HCL IN DEXTROSE 1-5 GM/200ML-% IV SOLN
1000.0000 mg | INTRAVENOUS | Status: DC
  Administered 2014-02-05: 1000 mg via INTRAVENOUS
  Filled 2014-02-04 (×2): qty 200

## 2014-02-04 MED ORDER — SODIUM CHLORIDE 0.9 % IJ SOLN: 3.0000 mL | Freq: Two times a day (BID) | INTRAMUSCULAR | Status: DC

## 2014-02-04 MED ORDER — ESCITALOPRAM OXALATE 5 MG PO TABS: 5.0000 mg | ORAL_TABLET | Freq: Every day | ORAL | Status: DC

## 2014-02-04 MED ORDER — PIPERACILLIN-TAZOBACTAM 3.375 G IVPB 30 MIN
3.3750 g | Freq: Once | INTRAVENOUS | Status: AC
  Administered 2014-02-04: 3.375 g via INTRAVENOUS
  Filled 2014-02-04: qty 50

## 2014-02-04 MED ORDER — MORPHINE SULFATE 2 MG/ML IJ SOLN
2.0000 mg | INTRAMUSCULAR | Status: DC | PRN
  Administered 2014-02-06 – 2014-02-07 (×2): 2 mg via INTRAVENOUS
  Filled 2014-02-04 (×2): qty 1

## 2014-02-04 NOTE — Progress Notes (Signed)
ANTIBIOTIC CONSULT NOTE - INITIAL  Pharmacy Consult for Vancomycin, Zosyn  Indication: Osteomyelitis   No Known Allergies  Patient Measurements:   Adjusted Body Weight: n/a   Vital Signs: Temp: 98.5 F (36.9 C) (11/04 1624) Temp Source: Oral (11/04 1624) BP: 127/94 mmHg (11/04 1824) Pulse Rate: 85 (11/04 1824) Intake/Output from previous day:   Intake/Output from this shift:    Labs: No results for input(s): WBC, HGB, PLT, LABCREA, CREATININE in the last 72 hours. Estimated Creatinine Clearance: 47.4 mL/min (by C-G formula based on Cr of 0.99). No results for input(s): VANCOTROUGH, VANCOPEAK, VANCORANDOM, GENTTROUGH, GENTPEAK, GENTRANDOM, TOBRATROUGH, TOBRAPEAK, TOBRARND, AMIKACINPEAK, AMIKACINTROU, AMIKACIN in the last 72 hours.   Microbiology: Recent Results (from the past 720 hour(s))  Culture, blood (routine x 2)     Status: None   Collection Time: 01/20/14  5:20 PM  Result Value Ref Range Status   Specimen Description BLOOD LEFT ANTECUBITAL  Final   Special Requests   Final    BOTTLES DRAWN AEROBIC AND ANAEROBIC AEB=8CC ANA=4CC   Culture NO GROWTH 5 DAYS  Final   Report Status 01/25/2014 FINAL  Final  Culture, blood (routine x 2)     Status: None   Collection Time: 01/20/14  5:25 PM  Result Value Ref Range Status   Specimen Description BLOOD RIGHT ANTECUBITAL  Final   Special Requests   Final    BOTTLES DRAWN AEROBIC AND ANAEROBIC AEB=8CC ANA=5CC   Culture NO GROWTH 5 DAYS  Final   Report Status 01/25/2014 FINAL  Final    Medical History: Past Medical History  Diagnosis Date  . HTN (hypertension)   . Smoking hx 03/25/2013  . Alcohol abuse 03/25/2013  . CHF (congestive heart failure) 03/25/2013    Medications:   (Not in a hospital admission) Assessment: 83 YOM who presented today requesting surgical management of left foot/great toe osteomyelitis and dry gangrene. Pharmacy consulted to start empiric antibiotics. WBC elevated at 13.3. Pt is afebrile.    11/4: Blood Cx x2>>  Goal of Therapy:  Vancomycin trough level 15-20 mcg/ml  Plan:  -Start Vancomycin 1 gm IV Q 24 and Zosyn 3.375 gm IV Q 8 hours  -Monitor CBC, renal fx, cultures and patient's clinical status  -F/u VT at [facility], PharmD.  Clinical Pharmacist Pager [phone]

## 2014-02-04 NOTE — ED Notes (Signed)
Dallie Dad (caregiver) [phone]  **call with updates**

## 2014-02-04 NOTE — ED Notes (Signed)
Pt reports having wound and infection to left big toe, pt unsure of how long it has been infected. Pt had foul smell and drainage from toe.

## 2014-02-04 NOTE — ED Provider Notes (Addendum)
CSN: [id number]     Arrival date & time 02/04/14  1218 History   First MD Initiated Contact with Patient 02/04/14 1630     Chief Complaint  Patient presents with  . Toe Injury     (Consider location/radiation/quality/duration/timing/severity/associated sxs/prior Treatment) HPI Comments: Rodney [name] presents today requesting surgical management of his left foot/great toe osteomyelitis and dry gangrene. He was admitted 10/20-10/27 at which time AKA was recommended. Pt declined during this admission. He was discharged on Levaquin x 10 days which he reports compliance with. He denies fever but notes that infection has worsened in this time.   The history is provided by the patient. No language interpreter was used.    Past Medical History  Diagnosis Date  . HTN (hypertension)   . Smoking hx 03/25/2013  . Alcohol abuse 03/25/2013  . CHF (congestive heart failure) 03/25/2013   Past Surgical History  Procedure Laterality Date  . Above knee leg amputation Right   . Amputation Right 03/26/2013    Procedure: AMPUTATION ABOVE KNEE;  Surgeon: Rosetta Posner, MD;  Location: [facility] OR;  Service: Vascular;  Laterality: Right;   Family History  Problem Relation Age of Onset  . Heart disease      No family history   History  Substance Use Topics  . Smoking status: Current Some Day Smoker    Types: Cigarettes  . Smokeless tobacco: Not on file  . Alcohol Use: No     Comment: everyday    Review of Systems  Constitutional: Negative for fever and chills.  Respiratory: Negative for shortness of breath.   Cardiovascular: Negative for chest pain.  Musculoskeletal: Positive for myalgias, arthralgias and gait problem.  Skin: Positive for color change.  All other systems reviewed and are negative.     Allergies  Review of patient's allergies indicates no known allergies.  Home Medications   Prior to Admission medications   Medication Sig Start Date End Date Taking? Authorizing Provider    acetaminophen (TYLENOL) 325 MG tablet Take 2 tablets (650 mg total) by mouth every 6 (six) hours as needed for mild pain. 08/22/13  Yes [name], MD  aspirin EC 81 MG tablet Take 81 mg by mouth every morning.    Yes Historical Provider, MD  atorvastatin (LIPITOR) 40 MG tablet Take 40 mg by mouth every evening. 04/07/13  Yes [name], MD  carvedilol (COREG) 6.25 MG tablet Take 1 tablet (6.25 mg total) by mouth 2 (two) times daily with a meal. 04/07/13  Yes [name], MD  escitalopram (LEXAPRO) 5 MG tablet Take 5 mg by mouth daily. 07/31/13  Yes Historical Provider, MD  furosemide (LASIX) 40 MG tablet Take 40 mg by mouth daily as needed for fluid. 04/07/13  Yes [name], MD  mirtazapine (REMERON) 15 MG tablet Take 7.5 mg by mouth at bedtime.   Yes Historical Provider, MD  thiamine (VITAMIN B-1) 100 MG tablet Take 200 mg by mouth daily.   Yes Historical Provider, MD  vitamin C (ASCORBIC ACID) 500 MG tablet Take 500 mg by mouth daily.   Yes Historical Provider, MD  zinc sulfate 220 MG capsule Take 220 mg by mouth daily.   Yes Historical Provider, MD  doxycycline (VIBRAMYCIN) 100 MG capsule Take 1 capsule (100 mg total) by mouth 2 (two) times daily. 01/27/14   Thurnell Lose, MD  HYDROcodone-acetaminophen (NORCO/VICODIN) 5-325 MG per tablet Take 1 tablet by mouth every 6 (six) hours as needed for moderate  pain. 01/27/14   Thurnell Lose, MD  levofloxacin (LEVAQUIN) 500 MG tablet Take 500 mg by mouth daily. 01/21/14   Historical Provider, MD  mupirocin ointment (BACTROBAN) 2 % Apply 1 application topically 2 (two) times daily.  01/21/14   Historical Provider, MD   BP 107/85 mmHg  Pulse 87  Temp(Src) 98.5 F (36.9 C) (Oral)  Resp 19  SpO2 100% Physical Exam  Constitutional: He is oriented to person, place, and time.  Alert nontoxic sitting at the edge of the stretcher. Mental status is clear no acute respiratory distress.  HENT:  Head: Normocephalic.  Eyes: EOM are  normal.  Pulmonary/Chest: Effort normal.  Musculoskeletal:  see diagrammatic documentation.  Neurological: He is alert and oriented to person, place, and time. Coordination normal.  Skin: Skin is warm and dry.     Psychiatric: He has a normal mood and affect.    ED Course  Procedures (including critical care time) Labs Review Labs Reviewed  CBC WITH DIFFERENTIAL - Abnormal; Notable for the following:    WBC 13.3 (*)    RBC 3.77 (*)    Hemoglobin 9.9 (*)    HCT 30.9 (*)    Platelets 412 (*)    Neutro Abs 9.5 (*)    All other components within normal limits  BASIC METABOLIC PANEL - Abnormal; Notable for the following:    GFR calc non Af Amer 85 (*)    All other components within normal limits  SEDIMENTATION RATE - Abnormal; Notable for the following:    Sed Rate 124 (*)    All other components within normal limits  C-REACTIVE PROTEIN - Abnormal; Notable for the following:    CRP 14.3 (*)    All other components within normal limits  APTT - Abnormal; Notable for the following:    aPTT 49 (*)    All other components within normal limits  CULTURE, BLOOD (ROUTINE X 2)  CULTURE, BLOOD (ROUTINE X 2)  LACTIC ACID, PLASMA  ETHANOL  URINE RAPID DRUG SCREEN (HOSP PERFORMED)  AMMONIA  COMPREHENSIVE METABOLIC PANEL  CBC WITH DIFFERENTIAL  TYPE AND SCREEN    Imaging Review Dg Foot Complete Left  02/04/2014   CLINICAL DATA:  Subsequent encounter for 3-4 week history of left foot and infection  EXAM: LEFT FOOT - COMPLETE 3+ VIEW  COMPARISON:  01/20/2014.  FINDINGS: Bones are diffusely demineralized. There is been further ostial lipase is involving the proximal phalanx of the great toe, consistent with progressing osteomyelitis. Overlying soft tissue gas is again noted compatible with associated soft tissue infection. Chronic posttraumatic deformity is seen in the distal tibia and fibula.  IMPRESSION: Progressive bony loss involving proximal phalanx of the great toe consistent with  ongoing bony infection.   Electronically Signed   By: Misty Stanley M.D.   On: 02/04/2014 19:36     EKG Interpretation None      MDM   Final diagnoses:  None    Pt here for surgical management of osteomyelitis and gangrene of left foot. Similar prior admission which he declined surgery. Worsening of infection. No fever or s/s of systemic illness. Vascular surgery consulted. Will admit to Hospitalist for IV abx with plan for surgical management in near future.    Amparo Bristol, MD 02/04/14 9449  Charlesetta Shanks, MD 02/09/14 1109  Charlesetta Shanks, MD 02/21/14 [phone]

## 2014-02-04 NOTE — H&P (Signed)
Hospitalist Admission History and Physical  Patient name: Rodney [name] Medical record number: [id number] Date of birth: 07-01-1945 Age: 68 y.o. Gender: male  Primary Care Provider: [name], NP  Chief Complaint: L foot osteomyelitis  History of Present Illness:This is a 68 y.o. year old male with significant past medical history of severe peripheral vascular disease s/p R AKA, chronic systolic heart failure, HTN, hx/o ETOH abuse presenting with L foot osteomyelitis. Pt noted to have been recently admitted for R foot osteomyelitis 10/22-10/27. Pt refused multiple times L  AKA for treatment, which was recommendation by vascular surgery. Pt expressed understanding of consequences of surgical treatment and was discharged on extended course of oral abx (levaquin and doxy). Pt states that he has been compliant with regimen. Still with worsening redness, pain, discomfort per pt. States that he is now ready for amputation. " Can y'all do it tonight?"  Vital signs stable. No labs, IV abx started,  or imaging obtained prior to being asked for admission. Per EDP. Spoke with on call vascular surgery, who will preop pt.   Assessment and Plan: Rodney [name] is a 68 y.o. year old male presenting with L foot osteomyelitis   Active Problems:   Foot osteomyelitis, left   1- L foot osteomyelitis -vanc and zosyn  -f/u vascular surgery consult  -likely AKA tomorrow vs. 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 Foot osteomyelitis, left 02/04/2014  . Osteomyelitis of left foot 01/22/2014  . Cellulitis of foot 01/22/2014  . Noncompliance 08/22/2013  . Preop cardiovascular exam 08/19/2013  . Left foot pain 08/14/2013  . Chronic systolic CHF (congestive heart failure) 08/14/2013  . Osteomyelitis of toe of left foot 08/13/2013  . AKI (acute kidney injury) 08/13/2013  . 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 Heme + stool 04/06/2013  . Decreased hemoglobin or hematocrit 04/06/2013  . Acute systolic CHF (congestive heart failure), NYHA class 4 03/28/2013  . Acute respiratory failure with hypoxia 03/27/2013  . Alcohol withdrawal delirium 03/27/2013  . Septic shock(785.52) 03/26/2013  . Severe sepsis(995.92) 03/26/2013  . Severe protein-calorie malnutrition 03/26/2013  . Septic shock 03/26/2013  . CHF (congestive heart failure) 03/25/2013  . Open wound of right foot 03/25/2013  . Ischemic foot 03/25/2013  . Smoking hx 03/25/2013  . Alcohol abuse 03/25/2013  . Bilateral leg edema 03/25/2013  . HTN (hypertension)    Past Medical History: Past Medical History  Diagnosis Date  . HTN (hypertension)   . Smoking hx 03/25/2013  . Alcohol abuse 03/25/2013  . CHF (congestive heart failure) 03/25/2013    Past Surgical History: Past Surgical History  Procedure Laterality Date  . Above knee leg amputation Right   . Amputation Right 03/26/2013    Procedure: AMPUTATION ABOVE KNEE;  Surgeon: Rosetta Posner, MD;  Location: [facility] OR;  Service: Vascular;  Laterality: Right;    Social History: History   Social History  . Marital Status: Single    Spouse  Name: N/A    Number of Children: N/A  . Years of Education: N/A   Social History Main Topics  . Smoking status: Current Some Day Smoker    Types: Cigarettes  . Smokeless tobacco: None  . Alcohol Use: No     Comment: everyday  . Drug Use: Yes    Special: Marijuana  . Sexual  Activity: No   Other Topics Concern  . None   Social History Narrative    Family History: Family History  Problem Relation Age of Onset  . Heart disease      No family history    Allergies: No Known Allergies  Current Facility-Administered Medications  Medication Dose Route Frequency Provider Last Rate Last Dose  . 0.9 %  sodium chloride infusion   Intravenous Continuous Shanda Howells, MD      . acetaminophen (TYLENOL) tablet 650 mg  650 mg Oral Q6H PRN Shanda Howells, MD      . Derrill Memo ON 02/05/2014] aspirin EC tablet 81 mg  81 mg Oral q morning - 10a Shanda Howells, MD      . atorvastatin (LIPITOR) tablet 40 mg  40 mg Oral QPM Shanda Howells, MD      . Derrill Memo ON 02/05/2014] carvedilol (COREG) tablet 6.25 mg  6.25 mg Oral BID WC Shanda Howells, MD      . escitalopram (LEXAPRO) tablet 5 mg  5 mg Oral Daily Shanda Howells, MD      . heparin injection 5,000 Units  5,000 Units Subcutaneous 3 times per day Shanda Howells, MD      . HYDROmorphone (DILAUDID) injection 1 mg  1 mg Intravenous Once Amparo Bristol, MD      . mirtazapine (REMERON) tablet 7.5 mg  7.5 mg Oral QHS Shanda Howells, MD      . morphine 2 MG/ML injection 2-4 mg  2-4 mg Intravenous Q2H PRN Shanda Howells, MD      . ondansetron [facility]) injection 4 mg  4 mg Intravenous Once Amparo Bristol, MD      . sodium chloride 0.9 % injection 3 mL  3 mL Intravenous Q12H Shanda Howells, MD      . thiamine (VITAMIN B-1) tablet 200 mg  200 mg Oral Daily Shanda Howells, MD      . vitamin C (ASCORBIC ACID) tablet 500 mg  500 mg Oral Daily Shanda Howells, MD      . zinc sulfate capsule 220 mg  220 mg Oral Daily Shanda Howells, MD       Current Outpatient Prescriptions  Medication Sig Dispense Refill  . acetaminophen (TYLENOL) 325 MG tablet Take 2 tablets (650 mg total) by mouth every 6 (six) hours as needed for mild pain.    Marland Kitchen aspirin EC 81 MG tablet Take 81 mg by mouth every morning.     Marland Kitchen atorvastatin (LIPITOR) 40 MG tablet Take 40 mg by mouth  every evening.    . carvedilol (COREG) 6.25 MG tablet Take 1 tablet (6.25 mg total) by mouth 2 (two) times daily with a meal.    . escitalopram (LEXAPRO) 5 MG tablet Take 5 mg by mouth daily.    . furosemide (LASIX) 40 MG tablet Take 40 mg by mouth daily as needed for fluid.    . mirtazapine (REMERON) 15 MG tablet Take 7.5 mg by mouth at bedtime.    . thiamine (VITAMIN B-1) 100 MG tablet Take 200 mg by mouth daily.    . vitamin C (ASCORBIC ACID) 500 MG tablet Take  500 mg by mouth daily.    Marland Kitchen zinc sulfate 220 MG capsule Take 220 mg by mouth daily.    Marland Kitchen doxycycline (VIBRAMYCIN) 100 MG capsule Take 1 capsule (100 mg total) by mouth 2 (two) times daily. 20 capsule 0  . HYDROcodone-acetaminophen (NORCO/VICODIN) 5-325 MG per tablet Take 1 tablet by mouth every 6 (six) hours as needed for moderate pain. 20 tablet 0  . levofloxacin (LEVAQUIN) 500 MG tablet Take 500 mg by mouth daily.    . mupirocin ointment (BACTROBAN) 2 % Apply 1 application topically 2 (two) times daily.      Review Of Systems: 12 point ROS negative except as noted above in HPI.  Physical Exam: Filed Vitals:   02/04/14 1824  BP: 127/94  Pulse: 85  Temp:   Resp: 29    General: cooperative and cachectic  HEENT: PERRLA and dry oral mucosa Heart: S1, S2 normal, no murmur, rub or gallop, regular rate and rhythm Lungs: clear to auscultation, no wheezes or rales and unlabored breathing Abdomen: abdomen is soft without significant tenderness, masses, organomegaly or guarding Extremities: L foot dry gangrene w/ peripheral erythema , R AKA  Skin:as above  Neurology: grossly normal UE exam, s/p R AKA- diminished sensation in LLE   Labs and Imaging:  PENDING    No results found.         Shanda Howells MD  Pager: [phone]

## 2014-02-04 NOTE — Consult Note (Signed)
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 male who presents for evaluation of gangrene left foot. Patient has previously had right above-knee dictation. He had evaluation by Dr. [name] 2 weeks ago and recommendation was made for left AKA. The patient refused. He returns today stating that he is ready for his leg to be amputated.   Past Medical History  Diagnosis Date  . HTN (hypertension)   . Smoking hx 03/25/2013  . Alcohol abuse 03/25/2013  . CHF (congestive heart failure) 03/25/2013   Past Surgical History  Procedure Laterality Date  . Above knee leg amputation Right   . Amputation Right 03/26/2013    Procedure: AMPUTATION ABOVE KNEE;  Surgeon: Rosetta Posner, MD;  Location: [place];  Service: Vascular;  Laterality: Right;   History   Social History  . Marital Status: Single    Spouse Name: N/A    Number of Children: N/A  . Years of Education: N/A   Social History Main Topics  . Smoking status: Current Some Day Smoker    Types: Cigarettes  . Smokeless tobacco: None  . Alcohol Use: No     Comment: everyday  . Drug Use: Yes    Special: Marijuana  . Sexual Activity: No   Other Topics Concern  . None   Social History Narrative   Family History  Problem Relation Age of Onset  . Heart disease      No family history   No Known Allergies Prior to Admission medications   Medication Sig Start Date End Date Taking? Authorizing Provider  acetaminophen (TYLENOL) 325 MG tablet Take 2 tablets (650 mg total) by mouth every 6 (six) hours as needed for mild pain. 08/22/13  Yes [name], MD  aspirin EC 81 MG tablet Take 81 mg by mouth every morning.    Yes Historical Provider, MD  atorvastatin (LIPITOR) 40 MG tablet Take 40 mg by mouth every evening. 04/07/13  Yes [name], MD  carvedilol (COREG) 6.25 MG tablet Take 1 tablet (6.25 mg total) by mouth 2 (two) times  daily with a meal. 04/07/13  Yes [name], MD  escitalopram (LEXAPRO) 5 MG tablet Take 5 mg by mouth daily. 07/31/13  Yes Historical Provider, MD  furosemide (LASIX) 40 MG tablet Take 40 mg by mouth daily as needed for fluid. 04/07/13  Yes [name], MD  mirtazapine (REMERON) 15 MG tablet Take 7.5 mg by mouth at bedtime.   Yes Historical Provider, MD  thiamine (VITAMIN B-1) 100 MG tablet Take 200 mg by mouth daily.   Yes Historical Provider, MD  vitamin C (ASCORBIC ACID) 500 MG tablet Take 500 mg by mouth daily.   Yes Historical Provider, MD  zinc sulfate 220 MG capsule Take 220 mg by mouth daily.   Yes Historical Provider, MD  doxycycline (VIBRAMYCIN) 100 MG capsule Take 1 capsule (100 mg total) by mouth 2 (two) times daily. 01/27/14   Thurnell Lose, MD  HYDROcodone-acetaminophen (NORCO/VICODIN) 5-325 MG per tablet Take 1 tablet by mouth every 6 (six) hours as needed for moderate pain. 01/27/14   Thurnell Lose, MD  levofloxacin (LEVAQUIN) 500 MG tablet Take 500 mg by mouth daily. 01/21/14   Historical Provider, MD  mupirocin ointment (BACTROBAN) 2 % Apply 1 application topically 2 (two) times daily.  01/21/14   Historical Provider, MD    All other systems have been reviewed and were  otherwise negative with the exception of those mentioned in the HPI and as above.  Physical Exam: Filed Vitals:   02/04/14 1946  BP: 108/54  Pulse: 88  Temp:   Resp: 17    General: Alert, no acute distress HEENT: Normal for age Cardiovascular: Regular rate and rhythm. Carotid pulses 2+, no bruits audible Respiratory: Clear to auscultation. No cyanosis, no use of accessory musculature GI: No organomegaly, abdomen is soft and non-tender Skin: No lesions in the area of chief complaint Neurologic: Sensation intact distally Psychiatric: Patient is competent for consent with normal mood and affect Musculoskeletal: No obvious deformities Extremities: right AKA Gangrenous changes distal left  foot with cellulitis. 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

## 2014-02-05 DIAGNOSIS — Z01810 Encounter for preprocedural cardiovascular examination: Secondary | ICD-10-CM

## 2014-02-05 DIAGNOSIS — Z720 Tobacco use: Secondary | ICD-10-CM

## 2014-02-05 LAB — SURGICAL PCR SCREEN
MRSA, PCR: NEGATIVE
Staphylococcus aureus: NEGATIVE

## 2014-02-05 LAB — CBC WITH DIFFERENTIAL/PLATELET
BASOS ABS: 0.1 10*3/uL (ref 0.0–0.1)
Basophils Relative: 1 % (ref 0–1)
Eosinophils Absolute: 0.3 10*3/uL (ref 0.0–0.7)
Eosinophils Relative: 3 % (ref 0–5)
HEMATOCRIT: 26.5 % — AB (ref 39.0–52.0)
Hemoglobin: 8.4 g/dL — ABNORMAL LOW (ref 13.0–17.0)
Lymphocytes Relative: 15 % (ref 12–46)
Lymphs Abs: 1.6 10*3/uL (ref 0.7–4.0)
MCH: 26 pg (ref 26.0–34.0)
MCHC: 31.7 g/dL (ref 30.0–36.0)
MCV: 82 fL (ref 78.0–100.0)
MONO ABS: 1 10*3/uL (ref 0.1–1.0)
Monocytes Relative: 9 % (ref 3–12)
NEUTROS ABS: 7.9 10*3/uL — AB (ref 1.7–7.7)
Neutrophils Relative %: 72 % (ref 43–77)
Platelets: 386 10*3/uL (ref 150–400)
RBC: 3.23 MIL/uL — ABNORMAL LOW (ref 4.22–5.81)
RDW: 14.7 % (ref 11.5–15.5)
WBC: 10.8 10*3/uL — ABNORMAL HIGH (ref 4.0–10.5)

## 2014-02-05 LAB — COMPREHENSIVE METABOLIC PANEL
ALT: 10 U/L (ref 0–53)
ANION GAP: 14 (ref 5–15)
AST: 14 U/L (ref 0–37)
Albumin: 2.5 g/dL — ABNORMAL LOW (ref 3.5–5.2)
Alkaline Phosphatase: 70 U/L (ref 39–117)
BILIRUBIN TOTAL: 0.3 mg/dL (ref 0.3–1.2)
BUN: 15 mg/dL (ref 6–23)
CHLORIDE: 101 meq/L (ref 96–112)
CO2: 19 meq/L (ref 19–32)
CREATININE: 0.94 mg/dL (ref 0.50–1.35)
Calcium: 8.9 mg/dL (ref 8.4–10.5)
GFR calc Af Amer: >90 mL/min (ref >90)
GFR, EST NON AFRICAN AMERICAN: 83 mL/min — AB (ref >90)
Glucose, Bld: 89 mg/dL (ref 70–99)
POTASSIUM: 4.1 meq/L (ref 3.7–5.3)
Sodium: 134 mEq/L — ABNORMAL LOW (ref 137–147)
Total Protein: 7.4 g/dL (ref 6.0–8.3)

## 2014-02-05 NOTE — Progress Notes (Signed)
RN notified PA that pt has refused to sign surgical consent form three times today.

## 2014-02-05 NOTE — Progress Notes (Signed)
Notified MD, surgical consent not signed. Pt also refusing evening medications. Pt sitting on side of bed, refuses to lie in bed, stating that it is uncomfortable. Pt has rejected all efforts by RN for pain medication and repositioning. Bed in lowest position, call bell within reach, will continue to monitor.

## 2014-02-05 NOTE — Consult Note (Signed)
Reason for Consult: Pre-operative Clearance Referring Physician: Dr. Aundra Dubin   HPI: The patient is a 68 year old male with a past medical history of cardiomyopathy and coronary artery disease for preoperative evaluation prior to peripheral vascular surgery. Patient had a cardiac catheterization in December of 2014. At that time, he was found to have a 30% left main, 40% ostial LAD, 80-90% proximal LAD, 80% first diagonal, occluded small ramus, occluded small first marginal, occluded second marginal and a 50% circumflex. There was a 75% mid right coronary artery followed by a 75% lesion. Echocardiogram December 2014 showed an ejection fraction of 15%. There was moderate to severe aortic insufficiency. There was moderate to severe mitral regurgitation. There was biatrial enlargement and the right ventricle was moderately dilated. There was severe tricuspid regurgitation. Cardiac MRI in December of 2014 showed an ejection fraction of 23%. There was some endocardial scar involving the anterolateral, lateral and inferior apical wall. No areas of nonviability. 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 There was also a small to moderate pericardial effusion. Cardiomyopathy was felt to mixed related to CAD and ETOH. Patient felt not to be a good candidate for coronary artery bypass grafting. PCI was considered but not clear patient would be compliant with antiplatelet medications. Decision was made to treat the patient medically.   He was admitted earlier this year on 08/13/13 with osteomyelitis of the first digit of the left lower extremity. He was also found to have acute renal insufficiency. He was seen by vascular surgery and we were asked to evaluate preoperatively prior to peripheral vascular surgery. At that time, he was evaluated by Dr. Stanford Breed and Dr. Aundra Dubin (see consult note from 5/19 and progress note from 5/20 for full details). They both agreed that he would not be a suitable candidate  for CABG and that given his issues with compliance, continued medical therapy vs PCI should be pursed. They also both agreed that he would he would be at high risk of surgical complications given unrevascularized CAD and cardiomyopathy. Despite this, his primary cardiologist, Dr. Aundra Dubin, outlined in his assessment on 08/20/13, "May proceed with surgery if absolutely necessary (which it may be)". Despite surgical clearance, the patient opted out of L AKA and was discharged home on extended course of oral antibiotics (levaquin and doxy). Despite reported compliance, he presents back with worsening erythema and pain and is now ready to pursue amputation. We have been asked to re-evaluate.  Since his last assessment earlier this year, he denies any chest pain, pressure, tightness, heaviness or burning discomfort. No dyspnea, orthopnea, PND or LEE. He notes occasional palpitations but denies syncope, near syncope. He reports medication compliance. Most recent EKG from 01/24/14 demonstrated NSR with nonspecific T wave abnormalities. His most recent 2D echo, also performed 10/24, demonstrated slight improvement in EF to 20-25% with severe diffuse hypokinesis. Visualization of the aortic valve revealed moderate regurgitation. Valve area (VTI): 1.38 cm^2. Valve area (Vmax): 1.54 cm^2. Valve area (Vmean): 1.49 cm^2. There was mild to moderate mitral valve regurgitation.   Past Medical History  Diagnosis Date  . HTN (hypertension)   . Smoking hx 03/25/2013  . Alcohol abuse 03/25/2013  . CHF (congestive heart failure) 03/25/2013    Past Surgical History  Procedure Laterality Date  . Above knee leg amputation Right   . Amputation Right 03/26/2013    Procedure: AMPUTATION ABOVE KNEE;  Surgeon: Rosetta Posner, MD;  Location: [facility];  Service: Vascular;  Laterality: Right;    Family History  Problem  Relation Age of Onset  . Heart disease      No family history    Social History:  reports that he has been  smoking Cigarettes.  He has been smoking about 0.00 packs per day. He does not have any smokeless tobacco history on file. He reports that he uses illicit drugs (Marijuana). He reports that he does not drink alcohol.  Allergies: No Known Allergies  Medications:  Prior to Admission medications   Medication Sig Start Date End Date Taking? Authorizing Provider  acetaminophen (TYLENOL) 325 MG tablet Take 2 tablets (650 mg total) by mouth every 6 (six) hours as needed for mild pain. 08/22/13  Yes [name], MD  aspirin EC 81 MG tablet Take 81 mg by mouth every morning.    Yes Historical Provider, MD  atorvastatin (LIPITOR) 40 MG tablet Take 40 mg by mouth every evening. 04/07/13  Yes [name], MD  carvedilol (COREG) 6.25 MG tablet Take 1 tablet (6.25 mg total) by mouth 2 (two) times daily with a meal. 04/07/13  Yes [name], MD  escitalopram (LEXAPRO) 5 MG tablet Take 5 mg by mouth daily. 07/31/13  Yes Historical Provider, MD  furosemide (LASIX) 40 MG tablet Take 40 mg by mouth daily as needed for fluid. 04/07/13  Yes [name], MD  mirtazapine (REMERON) 15 MG tablet Take 7.5 mg by mouth at bedtime.   Yes Historical Provider, MD  thiamine (VITAMIN B-1) 100 MG tablet Take 200 mg by mouth daily.   Yes Historical Provider, MD  vitamin C (ASCORBIC ACID) 500 MG tablet Take 500 mg by mouth daily.   Yes Historical Provider, MD  zinc sulfate 220 MG capsule Take 220 mg by mouth daily.   Yes Historical Provider, MD  doxycycline (VIBRAMYCIN) 100 MG capsule Take 1 capsule (100 mg total) by mouth 2 (two) times daily. 01/27/14   Thurnell Lose, MD  HYDROcodone-acetaminophen (NORCO/VICODIN) 5-325 MG per tablet Take 1 tablet by mouth every 6 (six) hours as needed for moderate pain. 01/27/14   Thurnell Lose, MD  levofloxacin (LEVAQUIN) 500 MG tablet Take 500 mg by mouth daily. 01/21/14   Historical Provider, MD  mupirocin ointment (BACTROBAN) 2 % Apply 1 application topically 2 (two) times  daily.  01/21/14   Historical Provider, MD     Results for orders placed or performed during the hospital encounter of 02/04/14 (from the past 48 hour(s))  CBC with Differential     Status: Abnormal   Collection Time: 02/04/14  6:53 PM  Result Value Ref Range   WBC 13.3 (H) 4.0 - 10.5 K/uL   RBC 3.77 (L) 4.22 - 5.81 MIL/uL   Hemoglobin 9.9 (L) 13.0 - 17.0 g/dL   HCT 30.9 (L) 39.0 - 52.0 %   MCV 82.0 78.0 - 100.0 fL   MCH 26.3 26.0 - 34.0 pg   MCHC 32.0 30.0 - 36.0 g/dL   RDW 14.6 11.5 - 15.5 %   Platelets 412 (H) 150 - 400 K/uL   Neutrophils Relative % 71 43 - 77 %   Neutro Abs 9.5 (H) 1.7 - 7.7 K/uL   Lymphocytes Relative 18 12 - 46 %   Lymphs Abs 2.4 0.7 - 4.0 K/uL   Monocytes Relative 8 3 - 12 %   Monocytes Absolute 1.0 0.1 - 1.0 K/uL   Eosinophils Relative 2 0 - 5 %   Eosinophils Absolute 0.3 0.0 - 0.7 K/uL   Basophils Relative 1 0 - 1 %  Basophils Absolute 0.1 0.0 - 0.1 K/uL  Basic metabolic panel     Status: Abnormal   Collection Time: 02/04/14  6:53 PM  Result Value Ref Range   Sodium 137 137 - 147 mEq/L   Potassium 4.1 3.7 - 5.3 mEq/L   Chloride 100 96 - 112 mEq/L   CO2 22 19 - 32 mEq/L   Glucose, Bld 92 70 - 99 mg/dL   BUN 15 6 - 23 mg/dL   Creatinine, Ser 0.90 0.50 - 1.35 mg/dL   Calcium 9.9 8.4 - 10.5 mg/dL   GFR calc non Af Amer 85 (L) >90 mL/min   GFR calc Af Amer >90 >90 mL/min    Comment: (NOTE) The eGFR has been calculated using the CKD EPI equation. This calculation has not been validated in all clinical situations. eGFR's persistently <90 mL/min signify possible Chronic Kidney Disease.    Anion gap 15 5 - 15  Sedimentation rate     Status: Abnormal   Collection Time: 02/04/14  6:53 PM  Result Value Ref Range   Sed Rate 124 (H) 0 - 16 mm/hr  C-reactive protein     Status: Abnormal   Collection Time: 02/04/14  6:53 PM  Result Value Ref Range   CRP 14.3 (H) <0.60 mg/dL    Comment: Performed at [facility]  APTT     Status: Abnormal    Collection Time: 02/04/14  6:53 PM  Result Value Ref Range   aPTT 49 (H) 24 - 37 seconds    Comment:        IF BASELINE aPTT IS ELEVATED, SUGGEST PATIENT RISK ASSESSMENT BE USED TO DETERMINE APPROPRIATE ANTICOAGULANT THERAPY.   Type and screen     Status: None   Collection Time: 02/04/14  7:05 PM  Result Value Ref Range   ABO/RH(D) O POS    Antibody Screen NEG    Sample Expiration 02/07/2014   Urine rapid drug screen (hosp performed)     Status: None   Collection Time: 02/04/14  7:48 PM  Result Value Ref Range   Opiates NONE DETECTED NONE DETECTED   Cocaine NONE DETECTED NONE DETECTED   Benzodiazepines NONE DETECTED NONE DETECTED   Amphetamines NONE DETECTED NONE DETECTED   Tetrahydrocannabinol NONE DETECTED NONE DETECTED   Barbiturates NONE DETECTED NONE DETECTED    Comment:        DRUG SCREEN FOR MEDICAL PURPOSES ONLY.  IF CONFIRMATION IS NEEDED FOR ANY PURPOSE, NOTIFY LAB WITHIN 5 DAYS.        LOWEST DETECTABLE LIMITS FOR URINE DRUG SCREEN Drug Class       Cutoff (ng/mL) Amphetamine      1000 Barbiturate      200 Benzodiazepine   536 Tricyclics       144 Opiates          300 Cocaine          300 THC              50   Ethanol     Status: None   Collection Time: 02/04/14  8:15 PM  Result Value Ref Range   Alcohol, Ethyl (B) <11 0 - 11 mg/dL    Comment:        LOWEST DETECTABLE LIMIT FOR SERUM ALCOHOL IS 11 mg/dL FOR MEDICAL PURPOSES ONLY   Lactic acid, plasma     Status: None   Collection Time: 02/04/14  9:25 PM  Result Value Ref Range   Lactic Acid, Venous 0.6  0.5 - 2.2 mmol/L  Ammonia     Status: None   Collection Time: 02/04/14  9:25 PM  Result Value Ref Range   Ammonia 21 11 - 60 umol/L  Surgical pcr screen     Status: None   Collection Time: 02/05/14  1:15 AM  Result Value Ref Range   MRSA, PCR NEGATIVE NEGATIVE   Staphylococcus aureus NEGATIVE NEGATIVE    Comment:        The Xpert SA Assay (FDA approved for NASAL specimens in patients over 65  years of age), is one component of a comprehensive surveillance program.  Test performance has been validated by [employer] for patients greater than or equal to 66 year old. It is not intended to diagnose infection nor to guide or monitor treatment.   Comprehensive metabolic panel     Status: Abnormal   Collection Time: 02/05/14  5:25 AM  Result Value Ref Range   Sodium 134 (L) 137 - 147 mEq/L   Potassium 4.1 3.7 - 5.3 mEq/L   Chloride 101 96 - 112 mEq/L   CO2 19 19 - 32 mEq/L   Glucose, Bld 89 70 - 99 mg/dL   BUN 15 6 - 23 mg/dL   Creatinine, Ser 0.94 0.50 - 1.35 mg/dL   Calcium 8.9 8.4 - 10.5 mg/dL   Total Protein 7.4 6.0 - 8.3 g/dL   Albumin 2.5 (L) 3.5 - 5.2 g/dL   AST 14 0 - 37 U/L   ALT 10 0 - 53 U/L   Alkaline Phosphatase 70 39 - 117 U/L   Total Bilirubin 0.3 0.3 - 1.2 mg/dL   GFR calc non Af Amer 83 (L) >90 mL/min   GFR calc Af Amer >90 >90 mL/min    Comment: (NOTE) The eGFR has been calculated using the CKD EPI equation. This calculation has not been validated in all clinical situations. eGFR's persistently <90 mL/min signify possible Chronic Kidney Disease.    Anion gap 14 5 - 15  CBC WITH DIFFERENTIAL     Status: Abnormal   Collection Time: 02/05/14  5:25 AM  Result Value Ref Range   WBC 10.8 (H) 4.0 - 10.5 K/uL   RBC 3.23 (L) 4.22 - 5.81 MIL/uL   Hemoglobin 8.4 (L) 13.0 - 17.0 g/dL   HCT 26.5 (L) 39.0 - 52.0 %   MCV 82.0 78.0 - 100.0 fL   MCH 26.0 26.0 - 34.0 pg   MCHC 31.7 30.0 - 36.0 g/dL   RDW 14.7 11.5 - 15.5 %   Platelets 386 150 - 400 K/uL   Neutrophils Relative % 72 43 - 77 %   Neutro Abs 7.9 (H) 1.7 - 7.7 K/uL   Lymphocytes Relative 15 12 - 46 %   Lymphs Abs 1.6 0.7 - 4.0 K/uL   Monocytes Relative 9 3 - 12 %   Monocytes Absolute 1.0 0.1 - 1.0 K/uL   Eosinophils Relative 3 0 - 5 %   Eosinophils Absolute 0.3 0.0 - 0.7 K/uL   Basophils Relative 1 0 - 1 %   Basophils Absolute 0.1 0.0 - 0.1 K/uL    Dg Foot Complete Left  02/04/2014    CLINICAL DATA:  Subsequent encounter for 3-4 week history of left foot and infection  EXAM: LEFT FOOT - COMPLETE 3+ VIEW  COMPARISON:  01/20/2014.  FINDINGS: Bones are diffusely demineralized. There is been further ostial lipase is involving the proximal phalanx of the great toe, consistent with progressing osteomyelitis. Overlying soft tissue gas  is again noted compatible with associated soft tissue infection. Chronic posttraumatic deformity is seen in the distal tibia and fibula.  IMPRESSION: Progressive bony loss involving proximal phalanx of the great toe consistent with ongoing bony infection.   Electronically Signed   By: Misty Stanley M.D.   On: 02/04/2014 19:36    Review of Systems  Respiratory: Negative for shortness of breath.   Cardiovascular: Positive for palpitations. Negative for chest pain, orthopnea, leg swelling and PND.  Neurological: Negative for dizziness and loss of consciousness.  All other systems reviewed and are negative.  Blood pressure 94/63, pulse 90, temperature 98.5 F (36.9 C), temperature source Oral, resp. rate 18, height 5' 5"  (1.651 m), weight 126 lb 5.2 oz (57.3 kg), SpO2 100 %. Physical Exam  Constitutional: He is oriented to person, place, and time. He appears well-developed and well-nourished. No distress.  Neck: No JVD present. Carotid bruit is not present.  Cardiovascular: Normal rate and regular rhythm.  Exam reveals no gallop and no friction rub.   Murmur (2/6 SM best heard at LLSB) heard. Respiratory: Effort normal and breath sounds normal. No respiratory distress. He has no wheezes.  GI: Soft. Bowel sounds are normal. He exhibits no distension and no mass. There is no tenderness.  Musculoskeletal: He exhibits no edema.  Neurological: He is oriented to person, place, and time.  Skin: Skin is warm and dry. He is not diaphoretic.  Psychiatric: He has a normal mood and affect. His behavior is normal.    Assessment/Plan: Principal Problem:   Foot  osteomyelitis, left Active Problems:   CHF (congestive heart failure)   HTN (hypertension)   Smoking hx   Alcohol abuse  1. Pre-operative assessment: Patient was evalauted his primary cardiologist, Dr. Aundra Dubin, for pre-operative assessment on 08/20/13, and was stratified as "high risk", however the patient chose to forego surgery at that time. Since then, his cardiac status has remained fairly the same. EF is only minimally improved from 15 to 20-25%. He continues to deny any anginal symptoms and no dyspnea. He appears euvolemic on physical exam. Given his unrevascularized CAD and severe cardiomyopathy, he remains high risk for surgical complications. However, at this point, he has failed antibiotics and AKA is likely his only option. Although he is high risk, surgery is not contraindicated. He may proceed with surgery with close monitoring. Recommend continuation of his BB and statin during the perioperative period. Given his cardiomyopathy, will need to monitor for ventricular arrhythmias  on telemetry as well as close monitoring of volume status: strict I/Os and low sodium diet. PRN Lasix. Continue daily ASA once cleared by vascular surgery.   2. CAD: denies CP. Continue medical therapy: ASA, Lipitor and Coreg  3. Systolic HF: EF 30-94%. Euvolemic on physical exam. Brief run of NSVT on telemetry. Continue Coreg. BP unable to tolerate ACE/ARB (mid 07W-KGS 811S systolic).    We will continue to follow along closely. MD to follow with further recommendations.   SIMMONS, [place] 02/05/2014, 2:36 PM     Personally seen and examined. Agree with above. I have reviewed medical records and agree with the fact that he remains high risk for surgery. He may however proceed without cardiac contraindication. Careful with fluid administration.  Candee Furbish, MD

## 2014-02-05 NOTE — Progress Notes (Signed)
  [facility] Progress Note  Spoke with patient regarding left AKA. He agrees to proceed with operation stating he "would not have returned to the hospital" if he wasn't agreeable. OR tomorrow for left above the knee amputation with Dr. Kellie Simmering tomorrow. RN to obtain consent. NPO after midnight.    Virgina Jock, PA-C [facility] Office: [phone] Pager: [phone] 02/05/2014 2:25 PM

## 2014-02-05 NOTE — Progress Notes (Signed)
Utilization review completed.  

## 2014-02-05 NOTE — Progress Notes (Signed)
Pt arrive to unit from ED in no s/s of distress. Pt oriented x 4. Pt oriented to unit and room. Whiteboard updated. Callbell within reach. Tele applied. VS stable. Report received from ED nurse prior to pt's arrival. Will continue to monitor.

## 2014-02-05 NOTE — Plan of Care (Signed)
Problem: Consults Goal: General Medical Patient Education See Patient Education Module for specific education.  Outcome: Completed/Met Date Met:  02/05/14 Goal: Diabetes Guidelines if Diabetic/Glucose > 140 If diabetic or lab glucose is > 140 mg/dl - Initiate Diabetes/Hyperglycemia Guidelines & Document Interventions  Outcome: Not Applicable Date Met:  20/72/18  Problem: Phase I Progression Outcomes Goal: Pain controlled with appropriate interventions Outcome: Completed/Met Date Met:  02/05/14 Goal: Voiding-avoid urinary catheter unless indicated Outcome: Not Applicable Date Met:  28/83/37 Goal: Hemodynamically stable Outcome: Completed/Met Date Met:  02/05/14  Problem: Phase II Progression Outcomes Goal: Obtain order to discontinue catheter if appropriate Outcome: Not Applicable Date Met:  44/51/46  Problem: Phase III Progression Outcomes Goal: Foley discontinued Outcome: Not Applicable Date Met:  04/79/98  Problem: Discharge Progression Outcomes Goal: Pain controlled with appropriate interventions Outcome: Completed/Met Date Met:  02/05/14 Goal: Tolerating diet Outcome: Completed/Met Date Met:  02/05/14

## 2014-02-05 NOTE — Progress Notes (Addendum)
[facility]                                                                              Patient Demographics  Rodney [name], is a 68 y.o. male, DOB - 24-Jan-1946, EAV:[id number]  Admit date - 02/04/2014   Admitting Physician Shanda Howells, MD  Outpatient Primary MD for the patient is [name], NP  LOS - 1   Chief Complaint  Patient presents with  . Toe Injury      HPI on 02/04/2014 This is a 68 y.o. year old male with significant past medical history of severe peripheral vascular disease s/p R AKA, chronic systolic heart failure, HTN, hx/o ETOH abuse presenting with L foot osteomyelitis. Pt noted to have been recently admitted for R foot osteomyelitis 10/22-10/27. Pt refused multiple times L AKA for treatment, which was recommendation by vascular surgery. Pt expressed understanding of consequences of surgical treatment and was discharged on extended course of oral abx (levaquin and doxy). Pt states that he has been compliant with regimen. Still with worsening redness, pain, discomfort per pt. States that he is now ready for amputation. " Can y'all do it tonight?" Vital signs stable. No labs, IV abx started, or imaging obtained prior to being asked for admission. Per EDP. Spoke with on call vascular surgery, who will preop pt.    Assessment & Plan   Left foot osteomyelitis -Vascular surgery consultation appreciated -Tentative left AKA scheduled for 02/06/2014 -Continue antibiotics, vancomycin and Zosyn -Blood cultures pending -ESR 124, CRP 14.3 (both elevated) -left foot xray: aggressive bony loss involving proximal phalanx of the great toe consistent with ongoing bony infection.  Ischemic cardiomyopathy with chronic systolic heart failure -patient appears to be euvolemic at this time -Will ask cardiology to evaluate for surgery, as he is high risk candidate -Last echocardiogram in 01/2014 showed EF 20-25% -continue to monitor daily weights, intake and  output  Hypertension -Continue Coreg  Alcohol abuse -denies recent use -level <11 -continue thiamine  Tobacco abuse -smoking cessation counseling given  Normocytic Anemia -Hemoglobin baseline around 9 -Will continue to monitor CBC  Code Status: full  Family Communication: none at bedside  Disposition Plan: admitted  Time Spent in minutes   30 minutes  Procedures  none  Consults   Vascular surgery Cardiology  DVT Prophylaxis  heparin  Lab Results  Component Value Date   PLT 386 02/05/2014    Medications  Scheduled Meds: . aspirin EC  81 mg Oral q morning - 10a  . atorvastatin  40 mg Oral QPM  . carvedilol  6.25 mg Oral BID WC  . escitalopram  5 mg Oral Daily  . heparin  5,000 Units Subcutaneous 3 times per day  . Influenza vac split quadrivalent PF  0.5 mL Intramuscular Tomorrow-1000  . mirtazapine  7.5 mg Oral QHS  . piperacillin-tazobactam (ZOSYN)  IV  3.375 g Intravenous Q8H  . pneumococcal 23 valent vaccine  0.5 mL Intramuscular Tomorrow-1000  . sodium chloride  3 mL Intravenous Q12H  . thiamine  200 mg Oral Daily  . vancomycin  1,000 mg Intravenous Q24H  . vitamin C  500 mg Oral Daily  . zinc sulfate  220 mg  Oral Daily   Continuous Infusions: . sodium chloride 75 mL/hr at 02/05/14 0827   PRN Meds:.acetaminophen, morphine injection  Antibiotics    Anti-infectives    Start     Dose/Rate Route Frequency Ordered Stop   02/05/14 2200  vancomycin (VANCOCIN) IVPB 1000 mg/200 mL premix     1,000 mg200 mL/hr over 60 Minutes Intravenous Every 24 hours 02/04/14 2025     02/05/14 0300  piperacillin-tazobactam (ZOSYN) IVPB 3.375 g     3.375 g12.5 mL/hr over 240 Minutes Intravenous Every 8 hours 02/04/14 2025     02/04/14 1900  piperacillin-tazobactam (ZOSYN) IVPB 3.375 g     3.375 g100 mL/hr over 30 Minutes Intravenous  Once 02/04/14 1855 02/04/14 2149   02/04/14 1900  vancomycin (VANCOCIN) IVPB 1000 mg/200 mL premix     1,000 mg200 mL/hr over 60  Minutes Intravenous  Once 02/04/14 1856 02/04/14 2322        Subjective:   Rodney [name] seen and examined today.  Patient is still hesitant regarding his surgery as he does not want an AKA. Patient only wants to have his toe amputated. He states "it's my leg, I can do what I want with it".  Patient denies chest pain, shortness of breath, abdominal pain, dizziness, headache.   Objective:   Filed Vitals:   02/04/14 2103 02/05/14 0612 02/05/14 0621 02/05/14 1019  BP:   108/49 94/63  Pulse:   88 90  Temp:   99.1 F (37.3 C) 98.5 F (36.9 C)  TempSrc:   Oral Oral  Resp:   18 18  Height: 5' 5" (1.651 m)     Weight: 56.6 kg (124 lb 12.5 oz) 57.3 kg (126 lb 5.2 oz)    SpO2:   100% 100%    Wt Readings from Last 3 Encounters:  02/05/14 57.3 kg (126 lb 5.2 oz)  01/22/14 47.628 kg (105 lb)  01/20/14 47.769 kg (105 lb 5 oz)     Intake/Output Summary (Last 24 hours) at 02/05/14 1128 Last data filed at 02/05/14 1045  Gross per 24 hour  Intake    950 ml  Output      0 ml  Net    950 ml    Exam  General: Well developed, well nourished, NAD, appears stated age  HEENT: NCAT, PERRLA, EOMI, Anicteic Sclera, mucous membranes moist.   Cardiovascular: S1 S2 auscultated, no rubs, murmurs or gallops. Regular rate and rhythm.  Respiratory: Clear to auscultation bilaterally with equal chest rise  Abdomen: Soft, nontender, nondistended, + bowel sounds  Extremities: R AKA, L foot gangrenous changes  Neuro: AAOx3, cranial nerves grossly intact.    Data Review   Micro Results Recent Results (from the past 240 hour(s))  Surgical pcr screen     Status: None   Collection Time: 02/05/14  1:15 AM  Result Value Ref Range Status   MRSA, PCR NEGATIVE NEGATIVE Final   Staphylococcus aureus NEGATIVE NEGATIVE Final    Comment:        The Xpert SA Assay (FDA approved for NASAL specimens in patients over 22 years of age), is one component of a comprehensive surveillance program.  Test  performance has been validated by [employer] for patients greater than or equal to 42 year old. It is not intended to diagnose infection nor to guide or monitor treatment.     Radiology Reports Dg Chest 2 View  01/24/2014   CLINICAL DATA:  COPD  EXAM: CHEST  2 VIEW  COMPARISON:  03/29/2013 and 03/27/2013  FINDINGS: Cardiomegaly appears stable. Mediastinal and hilar contours are normal and stable. Pulmonary vascularity is normal. Normal lung volumes. Streaky atelectasis is seen posteriorly at the left lung base. No airspace disease appreciated. Negative for pneumothorax. No evidence of pleural effusion, but the posterior costophrenic angles are partially excluded from the lateral view. No acute osseous abnormality.  IMPRESSION: Cardiomegaly streaky atelectasis at the left lung base.   Electronically Signed   By: Curlene Dolphin M.D.   On: 01/24/2014 17:11   Dg Foot Complete Left  02/04/2014   CLINICAL DATA:  Subsequent encounter for 3-4 week history of left foot and infection  EXAM: LEFT FOOT - COMPLETE 3+ VIEW  COMPARISON:  01/20/2014.  FINDINGS: Bones are diffusely demineralized. There is been further ostial lipase is involving the proximal phalanx of the great toe, consistent with progressing osteomyelitis. Overlying soft tissue gas is again noted compatible with associated soft tissue infection. Chronic posttraumatic deformity is seen in the distal tibia and fibula.  IMPRESSION: Progressive bony loss involving proximal phalanx of the great toe consistent with ongoing bony infection.   Electronically Signed   By: Misty Stanley M.D.   On: 02/04/2014 19:36   Dg Foot Complete Left  01/20/2014   CLINICAL DATA:  Left foot pain, infection. Necrosis of the great toe.  EXAM: LEFT FOOT - COMPLETE 3+ VIEW  COMPARISON:  MRI of left foot 08/14/2013. Plain films of the left great toe 08/13/2013.  FINDINGS: Soft tissues of the foot and great toe are severely swollen. There is a wound projecting along the  medial aspect of the great toe at the level of the IP joint. There has been progressive bony destructive change in the distal phalanx and head of the proximal phalanx of the great toe. All bones are markedly osteopenic. No fracture is identified.  IMPRESSION: Changes consistent with progressive osteomyelitis of the great toe where an open soft tissue wound is identified.   Electronically Signed   By: Inge Rise M.D.   On: 01/20/2014 18:31    CBC  Recent Labs Lab 02/04/14 1853 02/05/14 0525  WBC 13.3* 10.8*  HGB 9.9* 8.4*  HCT 30.9* 26.5*  PLT 412* 386  MCV 82.0 82.0  MCH 26.3 26.0  MCHC 32.0 31.7  RDW 14.6 14.7  LYMPHSABS 2.4 1.6  MONOABS 1.0 1.0  EOSABS 0.3 0.3  BASOSABS 0.1 0.1    Chemistries   Recent Labs Lab 02/04/14 1853 02/05/14 0525  NA 137 134*  K 4.1 4.1  CL 100 101  CO2 22 19  GLUCOSE 92 89  BUN 15 15  CREATININE 0.90 0.94  CALCIUM 9.9 8.9  AST  --  14  ALT  --  10  ALKPHOS  --  70  BILITOT  --  0.3   ------------------------------------------------------------------------------------------------------------------ estimated creatinine clearance is 60.1 mL/min (by C-G formula based on Cr of 0.94). ------------------------------------------------------------------------------------------------------------------ No results for input(s): HGBA1C in the last 72 hours. ------------------------------------------------------------------------------------------------------------------ No results for input(s): CHOL, HDL, LDLCALC, TRIG, CHOLHDL, LDLDIRECT in the last 72 hours. ------------------------------------------------------------------------------------------------------------------ No results for input(s): TSH, T4TOTAL, T3FREE, THYROIDAB in the last 72 hours.  Invalid input(s): FREET3 ------------------------------------------------------------------------------------------------------------------ No results for input(s): VITAMINB12, FOLATE, FERRITIN,  TIBC, IRON, RETICCTPCT in the last 72 hours.  Coagulation profile No results for input(s): INR, PROTIME in the last 168 hours.  No results for input(s): DDIMER in the last 72 hours.  Cardiac Enzymes No results for input(s): CKMB, TROPONINI, MYOGLOBIN in the last 168 hours.  Invalid  input(s): CK ------------------------------------------------------------------------------------------------------------------ Invalid input(s): POCBNP    Karsyn Jamie D.O. on 02/05/2014 at 11:28 AM  Between 7am to 7pm - Pager - [phone]  After 7pm go to [email or web address] - password [email or web address]  And look for the night coverage person covering for me after hours  [facility] Office  [phone]

## 2014-02-05 NOTE — Plan of Care (Signed)
Problem: Phase I Progression Outcomes Goal: OOB as tolerated unless otherwise ordered Outcome: Progressing Pt OOB to chair this morning. VSS, denies any pain at this time. Will continue to monitor closely.

## 2014-02-06 ENCOUNTER — Encounter (HOSPITAL_COMMUNITY): Payer: Self-pay | Admitting: Anesthesiology

## 2014-02-06 ENCOUNTER — Inpatient Hospital Stay (HOSPITAL_COMMUNITY): Payer: Medicare Other | Admitting: Anesthesiology

## 2014-02-06 ENCOUNTER — Encounter (HOSPITAL_COMMUNITY)
Admission: EM | Disposition: A | Discharge status: Skilled Nursing Facility | Payer: Self-pay | Source: Home / Self Care | Attending: Internal Medicine

## 2014-02-06 HISTORY — PX: AMPUTATION: SHX166

## 2014-02-06 LAB — CBC WITH DIFFERENTIAL/PLATELET
BASOS ABS: 0 10*3/uL (ref 0.0–0.1)
BASOS PCT: 0 % (ref 0–1)
EOS ABS: 0.5 10*3/uL (ref 0.0–0.7)
EOS PCT: 5 % (ref 0–5)
HEMATOCRIT: 25.6 % — AB (ref 39.0–52.0)
HEMOGLOBIN: 8.3 g/dL — AB (ref 13.0–17.0)
Lymphocytes Relative: 14 % (ref 12–46)
Lymphs Abs: 1.4 10*3/uL (ref 0.7–4.0)
MCH: 26.8 pg (ref 26.0–34.0)
MCHC: 32.4 g/dL (ref 30.0–36.0)
MCV: 82.6 fL (ref 78.0–100.0)
MONO ABS: 1.2 10*3/uL — AB (ref 0.1–1.0)
MONOS PCT: 11 % (ref 3–12)
Neutro Abs: 7.2 10*3/uL (ref 1.7–7.7)
Neutrophils Relative %: 70 % (ref 43–77)
Platelets: 377 10*3/uL (ref 150–400)
RBC: 3.1 MIL/uL — ABNORMAL LOW (ref 4.22–5.81)
RDW: 14.7 % (ref 11.5–15.5)
WBC: 10.3 10*3/uL (ref 4.0–10.5)

## 2014-02-06 LAB — COMPREHENSIVE METABOLIC PANEL
ALK PHOS: 73 U/L (ref 39–117)
ALT: 11 U/L (ref 0–53)
ANION GAP: 15 (ref 5–15)
AST: 16 U/L (ref 0–37)
Albumin: 2.5 g/dL — ABNORMAL LOW (ref 3.5–5.2)
BILIRUBIN TOTAL: 0.3 mg/dL (ref 0.3–1.2)
BUN: 12 mg/dL (ref 6–23)
CHLORIDE: 104 meq/L (ref 96–112)
CO2: 18 mEq/L — ABNORMAL LOW (ref 19–32)
Calcium: 8.9 mg/dL (ref 8.4–10.5)
Creatinine, Ser: 0.95 mg/dL (ref 0.50–1.35)
GFR calc Af Amer: >90 mL/min (ref >90)
GFR calc non Af Amer: 83 mL/min — ABNORMAL LOW (ref >90)
Glucose, Bld: 91 mg/dL (ref 70–99)
POTASSIUM: 3.8 meq/L (ref 3.7–5.3)
SODIUM: 137 meq/L (ref 137–147)
TOTAL PROTEIN: 7.5 g/dL (ref 6.0–8.3)

## 2014-02-06 LAB — PROTIME-INR
INR: 1.38 (ref 0.00–1.49)
Prothrombin Time: 17.1 seconds — ABNORMAL HIGH (ref 11.6–15.2)

## 2014-02-06 SURGERY — AMPUTATION, ABOVE KNEE
Anesthesia: General | Site: Leg Upper | Laterality: Left

## 2014-02-06 MED ORDER — DOXYCYCLINE HYCLATE 100 MG PO CAPS: 100.0000 mg | ORAL_CAPSULE | Freq: Two times a day (BID) | ORAL | Status: DC

## 2014-02-06 MED ORDER — PROPOFOL 10 MG/ML IV BOLUS
INTRAVENOUS | Status: AC
  Filled 2014-02-06: qty 20

## 2014-02-06 MED ORDER — LEVOFLOXACIN 500 MG PO TABS
500.0000 mg | ORAL_TABLET | Freq: Every day | ORAL | Status: DC
  Filled 2014-02-06: qty 1

## 2014-02-06 MED ORDER — LACTATED RINGERS IV SOLN
INTRAVENOUS | Status: DC | PRN
  Administered 2014-02-06: 08:00:00 via INTRAVENOUS

## 2014-02-06 MED ORDER — PHENYLEPHRINE HCL 10 MG/ML IJ SOLN
INTRAMUSCULAR | Status: DC | PRN
  Administered 2014-02-06 (×3): 80 ug via INTRAVENOUS

## 2014-02-06 MED ORDER — ONDANSETRON HCL 4 MG/2ML IJ SOLN
INTRAMUSCULAR | Status: AC
  Filled 2014-02-06: qty 2

## 2014-02-06 MED ORDER — 0.9 % SODIUM CHLORIDE (POUR BTL) OPTIME
TOPICAL | Status: DC | PRN
  Administered 2014-02-06: 1000 mL

## 2014-02-06 MED ORDER — HYDROCODONE-ACETAMINOPHEN 5-325 MG PO TABS
1.0000 | ORAL_TABLET | Freq: Four times a day (QID) | ORAL | Status: DC | PRN
  Administered 2014-02-06 – 2014-02-08 (×5): 1 via ORAL
  Filled 2014-02-06 (×7): qty 1

## 2014-02-06 MED ORDER — LIDOCAINE HCL 1 % IJ SOLN
INTRAMUSCULAR | Status: DC | PRN
  Administered 2014-02-06: 100 mg via INTRADERMAL

## 2014-02-06 MED ORDER — FUROSEMIDE 40 MG PO TABS
40.0000 mg | ORAL_TABLET | Freq: Every day | ORAL | Status: DC | PRN
  Filled 2014-02-06: qty 1

## 2014-02-06 MED ORDER — ONDANSETRON HCL 4 MG/2ML IJ SOLN
INTRAMUSCULAR | Status: DC | PRN
  Administered 2014-02-06: 4 mg via INTRAVENOUS

## 2014-02-06 MED ORDER — FENTANYL CITRATE 0.05 MG/ML IJ SOLN
INTRAMUSCULAR | Status: AC
  Filled 2014-02-06: qty 5

## 2014-02-06 MED ORDER — FENTANYL CITRATE 0.05 MG/ML IJ SOLN
INTRAMUSCULAR | Status: DC | PRN
  Administered 2014-02-06 (×4): 50 ug via INTRAVENOUS

## 2014-02-06 MED ORDER — ARTIFICIAL TEARS OP OINT
TOPICAL_OINTMENT | OPHTHALMIC | Status: DC | PRN
  Administered 2014-02-06: 1 via OPHTHALMIC

## 2014-02-06 MED ORDER — ONDANSETRON HCL 4 MG/2ML IJ SOLN: 4.0000 mg | Freq: Once | INTRAMUSCULAR | Status: DC | PRN

## 2014-02-06 MED ORDER — MIDAZOLAM HCL 2 MG/2ML IJ SOLN
INTRAMUSCULAR | Status: AC
  Filled 2014-02-06: qty 2

## 2014-02-06 MED ORDER — PROPOFOL 10 MG/ML IV BOLUS
INTRAVENOUS | Status: DC | PRN
  Administered 2014-02-06: 70 mg via INTRAVENOUS

## 2014-02-06 MED ORDER — LIDOCAINE HCL (CARDIAC) 20 MG/ML IV SOLN
INTRAVENOUS | Status: AC
  Filled 2014-02-06: qty 5

## 2014-02-06 MED ORDER — HYDROMORPHONE HCL 1 MG/ML IJ SOLN
0.2500 mg | INTRAMUSCULAR | Status: DC | PRN
Start: 2014-02-06 — End: 2014-02-06

## 2014-02-06 MED ORDER — ARTIFICIAL TEARS OP OINT
TOPICAL_OINTMENT | OPHTHALMIC | Status: AC
  Filled 2014-02-06: qty 3.5

## 2014-02-06 MED ORDER — DOXYCYCLINE HYCLATE 100 MG PO TABS
100.0000 mg | ORAL_TABLET | Freq: Two times a day (BID) | ORAL | Status: DC
  Administered 2014-02-06 – 2014-02-09 (×7): 100 mg via ORAL
  Filled 2014-02-06 (×8): qty 1

## 2014-02-06 SURGICAL SUPPLY — 54 items
BANDAGE ELASTIC 6 VELCRO ST LF (GAUZE/BANDAGES/DRESSINGS) ×2 IMPLANT
BANDAGE ESMARK 6X9 LF (GAUZE/BANDAGES/DRESSINGS) IMPLANT
BLADE SAW RECIP 87.9 MT (BLADE) ×2 IMPLANT
BNDG COHESIVE 6X5 TAN STRL LF (GAUZE/BANDAGES/DRESSINGS) ×2 IMPLANT
BNDG ESMARK 6X9 LF (GAUZE/BANDAGES/DRESSINGS)
BNDG GAUZE ELAST 4 BULKY (GAUZE/BANDAGES/DRESSINGS) ×2 IMPLANT
CANISTER SUCTION 2500CC (MISCELLANEOUS) ×2 IMPLANT
CLIP TI MEDIUM 6 (CLIP) ×4 IMPLANT
COVER SURGICAL LIGHT HANDLE (MISCELLANEOUS) ×2 IMPLANT
CUFF TOURNIQUET SINGLE 24IN (TOURNIQUET CUFF) IMPLANT
CUFF TOURNIQUET SINGLE 34IN LL (TOURNIQUET CUFF) IMPLANT
CUFF TOURNIQUET SINGLE 44IN (TOURNIQUET CUFF) IMPLANT
DRAPE ORTHO SPLIT 77X108 STRL (DRAPES) ×2
DRAPE PROXIMA HALF (DRAPES) IMPLANT
DRAPE SURG ORHT 6 SPLT 77X108 (DRAPES) ×2 IMPLANT
DRSG ADAPTIC 3X8 NADH LF (GAUZE/BANDAGES/DRESSINGS) ×2 IMPLANT
DRSG PAD ABDOMINAL 8X10 ST (GAUZE/BANDAGES/DRESSINGS) ×2 IMPLANT
ELECT REM PT RETURN 9FT ADLT (ELECTROSURGICAL) ×2
ELECTRODE REM PT RTRN 9FT ADLT (ELECTROSURGICAL) ×1 IMPLANT
EVACUATOR 1/8 PVC DRAIN (DRAIN) ×2 IMPLANT
GAUZE SPONGE 4X4 12PLY STRL (GAUZE/BANDAGES/DRESSINGS) ×4 IMPLANT
GLOVE BIOGEL PI IND STRL 6.5 (GLOVE) ×1 IMPLANT
GLOVE BIOGEL PI IND STRL 7.5 (GLOVE) ×1 IMPLANT
GLOVE BIOGEL PI INDICATOR 6.5 (GLOVE) ×1
GLOVE BIOGEL PI INDICATOR 7.5 (GLOVE) ×1
GLOVE SS BIOGEL STRL SZ 7 (GLOVE) ×2 IMPLANT
GLOVE SUPERSENSE BIOGEL SZ 7 (GLOVE) ×2
GLOVE SURG SS PI 7.0 STRL IVOR (GLOVE) ×2 IMPLANT
GOWN STRL REUS W/ TWL LRG LVL3 (GOWN DISPOSABLE) ×3 IMPLANT
GOWN STRL REUS W/ TWL XL LVL3 (GOWN DISPOSABLE) ×1 IMPLANT
GOWN STRL REUS W/TWL LRG LVL3 (GOWN DISPOSABLE) ×3
GOWN STRL REUS W/TWL XL LVL3 (GOWN DISPOSABLE) ×1
KIT BASIN OR (CUSTOM PROCEDURE TRAY) ×2 IMPLANT
KIT ROOM TURNOVER OR (KITS) ×2 IMPLANT
NS IRRIG 1000ML POUR BTL (IV SOLUTION) ×2 IMPLANT
PACK GENERAL/GYN (CUSTOM PROCEDURE TRAY) ×2 IMPLANT
PAD ARMBOARD 7.5X6 YLW CONV (MISCELLANEOUS) ×4 IMPLANT
PADDING CAST COTTON 6X4 STRL (CAST SUPPLIES) IMPLANT
SPONGE GAUZE 4X4 12PLY STER LF (GAUZE/BANDAGES/DRESSINGS) ×2 IMPLANT
SPONGE LAP 18X18 X RAY DECT (DISPOSABLE) ×2 IMPLANT
STAPLER VISISTAT 35W (STAPLE) ×2 IMPLANT
STOCKINETTE IMPERVIOUS LG (DRAPES) ×2 IMPLANT
SUT SILK 2 0 (SUTURE) ×1
SUT SILK 2 0 SH CR/8 (SUTURE) ×4 IMPLANT
SUT SILK 2-0 18XBRD TIE 12 (SUTURE) ×1 IMPLANT
SUT SILK 3 0 (SUTURE) ×1
SUT SILK 3-0 18XBRD TIE 12 (SUTURE) ×1 IMPLANT
SUT VIC AB 2-0 CT1 18 (SUTURE) ×2 IMPLANT
SUT VIC AB 2-0 CT1 27 (SUTURE) ×2
SUT VIC AB 2-0 CT1 TAPERPNT 27 (SUTURE) ×2 IMPLANT
TOWEL OR 17X24 6PK STRL BLUE (TOWEL DISPOSABLE) ×2 IMPLANT
TOWEL OR 17X26 10 PK STRL BLUE (TOWEL DISPOSABLE) ×2 IMPLANT
UNDERPAD 30X30 INCONTINENT (UNDERPADS AND DIAPERS) ×2 IMPLANT
WATER STERILE IRR 1000ML POUR (IV SOLUTION) ×2 IMPLANT

## 2014-02-06 NOTE — Anesthesia Procedure Notes (Signed)
Procedure Name: LMA Insertion Date/Time: 02/06/2014 7:49 AM Performed by: Ned Grace Pre-anesthesia Checklist: Patient identified, Timeout performed, Emergency Drugs available, Suction available and Patient being monitored Patient Re-evaluated:Patient Re-evaluated prior to inductionOxygen Delivery Method: Circle system utilized Preoxygenation: Pre-oxygenation with 100% oxygen Intubation Type: IV induction Ventilation: Mask ventilation without difficulty LMA: LMA inserted LMA Size: 5.0 Number of attempts: 1 Placement Confirmation: breath sounds checked- equal and bilateral and positive ETCO2 Tube secured with: Tape Dental Injury: Teeth and Oropharynx as per pre-operative assessment

## 2014-02-06 NOTE — Interval H&P Note (Signed)
History and Physical Interval Note:  02/06/2014 7:29 AM  Rodney [name]  has presented today for surgery, with the diagnosis of Left foot gangrene  The various methods of treatment have been discussed with the patient and family. After consideration of risks, benefits and other options for treatment, the patient has consented to  Procedure(s): AMPUTATION ABOVE KNEE (Left) as a surgical intervention .  The patient's history has been reviewed, patient examined, no change in status, stable for surgery.  I have reviewed the patient's chart and labs.  Questions were answered to the patient's satisfaction.     [name]

## 2014-02-06 NOTE — Anesthesia Preprocedure Evaluation (Signed)
Anesthesia Evaluation  Patient identified by MRN, date of birth, ID band Patient awake    Reviewed: Allergy & Precautions, H&P , NPO status , Patient's Chart, lab work & pertinent test results  Airway        Dental   Pulmonary Current Smoker,          Cardiovascular hypertension, + Peripheral Vascular Disease and +CHF     Neuro/Psych    GI/Hepatic   Endo/Other    Renal/GU Renal disease     Musculoskeletal   Abdominal   Peds  Hematology   Anesthesia Other Findings   Reproductive/Obstetrics                             Anesthesia Physical Anesthesia Plan  ASA: III  Anesthesia Plan: General   Post-op Pain Management:    Induction: Intravenous  Airway Management Planned: Oral ETT and LMA  Additional Equipment:   Intra-op Plan:   Post-operative Plan: Extubation in OR  Informed Consent: I have reviewed the patients History and Physical, chart, labs and discussed the procedure including the risks, benefits and alternatives for the proposed anesthesia with the patient or authorized representative who has indicated his/her understanding and acceptance.     Plan Discussed with: CRNA, Anesthesiologist and Surgeon  Anesthesia Plan Comments:         Anesthesia Quick Evaluation

## 2014-02-06 NOTE — Plan of Care (Signed)
Problem: Consults Goal: Skin Care Protocol Initiated - if Braden Score 18 or less If consults are not indicated, leave blank or document N/A  Outcome: Not Applicable Date Met:  03/50/09 Goal: Nutrition Consult-if indicated Outcome: Not Applicable Date Met:  38/18/29  Problem: Phase I Progression Outcomes Goal: OOB as tolerated unless otherwise ordered Outcome: Progressing

## 2014-02-06 NOTE — Transfer of Care (Signed)
Immediate Anesthesia Transfer of Care Note  Patient: Rodney [name]  Procedure(s) Performed: Procedure(s): AMPUTATION ABOVE KNEE (Left)  Patient Location: PACU  Anesthesia Type:General  Level of Consciousness: awake, patient cooperative and lethargic  Airway & Oxygen Therapy: Patient Spontanous Breathing and Patient connected to face mask oxygen  Post-op Assessment: Report given to PACU RN and Post -op Vital signs reviewed and stable  Post vital signs: Reviewed and stable  Complications: No apparent anesthesia complications

## 2014-02-06 NOTE — Op Note (Signed)
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 Left leg was prepped Betadine scrub and solution draped in routine sterile manner. The left foot was isolated with impervious stockinette. Skin flaps were marked for an above-knee" with equal anterior and posterior flaps basing femur about 6 inches above the knee joint. Incision was carried down through skin and subcutaneous tissue with scalpel. The muscle and fascia were divided with the Bovie. Femur was cleaned proximally with periosteal elevator and divided with a Stryker saw the edges smoothed with thoracic. Superficial femoral artery vein and nerve were all individually ligated with 2-0 silk ties and ligatures. Posterior muscles were divided with Bovie. Specimen removed from the table. Adequate hemostasis was achieved. Following thorough irrigation Hemovac drain brought out medially and laterally secured with silk sutures. The fascia was closed over the bone with interrupted 2-0 Vicryl subcutaneous tissue with running 2-0 Vicryl and the skin with clips sterile dressing applied patient taken to the recovery room in stable condition Procedure Details:   [name], MD 02/06/2014 9:01 AM

## 2014-02-06 NOTE — Progress Notes (Signed)
[facility]                                                                              Patient Demographics  Rodney [name], is a 68 y.o. male, DOB - [birth year], AGT:[id number]  Admit date - 02/04/2014   Admitting Physician Shanda Howells, MD  Outpatient Primary MD for the patient is [name], NP  LOS - 2   Chief Complaint  Patient presents with  . Toe Injury      HPI on 02/04/2014 This is a 68 y.o. year old male with significant past medical history of severe peripheral vascular disease s/p R AKA, chronic systolic heart failure, HTN, hx/o ETOH abuse presenting with L foot osteomyelitis. Pt noted to have been recently admitted for R foot osteomyelitis 10/22-10/27. Pt refused multiple times L AKA for treatment, which was recommendation by vascular surgery. Pt expressed understanding of consequences of surgical treatment and was discharged on extended course of oral abx (levaquin and doxy). Pt states that he has been compliant with regimen. Still with worsening redness, pain, discomfort per pt. States that he is now ready for amputation. " Can y'all do it tonight?" Vital signs stable. No labs, IV abx started, or imaging obtained prior to being asked for admission. Per EDP. Spoke with on call vascular surgery, who will preop pt.    Assessment & Plan   Left foot osteomyelitis -Continue antibiotics, vancomycin and Zosyn -Blood cultures show not growth to date -ESR 124, CRP 14.3 (both elevated) -left foot xray: aggressive bony loss involving proximal phalanx of the great toe consistent with ongoing bony infection. -vascular surgery consultation appreciated, patient had left AKA today. -Will consult PT and OT for evaluation and treatment  Ischemic cardiomyopathy with chronic systolic heart failure -patient appears to be euvolemic at this time -cardiology was consulted for surgical clearance as patient is high risk -Last echocardiogram in 01/2014 showed EF 20-25% -continue to  monitor daily weights, intake and output  Hypertension -Continue Coreg  Alcohol abuse -denies recent use -level <11 -continue thiamine  Tobacco abuse -smoking cessation counseling given  Normocytic Anemia -Hemoglobin baseline around 9 -Will continue to monitor CBC  Code Status: full  Family Communication: none at bedside  Disposition Plan: admitted  Time Spent in minutes   25 minutes  Procedures  none  Consults   Vascular surgery Cardiology  DVT Prophylaxis  heparin  Lab Results  Component Value Date   PLT 377 02/06/2014    Medications  Scheduled Meds: . aspirin EC  81 mg Oral q morning - 10a  . atorvastatin  40 mg Oral QPM  . carvedilol  6.25 mg Oral BID WC  . doxycycline  100 mg Oral Q12H  . escitalopram  5 mg Oral Daily  . heparin  5,000 Units Subcutaneous 3 times per day  . [MAR Hold] Influenza vac split quadrivalent PF  0.5 mL Intramuscular Tomorrow-1000  . mirtazapine  7.5 mg Oral QHS  . [MAR Hold] pneumococcal 23 valent vaccine  0.5 mL Intramuscular Tomorrow-1000  . sodium chloride  3 mL Intravenous Q12H  . thiamine  200 mg Oral Daily  . vitamin C  500 mg Oral Daily  . zinc sulfate  220 mg Oral Daily   Continuous Infusions: . sodium chloride 75 mL/hr at 02/05/14 2120   PRN Meds:.acetaminophen, furosemide, HYDROcodone-acetaminophen, morphine injection  Antibiotics    Anti-infectives    Start     Dose/Rate Route Frequency Ordered Stop   02/06/14 1115  doxycycline (VIBRAMYCIN) capsule 100 mg  Status:  Discontinued     100 mg Oral 2 times daily 02/06/14 1103 02/06/14 1108   02/06/14 1115  levofloxacin (LEVAQUIN) tablet 500 mg  Status:  Discontinued     500 mg Oral Daily 02/06/14 1103 02/06/14 1116   02/06/14 1115  doxycycline (VIBRA-TABS) tablet 100 mg     100 mg Oral Every 12 hours 02/06/14 1109     02/05/14 2200  vancomycin (VANCOCIN) IVPB 1000 mg/200 mL premix  Status:  Discontinued     1,000 mg200 mL/hr over 60 Minutes Intravenous Every  24 hours 02/04/14 2025 02/06/14 1103   02/05/14 0300  piperacillin-tazobactam (ZOSYN) IVPB 3.375 g  Status:  Discontinued     3.375 g12.5 mL/hr over 240 Minutes Intravenous Every 8 hours 02/04/14 2025 02/06/14 1116   02/04/14 1900  piperacillin-tazobactam (ZOSYN) IVPB 3.375 g     3.375 g100 mL/hr over 30 Minutes Intravenous  Once 02/04/14 1855 02/04/14 2149   02/04/14 1900  vancomycin (VANCOCIN) IVPB 1000 mg/200 mL premix     1,000 mg200 mL/hr over 60 Minutes Intravenous  Once 02/04/14 1856 02/04/14 2322        Subjective:   [name] seen and examined today.  Patient states he is in pain after surgery. He denies any shortness of breath chest pain, abdominal pain, nausea or vomiting.  Objective:   Filed Vitals:   02/06/14 0903 02/06/14 0915 02/06/14 0930 02/06/14 0945  BP: 113/62 91/55 92/49    Pulse: 94 92 87 85  Temp: 98.5 F (36.9 C)   98.6 F (37 C)  TempSrc:      Resp: 16 23 21 18   Height:      Weight:      SpO2: 98% 100% 100% 100%    Wt Readings from Last 3 Encounters:  02/06/14 57 kg (125 lb 10.6 oz)  01/22/14 47.628 kg (105 lb)  01/20/14 47.769 kg (105 lb 5 oz)     Intake/Output Summary (Last 24 hours) at 02/06/14 1210 Last data filed at 02/06/14 1165  Gross per 24 hour  Intake   2570 ml  Output   1050 ml  Net   1520 ml    Exam  General: Well developed, well nourished, NAD, appears stated age  HEENT: NCAT, mucous membranes moist.   Cardiovascular: S1 S2 auscultated, 2/6 SEM. Regular rate and rhythm.  Respiratory: Clear to auscultation bilaterally with equal chest rise  Abdomen: Soft, nontender, nondistended, + bowel sounds  Extremities: B/L AKA  Neuro: AAOx3, cranial nerves grossly intact.    Data Review   Micro Results Recent Results (from the past 240 hour(s))  Blood culture (routine x 2)     Status: None (Preliminary result)   Collection Time: 02/04/14  7:10 PM  Result Value Ref Range Status   Specimen Description BLOOD LEFT ARM   Final   Special Requests BOTTLES DRAWN AEROBIC AND ANAEROBIC 5CC  Final   Culture  Setup Time   Final    02/05/2014 00:43 Performed at [facility]    Culture   Final           BLOOD CULTURE RECEIVED NO GROWTH TO DATE CULTURE WILL BE HELD FOR 5  DAYS BEFORE ISSUING A FINAL NEGATIVE REPORT Performed at [facility]    Report Status PENDING  Incomplete  Blood culture (routine x 2)     Status: None (Preliminary result)   Collection Time: 02/04/14  7:10 PM  Result Value Ref Range Status   Specimen Description BLOOD ARM RIGHT  Final   Special Requests BOTTLES DRAWN AEROBIC AND ANAEROBIC 10CC  Final   Culture  Setup Time   Final    02/05/2014 00:43 Performed at [facility]    Culture   Final           BLOOD CULTURE RECEIVED NO GROWTH TO DATE CULTURE WILL BE HELD FOR 5 DAYS BEFORE ISSUING A FINAL NEGATIVE REPORT Performed at [facility]    Report Status PENDING  Incomplete  Surgical pcr screen     Status: None   Collection Time: 02/05/14  1:15 AM  Result Value Ref Range Status   MRSA, PCR NEGATIVE NEGATIVE Final   Staphylococcus aureus NEGATIVE NEGATIVE Final    Comment:        The Xpert SA Assay (FDA approved for NASAL specimens in patients over 34 years of age), is one component of a comprehensive surveillance program.  Test performance has been validated by [employer] for patients greater than or equal to 44 year old. It is not intended to diagnose infection nor to guide or monitor treatment.     Radiology Reports Dg Chest 2 View  01/24/2014   CLINICAL DATA:  COPD  EXAM: CHEST  2 VIEW  COMPARISON:  03/29/2013 and 03/27/2013  FINDINGS: Cardiomegaly appears stable. Mediastinal and hilar contours are normal and stable. Pulmonary vascularity is normal. Normal lung volumes. Streaky atelectasis is seen posteriorly at the left lung base. No airspace disease appreciated. Negative for pneumothorax. No evidence of pleural effusion, but the  posterior costophrenic angles are partially excluded from the lateral view. No acute osseous abnormality.  IMPRESSION: Cardiomegaly streaky atelectasis at the left lung base.   Electronically Signed   By: Curlene Dolphin M.D.   On: 01/24/2014 17:11   Dg Foot Complete Left  02/04/2014   CLINICAL DATA:  Subsequent encounter for 3-4 week history of left foot and infection  EXAM: LEFT FOOT - COMPLETE 3+ VIEW  COMPARISON:  01/20/2014.  FINDINGS: Bones are diffusely demineralized. There is been further ostial lipase is involving the proximal phalanx of the great toe, consistent with progressing osteomyelitis. Overlying soft tissue gas is again noted compatible with associated soft tissue infection. Chronic posttraumatic deformity is seen in the distal tibia and fibula.  IMPRESSION: Progressive bony loss involving proximal phalanx of the great toe consistent with ongoing bony infection.   Electronically Signed   By: Misty Stanley M.D.   On: 02/04/2014 19:36   Dg Foot Complete Left  01/20/2014   CLINICAL DATA:  Left foot pain, infection. Necrosis of the great toe.  EXAM: LEFT FOOT - COMPLETE 3+ VIEW  COMPARISON:  MRI of left foot 08/14/2013. Plain films of the left great toe 08/13/2013.  FINDINGS: Soft tissues of the foot and great toe are severely swollen. There is a wound projecting along the medial aspect of the great toe at the level of the IP joint. There has been progressive bony destructive change in the distal phalanx and head of the proximal phalanx of the great toe. All bones are markedly osteopenic. No fracture is identified.  IMPRESSION: Changes consistent with progressive osteomyelitis of the great toe where an open soft  tissue wound is identified.   Electronically Signed   By: Inge Rise M.D.   On: 01/20/2014 18:31    CBC  Recent Labs Lab 02/04/14 1853 02/05/14 0525 02/06/14 0420  WBC 13.3* 10.8* 10.3  HGB 9.9* 8.4* 8.3*  HCT 30.9* 26.5* 25.6*  PLT 412* 386 377  MCV 82.0 82.0 82.6    MCH 26.3 26.0 26.8  MCHC 32.0 31.7 32.4  RDW 14.6 14.7 14.7  LYMPHSABS 2.4 1.6 1.4  MONOABS 1.0 1.0 1.2*  EOSABS 0.3 0.3 0.5  BASOSABS 0.1 0.1 0.0    Chemistries   Recent Labs Lab 02/04/14 1853 02/05/14 0525 02/06/14 0420  NA 137 134* 137  K 4.1 4.1 3.8  CL 100 101 104  CO2 22 19 18*  GLUCOSE 92 89 91  BUN 15 15 12   CREATININE 0.90 0.94 0.95  CALCIUM 9.9 8.9 8.9  AST  --  14 16  ALT  --  10 11  ALKPHOS  --  70 73  BILITOT  --  0.3 0.3   ------------------------------------------------------------------------------------------------------------------ estimated creatinine clearance is 59.2 mL/min (by C-G formula based on Cr of 0.95). ------------------------------------------------------------------------------------------------------------------ No results for input(s): HGBA1C in the last 72 hours. ------------------------------------------------------------------------------------------------------------------ No results for input(s): CHOL, HDL, LDLCALC, TRIG, CHOLHDL, LDLDIRECT in the last 72 hours. ------------------------------------------------------------------------------------------------------------------ No results for input(s): TSH, T4TOTAL, T3FREE, THYROIDAB in the last 72 hours.  Invalid input(s): FREET3 ------------------------------------------------------------------------------------------------------------------ No results for input(s): VITAMINB12, FOLATE, FERRITIN, TIBC, IRON, RETICCTPCT in the last 72 hours.  Coagulation profile  Recent Labs Lab 02/06/14 0420  INR 1.38    No results for input(s): DDIMER in the last 72 hours.  Cardiac Enzymes No results for input(s): CKMB, TROPONINI, MYOGLOBIN in the last 168 hours.  Invalid input(s): CK ------------------------------------------------------------------------------------------------------------------ Invalid input(s): POCBNP    Freja Faro D.O. on 02/06/2014 at 12:10 PM  Between  7am to 7pm - Pager - [phone]  After 7pm go to [email or web address] - password [email or web address]  And look for the night coverage person covering for me after hours  [facility] Office  [phone]

## 2014-02-06 NOTE — Plan of Care (Signed)
Problem: Phase I Progression Outcomes Goal: OOB as tolerated unless otherwise ordered Outcome: Progressing  Problem: Phase II Progression Outcomes Goal: Progress activity as tolerated unless otherwise ordered Outcome: Progressing  Problem: Phase III Progression Outcomes Goal: Pain controlled on oral analgesia Outcome: Completed/Met Date Met:  02/06/14

## 2014-02-06 NOTE — H&P (View-Only) (Signed)
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 male who presents for evaluation of gangrene left foot. Patient has previously had right above-knee dictation. He had evaluation by Dr. [name] 2 weeks ago and recommendation was made for left AKA. The patient refused. He returns today stating that he is ready for his leg to be amputated.   Past Medical History  Diagnosis Date  . HTN (hypertension)   . Smoking hx 03/25/2013  . Alcohol abuse 03/25/2013  . CHF (congestive heart failure) 03/25/2013   Past Surgical History  Procedure Laterality Date  . Above knee leg amputation Right   . Amputation Right 03/26/2013    Procedure: AMPUTATION ABOVE KNEE;  Surgeon: Rosetta Posner, MD;  Location: [place];  Service: Vascular;  Laterality: Right;   History   Social History  . Marital Status: Single    Spouse Name: N/A    Number of Children: N/A  . Years of Education: N/A   Social History Main Topics  . Smoking status: Current Some Day Smoker    Types: Cigarettes  . Smokeless tobacco: None  . Alcohol Use: No     Comment: everyday  . Drug Use: Yes    Special: Marijuana  . Sexual Activity: No   Other Topics Concern  . None   Social History Narrative   Family History  Problem Relation Age of Onset  . Heart disease      No family history   No Known Allergies Prior to Admission medications   Medication Sig Start Date End Date Taking? Authorizing Provider  acetaminophen (TYLENOL) 325 MG tablet Take 2 tablets (650 mg total) by mouth every 6 (six) hours as needed for mild pain. 08/22/13  Yes [name], MD  aspirin EC 81 MG tablet Take 81 mg by mouth every morning.    Yes Historical Provider, MD  atorvastatin (LIPITOR) 40 MG tablet Take 40 mg by mouth every evening. 04/07/13  Yes [name], MD  carvedilol (COREG) 6.25 MG tablet Take 1 tablet (6.25 mg total) by mouth 2 (two) times  daily with a meal. 04/07/13  Yes [name], MD  escitalopram (LEXAPRO) 5 MG tablet Take 5 mg by mouth daily. 07/31/13  Yes Historical Provider, MD  furosemide (LASIX) 40 MG tablet Take 40 mg by mouth daily as needed for fluid. 04/07/13  Yes [name], MD  mirtazapine (REMERON) 15 MG tablet Take 7.5 mg by mouth at bedtime.   Yes Historical Provider, MD  thiamine (VITAMIN B-1) 100 MG tablet Take 200 mg by mouth daily.   Yes Historical Provider, MD  vitamin C (ASCORBIC ACID) 500 MG tablet Take 500 mg by mouth daily.   Yes Historical Provider, MD  zinc sulfate 220 MG capsule Take 220 mg by mouth daily.   Yes Historical Provider, MD  doxycycline (VIBRAMYCIN) 100 MG capsule Take 1 capsule (100 mg total) by mouth 2 (two) times daily. 01/27/14   Thurnell Lose, MD  HYDROcodone-acetaminophen (NORCO/VICODIN) 5-325 MG per tablet Take 1 tablet by mouth every 6 (six) hours as needed for moderate pain. 01/27/14   Thurnell Lose, MD  levofloxacin (LEVAQUIN) 500 MG tablet Take 500 mg by mouth daily. 01/21/14   Historical Provider, MD  mupirocin ointment (BACTROBAN) 2 % Apply 1 application topically 2 (two) times daily.  01/21/14   Historical Provider, MD    All other systems have been reviewed and were  otherwise negative with the exception of those mentioned in the HPI and as above.  Physical Exam: Filed Vitals:   02/04/14 1946  BP: 108/54  Pulse: 88  Temp:   Resp: 17    General: Alert, no acute distress HEENT: Normal for age Cardiovascular: Regular rate and rhythm. Carotid pulses 2+, no bruits audible Respiratory: Clear to auscultation. No cyanosis, no use of accessory musculature GI: No organomegaly, abdomen is soft and non-tender Skin: No lesions in the area of chief complaint Neurologic: Sensation intact distally Psychiatric: Patient is competent for consent with normal mood and affect Musculoskeletal: No obvious deformities Extremities: right AKA Gangrenous changes distal left  foot with cellulitis. 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

## 2014-02-06 NOTE — Anesthesia Postprocedure Evaluation (Signed)
Anesthesia Post Note  Patient: Rodney [name]  Procedure(s) Performed: Procedure(s) (LRB): AMPUTATION ABOVE KNEE (Left)  Anesthesia type: general  Patient location: PACU  Post pain: Pain level controlled  Post assessment: Patient's Cardiovascular Status Stable  Last Vitals:  Filed Vitals:   02/06/14 0945  BP:   Pulse: 85  Temp: 37 C  Resp: 18    Post vital signs: Reviewed and stable  Level of consciousness: sedated  Complications: No apparent anesthesia complications

## 2014-02-07 DIAGNOSIS — B999 Unspecified infectious disease: Secondary | ICD-10-CM

## 2014-02-07 LAB — CBC WITH DIFFERENTIAL/PLATELET
BASOS ABS: 0 10*3/uL (ref 0.0–0.1)
Basophils Relative: 0 % (ref 0–1)
Eosinophils Absolute: 0.4 10*3/uL (ref 0.0–0.7)
Eosinophils Relative: 3 % (ref 0–5)
HCT: 23.1 % — ABNORMAL LOW (ref 39.0–52.0)
Hemoglobin: 7.4 g/dL — ABNORMAL LOW (ref 13.0–17.0)
LYMPHS ABS: 1.7 10*3/uL (ref 0.7–4.0)
Lymphocytes Relative: 15 % (ref 12–46)
MCH: 26.1 pg (ref 26.0–34.0)
MCHC: 32 g/dL (ref 30.0–36.0)
MCV: 81.3 fL (ref 78.0–100.0)
MONO ABS: 1 10*3/uL (ref 0.1–1.0)
Monocytes Relative: 9 % (ref 3–12)
Neutro Abs: 8.3 10*3/uL — ABNORMAL HIGH (ref 1.7–7.7)
Neutrophils Relative %: 73 % (ref 43–77)
Platelets: 357 10*3/uL (ref 150–400)
RBC: 2.84 MIL/uL — ABNORMAL LOW (ref 4.22–5.81)
RDW: 14.6 % (ref 11.5–15.5)
WBC: 11.4 10*3/uL — AB (ref 4.0–10.5)

## 2014-02-07 LAB — COMPREHENSIVE METABOLIC PANEL
ALT: 9 U/L (ref 0–53)
AST: 17 U/L (ref 0–37)
Albumin: 2 g/dL — ABNORMAL LOW (ref 3.5–5.2)
Alkaline Phosphatase: 64 U/L (ref 39–117)
Anion gap: 12 (ref 5–15)
BUN: 9 mg/dL (ref 6–23)
CO2: 20 mEq/L (ref 19–32)
CREATININE: 0.83 mg/dL (ref 0.50–1.35)
Calcium: 8.2 mg/dL — ABNORMAL LOW (ref 8.4–10.5)
Chloride: 103 mEq/L (ref 96–112)
GFR calc Af Amer: >90 mL/min (ref >90)
GFR calc non Af Amer: 88 mL/min — ABNORMAL LOW (ref >90)
Glucose, Bld: 103 mg/dL — ABNORMAL HIGH (ref 70–99)
POTASSIUM: 4.1 meq/L (ref 3.7–5.3)
SODIUM: 135 meq/L — AB (ref 137–147)
Total Protein: 6.3 g/dL (ref 6.0–8.3)

## 2014-02-07 LAB — PREPARE RBC (CROSSMATCH)

## 2014-02-07 MED ORDER — FUROSEMIDE 10 MG/ML IJ SOLN
20.0000 mg | Freq: Once | INTRAMUSCULAR | Status: AC
  Administered 2014-02-07: 20 mg via INTRAVENOUS
  Filled 2014-02-07: qty 2

## 2014-02-07 MED ORDER — SODIUM CHLORIDE 0.9 % IV SOLN: Freq: Once | INTRAVENOUS | Status: DC

## 2014-02-07 MED ORDER — FUROSEMIDE 20 MG PO TABS
20.0000 mg | ORAL_TABLET | Freq: Every day | ORAL | Status: DC
  Filled 2014-02-07 (×2): qty 1

## 2014-02-07 NOTE — Plan of Care (Signed)
Problem: Phase III Progression Outcomes Goal: Voiding independently Outcome: Completed/Met Date Met:  02/07/14

## 2014-02-07 NOTE — Clinical Social Work Psychosocial (Signed)
Clinical Social Work Department BRIEF PSYCHOSOCIAL ASSESSMENT 02/07/2014  Patient:  Rodney [name], Rodney [name]     Account Number:  [id number]     Admit date:  02/04/2014  Clinical Social Worker:  Hubert Azure  Date/Time:  02/07/2014 05:38 PM  Referred by:  Physician  Date Referred:  02/07/2014 Referred for  SNF Placement   Other Referral:   Interview type:  Patient Other interview type:    PSYCHOSOCIAL DATA Living Status:  FACILITY Admitted from facility:  Other Level of care:  Assisted Living Primary support name:  Audry Pili Primary support relationship to patient:  FAMILY Degree of support available:   Unknown, patient unable to verbalize last communication with cousin Audry Pili).    CURRENT CONCERNS Current Concerns  Post-Acute Placement   Other Concerns:    SOCIAL WORK ASSESSMENT / PLAN CSW met with patient who was alert and oriented x4. CSW introduced self and explained role. CSW explained SNF placement process and discussed d/c plan. Per patient, he resides at [facility] in [place], [place]. Patient reports he has lived at the home for 1 year, but desires to live in another home. Patient is agreeable to SNF placement in [facility] and reports he uses a wheelchair to ambulate. Per patient, he does not have a support system, besides his cousin [place]. However, patient unable to state how frequency or last time spoke with cousin.    CSW contacted [facility] [phone]) and spoke with dayshift manager, Suanne Marker. Suanne Marker confirmed patient is a resident at home and is able to return to home when stable.   Assessment/plan status:  Other - See comment Other assessment/ plan:   CSW to submit PASARR and complete FL2 for SNF placement.   Information/referral to community resources:       PATIENT'S/FAMILY'S RESPONSE TO PLAN OF CARE: Patient was cooperative and desires to improve strength. Patient is looking forward to a "change in living situation."  [employer], [place] [facility] Weekend Clinical Social Worker [phone]

## 2014-02-07 NOTE — Progress Notes (Signed)
[facility]                                                                              Patient Demographics  Rodney [name], is a 68 y.o. male, DOB - [birth year], JQB:[id number]  Admit date - 02/04/2014   Admitting Physician Shanda Howells, MD  Outpatient Primary MD for the patient is [name], NP  LOS - 3   Chief Complaint  Patient presents with  . Toe Injury      HPI on 02/04/2014 This is a 68 y.o. year old male with significant past medical history of severe peripheral vascular disease s/p R AKA, chronic systolic heart failure, HTN, hx/o ETOH abuse presenting with L foot osteomyelitis. Pt noted to have been recently admitted for R foot osteomyelitis 10/22-10/27. Pt refused multiple times L AKA for treatment, which was recommendation by vascular surgery. Pt expressed understanding of consequences of surgical treatment and was discharged on extended course of oral abx (levaquin and doxy). Pt states that he has been compliant with regimen. Still with worsening redness, pain, discomfort per pt. States that he is now ready for amputation. " Can y'all do it tonight?" Vital signs stable. No labs, IV abx started, or imaging obtained prior to being asked for admission. Per EDP. Spoke with on call vascular surgery, who will preop pt.    Assessment & Plan   Left foot osteomyelitis -Continue antibiotics, vancomycin and Zosyn -Blood cultures show not growth to date -ESR 124, CRP 14.3 (both elevated) -left foot xray: aggressive bony loss involving proximal phalanx of the great toe consistent with ongoing bony infection. -vascular surgery consultation appreciated, patient had left AKA POD1 -Pending PT and OT for evaluation and treatment -Continue pain control   Ischemic cardiomyopathy with chronic systolic heart failure -patient appears to be euvolemic at this time -cardiology was consulted and following and recommended lasix 39m daily -Last echocardiogram in 01/2014 showed EF  20-25% -continue to monitor daily weights, intake and output  Hypertension -Continue Coreg  Alcohol abuse -denies recent use -level <11 -continue thiamine  Tobacco abuse -smoking cessation counseling given  Normocytic Anemia -Hemoglobin baseline around 9 -Hb today 7.4 -Will order 1uPRBCs, followed by lasix 282mIV -Will continue to monitor CBC  Code Status: full  Family Communication: none at bedside  Disposition Plan: admitted  Time Spent in minutes   20 minutes  Procedures  none  Consults   Vascular surgery Cardiology  DVT Prophylaxis  heparin  Lab Results  Component Value Date   PLT 357 02/07/2014    Medications  Scheduled Meds: . sodium chloride   Intravenous Once  . aspirin EC  81 mg Oral q morning - 10a  . atorvastatin  40 mg Oral QPM  . carvedilol  6.25 mg Oral BID WC  . doxycycline  100 mg Oral Q12H  . escitalopram  5 mg Oral Daily  . furosemide  20 mg Intravenous Once  . furosemide  20 mg Oral Daily  . heparin  5,000 Units Subcutaneous 3 times per day  . [MAR Hold] Influenza vac split quadrivalent PF  0.5 mL Intramuscular Tomorrow-1000  . mirtazapine  7.5 mg Oral QHS  . [MAR Hold] pneumococcal 23  valent vaccine  0.5 mL Intramuscular Tomorrow-1000  . sodium chloride  3 mL Intravenous Q12H  . thiamine  200 mg Oral Daily  . vitamin C  500 mg Oral Daily  . zinc sulfate  220 mg Oral Daily   Continuous Infusions: . sodium chloride 75 mL/hr at 02/07/14 0244   PRN Meds:.acetaminophen, furosemide, HYDROcodone-acetaminophen, morphine injection  Antibiotics    Anti-infectives    Start     Dose/Rate Route Frequency Ordered Stop   02/06/14 1115  doxycycline (VIBRAMYCIN) capsule 100 mg  Status:  Discontinued     100 mg Oral 2 times daily 02/06/14 1103 02/06/14 1108   02/06/14 1115  levofloxacin (LEVAQUIN) tablet 500 mg  Status:  Discontinued     500 mg Oral Daily 02/06/14 1103 02/06/14 1116   02/06/14 1115  doxycycline (VIBRA-TABS) tablet 100 mg      100 mg Oral Every 12 hours 02/06/14 1109     02/05/14 2200  vancomycin (VANCOCIN) IVPB 1000 mg/200 mL premix  Status:  Discontinued     1,000 mg200 mL/hr over 60 Minutes Intravenous Every 24 hours 02/04/14 2025 02/06/14 1103   02/05/14 0300  piperacillin-tazobactam (ZOSYN) IVPB 3.375 g  Status:  Discontinued     3.375 g12.5 mL/hr over 240 Minutes Intravenous Every 8 hours 02/04/14 2025 02/06/14 1116   02/04/14 1900  piperacillin-tazobactam (ZOSYN) IVPB 3.375 g     3.375 g100 mL/hr over 30 Minutes Intravenous  Once 02/04/14 1855 02/04/14 2149   02/04/14 1900  vancomycin (VANCOCIN) IVPB 1000 mg/200 mL premix     1,000 mg200 mL/hr over 60 Minutes Intravenous  Once 02/04/14 1856 02/04/14 2322      Subjective:   Rodney [name] seen and examined today.  Patient continues to complain of pain in his leg. He denies any shortness of breath chest pain, abdominal pain, nausea or vomiting.   Objective:   Filed Vitals:   02/06/14 2038 02/06/14 2104 02/07/14 0332 02/07/14 1015  BP: 105/45 109/45 109/51 116/55  Pulse: 75 75 73 80  Temp:  98.9 F (37.2 C) 98.5 F (36.9 C) 98.7 F (37.1 C)  TempSrc:  Oral Tympanic Oral  Resp:  19 15 20   Height:      Weight:   55 kg (121 lb 4.1 oz)   SpO2:  100% 100% 100%    Wt Readings from Last 3 Encounters:  02/07/14 55 kg (121 lb 4.1 oz)  01/22/14 47.628 kg (105 lb)  01/20/14 47.769 kg (105 lb 5 oz)     Intake/Output Summary (Last 24 hours) at 02/07/14 1033 Last data filed at 02/07/14 1020  Gross per 24 hour  Intake   1210 ml  Output   1025 ml  Net    185 ml    Exam  General: Well developed, well nourished, NAD, appears stated age  HEENT: NCAT, mucous membranes moist.   Cardiovascular: S1 S2 auscultated, 2/6 SEM. Regular rate and rhythm.  Respiratory: Clear to auscultation bilaterally with equal chest rise  Abdomen: Soft, nontender, nondistended, + bowel sounds  Extremities: B/L AKA  Neuro: AAOx3, cranial nerves grossly intact.     Data Review   Micro Results Recent Results (from the past 240 hour(s))  Blood culture (routine x 2)     Status: None (Preliminary result)   Collection Time: 02/04/14  7:10 PM  Result Value Ref Range Status   Specimen Description BLOOD LEFT ARM  Final   Special Requests BOTTLES DRAWN AEROBIC AND ANAEROBIC 5CC  Final  Culture  Setup Time   Final    02/05/2014 00:43 Performed at [facility]    Culture   Final           BLOOD CULTURE RECEIVED NO GROWTH TO DATE CULTURE WILL BE HELD FOR 5 DAYS BEFORE ISSUING A FINAL NEGATIVE REPORT Performed at [facility]    Report Status PENDING  Incomplete  Blood culture (routine x 2)     Status: None (Preliminary result)   Collection Time: 02/04/14  7:10 PM  Result Value Ref Range Status   Specimen Description BLOOD ARM RIGHT  Final   Special Requests BOTTLES DRAWN AEROBIC AND ANAEROBIC 10CC  Final   Culture  Setup Time   Final    02/05/2014 00:43 Performed at [facility]    Culture   Final           BLOOD CULTURE RECEIVED NO GROWTH TO DATE CULTURE WILL BE HELD FOR 5 DAYS BEFORE ISSUING A FINAL NEGATIVE REPORT Performed at [facility]    Report Status PENDING  Incomplete  Surgical pcr screen     Status: None   Collection Time: 02/05/14  1:15 AM  Result Value Ref Range Status   MRSA, PCR NEGATIVE NEGATIVE Final   Staphylococcus aureus NEGATIVE NEGATIVE Final    Comment:        The Xpert SA Assay (FDA approved for NASAL specimens in patients over 69 years of age), is one component of a comprehensive surveillance program.  Test performance has been validated by [employer] for patients greater than or equal to 83 year old. It is not intended to diagnose infection nor to guide or monitor treatment.     Radiology Reports Dg Chest 2 View  01/24/2014   CLINICAL DATA:  COPD  EXAM: CHEST  2 VIEW  COMPARISON:  03/29/2013 and 03/27/2013  FINDINGS: Cardiomegaly appears stable. Mediastinal and hilar  contours are normal and stable. Pulmonary vascularity is normal. Normal lung volumes. Streaky atelectasis is seen posteriorly at the left lung base. No airspace disease appreciated. Negative for pneumothorax. No evidence of pleural effusion, but the posterior costophrenic angles are partially excluded from the lateral view. No acute osseous abnormality.  IMPRESSION: Cardiomegaly streaky atelectasis at the left lung base.   Electronically Signed   By: Curlene Dolphin M.D.   On: 01/24/2014 17:11   Dg Foot Complete Left  02/04/2014   CLINICAL DATA:  Subsequent encounter for 3-4 week history of left foot and infection  EXAM: LEFT FOOT - COMPLETE 3+ VIEW  COMPARISON:  01/20/2014.  FINDINGS: Bones are diffusely demineralized. There is been further ostial lipase is involving the proximal phalanx of the great toe, consistent with progressing osteomyelitis. Overlying soft tissue gas is again noted compatible with associated soft tissue infection. Chronic posttraumatic deformity is seen in the distal tibia and fibula.  IMPRESSION: Progressive bony loss involving proximal phalanx of the great toe consistent with ongoing bony infection.   Electronically Signed   By: Misty Stanley M.D.   On: 02/04/2014 19:36   Dg Foot Complete Left  01/20/2014   CLINICAL DATA:  Left foot pain, infection. Necrosis of the great toe.  EXAM: LEFT FOOT - COMPLETE 3+ VIEW  COMPARISON:  MRI of left foot 08/14/2013. Plain films of the left great toe 08/13/2013.  FINDINGS: Soft tissues of the foot and great toe are severely swollen. There is a wound projecting along the medial aspect of the great toe at the level of the  IP joint. There has been progressive bony destructive change in the distal phalanx and head of the proximal phalanx of the great toe. All bones are markedly osteopenic. No fracture is identified.  IMPRESSION: Changes consistent with progressive osteomyelitis of the great toe where an open soft tissue wound is identified.    Electronically Signed   By: Inge Rise M.D.   On: 01/20/2014 18:31    CBC  Recent Labs Lab 02/04/14 1853 02/05/14 0525 02/06/14 0420 02/07/14 0315  WBC 13.3* 10.8* 10.3 11.4*  HGB 9.9* 8.4* 8.3* 7.4*  HCT 30.9* 26.5* 25.6* 23.1*  PLT 412* 386 377 357  MCV 82.0 82.0 82.6 81.3  MCH 26.3 26.0 26.8 26.1  MCHC 32.0 31.7 32.4 32.0  RDW 14.6 14.7 14.7 14.6  LYMPHSABS 2.4 1.6 1.4 1.7  MONOABS 1.0 1.0 1.2* 1.0  EOSABS 0.3 0.3 0.5 0.4  BASOSABS 0.1 0.1 0.0 0.0    Chemistries   Recent Labs Lab 02/04/14 1853 02/05/14 0525 02/06/14 0420 02/07/14 0315  NA 137 134* 137 135*  K 4.1 4.1 3.8 4.1  CL 100 101 104 103  CO2 22 19 18* 20  GLUCOSE 92 89 91 103*  BUN 15 15 12 9   CREATININE 0.90 0.94 0.95 0.83  CALCIUM 9.9 8.9 8.9 8.2*  AST  --  14 16 17   ALT  --  10 11 9   ALKPHOS  --  70 73 64  BILITOT  --  0.3 0.3 <0.2*   ------------------------------------------------------------------------------------------------------------------ estimated creatinine clearance is 65.3 mL/min (by C-G formula based on Cr of 0.83). ------------------------------------------------------------------------------------------------------------------ No results for input(s): HGBA1C in the last 72 hours. ------------------------------------------------------------------------------------------------------------------ No results for input(s): CHOL, HDL, LDLCALC, TRIG, CHOLHDL, LDLDIRECT in the last 72 hours. ------------------------------------------------------------------------------------------------------------------ No results for input(s): TSH, T4TOTAL, T3FREE, THYROIDAB in the last 72 hours.  Invalid input(s): FREET3 ------------------------------------------------------------------------------------------------------------------ No results for input(s): VITAMINB12, FOLATE, FERRITIN, TIBC, IRON, RETICCTPCT in the last 72 hours.  Coagulation profile  Recent Labs Lab 02/06/14 0420  INR  1.38    No results for input(s): DDIMER in the last 72 hours.  Cardiac Enzymes No results for input(s): CKMB, TROPONINI, MYOGLOBIN in the last 168 hours.  Invalid input(s): CK ------------------------------------------------------------------------------------------------------------------ Invalid input(s): POCBNP    Heidee Audi D.O. on 02/07/2014 at 10:33 AM  Between 7am to 7pm - Pager - [phone]  After 7pm go to [email or web address] - password [email or web address]  And look for the night coverage person covering for me after hours  [facility] Office  [phone]

## 2014-02-07 NOTE — Progress Notes (Signed)
Physical Therapy Evaluation Patient Details Name: Rodney [name] MRN: [id number] DOB: 18-Oct-1945 Today's Date: 02/07/2014   History of Present Illness  Rodney [name] is a 68 y.o. 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  Clinical Impression  Patient agreeable to PT evaluation for sitting balance and bed mobility, but unable to transfer due to leg pain today.  Patient reported he has been sitting up, and used bedside commode with nursing assistance earlier today.  Fair sitting unsupported balance at edge of bed, good use of UE's for mobility.  Patient is appropriate for continued skilled PT services, and will benefit from slide board transfer training and w/c mobility training with loss of L LE.    Follow Up Recommendations SNF    Equipment Recommendations  None recommended by PT    Recommendations for Other Services       Precautions / Restrictions Precautions Precautions: Fall Restrictions Weight Bearing Restrictions: Yes LUE Weight Bearing: Non weight bearing      Mobility  Bed Mobility Overal bed mobility: Needs Assistance Bed Mobility: Supine to Sit     Supine to sit: Min guard     General bed mobility comments: Pain limitation L LE  Transfers Overall transfer level: Needs assistance               General transfer comment: Not performed on eval due to pain, patient reports he had 2 nurses help him to bedside commode earlier today  Ambulation/Gait                Stairs            Wheelchair Mobility    Modified Rankin (Stroke Patients Only)       Balance Overall balance assessment: Needs assistance Sitting-balance support: Single extremity supported Sitting balance-Leahy Scale: Fair                                       Pertinent Vitals/Pain  Pain Assessment: 0-10 Pain Score: 9  Pain Location: Left leg surgical site Pain Descriptors / Indicators: Stabbing;Tender;Throbbing Pain Intervention(s): Limited activity within patient's tolerance;Monitored during session;Repositioned;Relaxation    Home Living Family/patient expects to be discharged to:: Group home                      Prior Function Level of Independence: Independent with assistive device(s)         Comments: Per patient, unable to verify, States he has prosthetic R leg and w/c.     Hand Dominance        Extremity/Trunk Assessment   Upper Extremity Assessment: Overall WFL for tasks assessed           Lower Extremity Assessment: RLE deficits/detail;LLE deficits/detail RLE Deficits / Details: Above knee amputation Jan 2015, with full ROM LLE Deficits / Details: Above knee amputation 02/06/14, acute pain     Communication   Communication: No difficulties  Cognition Arousal/Alertness: Awake/alert Behavior During Therapy: WFL for tasks assessed/performed Overall Cognitive Status: Within Functional Limits for tasks assessed                      General Comments      Exercises  Assessment/Plan    PT Assessment Patient needs continued PT services  PT Diagnosis Generalized weakness;Acute pain   PT Problem List Decreased activity tolerance;Decreased balance;Decreased mobility;Pain  PT Treatment Interventions Functional mobility training;Therapeutic activities;Therapeutic exercise;Neuromuscular re-education;Patient/family education;Wheelchair mobility training   PT Goals (Current goals can be found in the Care Plan section) Acute Rehab PT Goals Patient Stated Goal: Find a new home, go home. PT Goal Formulation: With patient Time For Goal Achievement: 02/21/14 Potential to Achieve Goals: Good    Frequency Min 3X/week   Barriers to discharge Decreased caregiver support      Co-evaluation               End of  Session   Activity Tolerance: Patient limited by pain Patient left: in bed;with call bell/phone within reach Nurse Communication: Mobility status         Time: 1600-1630 PT Time Calculation (min): 30 min   Charges:   PT Evaluation $Initial PT Evaluation Tier I: 1 Procedure     PT G CodesZenia Resides, Arrick Dutton L 03-06-2014, 4:50 PM

## 2014-02-07 NOTE — Progress Notes (Signed)
   [facility]  Subjective  - Doing well over all.  Sitting up at bed side to eat breakfst   Objective 109/51 73 98.5 F (36.9 C) (Tympanic) 15 100%  Intake/Output Summary (Last 24 hours) at 02/07/14 0857 Last data filed at 02/07/14 0657  Gross per 24 hour  Intake    930 ml  Output   1025 ml  Net    -95 ml    Skin warm on left thigh, dressing clean and dry. No pain to palpation.  Assessment/Planning: POD #1  S/P AKA Will change dressing tomorrow.  He has a right AKA as well. Will D/C drains tomorrow with dressing change.  Laurence Slate [facility] 02/07/2014 8:57 AM --  Laboratory Lab Results:  Recent Labs  02/06/14 0420 02/07/14 0315  WBC 10.3 11.4*  HGB 8.3* 7.4*  HCT 25.6* 23.1*  PLT 377 357   BMET  Recent Labs  02/06/14 0420 02/07/14 0315  NA 137 135*  K 3.8 4.1  CL 104 103  CO2 18* 20  GLUCOSE 91 103*  BUN 12 9  CREATININE 0.95 0.83  CALCIUM 8.9 8.2*    COAG Lab Results  Component Value Date   INR 1.38 02/06/2014   INR 1.28 01/24/2014   INR 1.25 04/02/2013   No results found for: PTT

## 2014-02-07 NOTE — Progress Notes (Signed)
Primary cardiologist: Dr. Loralie Champagne  Seen for followup: Perioperative cardiac management  Subjective:    Patient complains of mild to moderate wound pain status post left above-the-knee amputation. No chest pain or breathlessness.  Objective:   Temp:  [98.4 F (36.9 C)-98.9 F (37.2 C)] 98.5 F (36.9 C) (11/07 0332) Pulse Rate:  [70-85] 73 (11/07 0332) Resp:  [15-20] 15 (11/07 0332) BP: (85-109)/(40-51) 109/51 mmHg (11/07 0332) SpO2:  [100 %] 100 % (11/07 0332) Weight:  [121 lb 4.1 oz (55 kg)] 121 lb 4.1 oz (55 kg) (11/07 0332) Last BM Date: 02/04/14  Filed Weights   02/05/14 0612 02/06/14 0515 02/07/14 0332  Weight: 126 lb 5.2 oz (57.3 kg) 125 lb 10.6 oz (57 kg) 121 lb 4.1 oz (55 kg)    Intake/Output Summary (Last 24 hours) at 02/07/14 0942 Last data filed at 02/07/14 0657  Gross per 24 hour  Intake    930 ml  Output   1025 ml  Net    -95 ml    Telemetry: Sinus rhythm, burst of VSVT noted.  Exam:  General: Chronically ill-appearing, no distress.  Lungs: Clear, nonlabored.  Cardiac: RRR, 2/6 systolic murmur without gallop.  Extremities: Leg dressed, status post left above-the-knee amputation.   Lab Results:  Basic Metabolic Panel:  Recent Labs Lab 02/05/14 0525 02/06/14 0420 02/07/14 0315  NA 134* 137 135*  K 4.1 3.8 4.1  CL 101 104 103  CO2 19 18* 20  GLUCOSE 89 91 103*  BUN 15 12 9   CREATININE 0.94 0.95 0.83  CALCIUM 8.9 8.9 8.2*    Liver Function Tests:  Recent Labs Lab 02/05/14 0525 02/06/14 0420 02/07/14 0315  AST 14 16 17   ALT 10 11 9   ALKPHOS 70 73 64  BILITOT 0.3 0.3 <0.2*  PROT 7.4 7.5 6.3  ALBUMIN 2.5* 2.5* 2.0*    CBC:  Recent Labs Lab 02/05/14 0525 02/06/14 0420 02/07/14 0315  WBC 10.8* 10.3 11.4*  HGB 8.4* 8.3* 7.4*  HCT 26.5* 25.6* 23.1*  MCV 82.0 82.6 81.3  PLT 386 377 357    Echocardiogram (01/24/2014): Study Conclusions  - Left ventricle: The cavity size was normal. Systolic function  was severely reduced. The estimated ejection fraction was in the range of 20% to 25%. Severe diffuse hypokinesis. Although no diagnostic regional wall motion abnormality was identified, this possibility cannot be completely excluded on the basis of this study. - Aortic valve: There was moderate regurgitation. Valve area (VTI): 1.38 cm^2. Valve area (Vmax): 1.54 cm^2. Valve area (Vmean): 1.49 cm^2. - Mitral valve: There was mild to moderate regurgitation. - Left atrium: The atrium was mildly dilated.   Medications:   Scheduled Medications: . sodium chloride   Intravenous Once  . aspirin EC  81 mg Oral q morning - 10a  . atorvastatin  40 mg Oral QPM  . carvedilol  6.25 mg Oral BID WC  . doxycycline  100 mg Oral Q12H  . escitalopram  5 mg Oral Daily  . furosemide  20 mg Intravenous Once  . heparin  5,000 Units Subcutaneous 3 times per day  . [MAR Hold] Influenza vac split quadrivalent PF  0.5 mL Intramuscular Tomorrow-1000  . mirtazapine  7.5 mg Oral QHS  . [MAR Hold] pneumococcal 23 valent vaccine  0.5 mL Intramuscular Tomorrow-1000  . sodium chloride  3 mL Intravenous Q12H  . thiamine  200 mg Oral Daily  . vitamin C  500 mg Oral Daily  . zinc sulfate  220  mg Oral Daily     Infusions: . sodium chloride 75 mL/hr at 02/07/14 0244     PRN Medications:  acetaminophen, furosemide, HYDROcodone-acetaminophen, morphine injection   Assessment:   1. POD# 2 status post left above-the-knee amputation.  2. Ischemic cardiomyopathy, recent LVEF 20-25% with plan for medical therapy and unfavorable revascularization options. Patient tolerated surgery above without obvious adverse cardiac event. NSVT noted on monitor. He is not reporting any chest pain.currently on maintenance IV fluids, intake more than output by 1300 cc last 48 hours.   Plan/Discussion:    Continues on aspirin, Coreg, Lipitor, subcutaneous heparin. Not on ACE inhibitor or ARB with relatively low blood  pressures. Will place on Lasix 20 mg daily for now, reduce IV fluids. Follow intake and output.   Satira Sark, M.D., F.A.C.C.

## 2014-02-08 LAB — COMPREHENSIVE METABOLIC PANEL
ALT: 10 U/L (ref 0–53)
ANION GAP: 11 (ref 5–15)
AST: 15 U/L (ref 0–37)
Albumin: 2 g/dL — ABNORMAL LOW (ref 3.5–5.2)
Alkaline Phosphatase: 65 U/L (ref 39–117)
BUN: 12 mg/dL (ref 6–23)
CHLORIDE: 106 meq/L (ref 96–112)
CO2: 21 meq/L (ref 19–32)
CREATININE: 0.69 mg/dL (ref 0.50–1.35)
Calcium: 8.3 mg/dL — ABNORMAL LOW (ref 8.4–10.5)
GLUCOSE: 95 mg/dL (ref 70–99)
Potassium: 3.9 mEq/L (ref 3.7–5.3)
Sodium: 138 mEq/L (ref 137–147)
Total Protein: 6.3 g/dL (ref 6.0–8.3)

## 2014-02-08 LAB — CBC WITH DIFFERENTIAL/PLATELET
BASOS ABS: 0.1 10*3/uL (ref 0.0–0.1)
BASOS PCT: 0 % (ref 0–1)
Eosinophils Absolute: 0.5 10*3/uL (ref 0.0–0.7)
Eosinophils Relative: 5 % (ref 0–5)
HCT: 26 % — ABNORMAL LOW (ref 39.0–52.0)
Hemoglobin: 8.7 g/dL — ABNORMAL LOW (ref 13.0–17.0)
Lymphocytes Relative: 21 % (ref 12–46)
Lymphs Abs: 2.4 10*3/uL (ref 0.7–4.0)
MCH: 27.6 pg (ref 26.0–34.0)
MCHC: 33.5 g/dL (ref 30.0–36.0)
MCV: 82.5 fL (ref 78.0–100.0)
Monocytes Absolute: 1.1 10*3/uL — ABNORMAL HIGH (ref 0.1–1.0)
Monocytes Relative: 10 % (ref 3–12)
NEUTROS ABS: 7.4 10*3/uL (ref 1.7–7.7)
Neutrophils Relative %: 64 % (ref 43–77)
PLATELETS: 330 10*3/uL (ref 150–400)
RBC: 3.15 MIL/uL — ABNORMAL LOW (ref 4.22–5.81)
RDW: 15.3 % (ref 11.5–15.5)
WBC: 11.5 10*3/uL — AB (ref 4.0–10.5)

## 2014-02-08 LAB — TYPE AND SCREEN
ABO/RH(D): O POS
Antibody Screen: NEGATIVE
Unit division: 0

## 2014-02-08 NOTE — Clinical Social Work Placement (Addendum)
Clinical Social Work Department CLINICAL SOCIAL WORK PLACEMENT NOTE 02/08/2014  Patient:  THARUN, CAPPELLA  Account Number:  [id number] Admit date:  02/04/2014  Clinical Social Worker:  Carrington Clamp, LCSWA  Date/time:  02/08/2014 08:53 AM  Clinical Social Work is seeking post-discharge placement for this patient at the following level of care:   [place]   (*CSW will update this form in Epic as items are completed)   02/07/2014  Patient/family provided with [place] Department of Clinical Social Work's list of facilities offering this level of care within the geographic area requested by the patient (or if unable, by the patient's family).  02/07/2014  Patient/family informed of their freedom to choose among providers that offer the needed level of care, that participate in Medicare, Medicaid or managed care program needed by the patient, have an available bed and are willing to accept the patient.  02/07/2014  Patient/family informed of [facility]' ownership interest in [facility], as well as of the fact that they are under no obligation to receive care at this facility.  PASARR submitted to EDS on Existing PASARR number received on Existing  FL2 transmitted to all facilities in geographic area requested by pt/family on  02/08/2014 FL2 transmitted to all facilities within larger geographic area on 02/08/2014  Patient informed that his/her managed care company has contracts with or will negotiate with  certain facilities, including the following:     Patient/family informed of bed offers received:   Patient chooses bed at  Physician recommends and patient chooses bed at    Patient to be transferred to  on   Patient to be transferred to facility by  Patient and family notified of transfer on  Name of family member notified:    The following physician request were entered in Epic:   Additional Comments:  [place], [place] Weekend  Clinical Social Worker [phone]

## 2014-02-08 NOTE — Progress Notes (Addendum)
      [facility]  Subjective  - No new complaints.   Objective 127/55 80 99.6 F (37.6 C) (Oral) 18 100%  Intake/Output Summary (Last 24 hours) at 02/08/14 0846 Last data filed at 02/08/14 0746  Gross per 24 hour  Intake    480 ml  Output   1300 ml  Net   -820 ml   Left AKA incision clean and dry. Stump warm, skin soft.    Assessment/Planning: POD #2  Left AKA Removed drains and applied clean dry dressing F/U in our office in 4 weeks for staple removal.  Laurence Slate [facility] 02/08/2014 8:46 AM --  Laboratory Lab Results:  Recent Labs  02/07/14 0315 02/08/14 0615  WBC 11.4* 11.5*  HGB 7.4* 8.7*  HCT 23.1* 26.0*  PLT 357 330   BMET  Recent Labs  02/07/14 0315 02/08/14 0615  NA 135* 138  K 4.1 3.9  CL 103 106  CO2 20 21  GLUCOSE 103* 95  BUN 9 12  CREATININE 0.83 0.69  CALCIUM 8.2* 8.3*    COAG Lab Results  Component Value Date   INR 1.38 02/06/2014   INR 1.28 01/24/2014   INR 1.25 04/02/2013   No results found for: PTT    I have examined the patient, reviewed and agree with above.  Jenina Moening, MD 02/08/2014 9:59 AM

## 2014-02-08 NOTE — Plan of Care (Signed)
Problem: Phase II Progression Outcomes Goal: Vital signs remain stable Outcome: Completed/Met Date Met:  02/08/14

## 2014-02-08 NOTE — Progress Notes (Signed)
[facility]                                                                              Patient Demographics  Rodney [name], is a 68 y.o. male, DOB - 1945/05/28, AQT:[id number]  Admit date - 02/04/2014   Admitting Physician Shanda Howells, MD  Outpatient Primary MD for the patient is [name], NP  LOS - 4   Chief Complaint  Patient presents with  . Toe Injury      HPI on 02/04/2014 This is a 68 y.o. year old male with significant past medical history of severe peripheral vascular disease s/p R AKA, chronic systolic heart failure, HTN, hx/o ETOH abuse presenting with L foot osteomyelitis. Pt noted to have been recently admitted for R foot osteomyelitis 10/22-10/27. Pt refused multiple times L AKA for treatment, which was recommendation by vascular surgery. Pt expressed understanding of consequences of surgical treatment and was discharged on extended course of oral abx (levaquin and doxy). Pt states that he has been compliant with regimen. Still with worsening redness, pain, discomfort per pt. States that he is now ready for amputation. " Can y'all do it tonight?" Vital signs stable. No labs, IV abx started, or imaging obtained prior to being asked for admission. Per EDP. Spoke with on call vascular surgery, who will preop pt.    Assessment & Plan   Left foot osteomyelitis -Continue antibiotics, vancomycin and Zosyn -Blood cultures show not growth to date -ESR 124, CRP 14.3 (both elevated) -left foot xray: aggressive bony loss involving proximal phalanx of the great toe consistent with ongoing bony infection. -vascular surgery consultation appreciated, patient had left AKA POD2 -PT recommended SNF, will consult social work for placement -Continue pain control  -Drain removed today by vasc surgery, patient is to followup in the office in 4 weeks for staple removal  Ischemic cardiomyopathy with chronic systolic heart failure -patient appears to be euvolemic at this  time -cardiology was consulted and following and recommended lasix 5m daily -Last echocardiogram in 01/2014 showed EF 20-25% -continue to monitor daily weights, intake and output -Continue statin, aspirin, coreg  Hypertension -Continue Coreg  Alcohol abuse -denies recent use -level <11 -continue thiamine  Tobacco abuse -smoking cessation counseling given  Normocytic Anemia -Hemoglobin baseline around 9 -Patient was given 1uPRBCs on 11/7 -Hb 8.7 today -Will continue to monitor CBC  Code Status: full  Family Communication: none at bedside  Disposition Plan: admitted, pending SNF placement  Time Spent in minutes   20 minutes  Procedures  none  Consults   Vascular surgery Cardiology  DVT Prophylaxis  heparin  Lab Results  Component Value Date   PLT 330 02/08/2014    Medications  Scheduled Meds: . sodium chloride   Intravenous Once  . aspirin EC  81 mg Oral q morning - 10a  . atorvastatin  40 mg Oral QPM  . carvedilol  6.25 mg Oral BID WC  . doxycycline  100 mg Oral Q12H  . escitalopram  5 mg Oral Daily  . heparin  5,000 Units Subcutaneous 3 times per day  . [MAR Hold] Influenza vac split quadrivalent PF  0.5 mL Intramuscular Tomorrow-1000  . mirtazapine  7.5  mg Oral QHS  . [MAR Hold] pneumococcal 23 valent vaccine  0.5 mL Intramuscular Tomorrow-1000  . sodium chloride  3 mL Intravenous Q12H  . thiamine  200 mg Oral Daily  . vitamin C  500 mg Oral Daily  . zinc sulfate  220 mg Oral Daily   Continuous Infusions: . sodium chloride 10 mL/hr at 02/08/14 0900   PRN Meds:.acetaminophen, HYDROcodone-acetaminophen, morphine injection  Antibiotics    Anti-infectives    Start     Dose/Rate Route Frequency Ordered Stop   02/06/14 1115  doxycycline (VIBRAMYCIN) capsule 100 mg  Status:  Discontinued     100 mg Oral 2 times daily 02/06/14 1103 02/06/14 1108   02/06/14 1115  levofloxacin (LEVAQUIN) tablet 500 mg  Status:  Discontinued     500 mg Oral Daily  02/06/14 1103 02/06/14 1116   02/06/14 1115  doxycycline (VIBRA-TABS) tablet 100 mg     100 mg Oral Every 12 hours 02/06/14 1109     02/05/14 2200  vancomycin (VANCOCIN) IVPB 1000 mg/200 mL premix  Status:  Discontinued     1,000 mg200 mL/hr over 60 Minutes Intravenous Every 24 hours 02/04/14 2025 02/06/14 1103   02/05/14 0300  piperacillin-tazobactam (ZOSYN) IVPB 3.375 g  Status:  Discontinued     3.375 g12.5 mL/hr over 240 Minutes Intravenous Every 8 hours 02/04/14 2025 02/06/14 1116   02/04/14 1900  piperacillin-tazobactam (ZOSYN) IVPB 3.375 g     3.375 g100 mL/hr over 30 Minutes Intravenous  Once 02/04/14 1855 02/04/14 2149   02/04/14 1900  vancomycin (VANCOCIN) IVPB 1000 mg/200 mL premix     1,000 mg200 mL/hr over 60 Minutes Intravenous  Once 02/04/14 1856 02/04/14 2322      Subjective:   [name] seen and examined today.  Patient has no complaints this morning.  Currently sitting up in bed and eating breakfast.  Patient denies SOB, CP, palpitations, abdominal pain.  Objective:   Filed Vitals:   02/07/14 2239 02/08/14 0500 02/08/14 0651 02/08/14 0902  BP: 127/56  127/55 110/58  Pulse: 79  80   Temp: 99.9 F (37.7 C)  99.6 F (37.6 C)   TempSrc: Oral  Oral   Resp: 18  18   Height:      Weight:  51.8 kg (114 lb 3.2 oz)    SpO2: 98%  100%     Wt Readings from Last 3 Encounters:  02/08/14 51.8 kg (114 lb 3.2 oz)  01/22/14 47.628 kg (105 lb)  01/20/14 47.769 kg (105 lb 5 oz)     Intake/Output Summary (Last 24 hours) at 02/08/14 1032 Last data filed at 02/08/14 0746  Gross per 24 hour  Intake    200 ml  Output   1300 ml  Net  -1100 ml    Exam  General: Well developed, well nourished, NAD, appears stated age  HEENT: NCAT, mucous membranes moist.   Cardiovascular: S1 S2 auscultated, 2/6 SEM. Regular rate and rhythm.  Respiratory: Clear to auscultation bilaterally with equal chest rise  Abdomen: Soft, nontender, nondistended, + bowel sounds  Extremities:  B/L AKA, Right stump wrapped  Neuro: AAOx3, cranial nerves grossly intact.    Data Review   Micro Results Recent Results (from the past 240 hour(s))  Blood culture (routine x 2)     Status: None (Preliminary result)   Collection Time: 02/04/14  7:10 PM  Result Value Ref Range Status   Specimen Description BLOOD LEFT ARM  Final   Special Requests BOTTLES DRAWN AEROBIC  AND ANAEROBIC 5CC  Final   Culture  Setup Time   Final    02/05/2014 00:43 Performed at [facility]    Culture   Final           BLOOD CULTURE RECEIVED NO GROWTH TO DATE CULTURE WILL BE HELD FOR 5 DAYS BEFORE ISSUING A FINAL NEGATIVE REPORT Performed at [facility]    Report Status PENDING  Incomplete  Blood culture (routine x 2)     Status: None (Preliminary result)   Collection Time: 02/04/14  7:10 PM  Result Value Ref Range Status   Specimen Description BLOOD ARM RIGHT  Final   Special Requests BOTTLES DRAWN AEROBIC AND ANAEROBIC 10CC  Final   Culture  Setup Time   Final    02/05/2014 00:43 Performed at [facility]    Culture   Final           BLOOD CULTURE RECEIVED NO GROWTH TO DATE CULTURE WILL BE HELD FOR 5 DAYS BEFORE ISSUING A FINAL NEGATIVE REPORT Performed at [facility]    Report Status PENDING  Incomplete  Surgical pcr screen     Status: None   Collection Time: 02/05/14  1:15 AM  Result Value Ref Range Status   MRSA, PCR NEGATIVE NEGATIVE Final   Staphylococcus aureus NEGATIVE NEGATIVE Final    Comment:        The Xpert SA Assay (FDA approved for NASAL specimens in patients over 49 years of age), is one component of a comprehensive surveillance program.  Test performance has been validated by [employer] for patients greater than or equal to 71 year old. It is not intended to diagnose infection nor to guide or monitor treatment.     Radiology Reports Dg Chest 2 View  01/24/2014   CLINICAL DATA:  COPD  EXAM: CHEST  2 VIEW  COMPARISON:  03/29/2013  and 03/27/2013  FINDINGS: Cardiomegaly appears stable. Mediastinal and hilar contours are normal and stable. Pulmonary vascularity is normal. Normal lung volumes. Streaky atelectasis is seen posteriorly at the left lung base. No airspace disease appreciated. Negative for pneumothorax. No evidence of pleural effusion, but the posterior costophrenic angles are partially excluded from the lateral view. No acute osseous abnormality.  IMPRESSION: Cardiomegaly streaky atelectasis at the left lung base.   Electronically Signed   By: Curlene Dolphin M.D.   On: 01/24/2014 17:11   Dg Foot Complete Left  02/04/2014   CLINICAL DATA:  Subsequent encounter for 3-4 week history of left foot and infection  EXAM: LEFT FOOT - COMPLETE 3+ VIEW  COMPARISON:  01/20/2014.  FINDINGS: Bones are diffusely demineralized. There is been further ostial lipase is involving the proximal phalanx of the great toe, consistent with progressing osteomyelitis. Overlying soft tissue gas is again noted compatible with associated soft tissue infection. Chronic posttraumatic deformity is seen in the distal tibia and fibula.  IMPRESSION: Progressive bony loss involving proximal phalanx of the great toe consistent with ongoing bony infection.   Electronically Signed   By: Misty Stanley M.D.   On: 02/04/2014 19:36   Dg Foot Complete Left  01/20/2014   CLINICAL DATA:  Left foot pain, infection. Necrosis of the great toe.  EXAM: LEFT FOOT - COMPLETE 3+ VIEW  COMPARISON:  MRI of left foot 08/14/2013. Plain films of the left great toe 08/13/2013.  FINDINGS: Soft tissues of the foot and great toe are severely swollen. There is a wound projecting along the medial aspect of the  great toe at the level of the IP joint. There has been progressive bony destructive change in the distal phalanx and head of the proximal phalanx of the great toe. All bones are markedly osteopenic. No fracture is identified.  IMPRESSION: Changes consistent with progressive osteomyelitis  of the great toe where an open soft tissue wound is identified.   Electronically Signed   By: Inge Rise M.D.   On: 01/20/2014 18:31    CBC  Recent Labs Lab 02/04/14 1853 02/05/14 0525 02/06/14 0420 02/07/14 0315 02/08/14 0615  WBC 13.3* 10.8* 10.3 11.4* 11.5*  HGB 9.9* 8.4* 8.3* 7.4* 8.7*  HCT 30.9* 26.5* 25.6* 23.1* 26.0*  PLT 412* 386 377 357 330  MCV 82.0 82.0 82.6 81.3 82.5  MCH 26.3 26.0 26.8 26.1 27.6  MCHC 32.0 31.7 32.4 32.0 33.5  RDW 14.6 14.7 14.7 14.6 15.3  LYMPHSABS 2.4 1.6 1.4 1.7 2.4  MONOABS 1.0 1.0 1.2* 1.0 1.1*  EOSABS 0.3 0.3 0.5 0.4 0.5  BASOSABS 0.1 0.1 0.0 0.0 0.1    Chemistries   Recent Labs Lab 02/04/14 1853 02/05/14 0525 02/06/14 0420 02/07/14 0315 02/08/14 0615  NA 137 134* 137 135* 138  K 4.1 4.1 3.8 4.1 3.9  CL 100 101 104 103 106  CO2 22 19 18* 20 21  GLUCOSE 92 89 91 103* 95  BUN 15 15 12 9 12   CREATININE 0.90 0.94 0.95 0.83 0.69  CALCIUM 9.9 8.9 8.9 8.2* 8.3*  AST  --  14 16 17 15   ALT  --  10 11 9 10   ALKPHOS  --  70 73 64 65  BILITOT  --  0.3 0.3 <0.2* <0.2*   ------------------------------------------------------------------------------------------------------------------ estimated creatinine clearance is 63.9 mL/min (by C-G formula based on Cr of 0.69). ------------------------------------------------------------------------------------------------------------------ No results for input(s): HGBA1C in the last 72 hours. ------------------------------------------------------------------------------------------------------------------ No results for input(s): CHOL, HDL, LDLCALC, TRIG, CHOLHDL, LDLDIRECT in the last 72 hours. ------------------------------------------------------------------------------------------------------------------ No results for input(s): TSH, T4TOTAL, T3FREE, THYROIDAB in the last 72 hours.  Invalid input(s):  FREET3 ------------------------------------------------------------------------------------------------------------------ No results for input(s): VITAMINB12, FOLATE, FERRITIN, TIBC, IRON, RETICCTPCT in the last 72 hours.  Coagulation profile  Recent Labs Lab 02/06/14 0420  INR 1.38    No results for input(s): DDIMER in the last 72 hours.  Cardiac Enzymes No results for input(s): CKMB, TROPONINI, MYOGLOBIN in the last 168 hours.  Invalid input(s): CK ------------------------------------------------------------------------------------------------------------------ Invalid input(s): POCBNP    Anelle Parlow D.O. on 02/08/2014 at 10:32 AM  Between 7am to 7pm - Pager - [phone]  After 7pm go to [email or web address] - password [email or web address]  And look for the night coverage person covering for me after hours  [facility] Office  [phone]

## 2014-02-08 NOTE — Progress Notes (Signed)
Primary cardiologist: Dr. Loralie Champagne  Seen for followup: Perioperative cardiac management  Subjective:    Sitting up in bed. Reports good appetite. No shortness of breath or chest pain.  Objective:   Temp:  [98.5 F (36.9 C)-99.9 F (37.7 C)] 99.6 F (37.6 C) (11/08 0651) Pulse Rate:  [71-80] 80 (11/08 0651) Resp:  [16-20] 18 (11/08 0651) BP: (107-131)/(50-56) 127/55 mmHg (11/08 0651) SpO2:  [98 %-100 %] 100 % (11/08 0651) Weight:  [114 lb 3.2 oz (51.8 kg)] 114 lb 3.2 oz (51.8 kg) (11/08 0500) Last BM Date: 02/07/14  Filed Weights   02/06/14 0515 02/07/14 0332 02/08/14 0500  Weight: 125 lb 10.6 oz (57 kg) 121 lb 4.1 oz (55 kg) 114 lb 3.2 oz (51.8 kg)    Intake/Output Summary (Last 24 hours) at 02/08/14 0756 Last data filed at 02/08/14 0746  Gross per 24 hour  Intake    480 ml  Output   1300 ml  Net   -820 ml    Telemetry: Sinus rhythm.  Exam:  General: Chronically ill-appearing, no distress.  Lungs: Clear, nonlabored.  Cardiac: RRR, 2/6 systolic murmur without gallop.  Extremities: Leg dressed, status post left above-the-knee amputation.   Lab Results:  Basic Metabolic Panel:  Recent Labs Lab 02/06/14 0420 02/07/14 0315 02/08/14 0615  NA 137 135* 138  K 3.8 4.1 3.9  CL 104 103 106  CO2 18* 20 21  GLUCOSE 91 103* 95  BUN 12 9 12   CREATININE 0.95 0.83 0.69  CALCIUM 8.9 8.2* 8.3*    Liver Function Tests:  Recent Labs Lab 02/06/14 0420 02/07/14 0315 02/08/14 0615  AST 16 17 15   ALT 11 9 10   ALKPHOS 73 64 65  BILITOT 0.3 <0.2* <0.2*  PROT 7.5 6.3 6.3  ALBUMIN 2.5* 2.0* 2.0*    CBC:  Recent Labs Lab 02/06/14 0420 02/07/14 0315 02/08/14 0615  WBC 10.3 11.4* 11.5*  HGB 8.3* 7.4* 8.7*  HCT 25.6* 23.1* 26.0*  MCV 82.6 81.3 82.5  PLT 377 357 330    Echocardiogram (01/24/2014): Study Conclusions  - Left ventricle: The cavity size was normal. Systolic function was severely reduced. The estimated ejection fraction was in  the range of 20% to 25%. Severe diffuse hypokinesis. Although no diagnostic regional wall motion abnormality was identified, this possibility cannot be completely excluded on the basis of this study. - Aortic valve: There was moderate regurgitation. Valve area (VTI): 1.38 cm^2. Valve area (Vmax): 1.54 cm^2. Valve area (Vmean): 1.49 cm^2. - Mitral valve: There was mild to moderate regurgitation. - Left atrium: The atrium was mildly dilated.   Medications:   Scheduled Medications: . sodium chloride   Intravenous Once  . aspirin EC  81 mg Oral q morning - 10a  . atorvastatin  40 mg Oral QPM  . carvedilol  6.25 mg Oral BID WC  . doxycycline  100 mg Oral Q12H  . escitalopram  5 mg Oral Daily  . furosemide  20 mg Oral Daily  . heparin  5,000 Units Subcutaneous 3 times per day  . [MAR Hold] Influenza vac split quadrivalent PF  0.5 mL Intramuscular Tomorrow-1000  . mirtazapine  7.5 mg Oral QHS  . [MAR Hold] pneumococcal 23 valent vaccine  0.5 mL Intramuscular Tomorrow-1000  . sodium chloride  3 mL Intravenous Q12H  . thiamine  200 mg Oral Daily  . vitamin C  500 mg Oral Daily  . zinc sulfate  220 mg Oral Daily    Infusions: .  sodium chloride 75 mL/hr at 02/08/14 0651    PRN Medications: acetaminophen, furosemide, HYDROcodone-acetaminophen, morphine injection   Assessment:   1. POD# 2 status post left above-the-knee amputation.  2. Ischemic cardiomyopathy, recent LVEF 20-25% with plan for medical therapy and unfavorable revascularization options. Patient tolerated surgery above without obvious adverse cardiac event. He is not reporting any chest pain. Intake and output more even.   Plan/Discussion:    Continues on aspirin, Coreg, Lipitor, subcutaneous heparin. Not on ACE inhibitor or ARB with relatively low blood pressures. Use Lasix 20 mg PRN for now. Follow intake and output.   Satira Sark, M.D., F.A.C.C.

## 2014-02-09 DIAGNOSIS — I2510 Atherosclerotic heart disease of native coronary artery without angina pectoris: Secondary | ICD-10-CM

## 2014-02-09 DIAGNOSIS — I255 Ischemic cardiomyopathy: Secondary | ICD-10-CM

## 2014-02-09 DIAGNOSIS — I5022 Chronic systolic (congestive) heart failure: Secondary | ICD-10-CM

## 2014-02-09 HISTORY — DX: Ischemic cardiomyopathy: I25.5

## 2014-02-09 LAB — CBC WITH DIFFERENTIAL/PLATELET
BASOS PCT: 1 % (ref 0–1)
Basophils Absolute: 0.1 10*3/uL (ref 0.0–0.1)
Eosinophils Absolute: 0.5 10*3/uL (ref 0.0–0.7)
Eosinophils Relative: 4 % (ref 0–5)
HCT: 26.9 % — ABNORMAL LOW (ref 39.0–52.0)
Hemoglobin: 8.9 g/dL — ABNORMAL LOW (ref 13.0–17.0)
LYMPHS ABS: 2.8 10*3/uL (ref 0.7–4.0)
Lymphocytes Relative: 23 % (ref 12–46)
MCH: 27.3 pg (ref 26.0–34.0)
MCHC: 33.1 g/dL (ref 30.0–36.0)
MCV: 82.5 fL (ref 78.0–100.0)
MONO ABS: 1.2 10*3/uL — AB (ref 0.1–1.0)
MONOS PCT: 10 % (ref 3–12)
NEUTROS ABS: 7.6 10*3/uL (ref 1.7–7.7)
Neutrophils Relative %: 62 % (ref 43–77)
PLATELETS: 392 10*3/uL (ref 150–400)
RBC: 3.26 MIL/uL — AB (ref 4.22–5.81)
RDW: 15.5 % (ref 11.5–15.5)
WBC: 12.2 10*3/uL — ABNORMAL HIGH (ref 4.0–10.5)

## 2014-02-09 LAB — COMPREHENSIVE METABOLIC PANEL
ALBUMIN: 2.1 g/dL — AB (ref 3.5–5.2)
ALT: 10 U/L (ref 0–53)
AST: 15 U/L (ref 0–37)
Alkaline Phosphatase: 67 U/L (ref 39–117)
Anion gap: 14 (ref 5–15)
BUN: 13 mg/dL (ref 6–23)
CALCIUM: 8.8 mg/dL (ref 8.4–10.5)
CHLORIDE: 105 meq/L (ref 96–112)
CO2: 21 meq/L (ref 19–32)
Creatinine, Ser: 0.81 mg/dL (ref 0.50–1.35)
GFR calc Af Amer: >90 mL/min (ref >90)
GFR, EST NON AFRICAN AMERICAN: 89 mL/min — AB (ref >90)
Glucose, Bld: 93 mg/dL (ref 70–99)
Potassium: 3.8 mEq/L (ref 3.7–5.3)
SODIUM: 140 meq/L (ref 137–147)
Total Bilirubin: <0.2 mg/dL — ABNORMAL LOW (ref 0.3–1.2)
Total Protein: 6.7 g/dL (ref 6.0–8.3)

## 2014-02-09 MED ORDER — DOXYCYCLINE HYCLATE 100 MG PO TABS: 100.0000 mg | ORAL_TABLET | Freq: Two times a day (BID) | ORAL | Status: DC

## 2014-02-09 MED ORDER — HYDROCODONE-ACETAMINOPHEN 5-325 MG PO TABS: 1.0000 | ORAL_TABLET | Freq: Four times a day (QID) | ORAL | Status: DC | PRN

## 2014-02-09 MED ORDER — FUROSEMIDE 20 MG PO TABS: 20.0000 mg | ORAL_TABLET | Freq: Every day | ORAL | Status: DC | PRN

## 2014-02-09 NOTE — Plan of Care (Signed)
Problem: Phase II Progression Outcomes Goal: Progress activity as tolerated unless otherwise ordered Outcome: Progressing     

## 2014-02-09 NOTE — Progress Notes (Signed)
CARE MANAGEMENT NOTE 02/09/2014  Patient:  Rodney [name], Rodney [name]   Account Number:  [id number]  Date Initiated:  02/09/2014  Documentation initiated by:  [facility]  Subjective/Objective Assessment:   Left foot osteomyelitis status post AKA     Action/Plan:   Anticipated DC Date:  02/09/2014   Anticipated DC Plan:  SKILLED NURSING FACILITY  In-house referral  Clinical Social Worker      DC Planning Services  CM consult      Choice offered to / List presented to:             Status of service:  Completed, signed off Medicare Important Message given?  YES (If response is "NO", the following Medicare IM given date fields will be blank) Date Medicare IM given:  02/09/2014 Medicare IM given by:  Tomi Bamberger Date Additional Medicare IM given:   Additional Medicare IM given by:    Discharge Disposition:  [place]  Per UR Regulation:    If discussed at Long Length of Stay Meetings, dates discussed:    Comments:  Plan dc to SNF. Jonnie Finner RN CCM Case Mgmt phone [phone]

## 2014-02-09 NOTE — Progress Notes (Signed)
OT Cancellation Note  Patient Details Name: Rodney [name] MRN: [id number] DOB: 1945/05/18   Cancelled Treatment:    Reason Eval/Treat Not Completed: Other (comment) Pt is Medicare and current D/C plan is SNF. No apparent immediate acute care OT needs, therefore will defer OT to SNF. If OT eval is needed please call Acute Rehab Dept. at [phone] or text page OT at [phone].  [place], OTR/L  [phone] 02/09/2014 02/09/2014, 8:32 AM

## 2014-02-09 NOTE — Discharge Instructions (Signed)
Bone and Joint Infections °Joint infections are called septic or infectious arthritis. An infected joint may damage cartilage and tissue very quickly. This may destroy the joint. Bone infections (osteomyelitis) may last for years. Joints may become stiff if left untreated. Bacteria are the most common cause. Other causes include viruses and fungi, but these are more rare. Bone and joint infections usually come from injury or infection elsewhere in your body; the germs are carried to your bones or joints through the bloodstream.  °CAUSES  °· Blood-carried germs from an infection elsewhere in your body can eventually spread to a bone or joint. The germ staphylococcus is the most common cause of both osteomyelitis and septic arthritis. °· An injury can introduce germs into your bones or joints. °SYMPTOMS  °· Weight loss. °· Tiredness. °· Chills and fever. °· Bone or joint pain at rest and with activity. °· Tenderness when touching the area or bending the joint. °· Refusal to bear weight on a leg or inability to use an arm due to pain. °· Decreased range of motion in a joint. °· Skin redness, warmth, and tenderness. °· Open skin sores and drainage. °RISK FACTORS °Children, the elderly, and those with weak immune systems are at increased risk of bone and joint infections. It is more common in people with HIV infections and with people on chemotherapy. People are also at increased risk if they have surgery where metal implants are used to stabilize the bone. Plates, screws, or artificial joints provide a surface that bacteria can stick on. Such a growth of bacteria is called biofilm. The biofilm protects bacteria from antibiotics and bodily defenses. This allows germs to multiply. Other reasons for increased risks include:  °· Having previous surgery or injury of a bone or joint. °· Being on high-dose corticosteroids and immunosuppressive medications that weaken your body's resistance to germs. °· Diabetes and  long-standing diseases. °· Use of intravenous street drugs. °· Being on hemodialysis. °· Having a history of urinary tract infections. °· Removal of your spleen (splenectomy). This weakens your immunity. °· Chronic viral infections such as HIV or AIDS. °· Lack of sensation such as paraplegia, quadriplegia, or spina bifida. °DIAGNOSIS  °· Increased numbers of white blood cells in your blood may indicate infection. Some times your caregivers are able to identify the infecting germs by testing your blood. Inflammatory markers present in your bloodstream such as an erythrocyte sedimentation rate (ESR or sed rate) or c-reactive protein (CRP) can be indicators of deep infection. °· Bone scans and X-ray exams are necessary for diagnosing osteomyelitis. They may help your caregiver find the infected areas. Other studies may give more detailed information. They may help detect fluid collections around a joint, abnormal bone surfaces, or be useful in diagnosing septic arthritis. They can find soft tissue swelling and find excess fluid in an infected joint or the adjacent bone. These tests include: °¨ Ultrasound. °¨ CT (computerized tomography). °¨ MRI (magnetic resonance imaging). °· The best test for diagnosing a bone or joint infection is an aspiration or biopsy. Your caregiver will usually use a local anesthetic. He or she can then remove tissue from a bone injury or use a needle to take fluids from an infected joint. A local anesthetic medication numbs the area to be biopsied. Often biopsies are done in the operating room under general anesthesia. This means you will be asleep during the procedure. Tests performed on these samples can identify an infection. °TREATMENT  °· Treatment can help control long-standing   infections, but infections may come back.  Infections can infect any bone or joint at any age.  Bone and joint infections are rarely fatal.  Bone infection left untreated can become a never-ending infection.  It can spread to other areas of your body. It may eventually cause bone death. Reduced limb or joint function can result. In severe cases, this may require removal of a limb. Spinal osteomyelitis is very dangerous. Untreated, it may damage spinal nerves and cause death.  The most common complication of septic arthritis is osteoarthritis with pain and decreased range of motion of the joint. Some forms of treatment may include:  If the infection is caused by bacteria, it is generally treated with antibiotics. You will likely receive the drugs through a vein (intravenously) for anywhere from 2 to 6 weeks. In some cases, especially with children, oral antibiotics following an initial intravenous dose may be effective. The treatment you receive depends on the:  Type of bacteria.  Location of the infection.  Type of surgery that might be done.  Other health conditions or issues you might have.  Your caregiver may drain soft tissue abscesses or pockets of fluid around infected bones or joints. If you have septic arthritis, your caregiver may use a needle to drain pus from the joint on a daily basis. He or she may use an arthroscope to clean the joint or may need to open the joint surgically to remove damaged tissue and infection. An arthroscope is an instrument like a thin lighted telescope. It can be used to look inside the joint.  Surgery is usually needed if the infection has become long-standing. It may also be needed if there is hardware (such as metal plates, screws, or artificial joints) inside the patient. Sometimes a bone or muscle graft is needed to fill in the open space. This promotes growth of new tissues and better blood flow to the area. PREVENTION   Clean and disinfect wounds quickly to help prevent the start of a bone or joint infection. Get treatment for any infections to prevent spread to a bone or joint.  Do not smoke. Smoking decreases healing rates of bone and predisposes to  infection.  When given medications that suppress your immune system, use them according to your caregiver's instructions. Do not take more than prescribed for your condition.  Take good care of your feet and skin, especially if you have diabetes, decreased sensation or circulation problems. SEEK IMMEDIATE MEDICAL CARE IF:   You cannot bear weight on a leg or use an arm, especially following a minor injury. This can be a sign of bone or joint infection.  You think you may have signs or symptoms of a bone or joint infection. Your chance of getting rid of an infection is better if treated early. Document Released: 03/20/2005 Document Revised: 06/12/2011 Document Reviewed: 02/17/2009 [facility] Patient Information 2015 [place], [place]. This information is not intended to replace advice given to you by your health care provider. Make sure you discuss any questions you have with your health care provider.

## 2014-02-09 NOTE — Progress Notes (Signed)
Patient Name: Rodney [name] Date of Encounter: 02/09/2014     Principal Problem:   Foot osteomyelitis, left Active Problems:   Cardiomyopathy, ischemic   HTN (hypertension)   Alcohol abuse   Chronic systolic CHF (congestive heart failure)   Infection   Smoking hx   CAD (coronary artery disease)    SUBJECTIVE  No chest pain or sob.  Mild left leg pain.  CURRENT MEDS . sodium chloride   Intravenous Once  . aspirin EC  81 mg Oral q morning - 10a  . atorvastatin  40 mg Oral QPM  . carvedilol  6.25 mg Oral BID WC  . doxycycline  100 mg Oral Q12H  . escitalopram  5 mg Oral Daily  . heparin  5,000 Units Subcutaneous 3 times per day  . [MAR Hold] Influenza vac split quadrivalent PF  0.5 mL Intramuscular Tomorrow-1000  . mirtazapine  7.5 mg Oral QHS  . [MAR Hold] pneumococcal 23 valent vaccine  0.5 mL Intramuscular Tomorrow-1000  . sodium chloride  3 mL Intravenous Q12H  . thiamine  200 mg Oral Daily  . vitamin C  500 mg Oral Daily  . zinc sulfate  220 mg Oral Daily    OBJECTIVE  Filed Vitals:   02/08/14 1409 02/08/14 2014 02/09/14 0016 02/09/14 0448  BP: 100/67 119/48 110/49 112/52  Pulse: 77 81 75 76  Temp: 99 F (37.2 C) 100.2 F (37.9 C) 98.2 F (36.8 C) 98.9 F (37.2 C)  TempSrc: Oral Oral Oral Oral  Resp: 18 18 17 16   Height:      Weight:    113 lb 15.7 oz (51.7 kg)  SpO2: 98% 98% 100% 100%    Intake/Output Summary (Last 24 hours) at 02/09/14 0852 Last data filed at 02/09/14 0450  Gross per 24 hour  Intake    480 ml  Output   1200 ml  Net   -720 ml   Filed Weights   02/07/14 0332 02/08/14 0500 02/09/14 0448  Weight: 121 lb 4.1 oz (55 kg) 114 lb 3.2 oz (51.8 kg) 113 lb 15.7 oz (51.7 kg)    PHYSICAL EXAM  General: Pleasant, NAD. Neuro: Alert and oriented X 3. Moves all extremities spontaneously. Psych: Normal affect. HEENT:  Normal  Neck: Supple without bruits or JVD. Lungs:  Resp regular and unlabored, CTA. Heart: RRR no s3, s4, 3/6 SEM  RUSB. Abdomen: Soft, non-tender, non-distended, BS + x 4.  Extremities: No clubbing, cyanosis or edema. Bilat AKA.  Accessory Clinical Findings  CBC  Recent Labs  02/08/14 0615 02/09/14 0510  WBC 11.5* 12.2*  NEUTROABS 7.4 7.6  HGB 8.7* 8.9*  HCT 26.0* 26.9*  MCV 82.5 82.5  PLT 330 258   Basic Metabolic Panel  Recent Labs  02/08/14 0615 02/09/14 0510  NA 138 140  K 3.9 3.8  CL 106 105  CO2 21 21  GLUCOSE 95 93  BUN 12 13  CREATININE 0.69 0.81  CALCIUM 8.3* 8.8   Liver Function Tests  Recent Labs  02/08/14 0615 02/09/14 0510  AST 15 15  ALT 10 10  ALKPHOS 65 67  BILITOT <0.2* <0.2*  PROT 6.3 6.7  ALBUMIN 2.0* 2.1*    TELE  RSR  ASSESSMENT AND PLAN  1.  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  Abx per IM.  2.  ICM/Chronic systolic CHF:  Euvolemic on exam.  Wt stable.  Renal fxn stable.  Cont bb.  No acei/arb secondary to h/o soft BP's.  Uses lasix prn @ home.  3.  CAD:  No c/p.  Cont asa/bb/statin.  4.  HTN:  Stable on bb.  Signed, Murray Hodgkins NP  Patient examined chart reviewed.  3 days post Left AKA.  No cardiac complications.  Euvolemic on exam Follow I/O's PRN diuretic if significantly positive.  CAD stable no angina or chest pain  Jenkins Rouge

## 2014-02-09 NOTE — Progress Notes (Signed)
Rehab Admissions Coordinator Note:  Patient was screened by Rodney [name] for appropriateness for an Inpatient Acute Rehab Consult.  At this time, we are recommending Rodney [name].  Rodney [name] 02/09/2014, 9:09 AM  I can be reached at [phone].

## 2014-02-09 NOTE — Progress Notes (Signed)
Thank you for consult on Rodney [name]. Progress notes and therapy notes reviewed. Patient with history of R-AKA and now with L-AKA. Concur with therapy recommendations for SNF for follow up therapy. Will defer CIR consult for now.

## 2014-02-09 NOTE — Discharge Summary (Addendum)
Physician Discharge Summary  Rodney [name] QJF:[id number] DOB: [birth year] DOA: 02/04/2014  PCP: [name], NP  Admit date: 02/04/2014 Discharge date: 02/09/2014  Time spent: 45 minutes  Recommendations for Outpatient Follow-up:  Patient will be discharged to nursing facility.  He is to continue physicals with occupational therapy as recommended by the facility. Patient should continue taking his medications as prescribed. Patient will need to follow-up with [facility], vascular surgeon within 4 weeks to have staples removed.  Patient should also follow-up with his primary care physician within 1 week of discharge. Patient should continue to follow a heart healthy diet. Patient is to abstain from smoking and alcohol use.  Discharge Diagnoses:  Left foot osteomyelitis status post AKA Ischemic myopathy with chronic systolic heart failure Hypertension Alcohol abuse Tobacco abuse Normocytic anemia  Discharge Condition: stable  Diet recommendation:  Heart healthy  Filed Weights   02/07/14 0332 02/08/14 0500 02/09/14 0448  Weight: 55 kg (121 lb 4.1 oz) 51.8 kg (114 lb 3.2 oz) 51.7 kg (113 lb 15.7 oz)    History of present illness:  on 02/04/2014 This is a 68 y.o. year old male with significant past medical history of severe peripheral vascular disease s/p R AKA, chronic systolic heart failure, HTN, hx/o ETOH abuse presenting with L foot osteomyelitis. Pt noted to have been recently admitted for R foot osteomyelitis 10/22-10/27. Pt refused multiple times L AKA for treatment, which was recommendation by vascular surgery. Pt expressed understanding of consequences of surgical treatment and was discharged on extended course of oral abx (levaquin and doxy). Pt states that he has been compliant with regimen. Still with worsening redness, pain, discomfort per pt. States that he is now ready for amputation. " Can y'all do it tonight?" Vital signs stable. No labs, IV abx started, or imaging obtained  prior to being asked for admission. Per EDP. Spoke with on call vascular surgery, who will preop pt.   Hospital Course:  Left foot osteomyelitis -Continue antibiotics, vancomycin and Zosyn -Blood cultures show not growth to date -ESR 124, CRP 14.3 (both elevated) -left foot xray: aggressive bony loss involving proximal phalanx of the great toe consistent with ongoing bony infection. -vascular surgery consultation appreciated, patient had left AKA POD3 -PT recommended SNF -Continue pain control  -Drain removed today by vasc surgery 02/08/2014, patient is to followup in the office in 4 weeks for staple removal -Continue doxycycline for 4 more days.  Ischemic cardiomyopathy with chronic systolic heart failure -patient appears to be euvolemic at this time -cardiology was consulted and following and recommended lasix 9m daily PRN -Last echocardiogram in 01/2014 showed EF 20-25% -continue to monitor daily weights, intake and output at the nursing facility -Continue statin, aspirin, coreg  Hypertension -Continue Coreg  Alcohol abuse -denies recent use -level <11 -continue thiamine  Tobacco abuse -smoking cessation counseling given  Normocytic Anemia -Hemoglobin baseline around 9 -Patient was given 1uPRBCs on 11/7 -Hb 8.9 today  Procedures: none  Consultations: Vascular surgery Cardiology  Discharge Exam: Filed Vitals:   02/09/14 0448  BP: 112/52  Pulse: 76  Temp: 98.9 F (37.2 C)  Resp: 16   Exam  General: Well developed, well nourished, NAD, appears stated age  HEENT: NCAT, mucous membranes moist.   Cardiovascular: S1 S2 auscultated, 2/6 SEM. Regular rate and rhythm.  Respiratory: Clear to auscultation bilaterally with equal chest rise  Abdomen: Soft, nontender, nondistended, + bowel sounds  Extremities: B/L AKA, Right stump wrapped  Neuro: AAOx3, cranial nerves grossly intact  Discharge Instructions  Discharge Instructions    Discharge  instructions    Complete by:  As directed   Patient will be discharged to nursing facility.  He is to continue physicals with occupational therapy as recommended by the facility. Patient should continue taking his medications as prescribed. Patient will need to follow-up with [facility], vascular surgeon within 4 weeks to have staples removed.  Patient should also follow-up with his primary care physician within 1 week of discharge. Patient should continue to follow a heart healthy diet.            Medication List    STOP taking these medications        doxycycline 100 MG capsule  Commonly known as:  VIBRAMYCIN  Replaced by:  doxycycline 100 MG tablet     levofloxacin 500 MG tablet  Commonly known as:  LEVAQUIN      TAKE these medications        acetaminophen 325 MG tablet  Commonly known as:  TYLENOL  Take 2 tablets (650 mg total) by mouth every 6 (six) hours as needed for mild pain.     aspirin EC 81 MG tablet  Take 81 mg by mouth every morning.     atorvastatin 40 MG tablet  Commonly known as:  LIPITOR  Take 40 mg by mouth every evening.     carvedilol 6.25 MG tablet  Commonly known as:  COREG  Take 1 tablet (6.25 mg total) by mouth 2 (two) times daily with a meal.     doxycycline 100 MG tablet  Commonly known as:  VIBRA-TABS  Take 1 tablet (100 mg total) by mouth every 12 (twelve) hours.     escitalopram 5 MG tablet  Commonly known as:  LEXAPRO  Take 5 mg by mouth daily.     furosemide 20 MG tablet  Commonly known as:  LASIX  Take 1 tablet (20 mg total) by mouth daily as needed.     HYDROcodone-acetaminophen 5-325 MG per tablet  Commonly known as:  NORCO/VICODIN  Take 1 tablet by mouth every 6 (six) hours as needed for moderate pain.     mirtazapine 15 MG tablet  Commonly known as:  REMERON  Take 7.5 mg by mouth at bedtime.     mupirocin ointment 2 %  Commonly known as:  BACTROBAN  Apply 1 application topically 2 (two) times daily.     thiamine 100 MG  tablet  Commonly known as:  VITAMIN B-1  Take 200 mg by mouth daily.     vitamin C 500 MG tablet  Commonly known as:  ASCORBIC ACID  Take 500 mg by mouth daily.     zinc sulfate 220 MG capsule  Take 220 mg by mouth daily.       No Known Allergies Follow-up Information    Follow up with [name], NP. Schedule an appointment as soon as possible for a visit in 1 week.   Specialty:  Nurse Practitioner   Why:  hospital follow-up   Contact information:   [address] [place] [address] [phone]       Follow up with [name], MD In 4 weeks.   Specialty:  Vascular Surgery   Why:  Staple removal and followup   Contact information:   [address] [place] [address] [phone]        The results of significant diagnostics from this hospitalization (including imaging, microbiology, ancillary and laboratory) are listed below for reference.    Significant Diagnostic  Studies: Dg Chest 2 View  01/24/2014   CLINICAL DATA:  COPD  EXAM: CHEST  2 VIEW  COMPARISON:  03/29/2013 and 03/27/2013  FINDINGS: Cardiomegaly appears stable. Mediastinal and hilar contours are normal and stable. Pulmonary vascularity is normal. Normal lung volumes. Streaky atelectasis is seen posteriorly at the left lung base. No airspace disease appreciated. Negative for pneumothorax. No evidence of pleural effusion, but the posterior costophrenic angles are partially excluded from the lateral view. No acute osseous abnormality.  IMPRESSION: Cardiomegaly streaky atelectasis at the left lung base.   Electronically Signed   By: Curlene Dolphin M.D.   On: 01/24/2014 17:11   Dg Foot Complete Left  02/04/2014   CLINICAL DATA:  Subsequent encounter for 3-4 week history of left foot and infection  EXAM: LEFT FOOT - COMPLETE 3+ VIEW  COMPARISON:  01/20/2014.  FINDINGS: Bones are diffusely demineralized. There is been further ostial lipase is involving the proximal phalanx of the great toe, consistent with  progressing osteomyelitis. Overlying soft tissue gas is again noted compatible with associated soft tissue infection. Chronic posttraumatic deformity is seen in the distal tibia and fibula.  IMPRESSION: Progressive bony loss involving proximal phalanx of the great toe consistent with ongoing bony infection.   Electronically Signed   By: Misty Stanley M.D.   On: 02/04/2014 19:36   Dg Foot Complete Left  01/20/2014   CLINICAL DATA:  Left foot pain, infection. Necrosis of the great toe.  EXAM: LEFT FOOT - COMPLETE 3+ VIEW  COMPARISON:  MRI of left foot 08/14/2013. Plain films of the left great toe 08/13/2013.  FINDINGS: Soft tissues of the foot and great toe are severely swollen. There is a wound projecting along the medial aspect of the great toe at the level of the IP joint. There has been progressive bony destructive change in the distal phalanx and head of the proximal phalanx of the great toe. All bones are markedly osteopenic. No fracture is identified.  IMPRESSION: Changes consistent with progressive osteomyelitis of the great toe where an open soft tissue wound is identified.   Electronically Signed   By: Inge Rise M.D.   On: 01/20/2014 18:31    Microbiology: Recent Results (from the past 240 hour(s))  Blood culture (routine x 2)     Status: None (Preliminary result)   Collection Time: 02/04/14  7:10 PM  Result Value Ref Range Status   Specimen Description BLOOD LEFT ARM  Final   Special Requests BOTTLES DRAWN AEROBIC AND ANAEROBIC 5CC  Final   Culture  Setup Time   Final    02/05/2014 00:43 Performed at [facility]    Culture   Final           BLOOD CULTURE RECEIVED NO GROWTH TO DATE CULTURE WILL BE HELD FOR 5 DAYS BEFORE ISSUING A FINAL NEGATIVE REPORT Performed at [facility]    Report Status PENDING  Incomplete  Blood culture (routine x 2)     Status: None (Preliminary result)   Collection Time: 02/04/14  7:10 PM  Result Value Ref Range Status   Specimen  Description BLOOD ARM RIGHT  Final   Special Requests BOTTLES DRAWN AEROBIC AND ANAEROBIC 10CC  Final   Culture  Setup Time   Final    02/05/2014 00:43 Performed at [facility]    Culture   Final           BLOOD CULTURE RECEIVED NO GROWTH TO DATE CULTURE WILL BE HELD FOR 5  DAYS BEFORE ISSUING A FINAL NEGATIVE REPORT Performed at [facility]    Report Status PENDING  Incomplete  Surgical pcr screen     Status: None   Collection Time: 02/05/14  1:15 AM  Result Value Ref Range Status   MRSA, PCR NEGATIVE NEGATIVE Final   Staphylococcus aureus NEGATIVE NEGATIVE Final    Comment:        The Xpert SA Assay (FDA approved for NASAL specimens in patients over 73 years of age), is one component of a comprehensive surveillance program.  Test performance has been validated by [employer] for patients greater than or equal to 18 year old. It is not intended to diagnose infection nor to guide or monitor treatment.      Labs: Basic Metabolic Panel:  Recent Labs Lab 02/05/14 0525 02/06/14 0420 02/07/14 0315 02/08/14 0615 02/09/14 0510  NA 134* 137 135* 138 140  K 4.1 3.8 4.1 3.9 3.8  CL 101 104 103 106 105  CO2 19 18* _0 GLUCOSE 89 91 103* 95 93  BUN _1 CREATININE 0.94 0.95 0.83 0.69 0.81  CALCIUM 8.9 8.9 8.2* 8.3* 8.8   Liver Function Tests:  Recent Labs Lab 02/05/14 0525 02/06/14 0420 02/07/14 0315 02/08/14 0615 02/09/14 0510  AST _2 ALT _3 ALKPHOS 70 73 64 65 67  BILITOT 0.3 0.3 <0.2* <0.2* <0.2*  PROT 7.4 7.5 6.3 6.3 6.7  ALBUMIN 2.5* 2.5* 2.0* 2.0* 2.1*   No results for input(s): LIPASE, AMYLASE in the last 168 hours.  Recent Labs Lab 02/04/14 2125  AMMONIA 21   CBC:  Recent Labs Lab 02/05/14 0525 02/06/14 0420 02/07/14 0315 02/08/14 0615 02/09/14 0510  WBC 10.8* 10.3 11.4* 11.5* 12.2*  NEUTROABS 7.9* 7.2 8.3* 7.4 7.6  HGB 8.4* 8.3* 7.4* 8.7* 8.9*  HCT 26.5* 25.6* 23.1* 26.0* 26.9*   MCV 82.0 82.6 81.3 82.5 82.5  PLT 386 377 357 330 392   Cardiac Enzymes: No results for input(s): CKTOTAL, CKMB, CKMBINDEX, TROPONINI in the last 168 hours. BNP: BNP (last 3 results)  Recent Labs  03/25/13 1240  PROBNP 14339.0*   CBG: No results for input(s): GLUCAP in the last 168 hours.     SignedCristal Ford  [facility] 02/09/2014, 10:58 AM

## 2014-02-09 NOTE — Clinical Social Work Placement (Signed)
Clinical Social Work Department CLINICAL SOCIAL WORK PLACEMENT NOTE 02/08/2014  Patient: Rodney [name], Rodney [name] Account Number: [id number] Admit date: 02/04/2014  Clinical Social Worker: Carrington Clamp, LCSWA Date/time: 02/08/2014 08:53 AM  Clinical Social Work is seeking post-discharge placement for this patient at the following level of care: [place] (*CSW will update this form in Epic as items are completed)   02/07/2014 Patient/family provided with [place] Department of Clinical Social Work's list of facilities offering this level of care within the geographic area requested by the patient (or if unable, by the patient's family).  02/07/2014 Patient/family informed of their freedom to choose among providers that offer the needed level of care, that participate in Medicare, Medicaid or managed care program needed by the patient, have an available bed and are willing to accept the patient.  02/07/2014 Patient/family informed of [facility]' ownership interest in [facility], as well as of the fact that they are under no obligation to receive care at this facility.  PASARR submitted to EDS on Existing PASARR number received on Existing  FL2 transmitted to all facilities in geographic area requested by pt/family on 02/08/2014 FL2 transmitted to all facilities within larger geographic area on 02/08/2014  Patient informed that his/her managed care company has contracts with or will negotiate with certain facilities, including the following:    Patient/family informed of bed offers received:02/09/14  Patient chooses bed at [facility] Physician recommends and patient chooses bed at   Patient to be transferred to [facility] on 02/09/14 Patient to be transferred to facility by Ambulance Patient and family notified of transfer on 02/09/14 Name of family member notified:Patient states that he does not want CSW to contact family.  The following  physician request were entered in Epic:   Additional Comments:   Per MD patient ready for DC to [facility]. RN, patient, patient's family, and facility notified of DC. RN given number for report. DC packet on chart. [facility] transport requested for patient. CSW signing off.    [place], [place] Weekend Clinical Social Worker [phone]

## 2014-02-09 NOTE — Progress Notes (Signed)
Physical Therapy Treatment Patient Details Name: Rodney [name] MRN: [id number] DOB: 03/01/1946 Today's Date: 02/09/2014    History of Present Illness Rodney [name] is a 68 y.o. 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    PT Comments    Pt progressing well, transferred uphill from [employer] to bed with sliding board and min A. Is moving well in bed using UE to scoot. May transfer to SNF today for continued rehab. PT will continue to follow.   Follow Up Recommendations  SNF     Equipment Recommendations  None recommended by PT    Recommendations for Other Services       Precautions / Restrictions Precautions Precautions: Fall Restrictions Weight Bearing Restrictions: Yes RLE Weight Bearing: Weight bearing as tolerated (though prosthesis not here) LLE Weight Bearing: Non weight bearing    Mobility  Bed Mobility Overal bed mobility: Modified Independent Bed Mobility: Sit to Supine       Sit to supine: Modified independent (Device/Increase time)   General bed mobility comments: pt able to scoot himself in a full circle in bed and scoot to head before laying down  Transfers Overall transfer level: Needs assistance Equipment used:  (sliding board) Transfers: Lateral/Scoot Transfers          Lateral/Scoot Transfers: Min assist;With slide board General transfer comment: used slide board to transfer uphill BSC to bed. Pt able to wt-shift to put slideboard under right hip (used pillow case to make sliding easier). Pt able to push with arms while therapist provided min A at hips to keep pt in proper alignment to stay on board all the way onto bed. Pt able to shift wt once on bed for therapist to remove board. Very successful transfer for him.  Ambulation/Gait                 Stairs             Wheelchair Mobility    Modified Rankin (Stroke Patients Only)       Balance Overall balance assessment: Needs assistance Sitting-balance support: Single extremity supported Sitting balance-Leahy Scale: Fair                              Cognition Arousal/Alertness: Awake/alert Behavior During Therapy: WFL for tasks assessed/performed Overall Cognitive Status: Within Functional Limits for tasks assessed                      Exercises Amputee Exercises Quad Sets: AROM;Left;10 reps;Supine Hip Extension: AROM;Left;10 reps;Supine Hip ABduction/ADduction: AROM;Left;10 reps;Supine Straight Leg Raises: AROM;Left;10 reps;Supine    General Comments        Pertinent Vitals/Pain Pain Assessment: Faces Faces Pain Scale: Hurts even more Pain Location: left leg Pain Intervention(s): Limited activity within patient's tolerance    Home Living                      Prior Function            PT Goals (current goals can now be found in the care plan section) Acute Rehab PT Goals Patient Stated Goal: Find a new home, go home. PT Goal Formulation: With patient Time For Goal Achievement: 02/21/14 Potential to Achieve Goals: Good Progress towards PT goals: Progressing toward  goals    Frequency  Min 3X/week    PT Plan Current plan remains appropriate    Co-evaluation             End of Session Equipment Utilized During Treatment: Gait belt Activity Tolerance: Patient tolerated treatment well Patient left: in bed;with bed alarm set;with call bell/phone within reach     Time: 1233-1248 PT Time Calculation (min): 15 min  Charges:  $Therapeutic Activity: 8-22 mins                    G Codes:     Leighton Roach, PT  Acute Rehab Services  [phone]  Leighton Roach 02/09/2014, 1:19 PM

## 2014-02-10 ENCOUNTER — Encounter (HOSPITAL_COMMUNITY): Payer: Self-pay | Admitting: Vascular Surgery

## 2014-02-11 LAB — CULTURE, BLOOD (ROUTINE X 2)
CULTURE: NO GROWTH
CULTURE: NO GROWTH

## 2014-03-02 ENCOUNTER — Encounter: Payer: Self-pay | Admitting: Vascular Surgery

## 2014-03-03 ENCOUNTER — Ambulatory Visit (INDEPENDENT_AMBULATORY_CARE_PROVIDER_SITE_OTHER): Payer: Self-pay | Admitting: Vascular Surgery

## 2014-03-03 ENCOUNTER — Encounter: Payer: Self-pay | Admitting: Vascular Surgery

## 2014-03-03 VITALS — BP 103/66 | HR 70 | Resp 14

## 2014-03-03 DIAGNOSIS — I739 Peripheral vascular disease, unspecified: Secondary | ICD-10-CM

## 2014-03-03 NOTE — Progress Notes (Signed)
Subjective:     Patient ID: Rodney [name], male   DOB: 04/28/1945, 68 y.o.   MRN: [id number]  HPI this 68 year old male patient returns for initial follow-up regarding his left above-knee amputation performed by me on 02/04/2014. Patient had a right above-knee dictation done a few weeks earlier at [facility]. He presented with gangrene and cellulitis in the left foot. He has done well following his amputation. He does occasionally have phantom pain but this is not severe he states.   Review of Systems     Objective:   Physical Exam BP 103/66 mmHg  Pulse 70  Resp 14  Gen. chronically ill-appearing male no apparent stress alert and oriented 3 Left AKA has healed nicely. No evidence of infection or hematoma. Skin staples removed.     Assessment:     Nicely healed left above-knee amputation-skin staples removed    Plan:     Patient currently resident of [place] healthcare facility. Return to see me on when necessary basis

## 2014-03-12 ENCOUNTER — Encounter (HOSPITAL_COMMUNITY): Payer: Self-pay | Admitting: Cardiology

## 2014-10-22 ENCOUNTER — Inpatient Hospital Stay (HOSPITAL_COMMUNITY)
Admission: EM | Admit: 2014-10-22 | Discharge: 2014-11-10 | DRG: 330 | Disposition: A | Discharge status: Skilled Nursing Facility | Payer: Medicare Other | Attending: Internal Medicine | Admitting: Internal Medicine

## 2014-10-22 ENCOUNTER — Emergency Department (HOSPITAL_COMMUNITY): Payer: Medicare Other

## 2014-10-22 ENCOUNTER — Encounter (HOSPITAL_COMMUNITY): Payer: Self-pay | Admitting: Nurse Practitioner

## 2014-10-22 DIAGNOSIS — Z89612 Acquired absence of left leg above knee: Secondary | ICD-10-CM | POA: Diagnosis not present

## 2014-10-22 DIAGNOSIS — D649 Anemia, unspecified: Secondary | ICD-10-CM | POA: Diagnosis present

## 2014-10-22 DIAGNOSIS — I70209 Unspecified atherosclerosis of native arteries of extremities, unspecified extremity: Secondary | ICD-10-CM | POA: Diagnosis present

## 2014-10-22 DIAGNOSIS — I714 Abdominal aortic aneurysm, without rupture, unspecified: Secondary | ICD-10-CM | POA: Diagnosis present

## 2014-10-22 DIAGNOSIS — Z89611 Acquired absence of right leg above knee: Secondary | ICD-10-CM | POA: Diagnosis not present

## 2014-10-22 DIAGNOSIS — Z7982 Long term (current) use of aspirin: Secondary | ICD-10-CM

## 2014-10-22 DIAGNOSIS — K56609 Unspecified intestinal obstruction, unspecified as to partial versus complete obstruction: Secondary | ICD-10-CM | POA: Insufficient documentation

## 2014-10-22 DIAGNOSIS — E876 Hypokalemia: Secondary | ICD-10-CM | POA: Diagnosis not present

## 2014-10-22 DIAGNOSIS — F329 Major depressive disorder, single episode, unspecified: Secondary | ICD-10-CM | POA: Diagnosis present

## 2014-10-22 DIAGNOSIS — K5660 Unspecified intestinal obstruction: Secondary | ICD-10-CM

## 2014-10-22 DIAGNOSIS — F17210 Nicotine dependence, cigarettes, uncomplicated: Secondary | ICD-10-CM | POA: Diagnosis present

## 2014-10-22 DIAGNOSIS — I10 Essential (primary) hypertension: Secondary | ICD-10-CM | POA: Diagnosis present

## 2014-10-22 DIAGNOSIS — I429 Cardiomyopathy, unspecified: Secondary | ICD-10-CM | POA: Diagnosis present

## 2014-10-22 DIAGNOSIS — K567 Ileus, unspecified: Secondary | ICD-10-CM | POA: Diagnosis not present

## 2014-10-22 DIAGNOSIS — E440 Moderate protein-calorie malnutrition: Secondary | ICD-10-CM | POA: Diagnosis present

## 2014-10-22 DIAGNOSIS — C786 Secondary malignant neoplasm of retroperitoneum and peritoneum: Secondary | ICD-10-CM | POA: Diagnosis present

## 2014-10-22 DIAGNOSIS — C7889 Secondary malignant neoplasm of other digestive organs: Secondary | ICD-10-CM | POA: Diagnosis present

## 2014-10-22 DIAGNOSIS — D500 Iron deficiency anemia secondary to blood loss (chronic): Secondary | ICD-10-CM | POA: Diagnosis not present

## 2014-10-22 DIAGNOSIS — K593 Megacolon, not elsewhere classified: Secondary | ICD-10-CM | POA: Diagnosis present

## 2014-10-22 DIAGNOSIS — R1031 Right lower quadrant pain: Secondary | ICD-10-CM | POA: Diagnosis present

## 2014-10-22 DIAGNOSIS — C787 Secondary malignant neoplasm of liver and intrahepatic bile duct: Secondary | ICD-10-CM | POA: Diagnosis present

## 2014-10-22 DIAGNOSIS — N390 Urinary tract infection, site not specified: Secondary | ICD-10-CM | POA: Diagnosis present

## 2014-10-22 DIAGNOSIS — D62 Acute posthemorrhagic anemia: Secondary | ICD-10-CM | POA: Diagnosis present

## 2014-10-22 DIAGNOSIS — Z79899 Other long term (current) drug therapy: Secondary | ICD-10-CM

## 2014-10-22 DIAGNOSIS — I2510 Atherosclerotic heart disease of native coronary artery without angina pectoris: Secondary | ICD-10-CM | POA: Diagnosis present

## 2014-10-22 DIAGNOSIS — K6389 Other specified diseases of intestine: Secondary | ICD-10-CM | POA: Diagnosis present

## 2014-10-22 DIAGNOSIS — E785 Hyperlipidemia, unspecified: Secondary | ICD-10-CM | POA: Diagnosis present

## 2014-10-22 DIAGNOSIS — B9620 Unspecified Escherichia coli [E. coli] as the cause of diseases classified elsewhere: Secondary | ICD-10-CM | POA: Diagnosis present

## 2014-10-22 DIAGNOSIS — I5022 Chronic systolic (congestive) heart failure: Secondary | ICD-10-CM | POA: Diagnosis present

## 2014-10-22 DIAGNOSIS — D509 Iron deficiency anemia, unspecified: Secondary | ICD-10-CM | POA: Diagnosis present

## 2014-10-22 DIAGNOSIS — C189 Malignant neoplasm of colon, unspecified: Secondary | ICD-10-CM | POA: Insufficient documentation

## 2014-10-22 DIAGNOSIS — I351 Nonrheumatic aortic (valve) insufficiency: Secondary | ICD-10-CM | POA: Diagnosis present

## 2014-10-22 DIAGNOSIS — C19 Malignant neoplasm of rectosigmoid junction: Secondary | ICD-10-CM | POA: Diagnosis present

## 2014-10-22 DIAGNOSIS — R627 Adult failure to thrive: Secondary | ICD-10-CM | POA: Diagnosis present

## 2014-10-22 DIAGNOSIS — Z01810 Encounter for preprocedural cardiovascular examination: Secondary | ICD-10-CM | POA: Diagnosis not present

## 2014-10-22 DIAGNOSIS — R1903 Right lower quadrant abdominal swelling, mass and lump: Secondary | ICD-10-CM

## 2014-10-22 HISTORY — DX: Abdominal aortic aneurysm, without rupture: I71.4

## 2014-10-22 HISTORY — DX: Sepsis, unspecified organism: A41.9

## 2014-10-22 HISTORY — DX: Atherosclerosis of native arteries of extremities with gangrene, unspecified extremity: I70.269

## 2014-10-22 HISTORY — DX: Osteomyelitis, unspecified: M86.9

## 2014-10-22 HISTORY — DX: Cellulitis of unspecified part of limb: L03.119

## 2014-10-22 HISTORY — DX: Ischemic cardiomyopathy: I25.5

## 2014-10-22 HISTORY — DX: Severe sepsis with septic shock: R65.21

## 2014-10-22 HISTORY — DX: Acute systolic (congestive) heart failure: I50.21

## 2014-10-22 LAB — COMPREHENSIVE METABOLIC PANEL
ALT: 16 U/L — ABNORMAL LOW (ref 17–63)
AST: 20 U/L (ref 15–41)
Albumin: 3.4 g/dL — ABNORMAL LOW (ref 3.5–5.0)
Alkaline Phosphatase: 157 U/L — ABNORMAL HIGH (ref 38–126)
Anion gap: 10 (ref 5–15)
BUN: 21 mg/dL — ABNORMAL HIGH (ref 6–20)
CHLORIDE: 106 mmol/L (ref 101–111)
CO2: 20 mmol/L — ABNORMAL LOW (ref 22–32)
Calcium: 9.3 mg/dL (ref 8.9–10.3)
Creatinine, Ser: 0.73 mg/dL (ref 0.61–1.24)
Glucose, Bld: 96 mg/dL (ref 65–99)
Potassium: 3.4 mmol/L — ABNORMAL LOW (ref 3.5–5.1)
SODIUM: 136 mmol/L (ref 135–145)
Total Bilirubin: 0.5 mg/dL (ref 0.3–1.2)
Total Protein: 8.6 g/dL — ABNORMAL HIGH (ref 6.5–8.1)

## 2014-10-22 LAB — URINALYSIS, ROUTINE W REFLEX MICROSCOPIC
Bilirubin Urine: NEGATIVE
Glucose, UA: NEGATIVE mg/dL
KETONES UR: 15 mg/dL — AB
NITRITE: NEGATIVE
PH: 6 (ref 5.0–8.0)
Protein, ur: 30 mg/dL — AB
Specific Gravity, Urine: 1.024 (ref 1.005–1.030)
UROBILINOGEN UA: 0.2 mg/dL (ref 0.0–1.0)

## 2014-10-22 LAB — CBC
HEMATOCRIT: 29.9 % — AB (ref 39.0–52.0)
Hemoglobin: 9.7 g/dL — ABNORMAL LOW (ref 13.0–17.0)
MCH: 25.1 pg — ABNORMAL LOW (ref 26.0–34.0)
MCHC: 32.4 g/dL (ref 30.0–36.0)
MCV: 77.5 fL — ABNORMAL LOW (ref 78.0–100.0)
Platelets: 416 10*3/uL — ABNORMAL HIGH (ref 150–400)
RBC: 3.86 MIL/uL — ABNORMAL LOW (ref 4.22–5.81)
RDW: 16.8 % — AB (ref 11.5–15.5)
WBC: 10.2 10*3/uL (ref 4.0–10.5)

## 2014-10-22 LAB — URINE MICROSCOPIC-ADD ON

## 2014-10-22 LAB — LIPASE, BLOOD: LIPASE: 30 U/L (ref 22–51)

## 2014-10-22 MED ORDER — DEXTROSE 5 % IV SOLN
1.0000 g | INTRAVENOUS | Status: DC
  Administered 2014-10-23 – 2014-10-27 (×5): 1 g via INTRAVENOUS
  Filled 2014-10-22 (×5): qty 10

## 2014-10-22 MED ORDER — FUROSEMIDE 20 MG PO TABS
20.0000 mg | ORAL_TABLET | Freq: Every day | ORAL | Status: DC | PRN
  Filled 2014-10-22: qty 1

## 2014-10-22 MED ORDER — ESCITALOPRAM OXALATE 5 MG PO TABS
5.0000 mg | ORAL_TABLET | Freq: Every day | ORAL | Status: DC
  Administered 2014-10-24 – 2014-11-10 (×16): 5 mg via ORAL
  Filled 2014-10-22 (×19): qty 1

## 2014-10-22 MED ORDER — IOHEXOL 300 MG/ML  SOLN
100.0000 mL | Freq: Once | INTRAMUSCULAR | Status: AC | PRN
  Administered 2014-10-22: 100 mL via INTRAVENOUS

## 2014-10-22 MED ORDER — POTASSIUM CHLORIDE CRYS ER 20 MEQ PO TBCR
30.0000 meq | EXTENDED_RELEASE_TABLET | Freq: Once | ORAL | Status: AC
  Administered 2014-10-22: 30 meq via ORAL
  Filled 2014-10-22: qty 1

## 2014-10-22 MED ORDER — ONDANSETRON HCL 4 MG/2ML IJ SOLN
4.0000 mg | Freq: Once | INTRAMUSCULAR | Status: AC
  Administered 2014-10-22: 4 mg via INTRAVENOUS
  Filled 2014-10-22: qty 2

## 2014-10-22 MED ORDER — CARVEDILOL 6.25 MG PO TABS
6.2500 mg | ORAL_TABLET | Freq: Two times a day (BID) | ORAL | Status: DC
  Administered 2014-10-23 – 2014-11-06 (×27): 6.25 mg via ORAL
  Filled 2014-10-22 (×34): qty 1

## 2014-10-22 MED ORDER — ACETAMINOPHEN 650 MG RE SUPP: 650.0000 mg | Freq: Four times a day (QID) | RECTAL | Status: DC | PRN

## 2014-10-22 MED ORDER — SODIUM CHLORIDE 0.9 % IV SOLN: 250.0000 mL | INTRAVENOUS | Status: DC | PRN

## 2014-10-22 MED ORDER — MIRTAZAPINE 30 MG PO TABS
30.0000 mg | ORAL_TABLET | Freq: Every day | ORAL | Status: DC
  Administered 2014-10-22 – 2014-11-09 (×18): 30 mg via ORAL
  Filled 2014-10-22 (×21): qty 1

## 2014-10-22 MED ORDER — DICYCLOMINE HCL 20 MG PO TABS
20.0000 mg | ORAL_TABLET | Freq: Every day | ORAL | Status: DC
  Administered 2014-10-22 – 2014-11-09 (×18): 20 mg via ORAL
  Filled 2014-10-22 (×20): qty 1

## 2014-10-22 MED ORDER — IOHEXOL 300 MG/ML  SOLN
50.0000 mL | Freq: Once | INTRAMUSCULAR | Status: AC | PRN
  Administered 2014-10-22: 25 mL via ORAL

## 2014-10-22 MED ORDER — SODIUM CHLORIDE 0.9 % IJ SOLN: 3.0000 mL | INTRAMUSCULAR | Status: DC | PRN

## 2014-10-22 MED ORDER — SODIUM CHLORIDE 0.9 % IJ SOLN
3.0000 mL | Freq: Two times a day (BID) | INTRAMUSCULAR | Status: DC
  Administered 2014-10-22 – 2014-10-29 (×14): 3 mL via INTRAVENOUS

## 2014-10-22 MED ORDER — ONDANSETRON HCL 4 MG/2ML IJ SOLN: 4.0000 mg | Freq: Once | INTRAMUSCULAR | Status: AC | PRN

## 2014-10-22 MED ORDER — OXYCODONE HCL 5 MG PO TABS
5.0000 mg | ORAL_TABLET | ORAL | Status: DC | PRN
  Administered 2014-10-22 – 2014-11-10 (×22): 5 mg via ORAL
  Filled 2014-10-22 (×24): qty 1

## 2014-10-22 MED ORDER — MORPHINE SULFATE 4 MG/ML IJ SOLN
4.0000 mg | Freq: Once | INTRAMUSCULAR | Status: AC
  Administered 2014-10-22: 4 mg via INTRAVENOUS
  Filled 2014-10-22: qty 1

## 2014-10-22 MED ORDER — ACETAMINOPHEN 325 MG PO TABS: 650.0000 mg | ORAL_TABLET | Freq: Four times a day (QID) | ORAL | Status: DC | PRN

## 2014-10-22 MED ORDER — MORPHINE SULFATE 2 MG/ML IJ SOLN
2.0000 mg | INTRAMUSCULAR | Status: DC | PRN
  Administered 2014-10-22 – 2014-11-01 (×8): 2 mg via INTRAVENOUS
  Filled 2014-10-22 (×8): qty 1

## 2014-10-22 MED ORDER — ONDANSETRON HCL 4 MG PO TABS: 4.0000 mg | ORAL_TABLET | Freq: Four times a day (QID) | ORAL | Status: DC | PRN

## 2014-10-22 MED ORDER — DEXTROSE 5 % IV SOLN
1.0000 g | Freq: Once | INTRAVENOUS | Status: AC
  Administered 2014-10-22: 1 g via INTRAVENOUS
  Filled 2014-10-22: qty 10

## 2014-10-22 MED ORDER — VITAMIN C 500 MG PO TABS
500.0000 mg | ORAL_TABLET | Freq: Every day | ORAL | Status: DC
  Administered 2014-10-24 – 2014-10-28 (×4): 500 mg via ORAL
  Filled 2014-10-22 (×7): qty 1

## 2014-10-22 MED ORDER — FOLIC ACID 1 MG PO TABS
1.0000 mg | ORAL_TABLET | Freq: Every day | ORAL | Status: DC
  Administered 2014-10-22 – 2014-11-10 (×17): 1 mg via ORAL
  Filled 2014-10-22 (×20): qty 1

## 2014-10-22 MED ORDER — ADULT MULTIVITAMIN W/MINERALS CH
1.0000 | ORAL_TABLET | Freq: Every day | ORAL | Status: DC
  Administered 2014-10-22 – 2014-10-28 (×5): 1 via ORAL
  Filled 2014-10-22 (×8): qty 1

## 2014-10-22 MED ORDER — VITAMIN B-1 100 MG PO TABS
100.0000 mg | ORAL_TABLET | Freq: Every day | ORAL | Status: DC
  Administered 2014-10-22 – 2014-11-10 (×17): 100 mg via ORAL
  Filled 2014-10-22 (×20): qty 1

## 2014-10-22 MED ORDER — ONDANSETRON HCL 4 MG/2ML IJ SOLN
4.0000 mg | Freq: Four times a day (QID) | INTRAMUSCULAR | Status: DC | PRN
  Administered 2014-10-29 – 2014-11-09 (×7): 4 mg via INTRAVENOUS
  Filled 2014-10-22 (×7): qty 2

## 2014-10-22 MED ORDER — ATORVASTATIN CALCIUM 40 MG PO TABS
40.0000 mg | ORAL_TABLET | Freq: Every evening | ORAL | Status: DC
  Administered 2014-10-22 – 2014-11-09 (×17): 40 mg via ORAL
  Filled 2014-10-22 (×20): qty 1

## 2014-10-22 MED ORDER — ZINC SULFATE 220 (50 ZN) MG PO CAPS
220.0000 mg | ORAL_CAPSULE | Freq: Every day | ORAL | Status: DC
  Administered 2014-10-24 – 2014-10-28 (×4): 220 mg via ORAL
  Filled 2014-10-22 (×7): qty 1

## 2014-10-22 NOTE — ED Provider Notes (Signed)
CSN: [id number]     Arrival date & time 10/22/14  1625 History   First MD Initiated Contact with Patient 10/22/14 1633     Chief Complaint  Patient presents with  . Abdominal Pain     (Consider location/radiation/quality/duration/timing/severity/associated sxs/prior Treatment) HPI Comments: Rodney [name], 69 y/o male with history of tobacco use, alcohol abuse, HTN, CHF, and CAD presents with RLQ pain that started 3 days ago. The pain began 3 days ago in the periumbilical region and has migrated to his RLQ. He has anorexia and nausea and vomited 2-3 times.no aggravating/alleviating factors. He has never had any abdominal surgeries and still has his appendix. He denies fevers, chills, and night sweats or any other associated symptoms.   Patient is a 69 y.o. male presenting with abdominal pain. The history is provided by the patient.  Abdominal Pain Pain location:  RLQ Pain quality: aching   Pain radiates to:  Does not radiate Pain severity:  Severe Onset quality:  Gradual Duration:  3 days Timing:  Constant Progression:  Worsening Chronicity:  New Context: not awakening from sleep, not diet changes, not eating, not previous surgeries, not recent illness, not recent sexual activity, not retching and not trauma   Relieved by:  Nothing Worsened by:  Nothing tried Ineffective treatments:  None tried Associated symptoms: anorexia, constipation, nausea and vomiting   Risk factors: no alcohol abuse, no aspirin use, not elderly, has not had multiple surgeries, not obese, not pregnant and no recent hospitalization     Past Medical History  Diagnosis Date  . HTN (hypertension)   . Smoking hx 03/25/2013  . Alcohol abuse 03/25/2013  . CHF (congestive heart failure) 03/25/2013  . Peripheral vascular disease    Past Surgical History  Procedure Laterality Date  . Above knee leg amputation Right   . Amputation Right 03/26/2013    Procedure: AMPUTATION ABOVE KNEE;  Surgeon: Rosetta Posner, MD;   Location: [place];  Service: Vascular;  Laterality: Right;  . Amputation Left 02/06/2014    Procedure: LEFT ABOVE KNEE AMPUTATION;  Surgeon: Mal Misty, MD;  Location: [place];  Service: Vascular;  Laterality: Left;  . Left and right heart catheterization with coronary angiogram N/A 04/02/2013    Procedure: LEFT AND RIGHT HEART CATHETERIZATION WITH CORONARY ANGIOGRAM;  Surgeon: Larey Dresser, MD;  Location: [facility] CATH LAB;  Service: Cardiovascular;  Laterality: N/A;  . Lower extremity angiogram Left 08/18/2013    Procedure: LOWER EXTREMITY ANGIOGRAM;  Surgeon: Conrad Springville, MD;  Location: [facility] CATH LAB;  Service: Cardiovascular;  Laterality: Left;   Family History  Problem Relation Age of Onset  . Heart disease      No family history   History  Substance Use Topics  . Smoking status: Current Some Day Smoker    Types: Cigarettes  . Smokeless tobacco: Not on file  . Alcohol Use: No     Comment: everyday    Review of Systems  Gastrointestinal: Positive for nausea, vomiting, abdominal pain, constipation and anorexia.  All other systems reviewed and are negative.     Allergies  Review of patient's allergies indicates no known allergies.  Home Medications   Prior to Admission medications   Medication Sig Start Date End Date Taking? Authorizing Provider  aspirin EC 81 MG tablet Take 81 mg by mouth every morning.    Yes Historical Provider, MD  atorvastatin (LIPITOR) 40 MG tablet Take 40 mg by mouth every evening. 04/07/13  Yes [name], MD  carvedilol (COREG) 6.25 MG tablet Take 1 tablet (6.25 mg total) by mouth 2 (two) times daily with a meal. 04/07/13  Yes [name], MD  dicyclomine (BENTYL) 20 MG tablet Take 20 mg by mouth at bedtime.   Yes Historical Provider, MD  escitalopram (LEXAPRO) 5 MG tablet Take 5 mg by mouth daily. 07/31/13  Yes Historical Provider, MD  mirtazapine (REMERON) 30 MG tablet Take 30 mg by mouth at bedtime.   Yes Historical Provider, MD  vitamin C  (ASCORBIC ACID) 500 MG tablet Take 500 mg by mouth daily.   Yes Historical Provider, MD  zinc sulfate 220 MG capsule Take 220 mg by mouth daily.   Yes Historical Provider, MD  acetaminophen (TYLENOL) 325 MG tablet Take 2 tablets (650 mg total) by mouth every 6 (six) hours as needed for mild pain. 08/22/13   [name], MD  doxycycline (VIBRA-TABS) 100 MG tablet Take 1 tablet (100 mg total) by mouth every 12 (twelve) hours. Patient not taking: Reported on 10/22/2014 02/09/14   Velta Addison Mikhail, DO  furosemide (LASIX) 20 MG tablet Take 1 tablet (20 mg total) by mouth daily as needed. 02/09/14   Maryann Mikhail, DO  HYDROcodone-acetaminophen (NORCO/VICODIN) 5-325 MG per tablet Take 1 tablet by mouth every 6 (six) hours as needed for moderate pain. Patient not taking: Reported on 10/22/2014 02/09/14   Maryann Mikhail, DO   BP 118/55 mmHg  Pulse 89  Temp(Src) 98.6 F (37 C) (Oral)  Resp 16  SpO2 100% Physical Exam  Constitutional: He is oriented to person, place, and time. He appears well-developed and well-nourished. No distress.  Keeping hand on RLQ  HENT:  Head: Normocephalic and atraumatic.  Eyes: Conjunctivae and EOM are normal.  Neck: Normal range of motion.  Cardiovascular: Normal rate and regular rhythm.  Exam reveals no gallop and no friction rub.   No murmur heard. Pulmonary/Chest: Effort normal and breath sounds normal. He has no wheezes. He has no rales. He exhibits no tenderness.  Abdominal: Soft. He exhibits no distension. There is tenderness. There is no rebound and no guarding.  RLQ tenderness to palpation. No other focal tenderness to palpation. No peritoneal signs.   Musculoskeletal: Normal range of motion.  Neurological: He is alert and oriented to person, place, and time.  Speech is goal-oriented. Moves limbs without ataxia.   Skin: Skin is warm and dry.  Psychiatric: He has a normal mood and affect. His behavior is normal.  Nursing note and vitals reviewed.   ED  Course  Procedures (including critical care time) Labs Review Labs Reviewed  COMPREHENSIVE METABOLIC PANEL - Abnormal; Notable for the following:    Potassium 3.4 (*)    CO2 20 (*)    BUN 21 (*)    Total Protein 8.6 (*)    Albumin 3.4 (*)    ALT 16 (*)    Alkaline Phosphatase 157 (*)    All other components within normal limits  CBC - Abnormal; Notable for the following:    RBC 3.86 (*)    Hemoglobin 9.7 (*)    HCT 29.9 (*)    MCV 77.5 (*)    MCH 25.1 (*)    RDW 16.8 (*)    Platelets 416 (*)    All other components within normal limits  URINALYSIS, ROUTINE W REFLEX MICROSCOPIC (NOT AT [facility]) - Abnormal; Notable for the following:    APPearance CLOUDY (*)    Hgb urine dipstick TRACE (*)    Ketones, ur 15 (*)  Protein, ur 30 (*)    Leukocytes, UA LARGE (*)    All other components within normal limits  URINE MICROSCOPIC-ADD ON - Abnormal; Notable for the following:    Bacteria, UA MANY (*)    All other components within normal limits  URINE CULTURE  LIPASE, BLOOD    Imaging Review Ct Abdomen Pelvis W Contrast  10/22/2014   CLINICAL DATA:  69 year old male with right lower quadrant and periumbilical pain. Emesis  EXAM: CT ABDOMEN AND PELVIS WITH CONTRAST  TECHNIQUE: Multidetector CT imaging of the abdomen and pelvis was performed using the standard protocol following bolus administration of intravenous contrast.  CONTRAST:  60mL OMNIPAQUE IOHEXOL 300 MG/ML SOLN, 161mL OMNIPAQUE IOHEXOL 300 MG/ML SOLN  COMPARISON:  CT dated 08/21/2013  FINDINGS: Partially visualized small right pleural profusion. Top-normal cardiac size. Right cardiophrenic angle top-normal lymph nodes. No intra-abdominal free air. Small ascites.  There are innumerable hepatic hypodense lesions, increased from prior study. Some lesion such as the 3.8 x 4.5 cm hypodense lesion in the right lobe the liver adjacent the right hepatic vein likely represents a hemangioma and others were previously characterized as cysts.  Other lesions such as a 2.4 x 3.4 cm subcapsular lesion seen in the right hepatic lobe posteriorly (series 2, image 19) are not well characterized but suspicious for metastatic disease. There are nodular implants predominantly involving the surface of the right lobe of the liver with scalloping of the liver parenchyma compatible with subcapsular implants.  The gallbladder is not visualized, likely surgically absent. The pancreas is unremarkable. Surgical clips noted adjacent the head of the pancreas. Multiple hypodense lesions noted predominantly involving the surface of the spleen concerning for metastatic implant. Left adrenal thickening/hyperplasia. There are bilateral renal cortical irregularity is. There is apparent diffuse thickening of the bladder wall which may be partly related to underdistention. Cystitis is not excluded. Correlation with urinalysis recommended. The prostate and seminal vesicles are grossly unremarkable.  There is an ill-defined irregular nodular mass measuring approximately 3.5 x 2.3 cm involving the distal transverse colon most compatible with malignancy. There is high-grade stenosis of the bowel with dilatation of the colon proximal to this mass. There is no evidence of small bowel obstruction. Scattered sigmoid diverticula without active inflammation.  There is aortoiliac atherosclerotic disease. A 2.6 cm infrarenal abdominal aortic ectasia. 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  There are nodular implants in the left lower abdomen with a combined dimension 1.9 x 4.7 cm. There is a 3.7 x 2.0 cm soft tissue implant is noted in the right lower quadrant adjacent the appendix. Multiple nodular implants are also noted adjacent to  the tip of the appendix. Two 2 adjacent implants and free fluid extending from the subhepatic area. However, No definite evidence of acute appendicitis noted.  Multiple nodular densities anterior and inferior to the spleen compatible with metastatic implants.  Retroperitoneal/right periaortic adenopathy measures 1.3 cm in short axis.  Right anterior upper thigh lipoma noted with there is degenerative changes of the spine. No acute fracture.  IMPRESSION: Distal transverse colon obstructing mass most compatible with malignancy. Further evaluation with colonoscopy and tissue sampling recommended. There is high-grade stenosis and dilatation of the colon proximal to this mass.  Peritoneal, mesenteric, and hepatic metastatic  disease.  No definite CT evidence of acute appendicitis.   Electronically Signed   By: Anner Crete M.D.   On: 10/22/2014 19:59     EKG Interpretation None      MDM   Final diagnoses:  Intestinal obstruction, unspecified intestinal obstruction type  Abdominal mass, RLQ (right lower quadrant)  UTI (lower urinary tract infection)    5:57 PM Patient's labs unremarkable for acute changes. Urinalysis pending. CT abdomen pelvis pending. Vitals stable and patient afebrile.   9:01 PM Patient has a RLQ mass that is obstructing his bowel. Patient also has a UTI. Patient will be admitted to hospitalist and will also be seen by General surgery.    Alvina Chou, PA-C 10/22/14 2102  Quintella Reichert, MD 10/23/14 1500

## 2014-10-22 NOTE — Consult Note (Signed)
Re:   Rodney [name] DOB:   01-24-1946 MRN:   [id number]   [facility] Consultation  ASSESSMENT AND PLAN: 1.  Obstructing left transverse colon cancer  Difficult case.  I'm not too sure how much insight the patient has into his problem.      I tried to call family, but phone numbers are wrong or no one answers.  Will have to decide what to do in the light of day.  Dr. Excell Seltzer is our surgeon of the week and will see the patient tomorrow.  2.  Probable metastatic disease.  3.  Bilateral amputee  Left AKA - 02/06/2014 - Dr. Kellie Simmering  Right AKA - 03/26/2013 - T. Early  4. HTN  5.  Smokes - 1/2 ppd  6.  Chronic systolic heart failure  Chief Complaint  Patient presents with  . Abdominal Pain   REFERRING PHYSICIAN: HATCHETT, MARY, NP  HISTORY OF PRESENT ILLNESS: Rodney [name] is a 69 y.o. (DOB: 07-15-1945)  AA  male whose primary care physician is [name], NP.  Comes with 3 days history of periumbilical pain, which migrated to his RLQ.  He has vomited several times.  He has no prior abdominal surgery.  He says that he used to drink a lot and has had a "bad stomach" for some time.  He is not a good historian.  Other than drinking, no significant GI history.  CT scan of abdomen/pelvis - 10/22/2014 - Distal transverse colon obstructing mass most compatible with malignancy. Further evaluation with colonoscopy and tissue sampling recommended. There is high-grade stenosis and dilatation of the colon proximal to this mass.  Peritoneal, mesenteric, and hepatic metastatic disease.    Past Medical History  Diagnosis Date  . HTN (hypertension)   . Smoking hx 03/25/2013  . Alcohol abuse 03/25/2013  . CHF (congestive heart failure) 03/25/2013  . Peripheral vascular disease       Past Surgical History  Procedure Laterality Date  . Above knee leg amputation Right   . Amputation Right 03/26/2013    Procedure: AMPUTATION ABOVE KNEE;  Surgeon: Rosetta Posner, MD;  Location: [place];  Service:  Vascular;  Laterality: Right;  . Amputation Left 02/06/2014    Procedure: LEFT ABOVE KNEE AMPUTATION;  Surgeon: Mal Misty, MD;  Location: [place];  Service: Vascular;  Laterality: Left;  . Left and right heart catheterization with coronary angiogram N/A 04/02/2013    Procedure: LEFT AND RIGHT HEART CATHETERIZATION WITH CORONARY ANGIOGRAM;  Surgeon: Larey Dresser, MD;  Location: [facility] CATH LAB;  Service: Cardiovascular;  Laterality: N/A;  . Lower extremity angiogram Left 08/18/2013    Procedure: LOWER EXTREMITY ANGIOGRAM;  Surgeon: Conrad Quinhagak, MD;  Location: [facility] CATH LAB;  Service: Cardiovascular;  Laterality: Left;      Current Facility-Administered Medications  Medication Dose Route Frequency Provider Last Rate Last Dose  . cefTRIAXone (ROCEPHIN) 1 g in dextrose 5 % 50 mL IVPB  1 g Intravenous Once [employer], PA-C      . ondansetron (ZOFRAN) injection 4 mg  4 mg Intravenous Once PRN Lajean Saver, MD       Current Outpatient Prescriptions  Medication Sig Dispense Refill  . aspirin EC 81 MG tablet Take 81 mg by mouth every morning.     Marland Kitchen atorvastatin (LIPITOR) 40 MG tablet Take 40 mg by mouth every evening.    . carvedilol (COREG) 6.25 MG tablet Take 1 tablet (6.25 mg total) by mouth 2 (two) times daily with a  meal.    . dicyclomine (BENTYL) 20 MG tablet Take 20 mg by mouth at bedtime.    Marland Kitchen escitalopram (LEXAPRO) 5 MG tablet Take 5 mg by mouth daily.    . mirtazapine (REMERON) 30 MG tablet Take 30 mg by mouth at bedtime.    . vitamin C (ASCORBIC ACID) 500 MG tablet Take 500 mg by mouth daily.    Marland Kitchen zinc sulfate 220 MG capsule Take 220 mg by mouth daily.    Marland Kitchen acetaminophen (TYLENOL) 325 MG tablet Take 2 tablets (650 mg total) by mouth every 6 (six) hours as needed for mild pain.    Marland Kitchen doxycycline (VIBRA-TABS) 100 MG tablet Take 1 tablet (100 mg total) by mouth every 12 (twelve) hours. (Patient not taking: Reported on 10/22/2014) 8 tablet 0  . furosemide (LASIX) 20 MG tablet Take 1  tablet (20 mg total) by mouth daily as needed. 30 tablet 0  . HYDROcodone-acetaminophen (NORCO/VICODIN) 5-325 MG per tablet Take 1 tablet by mouth every 6 (six) hours as needed for moderate pain. (Patient not taking: Reported on 10/22/2014) 20 tablet 0     No Known Allergies  REVIEW OF SYSTEMS: Skin:  No history of rash.  No history of abnormal moles. Infection:  No history of hepatitis or HIV.  No history of MRSA. Neurologic:  No history of stroke.  No history of seizure.  No history of headaches. Cardiac:  HTN.  Chronic systolic heart failure Pulmonary:  Smokes  Endocrine:  No diabetes. No thyroid disease. Gastrointestinal:  See HPI Urologic:  No history of kidney stones.  No history of bladder infections. Musculoskeletal:  Bilateral AKA Hematologic:  No bleeding disorder.  No history of anemia.  Not anticoagulated. Psycho-social:  The patient is oriented.   SOCIAL and FAMILY HISTORY: Unmarried. No children. Has a cousin - Edison Simon - the phone number in demographics is disconnected. There is a Jim Like, identified as aunt, but no one answers the phone and there is no answering machine.  PHYSICAL EXAM: BP 111/59 mmHg  Pulse 78  Temp(Src) 98.1 F (36.7 C) (Oral)  Resp 14  SpO2 98%  General: Older AA who is alert.  HEENT: Normal. Pupils equal. Neck: Supple. No mass.  No thyroid mass. Lymph Nodes:  No supraclavicular or cervical nodes. Lungs: Clear to auscultation and symmetric breath sounds. Heart:  RRR. No murmur or rub. Abdomen: Soft.  No hernia. Normal bowel sounds.  No abdominal scars. Rectal: Not done.  Mild distention.  No localized tenderness. Extremities:  Bilateral AKA. Neurologic:  Grossly intact to motor and sensory function. Psychiatric: Behavior is normal.   DATA REVIEWED: Epic notes  Alphonsa Overall, MD,  [facility], [address]  [place], [place]    [place] Phone:  [place]:   [phone]

## 2014-10-22 NOTE — ED Notes (Signed)
Pt is still unable to give urine sample at this time

## 2014-10-22 NOTE — Clinical Social Work Note (Signed)
Clinical Social Work Assessment  Patient Details  Name: Rodney [name] MRN: [id number] Date of Birth: 1945-10-01  Date of referral:  10/22/14               Reason for consult:   (Patient is from [facility]                Permission sought to share information with:   (None.) Permission granted to share information::  No  Name::        Agency::     Relationship::     Contact Information:     Housing/Transportation Living arrangements for the past 2 months:  [place] of Information:  Patient Patient Interpreter Needed:  None Criminal Activity/Legal Involvement Pertinent to Current Situation/Hospitalization:  No - Comment as needed Significant Relationships:  None Lives with:  Facility Resident Do you feel safe going back to the place where you live?  Yes Need for family participation in patient care:   (Patient informed CSW that he does not have a good support system.)  Care giving concerns:  There are no care giving concerns at this time. Patient confirms that he is from [facility]. He states that he feels safe to return back to the facility upon discharge.   Social Worker assessment / plan:  CSW met with pt at bedside. There was no family present. Patient states that he does not have a support system.   Patient confirms that he is from [facility]. He states that he has been living at the facility for the past year. Patient states that he presents to [facility] due to abdominal pain. Per note, patient states that the pain started 3 days ago. The pt is a bilateral amputee.   Patient states that he receives assistance with completing his ADL's. Patient informed CSW that he uses a wheelchair while at the facility.  Employment status:  Retired Forensic scientist:  Medicare PT Recommendations:  Not assessed at this time Information / Referral to community resources:   (The pt is currently living at a facility.)  Patient/Family's Response to care:  The pt is aware  that he will be admitted. He is accepting at this time. The pt's response to care is appropriate.  Patient/Family's Understanding of and Emotional Response to Diagnosis, Current Treatment, and Prognosis: The pt states that he has no questions for CSW. Patient is understanding at this time.  Emotional Assessment Appearance:  Appears older than stated age Attitude/Demeanor/Rapport:   (Well Mannered. Appropriate. ) Affect (typically observed):  Accepting Orientation:  Oriented to Self, Oriented to  Time, Oriented to Place, Oriented to Situation Alcohol / Substance use:  Not Applicable Psych involvement (Current and /or in the community):  No (Comment) (The pt is not being evaluataed for psych at this time.)  Discharge Needs  Concerns to be addressed:  Adjustment to Illness Readmission within the last 30 days:  No Current discharge risk:  None Barriers to Discharge:  No Barriers Identified   Bernita Buffy, LCSW 10/22/2014, 9:47 PM

## 2014-10-22 NOTE — Progress Notes (Signed)
ANTIBIOTIC CONSULT NOTE - INITIAL  Pharmacy Consult for ceftriaxone Indication: UTI  No Known Allergies  Patient Measurements:   Adjusted Body Weight:   Vital Signs: Temp: 98.1 F (36.7 C) (07/21 1901) Temp Source: Oral (07/21 1901) BP: 111/59 mmHg (07/21 1901) Pulse Rate: 78 (07/21 1901) Intake/Output from previous day:   Intake/Output from this shift:    Labs:  Recent Labs  10/22/14 1707  WBC 10.2  HGB 9.7*  PLT 416*  CREATININE 0.73   CrCl cannot be calculated (Unknown ideal weight.). No results for input(s): VANCOTROUGH, VANCOPEAK, VANCORANDOM, GENTTROUGH, GENTPEAK, GENTRANDOM, TOBRATROUGH, TOBRAPEAK, TOBRARND, AMIKACINPEAK, AMIKACINTROU, AMIKACIN in the last 72 hours.   Microbiology: No results found for this or any previous visit (from the past 720 hour(s)).  Medical History: Past Medical History  Diagnosis Date  . HTN (hypertension)   . Smoking hx 03/25/2013  . Alcohol abuse 03/25/2013  . CHF (congestive heart failure) 03/25/2013  . Peripheral vascular disease    Assessment: 67 YOM presents with abdominal pain.  History of BL AKA. Found to have RLQ mass  obstructing bowel, likely malignant.    7/21 >> ceftriaxone  >>    7/21 urine + pyuria on UA):   No leukocytosis or fever  Goal of Therapy:  Dose per indication and patient specific parameters  Plan:   Ceftriaxone 1gm IV q24h  Ceftriaxone dose not require additional adjustment for organ function  Suggest stop antibiotics if no symptoms of UTI, may represent asymptomatic bacteruria  Doreene Eland, PharmD, BCPS.   Pager: [phone]  10/22/2014,9:15 PM

## 2014-10-22 NOTE — ED Notes (Signed)
Per EMS pt from [facility] c/o periumbilical abdominal pain radiating to RLQ, pt c/o emesis every time he attempts to eat, nausea, and constipation.

## 2014-10-22 NOTE — ED Notes (Signed)
Bed: QJ48 Expected date:  Expected time:  Means of arrival:  Comments: Ems- abd pain

## 2014-10-22 NOTE — H&P (Signed)
[facility] History and Physical  Rodney Yera YIF:[id number] DOB: December 31, 1945 DOA: 10/22/2014  Referring physician: Alvina Chou, PA PCP: [name], NP   Chief Complaint: Abdominal Pain  HPI: Rodney [name] is a 69 y.o. male with multiple medical problems presents with abdominal pain. Patient states that the pain started about 3 days ago and is mainly located in the Right side lower quadrant. He staets that he has had a poor appetite also. He states he has been losing weight. He has had some diarrhea. He admits to nausea and also some vomting. He states he has no blood in his stools. Patient has prior history of vascular disease and has had bilateral amputations. He states that he does smoke. He states that he has a history of ETOH use in the past no drinks in the past 2-3 years. In the ED he had a CT of the abdomen done and this shows presence of a colonic mass likely malignant.   Review of Systems:  12 point ROS performed and is unremarkable other than HPI  Past Medical History  Diagnosis Date  . HTN (hypertension)   . Smoking hx 03/25/2013  . Alcohol abuse 03/25/2013  . CHF (congestive heart failure) 03/25/2013  . Peripheral vascular disease    Past Surgical History  Procedure Laterality Date  . Above knee leg amputation Right   . Amputation Right 03/26/2013    Procedure: AMPUTATION ABOVE KNEE;  Surgeon: Rosetta Posner, MD;  Location: [place];  Service: Vascular;  Laterality: Right;  . Amputation Left 02/06/2014    Procedure: LEFT ABOVE KNEE AMPUTATION;  Surgeon: Mal Misty, MD;  Location: [place];  Service: Vascular;  Laterality: Left;  . Left and right heart catheterization with coronary angiogram N/A 04/02/2013    Procedure: LEFT AND RIGHT HEART CATHETERIZATION WITH CORONARY ANGIOGRAM;  Surgeon: Larey Dresser, MD;  Location: [facility] CATH LAB;  Service: Cardiovascular;  Laterality: N/A;  . Lower extremity angiogram Left 08/18/2013    Procedure: LOWER EXTREMITY ANGIOGRAM;   Surgeon: Conrad [place], MD;  Location: [facility] CATH LAB;  Service: Cardiovascular;  Laterality: Left;   Social History:  reports that he has been smoking Cigarettes.  He does not have any smokeless tobacco history on file. He reports that he uses illicit drugs (Marijuana). He reports that he does not drink alcohol.  No Known Allergies  Family History  Problem Relation Age of Onset  . Heart disease      No family history     Prior to Admission medications   Medication Sig Start Date End Date Taking? Authorizing Provider  aspirin EC 81 MG tablet Take 81 mg by mouth every morning.    Yes Historical Provider, MD  atorvastatin (LIPITOR) 40 MG tablet Take 40 mg by mouth every evening. 04/07/13  Yes [name], MD  carvedilol (COREG) 6.25 MG tablet Take 1 tablet (6.25 mg total) by mouth 2 (two) times daily with a meal. 04/07/13  Yes [name], MD  dicyclomine (BENTYL) 20 MG tablet Take 20 mg by mouth at bedtime.   Yes Historical Provider, MD  escitalopram (LEXAPRO) 5 MG tablet Take 5 mg by mouth daily. 07/31/13  Yes Historical Provider, MD  mirtazapine (REMERON) 30 MG tablet Take 30 mg by mouth at bedtime.   Yes Historical Provider, MD  vitamin C (ASCORBIC ACID) 500 MG tablet Take 500 mg by mouth daily.   Yes Historical Provider, MD  zinc sulfate 220 MG capsule Take 220 mg by mouth  daily.   Yes Historical Provider, MD  acetaminophen (TYLENOL) 325 MG tablet Take 2 tablets (650 mg total) by mouth every 6 (six) hours as needed for mild pain. 08/22/13   [name], MD  doxycycline (VIBRA-TABS) 100 MG tablet Take 1 tablet (100 mg total) by mouth every 12 (twelve) hours. Patient not taking: Reported on 10/22/2014 02/09/14   Velta Addison Mikhail, DO  furosemide (LASIX) 20 MG tablet Take 1 tablet (20 mg total) by mouth daily as needed. 02/09/14   Maryann Mikhail, DO  HYDROcodone-acetaminophen (NORCO/VICODIN) 5-325 MG per tablet Take 1 tablet by mouth every 6 (six) hours as needed for moderate  pain. Patient not taking: Reported on 10/22/2014 02/09/14   Cristal Ford, DO   Physical Exam: Filed Vitals:   10/22/14 1633 10/22/14 1901  BP: 118/55 111/59  Pulse: 89 78  Temp: 98.6 F (37 C) 98.1 F (36.7 C)  TempSrc: Oral Oral  Resp: 16 14  SpO2: 100% 98%    Wt Readings from Last 3 Encounters:  02/09/14 51.7 kg (113 lb 15.7 oz)  01/22/14 47.628 kg (105 lb)  01/20/14 47.769 kg (105 lb 5 oz)    General:  Appears calm and comfortable Eyes: PERRL, normal lids, irises & conjunctiva ENT: grossly normal hearing, lips & tongue Neck: no LAD, masses or thyromegaly Cardiovascular: RRR, no m/r/g. Bilateral amputation Respiratory: CTA bilaterally, no w/r/r.  Abdomen: soft distended RLQ tenderness no reobound Skin: no rash or induration seen on limited exam Musculoskeletal: grossly normal tone BUE/BLE Psychiatric: grossly normal mood and affect Neurologic: grossly non-focal.          Labs on Admission:  Basic Metabolic Panel:  Recent Labs Lab 10/22/14 1707  NA 136  K 3.4*  CL 106  CO2 20*  GLUCOSE 96  BUN 21*  CREATININE 0.73  CALCIUM 9.3   Liver Function Tests:  Recent Labs Lab 10/22/14 1707  AST 20  ALT 16*  ALKPHOS 157*  BILITOT 0.5  PROT 8.6*  ALBUMIN 3.4*    Recent Labs Lab 10/22/14 1707  LIPASE 30   No results for input(s): AMMONIA in the last 168 hours. CBC:  Recent Labs Lab 10/22/14 1707  WBC 10.2  HGB 9.7*  HCT 29.9*  MCV 77.5*  PLT 416*   Cardiac Enzymes: No results for input(s): CKTOTAL, CKMB, CKMBINDEX, TROPONINI in the last 168 hours.  BNP (last 3 results) No results for input(s): BNP in the last 8760 hours.  ProBNP (last 3 results) No results for input(s): PROBNP in the last 8760 hours.  CBG: No results for input(s): GLUCAP in the last 168 hours.  Radiological Exams on Admission: Ct Abdomen Pelvis W Contrast  10/22/2014   CLINICAL DATA:  69 year old male with right lower quadrant and periumbilical pain. Emesis  EXAM:  CT ABDOMEN AND PELVIS WITH CONTRAST  TECHNIQUE: Multidetector CT imaging of the abdomen and pelvis was performed using the standard protocol following bolus administration of intravenous contrast.  CONTRAST:  52mL OMNIPAQUE IOHEXOL 300 MG/ML SOLN, 120mL OMNIPAQUE IOHEXOL 300 MG/ML SOLN  COMPARISON:  CT dated 08/21/2013  FINDINGS: Partially visualized small right pleural profusion. Top-normal cardiac size. Right cardiophrenic angle top-normal lymph nodes. No intra-abdominal free air. Small ascites.  There are innumerable hepatic hypodense lesions, increased from prior study. Some lesion such as the 3.8 x 4.5 cm hypodense lesion in the right lobe the liver adjacent the right hepatic vein likely represents a hemangioma and others were previously characterized as cysts. Other lesions such as a 2.4 x  3.4 cm subcapsular lesion seen in the right hepatic lobe posteriorly (series 2, image 19) are not well characterized but suspicious for metastatic disease. There are nodular implants predominantly involving the surface of the right lobe of the liver with scalloping of the liver parenchyma compatible with subcapsular implants.  The gallbladder is not visualized, likely surgically absent. The pancreas is unremarkable. Surgical clips noted adjacent the head of the pancreas. Multiple hypodense lesions noted predominantly involving the surface of the spleen concerning for metastatic implant. Left adrenal thickening/hyperplasia. There are bilateral renal cortical irregularity is. There is apparent diffuse thickening of the bladder wall which may be partly related to underdistention. Cystitis is not excluded. Correlation with urinalysis recommended. The prostate and seminal vesicles are grossly unremarkable.  There is an ill-defined irregular nodular mass measuring approximately 3.5 x 2.3 cm involving the distal transverse colon most compatible with malignancy. There is high-grade stenosis of the bowel with dilatation of the colon  proximal to this mass. There is no evidence of small bowel obstruction. Scattered sigmoid diverticula without active inflammation.  There is aortoiliac atherosclerotic disease. A 2.6 cm infrarenal abdominal aortic ectasia. 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  There are nodular implants in the left lower abdomen with a combined dimension 1.9 x 4.7 cm. There is a 3.7 x 2.0 cm soft tissue implant is noted in the right lower quadrant adjacent the appendix. Multiple nodular implants are also noted adjacent to the tip of the appendix. Two 2 adjacent implants and free fluid extending from the subhepatic area. However, No definite evidence of acute appendicitis noted.  Multiple nodular densities anterior and inferior to the spleen compatible with metastatic implants.  Retroperitoneal/right periaortic adenopathy measures 1.3 cm in short axis.  Right anterior upper thigh lipoma noted with there is degenerative changes of the spine. No acute fracture.  IMPRESSION: Distal transverse colon obstructing mass most compatible with malignancy. Further evaluation with colonoscopy and tissue sampling recommended. There is high-grade stenosis and dilatation of the colon proximal to this mass.  Peritoneal, mesenteric, and hepatic metastatic disease.  No definite CT evidence of acute appendicitis.   Electronically Signed   By: Anner Crete M.D.   On: 10/22/2014 19:59     Assessment/Plan Principal Problem:   Colonic mass Active Problems:   HTN (hypertension)   CAD (coronary artery disease)   Chronic systolic CHF (congestive heart failure), NYHA class 4   Anemia   Hypokalemia   1. Colonic Mass -will get surgery consult (ED Called) -will need biopsy highly  suspicious for malignant neoplasm -will hold ASA -will need GI consult and Oncology consult  2. HTN -On coreg and lasix prn -will monitor pressures  3. CAD -no chest pain presently -will monitor  4. CHF Class 4 -will continue coreg and lasix as needed  5. Anemia -will check iron b12 folate  6. Hypokalemia -will give potassium now  7. Hyperlipidemia -will continue with statins -check lipid panel  8.  depression -continue with lexapro     Code Status: full code (must indicate code status--if unknown or must be presumed, indicate so) DVT Prophylaxis:SCD Family Communication: none (indicate person spoken with, if applicable, with phone number if by telephone) Disposition Plan: SNF (indicate anticipated LOS)  Time spent: 86min  [facility] A [facility] Pager [phone]

## 2014-10-23 DIAGNOSIS — B9620 Unspecified Escherichia coli [E. coli] as the cause of diseases classified elsewhere: Secondary | ICD-10-CM | POA: Diagnosis present

## 2014-10-23 DIAGNOSIS — Z008 Encounter for other general examination: Secondary | ICD-10-CM

## 2014-10-23 DIAGNOSIS — E440 Moderate protein-calorie malnutrition: Secondary | ICD-10-CM | POA: Diagnosis present

## 2014-10-23 DIAGNOSIS — N390 Urinary tract infection, site not specified: Secondary | ICD-10-CM | POA: Diagnosis present

## 2014-10-23 LAB — COMPREHENSIVE METABOLIC PANEL
ALBUMIN: 3.2 g/dL — AB (ref 3.5–5.0)
ALK PHOS: 142 U/L — AB (ref 38–126)
ALT: 14 U/L — AB (ref 17–63)
ANION GAP: 8 (ref 5–15)
AST: 20 U/L (ref 15–41)
BUN: 18 mg/dL (ref 6–20)
CALCIUM: 8.9 mg/dL (ref 8.9–10.3)
CO2: 20 mmol/L — ABNORMAL LOW (ref 22–32)
CREATININE: 0.76 mg/dL (ref 0.61–1.24)
Chloride: 103 mmol/L (ref 101–111)
GFR calc Af Amer: >60 mL/min (ref >60)
Glucose, Bld: 83 mg/dL (ref 65–99)
Potassium: 3.9 mmol/L (ref 3.5–5.1)
Sodium: 131 mmol/L — ABNORMAL LOW (ref 135–145)
Total Bilirubin: 0.4 mg/dL (ref 0.3–1.2)
Total Protein: 7.9 g/dL (ref 6.5–8.1)

## 2014-10-23 LAB — LIPID PANEL
Cholesterol: 141 mg/dL (ref 0–200)
HDL: 26 mg/dL — AB (ref >40)
LDL Cholesterol: 86 mg/dL (ref 0–99)
Total CHOL/HDL Ratio: 5.4 RATIO
Triglycerides: 143 mg/dL (ref <150)
VLDL: 29 mg/dL (ref 0–40)

## 2014-10-23 LAB — CBC
HEMATOCRIT: 29.9 % — AB (ref 39.0–52.0)
Hemoglobin: 9.4 g/dL — ABNORMAL LOW (ref 13.0–17.0)
MCH: 24.8 pg — AB (ref 26.0–34.0)
MCHC: 31.4 g/dL (ref 30.0–36.0)
MCV: 78.9 fL (ref 78.0–100.0)
Platelets: 392 10*3/uL (ref 150–400)
RBC: 3.79 MIL/uL — ABNORMAL LOW (ref 4.22–5.81)
RDW: 16.8 % — ABNORMAL HIGH (ref 11.5–15.5)
WBC: 8.8 10*3/uL (ref 4.0–10.5)

## 2014-10-23 LAB — IRON AND TIBC
Iron: 29 ug/dL — ABNORMAL LOW (ref 45–182)
SATURATION RATIOS: 18 % (ref 17.9–39.5)
TIBC: 162 ug/dL — ABNORMAL LOW (ref 250–450)
UIBC: 133 ug/dL

## 2014-10-23 LAB — PROTIME-INR
INR: 1.36 (ref 0.00–1.49)
Prothrombin Time: 16.9 seconds — ABNORMAL HIGH (ref 11.6–15.2)

## 2014-10-23 LAB — TSH: TSH: 2.877 u[IU]/mL (ref 0.350–4.500)

## 2014-10-23 LAB — FERRITIN: FERRITIN: 751 ng/mL — AB (ref 24–336)

## 2014-10-23 LAB — GLUCOSE, CAPILLARY: Glucose-Capillary: 98 mg/dL (ref 65–99)

## 2014-10-23 LAB — APTT: APTT: 42 s — AB (ref 24–37)

## 2014-10-23 LAB — SURGICAL PCR SCREEN
MRSA, PCR: NEGATIVE
STAPHYLOCOCCUS AUREUS: NEGATIVE

## 2014-10-23 LAB — VITAMIN B12: Vitamin B-12: 351 pg/mL (ref 180–914)

## 2014-10-23 MED ORDER — ENSURE ENLIVE PO LIQD
237.0000 mL | Freq: Two times a day (BID) | ORAL | Status: DC
  Administered 2014-10-24 – 2014-10-26 (×4): 237 mL via ORAL

## 2014-10-23 MED ORDER — CETYLPYRIDINIUM CHLORIDE 0.05 % MT LIQD
7.0000 mL | Freq: Two times a day (BID) | OROMUCOSAL | Status: DC
  Administered 2014-10-26 – 2014-11-09 (×12): 7 mL via OROMUCOSAL

## 2014-10-23 MED ORDER — SODIUM CHLORIDE 0.9 % IV SOLN
250.0000 mL | INTRAVENOUS | Status: AC | PRN
  Administered 2014-10-23 – 2014-10-25 (×2): 250 mL via INTRAVENOUS

## 2014-10-23 MED ORDER — CHLORHEXIDINE GLUCONATE 0.12 % MT SOLN
15.0000 mL | Freq: Two times a day (BID) | OROMUCOSAL | Status: DC
  Administered 2014-10-23 – 2014-11-10 (×20): 15 mL via OROMUCOSAL
  Filled 2014-10-23 (×39): qty 15

## 2014-10-23 NOTE — Progress Notes (Signed)
Patient is not willing to have surgery.  Surgery made him npo but we shall let him eat, according to Dr. Sanjuana Letters.  We will give him a regular diet so he can eat. Rodney [name]

## 2014-10-23 NOTE — Progress Notes (Signed)
[facility] Progress Note     Subjective: Pt hungry wants to eat.  No N/V today or yesterday per patient.  He said prior to that he was throwing up a lot.  He says "I have to eat I haven't eaten anything in 2 days".  Says he has abdominal pain in his RLQ.  He says he's been having diarrhea.  Unsure if he's passing flatus.  Objective: Vital signs in last 24 hours: Temp:  [97.1 F (36.2 C)-98.6 F (37 C)] 97.1 F (36.2 C) (07/22 0554) Pulse Rate:  [75-89] 75 (07/22 0554) Resp:  [14-16] 16 (07/22 0554) BP: (111-138)/(55-59) 124/58 mmHg (07/22 0554) SpO2:  [98 %-100 %] 99 % (07/22 0554) Weight:  [50.848 kg (112 lb 1.6 oz)] 50.848 kg (112 lb 1.6 oz) (07/22 0445) Last BM Date: 10/21/14  Intake/Output from previous day: 07/21 0701 - 07/22 0700 In: 600 [P.O.:600] Out: 200 [Urine:200] Intake/Output this shift: Total I/O In: -  Out: 175 [Urine:175]  PE: Gen:  Alert, NAD, pleasant Card:  RRR, murmur heard, no G/R Pulm:  CTA, no W/R/R Abd: Firm abdomen, distended, tender in RLQ and LUQ, no abdominal scars noted, hard area in the upper abdomen Ext:  B/L AKA  Psych:  Insight poor, he keeps saying he needs to eat despite me telling and explaining in may ways why he can't eat anything right now.  Mumbles, some times unable to understand him.   Lab Results:   Recent Labs  10/22/14 1707 10/23/14 0345  WBC 10.2 8.8  HGB 9.7* 9.4*  HCT 29.9* 29.9*  PLT 416* 392   BMET  Recent Labs  10/22/14 1707 10/23/14 0345  NA 136 131*  K 3.4* 3.9  CL 106 103  CO2 20* 20*  GLUCOSE 96 83  BUN 21* 18  CREATININE 0.73 0.76  CALCIUM 9.3 8.9   PT/INR  Recent Labs  10/23/14 0345  LABPROT 16.9*  INR 1.36   CMP     Component Value Date/Time   NA 131* 10/23/2014 0345   K 3.9 10/23/2014 0345   CL 103 10/23/2014 0345   CO2 20* 10/23/2014 0345   GLUCOSE 83 10/23/2014 0345   BUN 18 10/23/2014 0345   CREATININE 0.76 10/23/2014 0345   CALCIUM 8.9 10/23/2014 0345   PROT  7.9 10/23/2014 0345   ALBUMIN 3.2* 10/23/2014 0345   AST 20 10/23/2014 0345   ALT 14* 10/23/2014 0345   ALKPHOS 142* 10/23/2014 0345   BILITOT 0.4 10/23/2014 0345   GFRNONAA >60 10/23/2014 0345   GFRAA >60 10/23/2014 0345   Lipase     Component Value Date/Time   LIPASE 30 10/22/2014 1707       Studies/Results: Ct Abdomen Pelvis W Contrast  10/22/2014   CLINICAL DATA:  69 year old male with right lower quadrant and periumbilical pain. Emesis  EXAM: CT ABDOMEN AND PELVIS WITH CONTRAST  TECHNIQUE: Multidetector CT imaging of the abdomen and pelvis was performed using the standard protocol following bolus administration of intravenous contrast.  CONTRAST:  45mL OMNIPAQUE IOHEXOL 300 MG/ML SOLN, 137mL OMNIPAQUE IOHEXOL 300 MG/ML SOLN  COMPARISON:  CT dated 08/21/2013  FINDINGS: Partially visualized small right pleural profusion. Top-normal cardiac size. Right cardiophrenic angle top-normal lymph nodes. No intra-abdominal free air. Small ascites.  There are innumerable hepatic hypodense lesions, increased from prior study. Some lesion such as the 3.8 x 4.5 cm hypodense lesion in the right lobe the liver adjacent the right hepatic vein likely represents a hemangioma and others were previously  characterized as cysts. Other lesions such as a 2.4 x 3.4 cm subcapsular lesion seen in the right hepatic lobe posteriorly (series 2, image 19) are not well characterized but suspicious for metastatic disease. There are nodular implants predominantly involving the surface of the right lobe of the liver with scalloping of the liver parenchyma compatible with subcapsular implants.  The gallbladder is not visualized, likely surgically absent. The pancreas is unremarkable. Surgical clips noted adjacent the head of the pancreas. Multiple hypodense lesions noted predominantly involving the surface of the spleen concerning for metastatic implant. Left adrenal thickening/hyperplasia. There are bilateral renal cortical  irregularity is. There is apparent diffuse thickening of the bladder wall which may be partly related to underdistention. Cystitis is not excluded. Correlation with urinalysis recommended. The prostate and seminal vesicles are grossly unremarkable.  There is an ill-defined irregular nodular mass measuring approximately 3.5 x 2.3 cm involving the distal transverse colon most compatible with malignancy. There is high-grade stenosis of the bowel with dilatation of the colon proximal to this mass. There is no evidence of small bowel obstruction. Scattered sigmoid diverticula without active inflammation.  There is aortoiliac atherosclerotic disease. A 2.6 cm infrarenal abdominal aortic ectasia. 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  There are nodular implants in the left lower abdomen with a combined dimension 1.9 x 4.7 cm. There is a 3.7 x 2.0 cm soft tissue implant is noted in the right lower quadrant adjacent the appendix. Multiple nodular implants are also noted adjacent to the tip of the appendix. Two 2 adjacent implants and free fluid extending from the subhepatic area. However, No definite evidence of acute appendicitis noted.  Multiple nodular densities anterior and inferior to the spleen compatible with metastatic implants.  Retroperitoneal/right periaortic adenopathy measures 1.3 cm in short axis.  Right anterior upper thigh lipoma noted with there is degenerative changes of the spine. No acute fracture.  IMPRESSION: Distal transverse colon obstructing mass most compatible with malignancy. Further evaluation with colonoscopy and tissue sampling recommended. There is high-grade stenosis and dilatation of the colon proximal to this mass.  Peritoneal,  mesenteric, and hepatic metastatic disease.  No definite CT evidence of acute appendicitis.   Electronically Signed   By: Anner Crete M.D.   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   -Will need surgical intervention, may be palliative loop or end colostomy -NPO, IVF, pain control, antiemetics -I called psych to check capacity for medical decision making, may need legal guardian.  Don't see evidence in the chart of guardianship. -May need ex lap with possible resection vs palliative ostomy given evidence of metastasis.  Likely needs cards clearance.   Bilateral amputee -Left AKA - 02/06/2014 - Dr. Kellie Simmering -Right AKA - 03/26/2013 - T. Early HTN Smokes - 1/2 ppd Chronic systolic heart failure Murmur? - new or old? ?CARDS   I talked to Jim Like (Aunt of patient) and Edison Simon [facility] daughter) on the phone and let them know his likely diagnosis.  Stanton Kidney is [facility] and was unable to talk long. They are available by phone and understand he has a bowel obstruction and likely has metastatic cancer.  His family has not known where he has been over the last 3 months.  Last they knew he was in [facility].  They live in [place].  They will try to come to the hospital tomorrow, unsure if they can come today.  I've updated the numbers in his demographics.  They tell me he has 2 sisters, but he is not in contact with them and they live in [place].     LOS: 1 day    Nat Christen 10/23/2014, 8:06 AM Pager: [phone]

## 2014-10-23 NOTE — Progress Notes (Signed)
[facility] PROGRESS NOTE  Efraim Vanallen YWV:[id number] DOB: [birth year] DOA: 10/22/2014 PCP: [name], NP  Summary I have seen and examined Mr. Lebron Quam at bedside and reviewed his chart. Appreciate general surgery/psychiatrist/GI. Rodney [name] is a 69 y.o. male with multiple medical problems who presents with abdominal pain, and he was found to have UTI/colonic mass concerning for malignancy, with CT abdomen and pelvis showing "Distal transverse colon obstructing mass most compatible with malignancy. Further evaluation with colonoscopy and tissue sampling recommended. There is high-grade stenosis and dilatation of the colon proximal to this mass. Peritoneal, mesenteric, and hepatic metastatic disease. No definite CT evidence of acute appendicitis". Patient has been offered surgical intervention but he refuses and has been deemed to have mental capacity to make decisions regarding his medical care. He understands he may die without acute intervention. He requests food. We'll therefore respect his wishes, start feeds and continued to deliberate with him going forward. 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 Disposition Plan: TBD  Consultants:  Psychiatry  General surgery  GI  Procedures:  CT abdomen pelvis  Antibiotics:  Ceftriaxone 10/22/2014>  HPI/Subjective: No specific complaints  Objective: Filed Vitals:   10/23/14 1318  BP: 110/56  Pulse: 77  Temp: 98.3 F (36.8 C)  Resp: 18    Intake/Output Summary (Last 24 hours) at 10/23/14 1840 Last data filed at 10/23/14 1448  Gross per 24 hour  Intake     600 ml  Output    675 ml  Net    -75 ml   Filed Weights   10/23/14 0445  Weight: 50.848 kg (112 lb 1.6 oz)    Exam:   General:  Comfortable at rest.  Cardiovascular: S1-S2 normal. No murmurs. Pulse regular.  Respiratory: Good air entry bilaterally. No rhonchi or rales.  Abdomen: Soft and nontender. Normal bowel sounds. No organomegaly.  Musculoskeletal: Bilateral AKA   Neurological: Intact  Data Reviewed: Basic Metabolic Panel:  Recent Labs Lab 10/22/14 1707 10/23/14 0345  NA 136 131*  K 3.4* 3.9  CL 106 103  CO2 20* 20*  GLUCOSE 96 83  BUN 21* 18  CREATININE 0.73 0.76  CALCIUM 9.3 8.9   Liver Function Tests:  Recent Labs Lab 10/22/14 1707 10/23/14 0345  AST 20 20  ALT 16* 14*  ALKPHOS 157* 142*  BILITOT 0.5 0.4  PROT 8.6* 7.9  ALBUMIN 3.4* 3.2*    Recent Labs Lab 10/22/14 1707  LIPASE 30   No results for input(s): AMMONIA in the last 168 hours. CBC:  Recent Labs Lab 10/22/14 1707 10/23/14 0345  WBC 10.2 8.8  HGB 9.7* 9.4*  HCT 29.9* 29.9*  MCV 77.5* 78.9  PLT 416* 392   Cardiac Enzymes: No results for input(s): CKTOTAL, CKMB, CKMBINDEX, TROPONINI in the last 168 hours. BNP (last 3 results) No results for input(s): BNP in the last 8760 hours.  ProBNP (last 3 results) No results for input(s): PROBNP in the last 8760 hours.  CBG:  Recent Labs Lab 10/23/14 0751  GLUCAP 98    Recent Results (from the past 240 hour(s))  Surgical pcr screen     Status: None   Collection Time: 10/22/14 11:13 PM  Result Value Ref Range Status  MRSA, PCR NEGATIVE NEGATIVE Final   Staphylococcus aureus NEGATIVE NEGATIVE Final    Comment:        The Xpert SA Assay (FDA approved for NASAL specimens in patients over 70 years of age), is one component of a comprehensive surveillance program.  Test performance has been validated by [facility] for patients greater than or equal to 27 year old. It is not intended to diagnose infection nor  to guide or monitor treatment.      Studies: Ct Abdomen Pelvis W Contrast  10/22/2014   CLINICAL DATA:  69 year old male with right lower quadrant and periumbilical pain. Emesis  EXAM: CT ABDOMEN AND PELVIS WITH CONTRAST  TECHNIQUE: Multidetector CT imaging of the abdomen and pelvis was performed using the standard protocol following bolus administration of intravenous contrast.  CONTRAST:  88mL OMNIPAQUE IOHEXOL 300 MG/ML SOLN, 127mL OMNIPAQUE IOHEXOL 300 MG/ML SOLN  COMPARISON:  CT dated 08/21/2013  FINDINGS: Partially visualized small right pleural profusion. Top-normal cardiac size. Right cardiophrenic angle top-normal lymph nodes. No intra-abdominal free air. Small ascites.  There are innumerable hepatic hypodense lesions, increased from prior study. Some lesion such as the 3.8 x 4.5 cm hypodense lesion in the right lobe the liver adjacent the right hepatic vein likely represents a hemangioma and others were previously characterized as cysts. Other lesions such as a 2.4 x 3.4 cm subcapsular lesion seen in the right hepatic lobe posteriorly (series 2, image 19) are not well characterized but suspicious for metastatic disease. There are nodular implants predominantly involving the surface of the right lobe of the liver with scalloping of the liver parenchyma compatible with subcapsular implants.  The gallbladder is not visualized, likely surgically absent. The pancreas is unremarkable. Surgical clips noted adjacent the head of the pancreas. Multiple hypodense lesions noted predominantly involving the surface of the spleen concerning for metastatic implant. Left adrenal thickening/hyperplasia. There are bilateral renal cortical irregularity is. There is apparent diffuse thickening of the bladder wall which may be partly related to underdistention. Cystitis is not excluded. Correlation with urinalysis recommended. The prostate and seminal vesicles are grossly unremarkable.  There is an ill-defined irregular  nodular mass measuring approximately 3.5 x 2.3 cm involving the distal transverse colon most compatible with malignancy. There is high-grade stenosis of the bowel with dilatation of the colon proximal to this mass. There is no evidence of small bowel obstruction. Scattered sigmoid diverticula without active inflammation.  There is aortoiliac atherosclerotic disease. A 2.6 cm infrarenal abdominal aortic ectasia. 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  There are nodular implants in the left lower abdomen with a combined dimension 1.9 x 4.7 cm. There is a 3.7 x 2.0 cm soft tissue implant is noted in the right lower quadrant adjacent the appendix. Multiple nodular implants are also noted adjacent to the tip of the appendix. Two 2 adjacent implants and free fluid extending from the subhepatic area. However, No definite evidence of acute appendicitis noted.  Multiple nodular densities anterior and inferior to the spleen compatible with metastatic implants.  Retroperitoneal/right periaortic adenopathy measures 1.3 cm in short axis.  Right anterior upper thigh lipoma noted with there is degenerative changes of the spine. No acute fracture.  IMPRESSION: Distal transverse colon obstructing mass most compatible with  malignancy. Further evaluation with colonoscopy and tissue sampling recommended. There is high-grade stenosis and dilatation of the colon proximal to this mass.  Peritoneal, mesenteric, and hepatic metastatic disease.  No definite CT evidence of acute appendicitis.   Electronically Signed   By: Anner Crete M.D.   On: 10/22/2014 19:59    Scheduled Meds: . antiseptic oral rinse  7 mL Mouth Rinse q12n4p  . atorvastatin  40 mg Oral QPM  . carvedilol   6.25 mg Oral BID WC  . cefTRIAXone (ROCEPHIN)  IV  1 g Intravenous Q24H  . chlorhexidine  15 mL Mouth Rinse BID  . dicyclomine  20 mg Oral QHS  . escitalopram  5 mg Oral Daily  . feeding supplement (ENSURE ENLIVE)  237 mL Oral BID BM  . folic acid  1 mg Oral Daily  . mirtazapine  30 mg Oral QHS  . multivitamin with minerals  1 tablet Oral Daily  . sodium chloride  3 mL Intravenous Q12H  . thiamine  100 mg Oral Daily  . vitamin C  500 mg Oral Daily  . zinc sulfate  220 mg Oral Daily   Continuous Infusions:    Time spent: 25 minutes    Joe Tanney  [facility] Pager [phone]. If 7PM-7AM, please contact night-coverage at [email or web address], password [facility] 10/23/2014, 6:40 PM  LOS: 1 day

## 2014-10-23 NOTE — Progress Notes (Signed)
Patient admitted from ED to room 1342 with colonic mass per CT. Admitted for pain control as well. Oriented to room and unit.

## 2014-10-23 NOTE — Clinical Social Work Psych Assess (Signed)
Clinical Social Work Nature conservation officer  Clinical Social Worker:  Boone Master, [place] Date/Time:  10/23/2014, 2:55 PM Referred By:  Physician Date Referred:  10/23/14 Reason for Referral:  Competency/Guardianship   Presenting Symptoms/Problems  Presenting Symptoms/Problems(in person's/family's own words):  Psych consulted to determine if patient has capacity.    Abuse/Neglect/Trauma History  Abuse/Neglect/Trauma History:  Denies History Abuse/Neglect/Trauma History Comments (indicate dates):  N/A   Psychiatric History  Psychiatric History:  Denies History Psychiatric Medication:  Lexapro and Remeron   Current Mental Health Hospitalizations/Previous Mental Health History:  Patient denies any previous MH diagnosis or treatment.   Current Provider:  None reported Place and Date:  N/A  Current Medications:   Scheduled Meds: . antiseptic oral rinse  7 mL Mouth Rinse q12n4p  . atorvastatin  40 mg Oral QPM  . carvedilol  6.25 mg Oral BID WC  . cefTRIAXone (ROCEPHIN)  IV  1 g Intravenous Q24H  . chlorhexidine  15 mL Mouth Rinse BID  . dicyclomine  20 mg Oral QHS  . escitalopram  5 mg Oral Daily  . feeding supplement (ENSURE ENLIVE)  237 mL Oral BID BM  . folic acid  1 mg Oral Daily  . mirtazapine  30 mg Oral QHS  . multivitamin with minerals  1 tablet Oral Daily  . sodium chloride  3 mL Intravenous Q12H  . thiamine  100 mg Oral Daily  . vitamin C  500 mg Oral Daily  . zinc sulfate  220 mg Oral Daily   Continuous Infusions:  PRN Meds:.sodium chloride, acetaminophen **OR** acetaminophen, furosemide, morphine, ondansetron **OR** ondansetron (ZOFRAN) IV, oxyCODONE, sodium chloride     Previous Inpatient Admission/Date/Reason:  None reported   Emotional Health/Current Symptoms  Suicide/Self Harm: None Reported Suicide Attempt in Past (date/description):  Patient denies any SI or HI.  Other Harmful Behavior (ex. homicidal ideation) (describe):  None  reported   Psychotic/Dissociative Symptoms  Psychotic/Dissociative Symptoms: None Reported Other Psychotic/Dissociative Symptoms:  N/A   Attention/Behavioral Symptoms  Attention/Behavioral Symptoms: Inattentive, Restless Other Attention/Behavioral Symptoms:  Patient became guarded and restless throughout assessment.   Cognitive Impairment  Cognitive Impairment:  Within Normal Limits Other Cognitive Impairment:  Patient has good orientation and memory.   Mood and Adjustment  Mood and Adjustment:  Guarded, Labile   Stress, Anxiety, Trauma, Any Recent Loss/Stressor  Stress, Anxiety, Trauma, Any Recent Loss/Stressor: None Reported Anxiety (frequency):  N/A  Phobia (specify):  N/A  Compulsive Behavior (specify):  N/A  Obsessive Behavior (specify):  N/A  Other Stress, Anxiety, Trauma, Any Recent Loss/Stressor:  N/A   Substance Abuse/Use  Substance Abuse/Use: None SBIRT Completed (please refer for detailed history): No Self-reported Substance Use (last use and frequency):  Patient denies any substance use.  Urinary Drug Screen Completed: No Alcohol Level:  N/A   Environment/Housing/Living Arrangement  Environmental/Housing/Living Arrangement: Assisted Living Who is in the Home:  [place]  Emergency Contact:  None reported   Financial  Financial: Medicare, Medicaid   Patient's Strengths and Goals  Patient's Strengths and Goals (patient's own words):  Patient has stable housing at ALF who is agreeable to accept patient back at [place].   Clinical Social Worker's Interpretive Summary  Clinical Social Workers Interpretive Summary:    CSW received referral to complete psychosocial assessment. CSW and psych MD met with patient to complete evaluation.  Patient was living in [place] and working in [employer]. Patient reports about 1 year ago he got frostbite on his feet and legs and went for  surgery. Patient is upset and reports he does not trust doctors  because they were only supposed to amputate his toes but ended up amputating his legs. Patient has never been married and does not have children. Due to lack of support, after amputations, patient was placed at [name] (ALF). CSW completed FL2 and spoke with ALF who is agreeable to accept patient back at [place]. Per ALF, patient is his own guardian and has never had any family visitors to come visit patient or call to check on his status. ALF reports that patient is independent and "stubborn at times." ALF reports that patient will refuse medication or treatment and is hard to persuade at times. ALF is agreeable to accept patient back at [place].  Patient reports that he is aware of diagnosis and that he has been unable to eat due to stomach problems. 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 Patient reports he does not want any further surgery because he will never trust doctors. Patient reports he is agreeable to continue being sick but will not go through surgery.   Patient was agreeable to complete mini mental status exam (MMSE) but became guarded when completing exam. Patient has good orientation and memory. Patient has poor judgement but reports understanding of medical problems.  Psych MD to make final determination on capacity.   Disposition  Disposition: Recommend Psych CSW Continuing To Support While In [facility] [place]

## 2014-10-23 NOTE — Consult Note (Signed)
[place] Psychiatry Consult   Reason for Consult:  Capacity evaluation Referring Physician:  Dr. Melina Modena, PA Patient Identification: Rodney [name] MRN:  [id number] Principal Diagnosis: Colonic mass Diagnosis:   Patient Active Problem List   Diagnosis Date Noted  . Colonic mass [K63.89] 10/22/2014  . Chronic systolic CHF (congestive heart failure), NYHA class 4 [I50.22] 10/22/2014  . Anemia [D64.9] 10/22/2014  . Hypokalemia [E87.6] 10/22/2014  . Cardiomyopathy, ischemic [I25.5] 02/09/2014  . CAD (coronary artery disease) [I25.10] 02/09/2014  . Infection [B99.9]   . Foot osteomyelitis, left [M86.9] 02/04/2014  . Osteomyelitis of left foot [M86.9] 01/22/2014  . Cellulitis of foot [L03.119] 01/22/2014  . Noncompliance [Z91.19] 08/22/2013  . Preop cardiovascular exam [C94.709] 08/19/2013  . Left foot pain [M79.672] 08/14/2013  . Chronic systolic CHF (congestive heart failure) [I50.22] 08/14/2013  . Osteomyelitis of toe of left foot [M86.9] 08/13/2013  . AKI (acute kidney injury) [N17.9] 08/13/2013  . 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 Heme + stool [R19.5] 04/06/2013  . Decreased hemoglobin or hematocrit [D64.9] 04/06/2013  . Acute systolic CHF (congestive heart failure), NYHA class 4 [I50.21] 03/28/2013  . Acute respiratory failure with hypoxia [J96.01] 03/27/2013  . Alcohol withdrawal delirium [F10.231] 03/27/2013  . Septic shock(785.52) [A41.9, R65.21] 03/26/2013  . Severe sepsis(995.92) [A41.9, R65.20] 03/26/2013  . Severe protein-calorie malnutrition [E43] 03/26/2013  . Septic shock [A41.9, R65.21] 03/26/2013  . Open wound of right foot [S91.301A] 03/25/2013  . Ischemic foot [I99.8] 03/25/2013  . Smoking hx [Z72.0] 03/25/2013  . Alcohol abuse [F10.10] 03/25/2013  . Bilateral leg edema [R60.0] 03/25/2013  . HTN (hypertension) [I10]     Total Time spent with patient: 1 hour  Subjective:   Rodney [name]  is a 69 y.o. male patient admitted with abdominal pain, nausea, vomiting and diarrhea.  HPI:  Rodney [name] is a 69 y.o. male seen face-to-face for the psychiatric consultation and evaluation of capacity. Patient seen today with the psychiatric clinical social service. Patient is awake, alert, oriented to time, place person and situation. Patient reported he is a resident of [facility] over one year and recently started suffering with nausea, vomiting, diarrhea and abdominal pain. Patient reported his doctors told him he has a colon cancer and knee recommended surgery. 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 Patient wanted to go back to [facility] and hope he can eat normally. Patient was never married, has no children and has siblings with the limited relationship. 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 Psychiatric clinical social service will contact [facility] for contact information and to check if there is any possible legal guardians. 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 Patient also questions regarding orientation 8 out of 10, immediate memory 3 out of 3, delayed memory 2 out of 3 and language functions intact. Patient does not have abstract thinking, got upset and refused to answer questions at the end of Mini-Mental Status Examination. Patient is able to say the names of week from Monday to Sunday but refused to go backwards from Sunday to Monday so it is difficult to formally assess his concentration level at this time. He states that he does smoke. He states that he has a history of ETOH  use in the past no drinks in the past 2-3 years. Patient denied current symptoms of depression, anxiety, mania, psychosis, suicidal/homicidal ideation, intention or plans.     Past Medical History:  Past Medical History  Diagnosis Date  . HTN (hypertension)   . Smoking hx 03/25/2013  . Alcohol abuse 03/25/2013  . CHF (congestive heart failure) 03/25/2013  . Peripheral vascular disease     Past Surgical History  Procedure Laterality Date  . Above knee leg amputation Right   . Amputation Right 03/26/2013    Procedure: AMPUTATION ABOVE KNEE;  Surgeon: Rosetta Posner, MD;  Location: [facility];  Service: Vascular;  Laterality: Right;  . Amputation Left 02/06/2014    Procedure: LEFT ABOVE KNEE AMPUTATION;  Surgeon: Mal Misty, MD;  Location: [facility];  Service: Vascular;  Laterality: Left;  . Left and right heart catheterization with coronary angiogram N/A 04/02/2013    Procedure: LEFT AND RIGHT HEART CATHETERIZATION WITH CORONARY ANGIOGRAM;  Surgeon: Larey Dresser, MD;  Location[facility] CATH LAB;  Service: Cardiovascular;  Laterality: N/A;  . Lower extremity angiogram Left 08/18/2013    Procedure: LOWER EXTREMITY ANGIOGRAM;  Surgeon: Conrad [place] Baltimore, MD;  Location: [facility] CATH LAB;  Service: Cardiovascular;  Laterality: Left;   Family History:  Family History  Problem Relation Age of Onset  . Heart disease      No family history   Social History:  History  Alcohol Use No    Comment: everyday     History  Drug Use  . Yes  . Special: Marijuana    History   Social History  . Marital Status: Single    Spouse Name: N/A  . Number of Children: N/A  . Years of Education: N/A   Social History Main Topics  . Smoking status: Current Some Day Smoker    Types: Cigarettes  . Smokeless tobacco: Not on file  . Alcohol Use: No     Comment: everyday  . Drug Use: Yes    Special: Marijuana  . Sexual Activity: No   Other Topics Concern  . None   Social History Narrative   Additional Social History:                          Allergies:  No Known Allergies  Labs:  Results for orders placed or performed during the hospital encounter of 10/22/14 (from  the past 48 hour(s))  Urinalysis, Routine w reflex microscopic (not at [facility])     Status: Abnormal   Collection Time: 10/22/14  4:38 PM  Result Value Ref Range   Color, Urine YELLOW YELLOW   APPearance CLOUDY (A) CLEAR   Specific Gravity, Urine 1.024 1.005 - 1.030   pH 6.0 5.0 - 8.0   Glucose, UA NEGATIVE NEGATIVE mg/dL   Hgb urine dipstick TRACE (A) NEGATIVE   Bilirubin Urine NEGATIVE NEGATIVE   Ketones, ur 15 (A) NEGATIVE mg/dL   Protein, ur 30 (A) NEGATIVE mg/dL   Urobilinogen, UA 0.2 0.0 - 1.0 mg/dL   Nitrite NEGATIVE NEGATIVE   Leukocytes, UA LARGE (A) NEGATIVE  Urine microscopic-add on     Status: Abnormal   Collection Time: 10/22/14  4:38 PM  Result Value Ref Range   Squamous Epithelial / LPF RARE RARE   WBC, UA TOO NUMEROUS TO COUNT <3 WBC/hpf   Bacteria, UA MANY (A) RARE  Lipase, blood     Status: None   Collection Time: 10/22/14  5:07 PM  Result Value Ref Range   Lipase 30 22 - 51 U/L  Comprehensive metabolic panel     Status: Abnormal   Collection Time: 10/22/14  5:07 PM  Result Value Ref Range   Sodium 136 135 - 145 mmol/L   Potassium 3.4 (L) 3.5 - 5.1 mmol/L   Chloride 106 101 - 111 mmol/L   CO2 20 (L) 22 - 32 mmol/L   Glucose, Bld 96 65 - 99 mg/dL   BUN 21 (H) 6 - 20 mg/dL   Creatinine, Ser 0.73 0.61 - 1.24 mg/dL   Calcium 9.3 8.9 - 10.3 mg/dL   Total Protein 8.6 (H) 6.5 - 8.1 g/dL   Albumin 3.4 (L) 3.5 - 5.0 g/dL   AST 20 15 - 41 U/L   ALT 16 (L) 17 - 63 U/L   Alkaline Phosphatase 157 (H) 38 - 126 U/L   Total Bilirubin 0.5 0.3 - 1.2 mg/dL   GFR calc non Af Amer >60 >60 mL/min   GFR calc Af Amer >60 >60 mL/min    Comment: (NOTE) The eGFR has been calculated using the CKD EPI equation. This calculation has not been validated in all clinical situations. eGFR's persistently <60 mL/min signify possible Chronic Kidney Disease.    Anion gap 10 5 - 15  CBC     Status: Abnormal   Collection Time: 10/22/14  5:07 PM  Result Value Ref Range   WBC 10.2 4.0 -  10.5 K/uL   RBC 3.86 (L) 4.22 - 5.81 MIL/uL   Hemoglobin 9.7 (L) 13.0 - 17.0 g/dL   HCT 29.9 (L) 39.0 - 52.0 %   MCV 77.5 (L) 78.0 - 100.0 fL   MCH 25.1 (L) 26.0 - 34.0 pg   MCHC 32.4 30.0 - 36.0 g/dL   RDW 16.8 (H) 11.5 - 15.5 %   Platelets 416 (H) 150 - 400 K/uL  Surgical pcr screen     Status: None   Collection Time: 10/22/14 11:13 PM  Result Value Ref Range   MRSA, PCR NEGATIVE NEGATIVE   Staphylococcus aureus NEGATIVE NEGATIVE    Comment:        The Xpert SA Assay (FDA approved for NASAL specimens in patients over 55 years of age), is one component of a comprehensive surveillance program.  Test performance has been validated by [facility] for patients greater than or equal to 85 year old. It is not intended to diagnose infection nor to guide or monitor treatment.   TSH     Status: None   Collection Time: 10/22/14 11:38 PM  Result Value Ref Range   TSH 2.877 0.350 - 4.500 uIU/mL  Iron and TIBC     Status: Abnormal   Collection Time: 10/22/14 11:38 PM  Result Value Ref Range   Iron 29 (L) 45 - 182 ug/dL   TIBC 162 (L) 250 - 450 ug/dL   Saturation Ratios 18 17.9 - 39.5 %   UIBC 133 ug/dL    Comment: Performed at [facility]  Ferritin     Status: Abnormal   Collection Time: 10/22/14 11:38 PM  Result Value Ref Range   Ferritin 751 (H) 24 - 336 ng/mL    Comment: Performed at [facility]  Vitamin B12     Status: None   Collection Time: 10/22/14 11:38 PM  Result Value Ref Range   Vitamin B-12 351 180 - 914 pg/mL    Comment: (NOTE) This assay is not validated for testing neonatal or myeloproliferative syndrome specimens  for Vitamin B12 levels. Performed at [facility]   Lipid panel     Status: Abnormal   Collection Time: 10/22/14 11:38 PM  Result Value Ref Range   Cholesterol 141 0 - 200 mg/dL   Triglycerides 143 <150 mg/dL   HDL 26 (L) >40 mg/dL   Total CHOL/HDL Ratio 5.4 RATIO   VLDL 29 0 - 40 mg/dL   LDL Cholesterol 86 0 - 99 mg/dL     Comment:        Total Cholesterol/HDL:CHD Risk Coronary Heart Disease Risk Table                     Men   Women  1/2 Average Risk   3.4   3.3  Average Risk       5.0   4.4  2 X Average Risk   9.6   7.1  3 X Average Risk  23.4   11.0        Use the calculated Patient Ratio above and the CHD Risk Table to determine the patient's CHD Risk.        ATP III CLASSIFICATION (LDL):  <100     mg/dL   Optimal  100-129  mg/dL   Near or Above                    Optimal  130-159  mg/dL   Borderline  160-189  mg/dL   High  >190     mg/dL   Very High Performed at [facility]   CBC     Status: Abnormal   Collection Time: 10/23/14  3:45 AM  Result Value Ref Range   WBC 8.8 4.0 - 10.5 K/uL   RBC 3.79 (L) 4.22 - 5.81 MIL/uL   Hemoglobin 9.4 (L) 13.0 - 17.0 g/dL   HCT 29.9 (L) 39.0 - 52.0 %   MCV 78.9 78.0 - 100.0 fL   MCH 24.8 (L) 26.0 - 34.0 pg   MCHC 31.4 30.0 - 36.0 g/dL   RDW 16.8 (H) 11.5 - 15.5 %   Platelets 392 150 - 400 K/uL  Comprehensive metabolic panel     Status: Abnormal   Collection Time: 10/23/14  3:45 AM  Result Value Ref Range   Sodium 131 (L) 135 - 145 mmol/L   Potassium 3.9 3.5 - 5.1 mmol/L   Chloride 103 101 - 111 mmol/L   CO2 20 (L) 22 - 32 mmol/L   Glucose, Bld 83 65 - 99 mg/dL   BUN 18 6 - 20 mg/dL   Creatinine, Ser 0.76 0.61 - 1.24 mg/dL   Calcium 8.9 8.9 - 10.3 mg/dL   Total Protein 7.9 6.5 - 8.1 g/dL   Albumin 3.2 (L) 3.5 - 5.0 g/dL   AST 20 15 - 41 U/L   ALT 14 (L) 17 - 63 U/L   Alkaline Phosphatase 142 (H) 38 - 126 U/L   Total Bilirubin 0.4 0.3 - 1.2 mg/dL   GFR calc non Af Amer >60 >60 mL/min   GFR calc Af Amer >60 >60 mL/min    Comment: (NOTE) The eGFR has been calculated using the CKD EPI equation. This calculation has not been validated in all clinical situations. eGFR's persistently <60 mL/min signify possible Chronic Kidney Disease.    Anion gap 8 5 - 15  Protime-INR     Status: Abnormal   Collection Time: 10/23/14  3:45 AM   Result Value Ref Range   Prothrombin Time  16.9 (H) 11.6 - 15.2 seconds   INR 1.36 0.00 - 1.49  APTT     Status: Abnormal   Collection Time: 10/23/14  3:45 AM  Result Value Ref Range   aPTT 42 (H) 24 - 37 seconds    Comment:        IF BASELINE aPTT IS ELEVATED, SUGGEST PATIENT RISK ASSESSMENT BE USED TO DETERMINE APPROPRIATE ANTICOAGULANT THERAPY.   Glucose, capillary     Status: None   Collection Time: 10/23/14  7:51 AM  Result Value Ref Range   Glucose-Capillary 98 65 - 99 mg/dL   Comment 1 Notify RN     Vitals: Blood pressure 124/58, pulse 75, temperature 97.1 F (36.2 C), temperature source Oral, resp. rate 16, height 2' 11"  (0.889 m), weight 50.848 kg (112 lb 1.6 oz), SpO2 99 %.  Risk to Self: Is patient at risk for suicide?: No Risk to Others:   Prior Inpatient Therapy:   Prior Outpatient Therapy:    Current Facility-Administered Medications  Medication Dose Route Frequency Provider Last Rate Last Dose  . 0.9 %  sodium chloride infusion  250 mL Intravenous PRN Excell Seltzer, MD      . acetaminophen (TYLENOL) tablet 650 mg  650 mg Oral Q6H PRN Allyne Gee, MD       Or  . acetaminophen (TYLENOL) suppository 650 mg  650 mg Rectal Q6H PRN Allyne Gee, MD      . antiseptic oral rinse (CPC / CETYLPYRIDINIUM CHLORIDE 0.05%) solution 7 mL  7 mL Mouth Rinse q12n4p Allyne Gee, MD      . atorvastatin (LIPITOR) tablet 40 mg  40 mg Oral QPM Allyne Gee, MD   40 mg at 10/22/14 2250  . carvedilol (COREG) tablet 6.25 mg  6.25 mg Oral BID WC Allyne Gee, MD   6.25 mg at 10/23/14 0806  . cefTRIAXone (ROCEPHIN) 1 g in dextrose 5 % 50 mL IVPB  1 g Intravenous Q24H Berton Mount, RPH      . chlorhexidine (PERIDEX) 0.12 % solution 15 mL  15 mL Mouth Rinse BID Allyne Gee, MD   15 mL at 10/23/14 0806  . dicyclomine (BENTYL) tablet 20 mg  20 mg Oral QHS Allyne Gee, MD   20 mg at 10/22/14 2250  . escitalopram (LEXAPRO) tablet 5 mg  5 mg Oral Daily Allyne Gee, MD       . folic acid (FOLVITE) tablet 1 mg  1 mg Oral Daily Allyne Gee, MD   1 mg at 10/22/14 2251  . furosemide (LASIX) tablet 20 mg  20 mg Oral Daily PRN Allyne Gee, MD      . mirtazapine (REMERON) tablet 30 mg  30 mg Oral QHS Allyne Gee, MD   30 mg at 10/22/14 2250  . morphine 2 MG/ML injection 2 mg  2 mg Intravenous Q3H PRN Gardiner Barefoot, NP   2 mg at 10/23/14 0438  . multivitamin with minerals tablet 1 tablet  1 tablet Oral Daily Allyne Gee, MD   1 tablet at 10/22/14 2251  . ondansetron (ZOFRAN) tablet 4 mg  4 mg Oral Q6H PRN Allyne Gee, MD       Or  . ondansetron [facility]) injection 4 mg  4 mg Intravenous Q6H PRN Allyne Gee, MD      . oxyCODONE (Oxy IR/ROXICODONE) immediate release tablet 5 mg  5 mg Oral Q4H PRN Allyne Gee, MD  5 mg at 10/22/14 2129  . sodium chloride 0.9 % injection 3 mL  3 mL Intravenous Q12H Allyne Gee, MD   3 mL at 10/22/14 2250  . sodium chloride 0.9 % injection 3 mL  3 mL Intravenous PRN Allyne Gee, MD      . thiamine (VITAMIN B-1) tablet 100 mg  100 mg Oral Daily Allyne Gee, MD   100 mg at 10/22/14 2251  . vitamin C (ASCORBIC ACID) tablet 500 mg  500 mg Oral Daily Allyne Gee, MD      . zinc sulfate capsule 220 mg  220 mg Oral Daily Allyne Gee, MD        Musculoskeletal: Strength & Muscle Tone: within normal limits Gait & Station: Bilateral AKA Patient leans: N/A  Psychiatric Specialty Exam: Physical Exam as per history and physical   ROS abdominal pain, nausea, vomiting and diarrhea but denies shortness of breath and chest pain No Fever-chills, No Headache, No changes with Vision or hearing, reports vertigo No problems swallowing food or Liquids, No Chest pain, Cough or Shortness of Breath, No Abdominal pain, No Nausea or Vommitting, Bowel movements are regular, No Blood in stool or Urine, No dysuria, No new skin rashes or bruises, No new joints pains-aches,  No new weakness, tingling, numbness in any extremity, No  recent weight gain or loss, No polyuria, polydypsia or polyphagia,   A full 10 point Review of Systems was done, except as stated above, all other Review of Systems were negative.  Blood pressure 124/58, pulse 75, temperature 97.1 F (36.2 C), temperature source Oral, resp. rate 16, height 2' 11"  (0.889 m), weight 50.848 kg (112 lb 1.6 oz), SpO2 99 %.Body mass index is 64.34 kg/(m^2).  General Appearance: Casual  Eye Contact::  Good  Speech:  Clear and Coherent  Volume:  Normal  Mood:  Euthymic  Affect:  Appropriate and Congruent  Thought Process:  Coherent and Goal Directed  Orientation:  Full (Time, Place, and Person)  Thought Content:  WDL  Suicidal Thoughts:  No  Homicidal Thoughts:  No  Memory:  Immediate;   Fair Recent;   Fair  Judgement:  Impaired  Insight:  Shallow  Psychomotor Activity:  Normal  Concentration:  NA  Recall:  [employer] of Knowledge:Fair  Language: Good  Akathisia:  Negative  Handed:  Right  AIMS (if indicated):     Assets:  Agricultural consultant Housing Leisure Time Resilience  ADL's:  Intact  Cognition: WNL  Sleep:      Medical Decision Making: New problem, with additional work up planned, Review of Psycho-Social Stressors (1), Review or order clinical lab tests (1), Review of Last Therapy Session (1), Review or order medicine tests (1), Review of Medication Regimen & Side Effects (2) and Review of New Medication or Change in Dosage (2)  Treatment Plan Summary: Patient presented with gastrointestinal symptoms and found colonic mass which may be cancerous and spreads to the other parts of the body. Patient was educated about his medical condition. Patient seems to understand what refuses surgeries which was recommended absolute necessary. Patient stated he does not trust doctors because the bilateral amputation of his both legs about a year ago while living in [place] Daily contact with patient to assess and evaluate  symptoms and progress in treatment and Medication management  Plan:  Patient does not meet criteria for involuntary commitment Patient has capacity to make his own medical decisions and living arrangements based  on my evaluation today Patient seems to have poor insight, judgment and reluctant to go for absolute necessary surgery at this time Will ask psychiatric clinical social service to contact his family members and [facility] facility for possible documentation regarding legal guardians Supportive therapy provided about ongoing stressors. Appreciate psychiatric consultation Please contact [phone] or [phone] if needs further assistance  Disposition: Patient benefit from surgical treatment as an absolute absolute necessary treatment and continue to provide further education and involving family members if possible.   Rodney [name],JANARDHAHA R. 10/23/2014 9:59 AM

## 2014-10-23 NOTE — Progress Notes (Signed)
Called Magda Paganini RN in ED to obtain report on patient.

## 2014-10-23 NOTE — Plan of Care (Signed)
Problem: Phase I Progression Outcomes Goal: OOB as tolerated unless otherwise ordered Outcome: Not Met (add Reason) Wheelchair bound         

## 2014-10-23 NOTE — Plan of Care (Signed)
Problem: Phase I Progression Outcomes Goal: Pain controlled with appropriate interventions Outcome: Progressing Patient with intermittent abd pain. Given oxyir in ED but still with significant pain when arrived to floor. Paged NP and order obtained for morphine prn.

## 2014-10-23 NOTE — Care Management Important Message (Addendum)
Important Message  Patient Details  Name: Rodney [name] MRN: [id number] Date of Birth: June 16, 1945   Medicare Important Message Given:  Yes-second notification given    Shelda Altes 10/23/2014, 1:48 PM Important Message  Patient Details  Name: Rodney [name] MRN: [id number] Date of Birth: Jul 27, 1945   Medicare Important Message Given:  Yes-second notification given    Shelda Altes 10/23/2014, 1:48 PM

## 2014-10-23 NOTE — Progress Notes (Signed)
Patient with bilateral AKA's. Order for SCDs but unable to use them with this patient due to his amputations.

## 2014-10-23 NOTE — Progress Notes (Signed)
Initial Nutrition Assessment  DOCUMENTATION CODES:   Non-severe (moderate) malnutrition in context of chronic illness  INTERVENTION:  - Will order Ensure Enlive po BID, each supplement provides 350 kcal and 20 grams of protein - RD will continue to monitor for needs  NUTRITION DIAGNOSIS:   Inadequate oral intake related to inability to eat as evidenced by NPO status.  GOAL:   Patient will meet greater than or equal to 90% of their needs  MONITOR:   Diet advancement, Weight trends, Labs, I & O's  REASON FOR ASSESSMENT:   Malnutrition Screening Tool  ASSESSMENT:  68 y.o. male with multiple medical problems presents with abdominal pain. Patient states that the pain started about 3 days ago and is mainly located in the right side lower quadrant. He states that he has had a poor appetite also. He states he has been losing weight. He has had some diarrhea. He admits to nausea and also some vomting. He states he has no blood in his stools. Patient has prior history of vascular disease and has had bilateral amputations. He states that he does smoke. He states that he has a history of ETOH use in the past no drinks in the past 2-3 years. In the ED he had a CT of the abdomen done and this shows presence of a colonic mass likely malignant.  Pt seen for MST. Pt currently NPO, per RN. Pt reports poor appetite x1-2 days PTA but denies abdominal pain or nausea; noted from notes in chart that pt stated he was having nausea and vomiting PTA. Pt states that he drank Ensure occasionally PTA and is interested in receiving it during admission.  He states that he has been losing weight recently but is unsure of time frame or UBW. Limited weight hx available in the chart but shows stable weight over the past 1.5 years.  Not meeting needs. Mild muscle and fat wasting noted to upper body; bilateral AKA. Medications reviewed. Labs reviewed; Na: 131 mmol/L.  Diet Order:  Diet NPO time specified  Skin:   Reviewed, no issues  Last BM:  PTA  Height:   Ht Readings from Last 1 Encounters:  10/23/14 2\' 11"  (0.889 m)    Weight:   Wt Readings from Last 1 Encounters:  10/23/14 112 lb 1.6 oz (50.848 kg)    Ideal Body Weight:     Wt Readings from Last 10 Encounters:  10/23/14 112 lb 1.6 oz (50.848 kg)  02/09/14 113 lb 15.7 oz (51.7 kg)  01/22/14 105 lb (47.628 kg)  01/20/14 105 lb 5 oz (47.769 kg)  08/21/13 103 lb 13.4 oz (47.1 kg)  04/29/13 107 lb (48.535 kg)  04/07/13 107 lb 1.6 oz (48.58 kg)    BMI:  Body mass index is 64.34 kg/(m^2).  Estimated Nutritional Needs:   Kcal:  1100-1300  Protein:  55-65 grams  Fluid:  1.7 L/day  EDUCATION NEEDS:   No education needs identified at this time     Jarome Matin, RD, LDN Inpatient Clinical Dietitian Pager # [phone] After hours/weekend pager # [phone]

## 2014-10-24 LAB — COMPREHENSIVE METABOLIC PANEL
ALT: 13 U/L — ABNORMAL LOW (ref 17–63)
ANION GAP: 9 (ref 5–15)
AST: 21 U/L (ref 15–41)
Albumin: 3.1 g/dL — ABNORMAL LOW (ref 3.5–5.0)
Alkaline Phosphatase: 140 U/L — ABNORMAL HIGH (ref 38–126)
BILIRUBIN TOTAL: 0.5 mg/dL (ref 0.3–1.2)
BUN: 16 mg/dL (ref 6–20)
CO2: 20 mmol/L — ABNORMAL LOW (ref 22–32)
CREATININE: 0.75 mg/dL (ref 0.61–1.24)
Calcium: 8.8 mg/dL — ABNORMAL LOW (ref 8.9–10.3)
Chloride: 101 mmol/L (ref 101–111)
Glucose, Bld: 94 mg/dL (ref 65–99)
Potassium: 3.5 mmol/L (ref 3.5–5.1)
SODIUM: 130 mmol/L — AB (ref 135–145)
Total Protein: 7.9 g/dL (ref 6.5–8.1)

## 2014-10-24 LAB — HEMOGLOBIN A1C
Hgb A1c MFr Bld: 5.9 % — ABNORMAL HIGH (ref 4.8–5.6)
MEAN PLASMA GLUCOSE: 123 mg/dL

## 2014-10-24 LAB — CBC
HCT: 28.3 % — ABNORMAL LOW (ref 39.0–52.0)
Hemoglobin: 9.1 g/dL — ABNORMAL LOW (ref 13.0–17.0)
MCH: 24.9 pg — ABNORMAL LOW (ref 26.0–34.0)
MCHC: 32.2 g/dL (ref 30.0–36.0)
MCV: 77.3 fL — ABNORMAL LOW (ref 78.0–100.0)
PLATELETS: 395 10*3/uL (ref 150–400)
RBC: 3.66 MIL/uL — ABNORMAL LOW (ref 4.22–5.81)
RDW: 16.8 % — ABNORMAL HIGH (ref 11.5–15.5)
WBC: 8.5 10*3/uL (ref 4.0–10.5)

## 2014-10-24 LAB — GLUCOSE, CAPILLARY: Glucose-Capillary: 105 mg/dL — ABNORMAL HIGH (ref 65–99)

## 2014-10-24 NOTE — Progress Notes (Signed)
Subjective: He reports that he passed some gas and a small amount of stool.  Abdominal felt better afterwards.  Objective: Vital signs in last 24 hours: Temp:  [97.8 F (36.6 C)-98.3 F (36.8 C)] 97.8 F (36.6 C) (07/23 0635) Pulse Rate:  [77-81] 78 (07/23 0635) Resp:  [18-20] 18 (07/23 0635) BP: (110-153)/(56-76) 153/66 mmHg (07/23 0635) SpO2:  [99 %-100 %] 100 % (07/23 0635) Last BM Date: 10/21/14  Intake/Output from previous day: 07/22 0701 - 07/23 0700 In: 316 [P.O.:200; I.V.:66; IV Piggyback:50] Out: 925 [Urine:925] Intake/Output this shift:    PE: General- In NAD Abdomen-slightly firm, distended, minimal tenderness  Lab Results:   Recent Labs  10/23/14 0345 10/24/14 0450  WBC 8.8 8.5  HGB 9.4* 9.1*  HCT 29.9* 28.3*  PLT 392 395   BMET  Recent Labs  10/23/14 0345 10/24/14 0450  NA 131* 130*  K 3.9 3.5  CL 103 101  CO2 20* 20*  GLUCOSE 83 94  BUN 18 16  CREATININE 0.76 0.75  CALCIUM 8.9 8.8*   PT/INR  Recent Labs  10/23/14 0345  LABPROT 16.9*  INR 1.36   Comprehensive Metabolic Panel:    Component Value Date/Time   NA 130* 10/24/2014 0450   NA 131* 10/23/2014 0345   K 3.5 10/24/2014 0450   K 3.9 10/23/2014 0345   CL 101 10/24/2014 0450   CL 103 10/23/2014 0345   CO2 20* 10/24/2014 0450   CO2 20* 10/23/2014 0345   BUN 16 10/24/2014 0450   BUN 18 10/23/2014 0345   CREATININE 0.75 10/24/2014 0450   CREATININE 0.76 10/23/2014 0345   GLUCOSE 94 10/24/2014 0450   GLUCOSE 83 10/23/2014 0345   CALCIUM 8.8* 10/24/2014 0450   CALCIUM 8.9 10/23/2014 0345   AST 21 10/24/2014 0450   AST 20 10/23/2014 0345   ALT 13* 10/24/2014 0450   ALT 14* 10/23/2014 0345   ALKPHOS 140* 10/24/2014 0450   ALKPHOS 142* 10/23/2014 0345   BILITOT 0.5 10/24/2014 0450   BILITOT 0.4 10/23/2014 0345   PROT 7.9 10/24/2014 0450   PROT 7.9 10/23/2014 0345   ALBUMIN 3.1* 10/24/2014 0450   ALBUMIN 3.2* 10/23/2014 0345     Studies/Results: Ct Abdomen Pelvis  W Contrast  10/22/2014   CLINICAL DATA:  69 year old male with right lower quadrant and periumbilical pain. Emesis  EXAM: CT ABDOMEN AND PELVIS WITH CONTRAST  TECHNIQUE: Multidetector CT imaging of the abdomen and pelvis was performed using the standard protocol following bolus administration of intravenous contrast.  CONTRAST:  49mL OMNIPAQUE IOHEXOL 300 MG/ML SOLN, 113mL OMNIPAQUE IOHEXOL 300 MG/ML SOLN  COMPARISON:  CT dated 08/21/2013  FINDINGS: Partially visualized small right pleural profusion. Top-normal cardiac size. Right cardiophrenic angle top-normal lymph nodes. No intra-abdominal free air. Small ascites.  There are innumerable hepatic hypodense lesions, increased from prior study. Some lesion such as the 3.8 x 4.5 cm hypodense lesion in the right lobe the liver adjacent the right hepatic vein likely represents a hemangioma and others were previously characterized as cysts. Other lesions such as a 2.4 x 3.4 cm subcapsular lesion seen in the right hepatic lobe posteriorly (series 2, image 19) are not well characterized but suspicious for metastatic disease. There are nodular implants predominantly involving the surface of the right lobe of the liver with scalloping of the liver parenchyma compatible with subcapsular implants.  The gallbladder is not visualized, likely surgically absent. The pancreas is unremarkable. Surgical clips noted adjacent the head of the pancreas. Multiple hypodense lesions  noted predominantly involving the surface of the spleen concerning for metastatic implant. Left adrenal thickening/hyperplasia. There are bilateral renal cortical irregularity is. There is apparent diffuse thickening of the bladder wall which may be partly related to underdistention. Cystitis is not excluded. Correlation with urinalysis recommended. The prostate and seminal vesicles are grossly unremarkable.  There is an ill-defined irregular nodular mass measuring approximately 3.5 x 2.3 cm involving the  distal transverse colon most compatible with malignancy. There is high-grade stenosis of the bowel with dilatation of the colon proximal to this mass. There is no evidence of small bowel obstruction. Scattered sigmoid diverticula without active inflammation.  There is aortoiliac atherosclerotic disease. A 2.6 cm infrarenal abdominal aortic ectasia. 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  There are nodular implants in the left lower abdomen with a combined dimension 1.9 x 4.7 cm. There is a 3.7 x 2.0 cm soft tissue implant is noted in the right lower quadrant adjacent the appendix. Multiple nodular implants are also noted adjacent to the tip of the appendix. Two 2 adjacent implants and free fluid extending from the subhepatic area. However, No definite evidence of acute appendicitis noted.  Multiple nodular densities anterior and inferior to the spleen compatible with metastatic implants.  Retroperitoneal/right periaortic adenopathy measures 1.3 cm in short axis.  Right anterior upper thigh lipoma noted with there is degenerative changes of the spine. No acute fracture.  IMPRESSION: Distal transverse colon obstructing mass most compatible with malignancy. Further evaluation with colonoscopy and tissue sampling recommended. There is high-grade stenosis and dilatation of the colon proximal to this mass.  Peritoneal, mesenteric, and hepatic metastatic disease.  No definite CT evidence of acute appendicitis.   Electronically Signed   By: Anner Crete M.D.   On: 10/22/2014 19:59    Anti-infectives: Anti-infectives    Start     Dose/Rate Route Frequency Ordered Stop   10/23/14 2200  cefTRIAXone (ROCEPHIN) 1 g in dextrose 5 % 50 mL IVPB     1 g 100 mL/hr  over 30 Minutes Intravenous Every 24 hours 10/22/14 2121     10/22/14 2030  cefTRIAXone (ROCEPHIN) 1 g in dextrose 5 % 50 mL IVPB     1 g 100 mL/hr over 30 Minutes Intravenous  Once 10/22/14 2017 10/22/14 2145      Assessment High grade obstructing colonic lesion with liver lesions and peritoneal nodules all clinically consistent with metastatic colon cancer-palliative partial colectomy and/or colostomy has been recommended to him.  Awaiting cardiac preop assessment.     LOS: 2 days   Plan: Wait for preop cardiac assessment.  This will help determine that extent of surgery he could safely tolerate.  NPO.   Rodney [name] 10/24/2014

## 2014-10-24 NOTE — Care Management Note (Signed)
Case Management Note  Patient Details  Name: Rodney [name] MRN: [id number] Date of Birth: 1945/07/10  Subjective/Objective:                    Action/Plan:  Discharge planning  Expected Discharge Date:   (unknown)               Expected Discharge Plan:     In-House Referral:  Clinical Social Work  Discharge planning Services  CM Consult  Post Acute Care Choice:    Choice offered to:     DME Arranged:    DME Agency:     HH Arranged:    [place] Agency:     Status of Service:  In process, will continue to follow  Medicare Important Message Given:  Yes-second notification given Date Medicare IM Given:    Medicare IM give by:    Date Additional Medicare IM Given:    Additional Medicare Important Message give by:     If discussed at [place] of Stay Meetings, dates discussed:    Additional Comments: CM spoke with patient at the bedside. Patient gave permission for CM to contact his niece [name] per the niece's request. CM spoke with [name] via telephone. [name] voices concerns about the patient's competence. She states he is incompetent and she would like to know what she and her mother( the patient's sister) need to do. Informed [name] the patient has been evaluated and determined competent. She disagrees with the evaluation. Reports the patient did not contact any of his family for 7 months after he had his legs amputated. They were able to make contact with him due to the current admission. She agrees to discuss her concerns with the patient's physician.   Patient states he is from [facility]. He is interested in getting an apartment and having someone come in to assist him. Will refer to SW for housing resources.  [name], RN 10/24/2014, 12:03 PM

## 2014-10-24 NOTE — Progress Notes (Signed)
[facility] PROGRESS NOTE  Rodney [name] MCN:[id number] DOB: [birth year] DOA: 10/22/2014 PCP: [name], NP  Summary 10/23/14: I have seen and examined Mr. Rodney [name] at bedside and reviewed his chart. Appreciate general surgery/psychiatrist/GI. Rodney [name] is a 69 y.o. male with multiple medical problems who presents with abdominal pain, and he was found to have UTI/colonic mass concerning for malignancy, with CT abdomen and pelvis showing "Distal transverse colon obstructing mass most compatible with malignancy. Further evaluation with colonoscopy and tissue sampling recommended. There is high-grade stenosis and dilatation of the colon proximal to this mass. Peritoneal, mesenteric, and hepatic metastatic disease. No definite CT evidence of acute appendicitis". Patient has been offered surgical intervention but he refuses and has been deemed to have mental capacity to make decisions regarding his medical care. He understands he may die without acute intervention. He requests food. We'll therefore respect his wishes, start feeds and continued to deliberate with him going forward. 10/24/2014: Appreciate general surgery. Patient sent to be changing his mind regarding surgery. Will consult cardiology for risk stratification in a.m. Plan Colonic mass likely malignant  Follow surgical recommendations  Consult cardiology for risk stratification in a.m. UTI (urinary tract infection)  Follow urine culture  Continue ceftriaxone HTN (hypertension)/CAD (coronary artery disease)/Chronic systolic CHF (congestive heart failure), NYHA class 4/Anemia/Malnutrition of moderate degree  No acute changes  Continue current management Code Status: Full Code Family Communication: None at bedside. Disposition Plan: TBD  Consultants:  Psychiatry  General surgery  GI  Procedures:  CT abdomen pelvis  Antibiotics:  Ceftriaxone 10/22/2014>  HPI/Subjective: No specific complaints  Objective: Filed  Vitals:   10/24/14 1315  BP: 137/69  Pulse: 73  Temp: 97.6 F (36.4 C)  Resp: 20    Intake/Output Summary (Last 24 hours) at 10/24/14 2018 Last data filed at 10/24/14 1230  Gross per 24 hour  Intake    676 ml  Output    550 ml  Net    126 ml   Filed Weights   10/23/14 0445  Weight: 50.848 kg (112 lb 1.6 oz)    Exam:   General:  Comfortable at rest.  Cardiovascular: S1-S2 normal. No murmurs. Pulse regular.  Respiratory: Good air entry bilaterally. No rhonchi or rales.  Abdomen: Soft and nontender. Normal bowel sounds. No organomegaly.  Musculoskeletal: Bilateral AKA   Neurological: Intact  Data Reviewed: Basic Metabolic Panel:  Recent Labs Lab 10/22/14 1707 10/23/14 0345 10/24/14 0450  NA 136 131* 130*  K 3.4* 3.9 3.5  CL 106 103 101  CO2 20* 20* 20*  GLUCOSE 96 83 94  BUN 21* 18 16  CREATININE 0.73 0.76 0.75  CALCIUM 9.3 8.9 8.8*   Liver Function Tests:  Recent Labs Lab 10/22/14 1707 10/23/14 0345 10/24/14 0450  AST 20 20 21   ALT 16* 14* 13*  ALKPHOS 157* 142* 140*  BILITOT 0.5 0.4 0.5  PROT 8.6* 7.9 7.9  ALBUMIN 3.4* 3.2* 3.1*    Recent Labs Lab 10/22/14 1707  LIPASE 30   No results for input(s): AMMONIA in the last 168 hours. CBC:  Recent Labs Lab 10/22/14 1707 10/23/14 0345 10/24/14 0450  WBC 10.2 8.8 8.5  HGB 9.7* 9.4* 9.1*  HCT 29.9* 29.9* 28.3*  MCV 77.5* 78.9 77.3*  PLT 416* 392 395   Cardiac Enzymes: No results for input(s): CKTOTAL, CKMB, CKMBINDEX, TROPONINI in the last 168 hours. BNP (last 3 results) No results for input(s): BNP in the last 8760 hours.  ProBNP (last 3 results) No results for  input(s): PROBNP in the last 8760 hours.  CBG:  Recent Labs Lab 10/23/14 0751 10/24/14 0738  GLUCAP 98 105*    Recent Results (from the past 240 hour(s))  Urine culture     Status: None (Preliminary result)   Collection Time: 10/22/14  8:21 PM  Result Value Ref Range Status   Specimen Description URINE, CLEAN  CATCH  Final   Special Requests Normal  Final   Culture   Final    >=100,000 COLONIES/mL ESCHERICHIA COLI Performed at [facility]    Report Status PENDING  Incomplete  Surgical pcr screen     Status: None   Collection Time: 10/22/14 11:13 PM  Result Value Ref Range Status   MRSA, PCR NEGATIVE NEGATIVE Final   Staphylococcus aureus NEGATIVE NEGATIVE Final    Comment:        The Xpert SA Assay (FDA approved for NASAL specimens in patients over 45 years of age), is one component of a comprehensive surveillance program.  Test performance has been validated by [facility] for patients greater than or equal to 49 year old. It is not intended to diagnose infection nor to guide or monitor treatment.      Studies: No results found.  Scheduled Meds: . antiseptic oral rinse  7 mL Mouth Rinse q12n4p  . atorvastatin  40 mg Oral QPM  . carvedilol  6.25 mg Oral BID WC  . cefTRIAXone (ROCEPHIN)  IV  1 g Intravenous Q24H  . chlorhexidine  15 mL Mouth Rinse BID  . dicyclomine  20 mg Oral QHS  . escitalopram  5 mg Oral Daily  . feeding supplement (ENSURE ENLIVE)  237 mL Oral BID BM  . folic acid  1 mg Oral Daily  . mirtazapine  30 mg Oral QHS  . multivitamin with minerals  1 tablet Oral Daily  . sodium chloride  3 mL Intravenous Q12H  . thiamine  100 mg Oral Daily  . vitamin C  500 mg Oral Daily  . zinc sulfate  220 mg Oral Daily   Continuous Infusions:    Time spent: 25 minutes    Rodney [name]  [facility] Pager [phone]. If 7PM-7AM, please contact night-coverage at [email or web address], password [facility] 10/24/2014, 8:18 PM  LOS: 2 days

## 2014-10-25 ENCOUNTER — Inpatient Hospital Stay (HOSPITAL_COMMUNITY): Payer: Medicare Other

## 2014-10-25 DIAGNOSIS — Z01810 Encounter for preprocedural cardiovascular examination: Secondary | ICD-10-CM

## 2014-10-25 LAB — URINE CULTURE
Culture: >=100000
Special Requests: NORMAL

## 2014-10-25 LAB — GLUCOSE, CAPILLARY: Glucose-Capillary: 105 mg/dL — ABNORMAL HIGH (ref 65–99)

## 2014-10-25 MED ORDER — ALUM & MAG HYDROXIDE-SIMETH 200-200-20 MG/5ML PO SUSP
15.0000 mL | ORAL | Status: DC | PRN
  Administered 2014-10-24 – 2014-10-31 (×4): 15 mL via ORAL
  Filled 2014-10-25 (×3): qty 30

## 2014-10-25 MED ORDER — CALCIUM CARBONATE ANTACID 500 MG PO CHEW
400.0000 mg | CHEWABLE_TABLET | Freq: Three times a day (TID) | ORAL | Status: DC | PRN
  Administered 2014-10-25 – 2014-11-01 (×3): 400 mg via ORAL
  Filled 2014-10-25 (×3): qty 2

## 2014-10-25 NOTE — Plan of Care (Signed)
Problem: Phase I Progression Outcomes Goal: OOB as tolerated unless otherwise ordered Outcome: Completed/Met Date Met:  10/25/14 Obtained a wheelchair for patient to use to get oob and into bathroom

## 2014-10-25 NOTE — Clinical Social Work Note (Signed)
CSW met with pt at bedside to discuss other discharge options per consult  Pt is a double amputee and CSW prompted him to discuss his ALF and what things he is looking for in a new residence.  Pt receives $700 a month disability and wants his own apt.  Pt also wants someone to come in and cook, clean, do laundry, help with medications and provide transportation  CSW encouraged pt to look at his finances and also to discuss other options with some family to help make a decision.  CSW helped pt to look at what financially he would need considering all the help he needs  .Dede Query, LCSW [facility] Clinical Social Worker - Weekend Coverage cell #: [phone]

## 2014-10-25 NOTE — Progress Notes (Signed)
[facility] PROGRESS NOTE  Rodney [name] BOF:[id number] DOB: 1945/04/07 DOA: 10/22/2014 PCP: Rodney Grill, NP  Summary 10/23/14: I have seen and examined Rodney [name] at bedside and reviewed his chart. Appreciate general surgery/psychiatrist/GI. Rodney [name] is a 69 y.o. male with multiple medical problems who presents with abdominal pain, and he was found to have UTI/colonic mass concerning for malignancy, with CT abdomen and pelvis showing "Distal transverse colon obstructing mass most compatible with malignancy. Further evaluation with colonoscopy and tissue sampling recommended. There is high-grade stenosis and dilatation of the colon proximal to this mass. Peritoneal, mesenteric, and hepatic metastatic disease. No definite CT evidence of acute appendicitis". Patient has been offered surgical intervention but he refuses and has been deemed to have mental capacity to make decisions regarding his medical care. He understands he may die without acute intervention. He requests food. We'll therefore respect his wishes, start feeds and continued to deliberate with him going forward. 10/24/2014: Appreciate general surgery. Patient sent to be changing his mind regarding surgery. Will consult cardiology for risk stratification in a.m. 10/25/2014: Patient more open to surgery. He denies any complaints. Will obtain 2-D echocardiogram to assess ejection fraction and consult cardiology. Plan Colonic mass likely malignant  Follow surgical recommendations  Consult cardiology for risk stratification in a.m. UTI (urinary tract infection)  Follow urine culture  Continue ceftriaxone HTN (hypertension)/CAD (coronary artery disease)/Chronic systolic CHF (congestive heart failure), NYHA class 4/Anemia/Malnutrition of moderate degree  No acute changes  Continue current management  Obtain 2-D echocardiogram Code Status: Full Code Family Communication: None at bedside. Disposition Plan:  TBD  Consultants:  Psychiatry  General surgery  GI  Procedures:  CT abdomen pelvis  Antibiotics:  Ceftriaxone 10/22/2014>  HPI/Subjective: No specific complaints  Objective: Filed Vitals:   10/25/14 1405  BP: 118/59  Pulse: 71  Temp: 98.3 F (36.8 C)  Resp: 18    Intake/Output Summary (Last 24 hours) at 10/25/14 1907 Last data filed at 10/25/14 1859  Gross per 24 hour  Intake    608 ml  Output    250 ml  Net    358 ml   Filed Weights   10/23/14 0445  Weight: 50.848 kg (112 lb 1.6 oz)    Exam:   General:  Comfortable at rest.  Cardiovascular: S1-S2 normal. No murmurs. Pulse regular.  Respiratory: Good air entry bilaterally. No rhonchi or rales.  Abdomen: Soft and nontender. Normal bowel sounds. No organomegaly.  Musculoskeletal: Bilateral AKA   Neurological: Intact  Data Reviewed: Basic Metabolic Panel:  Recent Labs Lab 10/22/14 1707 10/23/14 0345 10/24/14 0450  NA 136 131* 130*  K 3.4* 3.9 3.5  CL 106 103 101  CO2 20* 20* 20*  GLUCOSE 96 83 94  BUN 21* 18 16  CREATININE 0.73 0.76 0.75  CALCIUM 9.3 8.9 8.8*   Liver Function Tests:  Recent Labs Lab 10/22/14 1707 10/23/14 0345 10/24/14 0450  AST 20 20 21   ALT 16* 14* 13*  ALKPHOS 157* 142* 140*  BILITOT 0.5 0.4 0.5  PROT 8.6* 7.9 7.9  ALBUMIN 3.4* 3.2* 3.1*    Recent Labs Lab 10/22/14 1707  LIPASE 30   No results for input(s): AMMONIA in the last 168 hours. CBC:  Recent Labs Lab 10/22/14 1707 10/23/14 0345 10/24/14 0450  WBC 10.2 8.8 8.5  HGB 9.7* 9.4* 9.1*  HCT 29.9* 29.9* 28.3*  MCV 77.5* 78.9 77.3*  PLT 416* 392 395   Cardiac Enzymes: No results for input(s): CKTOTAL, CKMB, CKMBINDEX, TROPONINI in the  last 168 hours. BNP (last 3 results) No results for input(s): BNP in the last 8760 hours.  ProBNP (last 3 results) No results for input(s): PROBNP in the last 8760 hours.  CBG:  Recent Labs Lab 10/23/14 0751 10/24/14 0738 10/25/14 0722  GLUCAP 98  105* 105*    Recent Results (from the past 240 hour(s))  Urine culture     Status: None   Collection Time: 10/22/14  8:21 PM  Result Value Ref Range Status   Specimen Description URINE, CLEAN CATCH  Final   Special Requests Normal  Final   Culture   Final    >=100,000 COLONIES/mL ESCHERICHIA COLI Performed at [facility]    Report Status 10/25/2014 FINAL  Final   Organism ID, Bacteria ESCHERICHIA COLI  Final      Susceptibility   Escherichia coli - MIC*    AMPICILLIN <=2 SENSITIVE Sensitive     CEFAZOLIN <=4 SENSITIVE Sensitive     CEFTRIAXONE <=1 SENSITIVE Sensitive     CIPROFLOXACIN <=0.25 SENSITIVE Sensitive     GENTAMICIN <=1 SENSITIVE Sensitive     IMIPENEM <=0.25 SENSITIVE Sensitive     NITROFURANTOIN <=16 SENSITIVE Sensitive     TRIMETH/SULFA <=20 SENSITIVE Sensitive     AMPICILLIN/SULBACTAM <=2 SENSITIVE Sensitive     PIP/TAZO <=4 SENSITIVE Sensitive     * >=100,000 COLONIES/mL ESCHERICHIA COLI  Surgical pcr screen     Status: None   Collection Time: 10/22/14 11:13 PM  Result Value Ref Range Status   MRSA, PCR NEGATIVE NEGATIVE Final   Staphylococcus aureus NEGATIVE NEGATIVE Final    Comment:        The Xpert SA Assay (FDA approved for NASAL specimens in patients over 45 years of age), is one component of a comprehensive surveillance program.  Test performance has been validated by [facility] for patients greater than or equal to 57 year old. It is not intended to diagnose infection nor to guide or monitor treatment.      Studies: No results found.  Scheduled Meds: . antiseptic oral rinse  7 mL Mouth Rinse q12n4p  . atorvastatin  40 mg Oral QPM  . carvedilol  6.25 mg Oral BID WC  . cefTRIAXone (ROCEPHIN)  IV  1 g Intravenous Q24H  . chlorhexidine  15 mL Mouth Rinse BID  . dicyclomine  20 mg Oral QHS  . escitalopram  5 mg Oral Daily  . feeding supplement (ENSURE ENLIVE)  237 mL Oral BID BM  . folic acid  1 mg Oral Daily  . mirtazapine  30  mg Oral QHS  . multivitamin with minerals  1 tablet Oral Daily  . sodium chloride  3 mL Intravenous Q12H  . thiamine  100 mg Oral Daily  . vitamin C  500 mg Oral Daily  . zinc sulfate  220 mg Oral Daily   Continuous Infusions:    Time spent: 15 minutes    Rodney [name]  [facility] Pager [phone]. If 7PM-7AM, please contact night-coverage at [email or web address], password [facility] 10/25/2014, 7:07 PM  LOS: 3 days

## 2014-10-25 NOTE — Progress Notes (Signed)
  Subjective: Eating a little without vomiting.  2 BMs yesterday.  Objective: Vital signs in last 24 hours: Temp:  [97.6 F (36.4 C)-98.5 F (36.9 C)] 98.5 F (36.9 C) (07/24 0517) Pulse Rate:  [71-80] 78 (07/24 0517) Resp:  [16-20] 16 (07/24 0517) BP: (114-137)/(55-69) 117/55 mmHg (07/24 0517) SpO2:  [95 %-99 %] 95 % (07/24 0517) Last BM Date: 10/24/14 (per pt very small )  Intake/Output from previous day: 07/23 0701 - 07/24 0700 In: 728 [P.O.:600; I.V.:20; IV Piggyback:50] Out: 100 [Urine:100] Intake/Output this shift:    PE: General- In NAD Abdomen-A little softer, still distended, not tender  Lab Results:   Recent Labs  10/23/14 0345 10/24/14 0450  WBC 8.8 8.5  HGB 9.4* 9.1*  HCT 29.9* 28.3*  PLT 392 395   BMET  Recent Labs  10/23/14 0345 10/24/14 0450  NA 131* 130*  K 3.9 3.5  CL 103 101  CO2 20* 20*  GLUCOSE 83 94  BUN 18 16  CREATININE 0.76 0.75  CALCIUM 8.9 8.8*   PT/INR  Recent Labs  10/23/14 0345  LABPROT 16.9*  INR 1.36   Comprehensive Metabolic Panel:    Component Value Date/Time   NA 130* 10/24/2014 0450   NA 131* 10/23/2014 0345   K 3.5 10/24/2014 0450   K 3.9 10/23/2014 0345   CL 101 10/24/2014 0450   CL 103 10/23/2014 0345   CO2 20* 10/24/2014 0450   CO2 20* 10/23/2014 0345   BUN 16 10/24/2014 0450   BUN 18 10/23/2014 0345   CREATININE 0.75 10/24/2014 0450   CREATININE 0.76 10/23/2014 0345   GLUCOSE 94 10/24/2014 0450   GLUCOSE 83 10/23/2014 0345   CALCIUM 8.8* 10/24/2014 0450   CALCIUM 8.9 10/23/2014 0345   AST 21 10/24/2014 0450   AST 20 10/23/2014 0345   ALT 13* 10/24/2014 0450   ALT 14* 10/23/2014 0345   ALKPHOS 140* 10/24/2014 0450   ALKPHOS 142* 10/23/2014 0345   BILITOT 0.5 10/24/2014 0450   BILITOT 0.4 10/23/2014 0345   PROT 7.9 10/24/2014 0450   PROT 7.9 10/23/2014 0345   ALBUMIN 3.1* 10/24/2014 0450   ALBUMIN 3.2* 10/23/2014 0345     Studies/Results: No results  found.  Anti-infectives: Anti-infectives    Start     Dose/Rate Route Frequency Ordered Stop   10/23/14 2200  cefTRIAXone (ROCEPHIN) 1 g in dextrose 5 % 50 mL IVPB     1 g 100 mL/hr over 30 Minutes Intravenous Every 24 hours 10/22/14 2121     10/22/14 2030  cefTRIAXone (ROCEPHIN) 1 g in dextrose 5 % 50 mL IVPB     1 g 100 mL/hr over 30 Minutes Intravenous  Once 10/22/14 2017 10/22/14 2145      Assessment High grade obstructing colonic lesion with liver lesions and peritoneal nodules all clinically consistent with metastatic colon cancer-palliative partial colectomy and/or colostomy has been recommended to him.  He is not sure he wants to have any surgery but is more receptive to speaking about it.  His biggest concern is having a colostomy.  He is aware of the consequences of not having surgery.   Plan: Await preop cardiac evaluation.   Golda Zavalza Lenna Sciara 10/25/2014

## 2014-10-25 NOTE — Progress Notes (Signed)
Patient refused lab draw this am. Stating "they got blood yesterday and I do not have that much".

## 2014-10-26 DIAGNOSIS — Z01810 Encounter for preprocedural cardiovascular examination: Secondary | ICD-10-CM

## 2014-10-26 DIAGNOSIS — I5022 Chronic systolic (congestive) heart failure: Secondary | ICD-10-CM

## 2014-10-26 LAB — FOLATE RBC
FOLATE, RBC: 1604 ng/mL (ref >498)
Folate, Hemolysate: 460.4 ng/mL
HEMATOCRIT: 28.7 % — AB (ref 37.5–51.0)

## 2014-10-26 LAB — GLUCOSE, CAPILLARY: GLUCOSE-CAPILLARY: 110 mg/dL — AB (ref 65–99)

## 2014-10-26 NOTE — Consult Note (Signed)
CARDIOLOGY CONSULT NOTE  Patient ID: Rodney [name], MRN: [id number], DOB/AGE: 1946/03/14 69 y.o. Admit date: 10/22/2014 Date of Consult: 10/26/2014  Primary Physician: [name], NP Primary Cardiologist:  Dr. Aundra Dubin Referring Physician: Dr Sanjuana Letters  Chief Complaint:  Abdominal pain Reason for Consultation:  Preoperative cardiac evaluation  HPI:  This is a 69 year old gentleman in very poor health. He has a history of heavy alcohol abuse. The patient has peripheral arterial disease with bilateral above-knee amputations. He was hospitalized in 2014 with Escherichia coli bacteremia and sepsis. At that time he was noted to have severe LV dysfunction with an ejection fraction less than 20%. He underwent cardiac catheterization demonstrating severe multivessel CAD.  After review of options, medical therapy was recommended. He was felt to have both ischemic and a nonischemic component to his cardiomyopathy with alcohol likely playing a significant role.  Outpatient congestive heart failure follow-up was arranged, but the patient did not show for his appointments.  The patient is now hospitalized with abdominal pain. He is found to have a high-grade obstructive colonic mass with liver and peritoneal lesions consistent with metastatic colon cancer. He is considering palliative surgery as an option.    the patient denies chest pain or shortness of breath. He is not physically active because of his multiple problems. It is difficult to obtain a thorough history.  The patient denies orthopnea , PND, lightheadedness, or syncope.  Medical History:  Past Medical History  Diagnosis Date  . HTN (hypertension)   . Smoking hx 03/25/2013  . Alcohol abuse 03/25/2013  . CHF (congestive heart failure) 03/25/2013  . Peripheral vascular disease       Surgical History:  Past Surgical History  Procedure Laterality Date  . Above knee leg amputation Right   . Amputation Right 03/26/2013    Procedure:  AMPUTATION ABOVE KNEE;  Surgeon: Rosetta Posner, MD;  Location: [place];  Service: Vascular;  Laterality: Right;  . Amputation Left 02/06/2014    Procedure: LEFT ABOVE KNEE AMPUTATION;  Surgeon: Mal Misty, MD;  Location: [place];  Service: Vascular;  Laterality: Left;  . Left and right heart catheterization with coronary angiogram N/A 04/02/2013    Procedure: LEFT AND RIGHT HEART CATHETERIZATION WITH CORONARY ANGIOGRAM;  Surgeon: Larey Dresser, MD;  Location: [facility] CATH LAB;  Service: Cardiovascular;  Laterality: N/A;  . Lower extremity angiogram Left 08/18/2013    Procedure: LOWER EXTREMITY ANGIOGRAM;  Surgeon: Conrad Carmen, MD;  Location: [facility] CATH LAB;  Service: Cardiovascular;  Laterality: Left;     Home Meds: Prior to Admission medications   Medication Sig Start Date End Date Taking? Authorizing Provider  aspirin EC 81 MG tablet Take 81 mg by mouth every morning.    Yes Historical Provider, MD  atorvastatin (LIPITOR) 40 MG tablet Take 40 mg by mouth every evening. 04/07/13  Yes [name], MD  carvedilol (COREG) 6.25 MG tablet Take 1 tablet (6.25 mg total) by mouth 2 (two) times daily with a meal. 04/07/13  Yes [name], MD  dicyclomine (BENTYL) 20 MG tablet Take 20 mg by mouth at bedtime.   Yes Historical Provider, MD  escitalopram (LEXAPRO) 5 MG tablet Take 5 mg by mouth daily. 07/31/13  Yes Historical Provider, MD  mirtazapine (REMERON) 30 MG tablet Take 30 mg by mouth at bedtime.   Yes Historical Provider, MD  vitamin C (ASCORBIC ACID) 500 MG tablet Take 500 mg by mouth daily.   Yes Historical Provider, MD  zinc  sulfate 220 MG capsule Take 220 mg by mouth daily.   Yes Historical Provider, MD  acetaminophen (TYLENOL) 325 MG tablet Take 2 tablets (650 mg total) by mouth every 6 (six) hours as needed for mild pain. 08/22/13   [name], MD  doxycycline (VIBRA-TABS) 100 MG tablet Take 1 tablet (100 mg total) by mouth every 12 (twelve) hours. Patient not taking: Reported on  10/22/2014 02/09/14   Velta Addison Mikhail, DO  furosemide (LASIX) 20 MG tablet Take 1 tablet (20 mg total) by mouth daily as needed. 02/09/14   Maryann Mikhail, DO  HYDROcodone-acetaminophen (NORCO/VICODIN) 5-325 MG per tablet Take 1 tablet by mouth every 6 (six) hours as needed for moderate pain. Patient not taking: Reported on 10/22/2014 02/09/14   Cristal Ford, DO    Inpatient Medications:  . antiseptic oral rinse  7 mL Mouth Rinse q12n4p  . atorvastatin  40 mg Oral QPM  . carvedilol  6.25 mg Oral BID WC  . cefTRIAXone (ROCEPHIN)  IV  1 g Intravenous Q24H  . chlorhexidine  15 mL Mouth Rinse BID  . dicyclomine  20 mg Oral QHS  . escitalopram  5 mg Oral Daily  . feeding supplement (ENSURE ENLIVE)  237 mL Oral BID BM  . folic acid  1 mg Oral Daily  . mirtazapine  30 mg Oral QHS  . multivitamin with minerals  1 tablet Oral Daily  . sodium chloride  3 mL Intravenous Q12H  . thiamine  100 mg Oral Daily  . vitamin C  500 mg Oral Daily  . zinc sulfate  220 mg Oral Daily      Allergies: No Known Allergies  History   Social History  . Marital Status: Single    Spouse Name: N/A  . Number of Children: N/A  . Years of Education: N/A   Occupational History  . Not on file.   Social History Main Topics  . Smoking status: Current Some Day Smoker    Types: Cigarettes  . Smokeless tobacco: Not on file  . Alcohol Use: No     Comment: everyday  . Drug Use: Yes    Special: Marijuana  . Sexual Activity: No   Other Topics Concern  . Not on file   Social History Narrative     Family History  Problem Relation Age of Onset  . Heart disease      No family history     Review of Systems: General: negative for chills, fever, night sweats  ENT: negative for rhinorrhea or epistaxis Cardiovascular: negative for chest pain, shortness of breath, dyspnea on exertion, edema, orthopnea, palpitations, or paroxysmal nocturnal dyspnea Dermatological: negative for rash Respiratory: negative for  cough or wheezing GI:  See history of present illness GU: no hematuria, urgency, or frequency Neurologic: negative for visual changes, syncope, headache, or dizziness Heme: no easy bruising or bleeding Endo: negative for excessive thirst, thyroid disorder, or flushing Musculoskeletal:  No upper extremity complaints. Bilateral AKA All other systems reviewed and are otherwise negative except as noted above.  Physical Exam: Blood pressure 152/68, pulse 74, temperature 98.2 F (36.8 C), temperature source Oral, resp. rate 18, height 2\' 11"  (0.889 m), weight 112 lb 1.6 oz (50.848 kg), SpO2 95 %. Pt is alert and awake. He is a poor informant. Chronically ill appearing HEENT: normal Neck: JVP normal. Carotid upstrokes normal without bruits. No thyromegaly. Lungs: equal expansion, clear bilaterally CV: Apex is diffuse, RRR without murmur or gallop Abd:  Firm and distended, only  mildly tender Back: no CVA tenderness Ext:  Bilateral AKA Skin: warm and dry without rash Neuro: CNII-XII intact             Strength intact = bilaterally in the arms    Labs: No results for input(s): CKTOTAL, CKMB, TROPONINI in the last 72 hours. Lab Results  Component Value Date   WBC 8.5 10/24/2014   HGB 9.1* 10/24/2014   HCT 28.3* 10/24/2014   MCV 77.3* 10/24/2014   PLT 395 10/24/2014    Recent Labs Lab 10/24/14 0450  NA 130*  K 3.5  CL 101  CO2 20*  BUN 16  CREATININE 0.75  CALCIUM 8.8*  PROT 7.9  BILITOT 0.5  ALKPHOS 140*  ALT 13*  AST 21  GLUCOSE 94   Lab Results  Component Value Date   CHOL 141 10/22/2014   HDL 26* 10/22/2014   LDLCALC 86 10/22/2014   TRIG 143 10/22/2014   No results found for: DDIMER  Radiology/Studies:  Ct Abdomen Pelvis W Contrast  10/22/2014   CLINICAL DATA:  69 year old male with right lower quadrant and periumbilical pain. Emesis  EXAM: CT ABDOMEN AND PELVIS WITH CONTRAST  TECHNIQUE: Multidetector CT imaging of the abdomen and pelvis was performed using the  standard protocol following bolus administration of intravenous contrast.  CONTRAST:  39mL OMNIPAQUE IOHEXOL 300 MG/ML SOLN, 138mL OMNIPAQUE IOHEXOL 300 MG/ML SOLN  COMPARISON:  CT dated 08/21/2013  FINDINGS: Partially visualized small right pleural profusion. Top-normal cardiac size. Right cardiophrenic angle top-normal lymph nodes. No intra-abdominal free air. Small ascites.  There are innumerable hepatic hypodense lesions, increased from prior study. Some lesion such as the 3.8 x 4.5 cm hypodense lesion in the right lobe the liver adjacent the right hepatic vein likely represents a hemangioma and others were previously characterized as cysts. Other lesions such as a 2.4 x 3.4 cm subcapsular lesion seen in the right hepatic lobe posteriorly (series 2, image 19) are not well characterized but suspicious for metastatic disease. There are nodular implants predominantly involving the surface of the right lobe of the liver with scalloping of the liver parenchyma compatible with subcapsular implants.  The gallbladder is not visualized, likely surgically absent. The pancreas is unremarkable. Surgical clips noted adjacent the head of the pancreas. Multiple hypodense lesions noted predominantly involving the surface of the spleen concerning for metastatic implant. Left adrenal thickening/hyperplasia. There are bilateral renal cortical irregularity is. There is apparent diffuse thickening of the bladder wall which may be partly related to underdistention. Cystitis is not excluded. Correlation with urinalysis recommended. The prostate and seminal vesicles are grossly unremarkable.  There is an ill-defined irregular nodular mass measuring approximately 3.5 x 2.3 cm involving the distal transverse colon most compatible with malignancy. There is high-grade stenosis of the bowel with dilatation of the colon proximal to this mass. There is no evidence of small bowel obstruction. Scattered sigmoid diverticula without active  inflammation.  There is aortoiliac atherosclerotic disease. A 2.6 cm infrarenal abdominal aortic ectasia. 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  There are nodular implants in the left lower abdomen with a combined dimension 1.9 x 4.7 cm. There is a 3.7 x 2.0 cm soft tissue implant is noted in the right lower quadrant adjacent the appendix.  Multiple nodular implants are also noted adjacent to the tip of the appendix. Two 2 adjacent implants and free fluid extending from the subhepatic area. However, No definite evidence of acute appendicitis noted.  Multiple nodular densities anterior and inferior to the spleen compatible with metastatic implants.  Retroperitoneal/right periaortic adenopathy measures 1.3 cm in short axis.  Right anterior upper thigh lipoma noted with there is degenerative changes of the spine. No acute fracture.  IMPRESSION: Distal transverse colon obstructing mass most compatible with malignancy. Further evaluation with colonoscopy and tissue sampling recommended. There is high-grade stenosis and dilatation of the colon proximal to this mass.  Peritoneal, mesenteric, and hepatic metastatic disease.  No definite CT evidence of acute appendicitis.   Electronically Signed   By: Anner Crete M.D.   On: 10/22/2014 19:59   Cardiac Studies: Cardiac catheterization 05/02/2013: Final Conclusions: Diffuse 3 vessel disease. Severe RCA and LAD lesions. Given EF 15% with diffuse HK on echo, I think that cardiomyopathy is at least partly related to ETOH abuse. Anatomy appears best-suited for CABG, but he is not going to be a very good CABG candidate. Could potentially intervene on LAD +/- RCA but would question his compliance with meds after  discharge. At this point, will follow him for a few days to see if his mental status improves. Will get cardiac MRI to look for degree of scarring/viability in left ventricle. Will increase lisinopril today to 5 mg bid. Can transition Lasix to po as CVP was 4 transduced off PICC line and LVEDP not elevated.   2D Echo 10/25/2014: Study Conclusions - Left ventricle: Hypokinesis of inferolateral wall and base inferior segment. The cavity size was normal. Wall thickness was increased in a pattern of mild LVH. The estimated ejection fraction was 50%. - Aortic valve: Moderately severe aortic regurgitation. Sclerosis without stenosis. - Left atrium: The atrium was moderately dilated. - Right ventricle: The cavity size was normal. Systolic function was normal.  ASSESSMENT AND PLAN:  1. Multivessel coronary artery disease, non-revascularized. The patient has no evidence of active ischemia or anginal symptoms.   2. Chronic systolic heart failure: no current evidence of clinical decompensation. In fact, his left ventricular function is markedly improved from previous. I suspect alcohol played a significant role and he has quit drinking with subsequent improvement and LV function.   3. Severe peripheral arterial disease with bilateral above-knee amputation   4. Metastatic colon cancer  5. Microcytic anemia , chronic  6. Preoperative evaluation : the patient is at least at moderate risk of cardiac morbidity related to major abdominal surgery because of his non-revascularized CAD. However, he has no evidence of decompensated heart failure or unstable angina. I think strictly from a cardiac perspective, he would likely survive surgery without any major adverse cardiac events. His generalized health seems to be extremely poor , and I think he will have a difficult time recovering from major surgery. However , in review of notes he really has no other options  Except for palliative/comfort care.   It would be reasonable to proceed with surgery as necessary. Unfortunately he seems to have very limited insight. We would be available to help with his postoperative cardiac care.  SignedSherren Mocha MD, [facility] 10/26/2014, 5:13 PM

## 2014-10-26 NOTE — Progress Notes (Signed)
[facility] PROGRESS NOTE  Rodney [name] QPY:[id number] DOB: 03/20/1946 DOA: 10/22/2014 PCP: [name], NP  Summary 10/23/14: I have seen and examined Rodney [name] at bedside and reviewed his chart. Appreciate general surgery/psychiatrist/GI. Rodney [name] is a 69 y.o. male with multiple medical problems who presents with abdominal pain, and he was found to have UTI/colonic mass concerning for malignancy, with CT abdomen and pelvis showing "Distal transverse colon obstructing mass most compatible with malignancy. Further evaluation with colonoscopy and tissue sampling recommended. There is high-grade stenosis and dilatation of the colon proximal to this mass. Peritoneal, mesenteric, and hepatic metastatic disease. No definite CT evidence of acute appendicitis". Patient has been offered surgical intervention but he refuses and has been deemed to have mental capacity to make decisions regarding his medical care. He understands he may die without acute intervention. He requests food. We'll therefore respect his wishes, start feeds and continued to deliberate with him going forward. 10/24/2014: Appreciate general surgery. Patient sent to be changing his mind regarding surgery. Will consult cardiology for risk stratification in a.m. 10/25/2014: Patient more open to surgery. He denies any complaints. Will obtain 2-D echocardiogram to assess ejection fraction and consult cardiology. 10/26/2014: Appreciate general surgery/cardiology/psychiatry. 2-D echocardiogram showed "- Left ventricle: Hypokinesis of inferolateral wall and base inferior segment. The cavity size was normal. Wall thickness was increased in a pattern of mild LVH. The estimated ejection fraction was 50%. - Aortic valve: Moderately severe aortic regurgitation. Sclerosis without stenosis. - Left atrium: The atrium was moderately dilated. - Right ventricle: The cavity size was normal. Systolic function was normal. Patient continues to equivocate  regarding surgery". Urine culture grew pan sensitive Escherichia coli. Patient has no complaints. We'll continue current management, and follow psychiatry/general surgery/cardiology recommendations. Patient will probably transition to skilled nursing facility at discharge. Plan Colonic mass likely malignant  Follow surgical recommendations  Consult cardiology for risk stratification in a.m. UTI (urinary tract infection)  Continue ceftriaxone to complete 7 days of antibiotics HTN (hypertension)/CAD (coronary artery disease)/Chronic systolic CHF (congestive heart failure), NYHA class 4/Anemia/Malnutrition of moderate degree  No acute changes  Continue current management Code Status: Full Code Family Communication: None at bedside. Disposition Plan: TBD  Consultants:  Psychiatry  General surgery  GI  Cardiology  Procedures:  CT abdomen pelvis  Antibiotics:  Ceftriaxone 10/22/2014>  HPI/Subjective: No specific complaints  Objective: Filed Vitals:   10/26/14 1320  BP: 152/68  Pulse: 74  Temp: 98.2 F (36.8 C)  Resp: 18    Intake/Output Summary (Last 24 hours) at 10/26/14 1837 Last data filed at 10/26/14 1203  Gross per 24 hour  Intake   2522 ml  Output      0 ml  Net   2522 ml   Filed Weights   10/23/14 0445  Weight: 50.848 kg (112 lb 1.6 oz)    Exam:   General:  Comfortable at rest.  Cardiovascular: S1-S2 normal. No murmurs. Pulse regular.  Respiratory: Good air entry bilaterally. No rhonchi or rales.  Abdomen: Soft and nontender. Normal bowel sounds. No organomegaly.  Musculoskeletal: Bilateral AKA   Neurological: No acute changes  Data Reviewed: Basic Metabolic Panel:  Recent Labs Lab 10/22/14 1707 10/23/14 0345 10/24/14 0450  NA 136 131* 130*  K 3.4* 3.9 3.5  CL 106 103 101  CO2 20* 20* 20*  GLUCOSE 96 83 94  BUN 21* 18 16  CREATININE 0.73 0.76 0.75  CALCIUM 9.3 8.9 8.8*   Liver Function Tests:  Recent Labs Lab  10/22/14  1707 10/23/14 0345 10/24/14 0450  AST 20 20 21   ALT 16* 14* 13*  ALKPHOS 157* 142* 140*  BILITOT 0.5 0.4 0.5  PROT 8.6* 7.9 7.9  ALBUMIN 3.4* 3.2* 3.1*    Recent Labs Lab 10/22/14 1707  LIPASE 30   No results for input(s): AMMONIA in the last 168 hours. CBC:  Recent Labs Lab 10/22/14 1707 10/22/14 2338 10/23/14 0345 10/24/14 0450  WBC 10.2  --  8.8 8.5  HGB 9.7*  --  9.4* 9.1*  HCT 29.9* 28.7* 29.9* 28.3*  MCV 77.5*  --  78.9 77.3*  PLT 416*  --  392 395   Cardiac Enzymes: No results for input(s): CKTOTAL, CKMB, CKMBINDEX, TROPONINI in the last 168 hours. BNP (last 3 results) No results for input(s): BNP in the last 8760 hours.  ProBNP (last 3 results) No results for input(s): PROBNP in the last 8760 hours.  CBG:  Recent Labs Lab 10/23/14 0751 10/24/14 0738 10/25/14 0722 10/26/14 0731  GLUCAP 98 105* 105* 110*    Recent Results (from the past 240 hour(s))  Urine culture     Status: None   Collection Time: 10/22/14  8:21 PM  Result Value Ref Range Status   Specimen Description URINE, CLEAN CATCH  Final   Special Requests Normal  Final   Culture   Final    >=100,000 COLONIES/mL ESCHERICHIA COLI Performed at [facility]    Report Status 10/25/2014 FINAL  Final   Organism ID, Bacteria ESCHERICHIA COLI  Final      Susceptibility   Escherichia coli - MIC*    AMPICILLIN <=2 SENSITIVE Sensitive     CEFAZOLIN <=4 SENSITIVE Sensitive     CEFTRIAXONE <=1 SENSITIVE Sensitive     CIPROFLOXACIN <=0.25 SENSITIVE Sensitive     GENTAMICIN <=1 SENSITIVE Sensitive     IMIPENEM <=0.25 SENSITIVE Sensitive     NITROFURANTOIN <=16 SENSITIVE Sensitive     TRIMETH/SULFA <=20 SENSITIVE Sensitive     AMPICILLIN/SULBACTAM <=2 SENSITIVE Sensitive     PIP/TAZO <=4 SENSITIVE Sensitive     * >=100,000 COLONIES/mL ESCHERICHIA COLI  Surgical pcr screen     Status: None   Collection Time: 10/22/14 11:13 PM  Result Value Ref Range Status   MRSA, PCR  NEGATIVE NEGATIVE Final   Staphylococcus aureus NEGATIVE NEGATIVE Final    Comment:        The Xpert SA Assay (FDA approved for NASAL specimens in patients over 91 years of age), is one component of a comprehensive surveillance program.  Test performance has been validated by [facility] for patients greater than or equal to 30 year old. It is not intended to diagnose infection nor to guide or monitor treatment.      Studies: No results found.  Scheduled Meds: . antiseptic oral rinse  7 mL Mouth Rinse q12n4p  . atorvastatin  40 mg Oral QPM  . carvedilol  6.25 mg Oral BID WC  . cefTRIAXone (ROCEPHIN)  IV  1 g Intravenous Q24H  . chlorhexidine  15 mL Mouth Rinse BID  . dicyclomine  20 mg Oral QHS  . escitalopram  5 mg Oral Daily  . feeding supplement (ENSURE ENLIVE)  237 mL Oral BID BM  . folic acid  1 mg Oral Daily  . mirtazapine  30 mg Oral QHS  . multivitamin with minerals  1 tablet Oral Daily  . sodium chloride  3 mL Intravenous Q12H  . thiamine  100 mg Oral Daily  . vitamin C  500 mg Oral Daily  . zinc sulfate  220 mg Oral Daily   Continuous Infusions:    Time spent: 15 minutes    Rodney [name]  [facility] Pager [phone]. If 7PM-7AM, please contact night-coverage at [email or web address], password [facility] 10/26/2014, 6:37 PM  LOS: 4 days

## 2014-10-26 NOTE — Progress Notes (Signed)
Patient ID: Rodney [name], male   DOB: 01-07-1946, 69 y.o.   MRN: [id number]    Subjective: Patient reports drinking some ensure yesterday.  He is passing gas and having some stools.    Objective: Vital signs in last 24 hours: Temp:  [97.9 F (36.6 C)-98.3 F (36.8 C)] 97.9 F (36.6 C) (07/25 0658) Pulse Rate:  [71-93] 93 (07/25 0658) Resp:  [16-18] 16 (07/25 0658) BP: (117-125)/(59-64) 125/63 mmHg (07/25 0658) SpO2:  [98 %-100 %] 100 % (07/25 0658) Last BM Date: 10/25/14  Intake/Output from previous day: 07/24 0701 - 07/25 0700 In: 1965 [P.O.:360; I.V.:1555; IV Piggyback:50] Out: 250 [Urine:250] Intake/Output this shift: Total I/O In: 437 [P.O.:437] Out: -   PE: General- In NAD Abdomen-soft, still distended, not tender  Lab Results:   Recent Labs  10/24/14 0450  WBC 8.5  HGB 9.1*  HCT 28.3*  PLT 395   BMET  Recent Labs  10/24/14 0450  NA 130*  K 3.5  CL 101  CO2 20*  GLUCOSE 94  BUN 16  CREATININE 0.75  CALCIUM 8.8*   PT/INR No results for input(s): LABPROT, INR in the last 72 hours. Comprehensive Metabolic Panel:    Component Value Date/Time   NA 130* 10/24/2014 0450   NA 131* 10/23/2014 0345   K 3.5 10/24/2014 0450   K 3.9 10/23/2014 0345   CL 101 10/24/2014 0450   CL 103 10/23/2014 0345   CO2 20* 10/24/2014 0450   CO2 20* 10/23/2014 0345   BUN 16 10/24/2014 0450   BUN 18 10/23/2014 0345   CREATININE 0.75 10/24/2014 0450   CREATININE 0.76 10/23/2014 0345   GLUCOSE 94 10/24/2014 0450   GLUCOSE 83 10/23/2014 0345   CALCIUM 8.8* 10/24/2014 0450   CALCIUM 8.9 10/23/2014 0345   AST 21 10/24/2014 0450   AST 20 10/23/2014 0345   ALT 13* 10/24/2014 0450   ALT 14* 10/23/2014 0345   ALKPHOS 140* 10/24/2014 0450   ALKPHOS 142* 10/23/2014 0345   BILITOT 0.5 10/24/2014 0450   BILITOT 0.4 10/23/2014 0345   PROT 7.9 10/24/2014 0450   PROT 7.9 10/23/2014 0345   ALBUMIN 3.1* 10/24/2014 0450   ALBUMIN 3.2* 10/23/2014 0345     Studies/Results: No  results found.  Anti-infectives: Anti-infectives    Start     Dose/Rate Route Frequency Ordered Stop   10/23/14 2200  cefTRIAXone (ROCEPHIN) 1 g in dextrose 5 % 50 mL IVPB     1 g 100 mL/hr over 30 Minutes Intravenous Every 24 hours 10/22/14 2121     10/22/14 2030  cefTRIAXone (ROCEPHIN) 1 g in dextrose 5 % 50 mL IVPB     1 g 100 mL/hr over 30 Minutes Intravenous  Once 10/22/14 2017 10/22/14 2145      Assessment High grade obstructing colonic lesion with liver lesions and peritoneal nodules all clinically consistent with metastatic colon cancer-palliative partial colectomy and/or colostomy has been recommended to him.  No emergent need for surgery.   Continue to await psych and cardiology input.   Pt not great operative candidate, but if he would at least be candidate for laparoscopic diverting ostomy as long as abdomen is not frozen from carcinomatosis if agreeable.  Pt does seem to understand superficial review of situation.    Has been refusing surgery due to mistrust of doctors.  If he does go to surgery, it would be helpful to see if he could get gentle prep and possible evaluation of the remaining colon.  [facility] 10/26/2014

## 2014-10-27 ENCOUNTER — Inpatient Hospital Stay (HOSPITAL_COMMUNITY): Payer: Medicare Other | Admitting: Anesthesiology

## 2014-10-27 ENCOUNTER — Encounter (HOSPITAL_COMMUNITY)
Admission: EM | Disposition: A | Discharge status: Skilled Nursing Facility | Payer: Self-pay | Source: Home / Self Care | Attending: Internal Medicine

## 2014-10-27 DIAGNOSIS — K6389 Other specified diseases of intestine: Secondary | ICD-10-CM

## 2014-10-27 HISTORY — PX: COLON RESECTION: SHX5231

## 2014-10-27 LAB — BASIC METABOLIC PANEL WITH GFR
Anion gap: 11 (ref 5–15)
BUN: 20 mg/dL (ref 6–20)
CO2: 24 mmol/L (ref 22–32)
Calcium: 9.1 mg/dL (ref 8.9–10.3)
Chloride: 98 mmol/L — ABNORMAL LOW (ref 101–111)
Creatinine, Ser: 1.04 mg/dL (ref 0.61–1.24)
GFR calc Af Amer: >60 mL/min
GFR calc non Af Amer: >60 mL/min
Glucose, Bld: 127 mg/dL — ABNORMAL HIGH (ref 65–99)
Potassium: 3.7 mmol/L (ref 3.5–5.1)
Sodium: 133 mmol/L — ABNORMAL LOW (ref 135–145)

## 2014-10-27 LAB — CBC
HCT: 29.8 % — ABNORMAL LOW (ref 39.0–52.0)
Hemoglobin: 9.5 g/dL — ABNORMAL LOW (ref 13.0–17.0)
MCH: 24.9 pg — ABNORMAL LOW (ref 26.0–34.0)
MCHC: 31.9 g/dL (ref 30.0–36.0)
MCV: 78.2 fL (ref 78.0–100.0)
Platelets: 383 10*3/uL (ref 150–400)
RBC: 3.81 MIL/uL — ABNORMAL LOW (ref 4.22–5.81)
RDW: 16.9 % — ABNORMAL HIGH (ref 11.5–15.5)
WBC: 7.1 10*3/uL (ref 4.0–10.5)

## 2014-10-27 LAB — GLUCOSE, CAPILLARY: Glucose-Capillary: 128 mg/dL — ABNORMAL HIGH (ref 65–99)

## 2014-10-27 LAB — ABO/RH: ABO/RH(D): O POS

## 2014-10-27 SURGERY — COLON RESECTION LAPAROSCOPIC
Anesthesia: General

## 2014-10-27 MED ORDER — METRONIDAZOLE IN NACL 5-0.79 MG/ML-% IV SOLN
500.0000 mg | Freq: Three times a day (TID) | INTRAVENOUS | Status: AC
  Administered 2014-10-27: 500 mg via INTRAVENOUS
  Filled 2014-10-27: qty 100

## 2014-10-27 MED ORDER — SODIUM CHLORIDE 0.9 % IV SOLN
INTRAVENOUS | Status: DC
  Administered 2014-10-27 – 2014-10-29 (×3): via INTRAVENOUS
  Filled 2014-10-27 (×7): qty 1000

## 2014-10-27 MED ORDER — MORPHINE SULFATE 1 MG/ML IV SOLN
INTRAVENOUS | Status: DC
  Administered 2014-10-27: 17:00:00 via INTRAVENOUS
  Administered 2014-10-27: 11 mg via INTRAVENOUS
  Administered 2014-10-28: 9 mg via INTRAVENOUS
  Administered 2014-10-28: 13 mg via INTRAVENOUS
  Administered 2014-10-28 (×2): 3 mg via INTRAVENOUS
  Filled 2014-10-27 (×2): qty 25

## 2014-10-27 MED ORDER — PHENYLEPHRINE HCL 10 MG/ML IJ SOLN
INTRAMUSCULAR | Status: DC | PRN
  Administered 2014-10-27 (×2): 80 ug via INTRAVENOUS

## 2014-10-27 MED ORDER — 0.9 % SODIUM CHLORIDE (POUR BTL) OPTIME
TOPICAL | Status: DC | PRN
  Administered 2014-10-27: 3000 mL

## 2014-10-27 MED ORDER — PROPOFOL 10 MG/ML IV BOLUS
INTRAVENOUS | Status: DC | PRN
  Administered 2014-10-27: 100 mg via INTRAVENOUS

## 2014-10-27 MED ORDER — ROCURONIUM BROMIDE 100 MG/10ML IV SOLN
INTRAVENOUS | Status: AC
  Filled 2014-10-27: qty 1

## 2014-10-27 MED ORDER — BUPIVACAINE-EPINEPHRINE (PF) 0.25% -1:200000 IJ SOLN
INTRAMUSCULAR | Status: AC
  Filled 2014-10-27: qty 30

## 2014-10-27 MED ORDER — SODIUM CHLORIDE 0.9 % IV SOLN
INTRAVENOUS | Status: DC | PRN
  Administered 2014-10-27: 12:00:00 via INTRAVENOUS

## 2014-10-27 MED ORDER — ONDANSETRON HCL 4 MG/2ML IJ SOLN: 4.0000 mg | Freq: Four times a day (QID) | INTRAMUSCULAR | Status: DC | PRN

## 2014-10-27 MED ORDER — PROMETHAZINE HCL 25 MG/ML IJ SOLN: 6.2500 mg | INTRAMUSCULAR | Status: DC | PRN

## 2014-10-27 MED ORDER — NEOSTIGMINE METHYLSULFATE 10 MG/10ML IV SOLN
INTRAVENOUS | Status: DC | PRN
  Administered 2014-10-27: 4 mg via INTRAVENOUS

## 2014-10-27 MED ORDER — FENTANYL CITRATE (PF) 100 MCG/2ML IJ SOLN: 25.0000 ug | INTRAMUSCULAR | Status: DC | PRN

## 2014-10-27 MED ORDER — LACTATED RINGERS IV SOLN
INTRAVENOUS | Status: DC | PRN
  Administered 2014-10-27 (×2): via INTRAVENOUS

## 2014-10-27 MED ORDER — GLYCOPYRROLATE 0.2 MG/ML IJ SOLN
INTRAMUSCULAR | Status: DC | PRN
  Administered 2014-10-27: 0.6 mg via INTRAVENOUS

## 2014-10-27 MED ORDER — FENTANYL CITRATE (PF) 250 MCG/5ML IJ SOLN
INTRAMUSCULAR | Status: DC | PRN
  Administered 2014-10-27 (×4): 25 ug via INTRAVENOUS
  Administered 2014-10-27: 50 ug via INTRAVENOUS

## 2014-10-27 MED ORDER — LIDOCAINE HCL 1 % IJ SOLN
INTRAMUSCULAR | Status: AC
  Filled 2014-10-27: qty 20

## 2014-10-27 MED ORDER — PHENYLEPHRINE HCL 10 MG/ML IJ SOLN
INTRAMUSCULAR | Status: AC
  Filled 2014-10-27: qty 1

## 2014-10-27 MED ORDER — ONDANSETRON HCL 4 MG/2ML IJ SOLN
INTRAMUSCULAR | Status: AC
  Filled 2014-10-27: qty 2

## 2014-10-27 MED ORDER — NALOXONE HCL 0.4 MG/ML IJ SOLN: 0.4000 mg | INTRAMUSCULAR | Status: DC | PRN

## 2014-10-27 MED ORDER — DIPHENHYDRAMINE HCL 50 MG/ML IJ SOLN: 12.5000 mg | Freq: Four times a day (QID) | INTRAMUSCULAR | Status: DC | PRN

## 2014-10-27 MED ORDER — SODIUM CHLORIDE 0.9 % IJ SOLN: 9.0000 mL | INTRAMUSCULAR | Status: DC | PRN

## 2014-10-27 MED ORDER — METRONIDAZOLE IN NACL 5-0.79 MG/ML-% IV SOLN
500.0000 mg | INTRAVENOUS | Status: AC
  Administered 2014-10-27: 500 mg via INTRAVENOUS

## 2014-10-27 MED ORDER — FENTANYL CITRATE (PF) 100 MCG/2ML IJ SOLN
INTRAMUSCULAR | Status: AC
  Filled 2014-10-27: qty 2

## 2014-10-27 MED ORDER — PROPOFOL 10 MG/ML IV BOLUS
INTRAVENOUS | Status: AC
  Filled 2014-10-27: qty 20

## 2014-10-27 MED ORDER — SUCCINYLCHOLINE CHLORIDE 20 MG/ML IJ SOLN
INTRAMUSCULAR | Status: DC | PRN
  Administered 2014-10-27: 80 mg via INTRAVENOUS

## 2014-10-27 MED ORDER — LACTATED RINGERS IR SOLN
Status: DC | PRN
  Administered 2014-10-27: 1

## 2014-10-27 MED ORDER — DIPHENHYDRAMINE HCL 12.5 MG/5ML PO ELIX: 12.5000 mg | ORAL_SOLUTION | Freq: Four times a day (QID) | ORAL | Status: DC | PRN

## 2014-10-27 MED ORDER — BUPIVACAINE-EPINEPHRINE 0.25% -1:200000 IJ SOLN
INTRAMUSCULAR | Status: DC | PRN
  Administered 2014-10-27: 12 mL

## 2014-10-27 MED ORDER — METRONIDAZOLE IN NACL 5-0.79 MG/ML-% IV SOLN
INTRAVENOUS | Status: AC
  Filled 2014-10-27: qty 100

## 2014-10-27 MED ORDER — MEPERIDINE HCL 50 MG/ML IJ SOLN: 6.2500 mg | INTRAMUSCULAR | Status: DC | PRN

## 2014-10-27 MED ORDER — ROCURONIUM BROMIDE 100 MG/10ML IV SOLN
INTRAVENOUS | Status: DC | PRN
  Administered 2014-10-27 (×3): 10 mg via INTRAVENOUS
  Administered 2014-10-27: 5 mg via INTRAVENOUS
  Administered 2014-10-27: 20 mg via INTRAVENOUS

## 2014-10-27 MED ORDER — ONDANSETRON HCL 4 MG/2ML IJ SOLN
INTRAMUSCULAR | Status: DC | PRN
  Administered 2014-10-27: 4 mg via INTRAVENOUS

## 2014-10-27 SURGICAL SUPPLY — 79 items
APPLIER CLIP 5 13 M/L LIGAMAX5 (MISCELLANEOUS)
APPLIER CLIP ROT 10 11.4 M/L (STAPLE) ×2
BLADE EXTENDED COATED 6.5IN (ELECTRODE) ×2 IMPLANT
BLADE HEX COATED 2.75 (ELECTRODE) ×4 IMPLANT
CABLE HIGH FREQUENCY MONO STRZ (ELECTRODE) ×2 IMPLANT
CATH KIT ON-Q SILVERSOAK 7.5IN (CATHETERS) IMPLANT
CELLS DAT CNTRL 66122 CELL SVR (MISCELLANEOUS) IMPLANT
CLIP APPLIE 5 13 M/L LIGAMAX5 (MISCELLANEOUS) IMPLANT
CLIP APPLIE ROT 10 11.4 M/L (STAPLE) ×1 IMPLANT
COUNTER NEEDLE 20 DBL MAG RED (NEEDLE) ×2 IMPLANT
COVER SURGICAL LIGHT HANDLE (MISCELLANEOUS) ×2 IMPLANT
DECANTER SPIKE VIAL GLASS SM (MISCELLANEOUS) ×2 IMPLANT
DRAIN CHANNEL 19F RND (DRAIN) IMPLANT
DRAPE LAPAROSCOPIC ABDOMINAL (DRAPES) ×2 IMPLANT
DRAPE UTILITY XL STRL (DRAPES) ×6 IMPLANT
DRESSING TELFA ISLAND 4X8 (GAUZE/BANDAGES/DRESSINGS) ×2 IMPLANT
DRSG OPSITE POSTOP 4X10 (GAUZE/BANDAGES/DRESSINGS) IMPLANT
DRSG OPSITE POSTOP 4X6 (GAUZE/BANDAGES/DRESSINGS) ×2 IMPLANT
DRSG OPSITE POSTOP 4X8 (GAUZE/BANDAGES/DRESSINGS) IMPLANT
DRSG TEGADERM 2-3/8X2-3/4 SM (GAUZE/BANDAGES/DRESSINGS) ×2 IMPLANT
DRSG TEGADERM 4X4.75 (GAUZE/BANDAGES/DRESSINGS) ×2 IMPLANT
ELECT REM PT RETURN 9FT ADLT (ELECTROSURGICAL) ×2
ELECTRODE REM PT RTRN 9FT ADLT (ELECTROSURGICAL) ×1 IMPLANT
ENDOLOOP SUT PDS II  0 18 (SUTURE)
ENDOLOOP SUT PDS II 0 18 (SUTURE) IMPLANT
GAUZE SPONGE 4X4 12PLY STRL (GAUZE/BANDAGES/DRESSINGS) ×2 IMPLANT
GLOVE BIO SURGEON STRL SZ 6 (GLOVE) ×4 IMPLANT
GLOVE INDICATOR 6.5 STRL GRN (GLOVE) ×4 IMPLANT
GOWN STRL REUS W/ TWL LRG LVL4 (GOWN DISPOSABLE) ×1 IMPLANT
GOWN STRL REUS W/TWL 2XL LVL3 (GOWN DISPOSABLE) ×4 IMPLANT
GOWN STRL REUS W/TWL LRG LVL4 (GOWN DISPOSABLE) ×1
GOWN STRL REUS W/TWL XL LVL3 (GOWN DISPOSABLE) ×8 IMPLANT
LEGGING LITHOTOMY PAIR STRL (DRAPES) ×2 IMPLANT
LIGASURE IMPACT 36 18CM CVD LR (INSTRUMENTS) ×2 IMPLANT
LUBRICANT JELLY K Y 4OZ (MISCELLANEOUS) IMPLANT
PACK COLON (CUSTOM PROCEDURE TRAY) ×2 IMPLANT
RELOAD PROXIMATE 75MM BLUE (ENDOMECHANICALS) ×2 IMPLANT
RTRCTR WOUND ALEXIS 18CM MED (MISCELLANEOUS)
SCISSORS LAP 5X35 DISP (ENDOMECHANICALS) ×2 IMPLANT
SEALER TISSUE G2 CVD JAW 35 (ENDOMECHANICALS) IMPLANT
SEALER TISSUE G2 CVD JAW 45CM (ENDOMECHANICALS)
SEALER TISSUE G2 STRG ARTC 35C (ENDOMECHANICALS) ×2 IMPLANT
SET IRRIG TUBING LAPAROSCOPIC (IRRIGATION / IRRIGATOR) ×2 IMPLANT
SHEARS HARMONIC ACE PLUS 36CM (ENDOMECHANICALS) IMPLANT
SLEEVE SURGEON STRL (DRAPES) IMPLANT
SLEEVE XCEL OPT CAN 5 100 (ENDOMECHANICALS) ×6 IMPLANT
SOLUTION ANTI FOG 6CC (MISCELLANEOUS) ×2 IMPLANT
STAPLER PROXIMATE 75MM BLUE (STAPLE) ×2 IMPLANT
STAPLER VISISTAT 35W (STAPLE) ×2 IMPLANT
SUCTION POOLE TIP (SUCTIONS) ×2 IMPLANT
SUT MNCRL AB 4-0 PS2 18 (SUTURE) ×2 IMPLANT
SUT PDS AB 0 CTX 60 (SUTURE) ×4 IMPLANT
SUT PDS AB 1 CTX 36 (SUTURE) IMPLANT
SUT PDS AB 1 TP1 96 (SUTURE) IMPLANT
SUT PROLENE 2 0 KS (SUTURE) IMPLANT
SUT SILK 2 0 (SUTURE)
SUT SILK 2 0 SH CR/8 (SUTURE) IMPLANT
SUT SILK 2-0 18XBRD TIE 12 (SUTURE) IMPLANT
SUT SILK 3 0 (SUTURE)
SUT SILK 3 0 SH CR/8 (SUTURE) IMPLANT
SUT SILK 3-0 18XBRD TIE 12 (SUTURE) IMPLANT
SUT VIC AB 2-0 SH 18 (SUTURE) ×2 IMPLANT
SUT VIC AB 3-0 SH 18 (SUTURE) ×2 IMPLANT
SUT VIC AB 3-0 SH 8-18 (SUTURE) ×4 IMPLANT
SUT VICRYL 2 0 18  UND BR (SUTURE) ×1
SUT VICRYL 2 0 18 UND BR (SUTURE) ×1 IMPLANT
SUT VICRYL 3 0 BR 18  UND (SUTURE) ×1
SUT VICRYL 3 0 BR 18 UND (SUTURE) ×1 IMPLANT
SYS LAPSCP GELPORT 120MM (MISCELLANEOUS)
SYSTEM LAPSCP GELPORT 120MM (MISCELLANEOUS) IMPLANT
TOWEL OR NON WOVEN STRL DISP B (DISPOSABLE) ×4 IMPLANT
TRAY FOLEY W/METER SILVER 14FR (SET/KITS/TRAYS/PACK) ×2 IMPLANT
TROCAR BLADELESS OPT 5 100 (ENDOMECHANICALS) ×2 IMPLANT
TROCAR XCEL 12X100 BLDLESS (ENDOMECHANICALS) ×2 IMPLANT
TROCAR XCEL BLUNT TIP 100MML (ENDOMECHANICALS) IMPLANT
TROCAR XCEL NON-BLD 11X100MML (ENDOMECHANICALS) IMPLANT
TUBING CONNECTING 10 (TUBING) IMPLANT
TUBING FILTER THERMOFLATOR (ELECTROSURGICAL) ×2 IMPLANT
TUNNELER SHEATH ON-Q 16GX12 DP (PAIN MANAGEMENT) IMPLANT

## 2014-10-27 NOTE — Consult Note (Signed)
WOC requested for preoperative stoma site marking   Examined patient lying in the preoperative holding area, as the request was just made earlier this am,  in order to place the marking in the patient's visual field, away from any creases or abdominal contour issues and within the rectus muscle.  Attempted to mark below the patient's belt line. Patient is quite distended and tender, it was not possible to feel the rectus muscle.    Marked for colostomy in the LLQ  __5__ cm to the left of the umbilicus and _8___MN below the umbilicus, made an additional marking on the patients RUQ for higher colostomy based on the uncertainty of the amount of bowel that will have to be removed.  Additional mark 4 cm to the right of the umbilicus and 3 cm above the umbilicus.   [facility] team will follow up with patient after surgery for continue ostomy care and teaching.  Adalyn Pennock [place] RN,CWOCN [phone]

## 2014-10-27 NOTE — Progress Notes (Signed)
Clinical Social Work  Psych consulted to determine capacity. Patient was deemed to have capacity per psych MD. Psych CSW signing off and unit CSW will assist with DC plans.  [place], [place] [phone]

## 2014-10-27 NOTE — Transfer of Care (Signed)
Immediate Anesthesia Transfer of Care Note  Patient: Rodney [name]  Procedure(s) Performed: Procedure(s): DIAGNOSTIC LAPAROSCOPY RIGHT HEMICOLECTOMY WITH MUCOUS FISTULA (N/A)  Patient Location: PACU  Anesthesia Type:General  Level of Consciousness: awake, alert , oriented and patient cooperative  Airway & Oxygen Therapy: Patient Spontanous Breathing and Patient connected to face mask oxygen  Post-op Assessment: Report given to RN and Post -op Vital signs reviewed and stable  Post vital signs: Reviewed and stable  Last Vitals:  Filed Vitals:   10/27/14 0507  BP: 109/58  Pulse: 85  Temp: 36.7 C  Resp: 16    Complications: No apparent anesthesia complications

## 2014-10-27 NOTE — Op Note (Signed)
PRE-OPERATIVE DIAGNOSIS: obstructing colon cancer with peritoneal carcinomatosis.    POST-OPERATIVE DIAGNOSIS:  Same  PROCEDURE:  Procedure(s): Diagnostic laparoscopy, right hemicolectomy, end ileostomy, mucous fistula, cholecystectomy  SURGEON:  Surgeon(s): Stark Klein, MD  ANESTHESIA:   local and general  DRAINS: none   LOCAL MEDICATIONS USED:  BUPIVICAINE  and LIDOCAINE   SPECIMEN:  Source of Specimen:  right colon with appendix, omentum, gallbladder  DISPOSITION OF SPECIMEN:  PATHOLOGY  COUNTS:  YES  DICTATION: .Dragon Dictation  PLAN OF CARE: Admit to inpatient   PATIENT DISPOSITION:  PACU - hemodynamically stable.  FINDINGS:  Microperforations of cecum with stretched out taenia.  Firm mass at appendix.  Large mass on diaphragm over right lobe of liver.  Dilated transverse colon.    EBL: 100 mL  PROCEDURE:   Patient was identified in the holding area and taken to the operating room where he was placed supine.  General anesthesia was induced.  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 A Foley catheter was placed. His abdomen was prepped and draped in sterile fashion. Timeout was performed according to the surgical safety checklist. When all was correct, we continued. The patient was placed into reverse Trendelenburg position and rotated to the right.   A 5 mm Optiview trocar was placed under direct visualization in the left upper quadrant. Pneumoperitoneum was achieved to a pressure of 15 mmHg. The patient was placed back flat with the left side down. 2 trochars were placed in the right abdomen. The colon was noted to be quite distended, but the small bowel was reasonably decompressed. The omentum was taken off of the transverse colon. The descending colon was mobilized a bit. The patient's white line of Toldt was very posterior in the abdomen.  There are multiple implants along the abdominal wall. The transverse colon was  quite redundant. Decision was made to go ahead and make a site for an ostomy and mucous fistula in the right abdomen. A ostomy site was created. The colon would not mobilize adequately and it was noted that there was feculent odor from the abdomen. A small upper midline incision was made and there were 2 small punctate areas on the cecum that appeared to be about to perforate. The taenia was also very stretched out.  There was a small amount of stool that came from the punctate areas while mobilizing the colon.  The cecum was at least 12 cm wide.    The terminal ileum was divided with the GIA 75. The transverse colon was divided with the GIA-75. The colon was then mobilized with the LigaSure. The gallbladder was noted to be adherent to the colon. This was taken as well. The appendix had multiple masses are not and lysed to the posterior abdomen. This was also mobilized and the mesentery divided with the LigaSure. The terminal ileum was pulled through the site for the ostomy and the and the end of the transverse colon that have been stapled off was pulled up to the ostomy site to make a mucous fistula. The abdomen was copiously irrigated.  The bovie, suction, and outer drapes were changed. The fascia was then closed using running 0 looped PDS suture. The skin was loosely pulled together with staples with Telfa wicks. The end ileostomy was then matured with the mucous fistula on the medial aspect. The mucous fistula was aspirated with significant amounts of gas and feculent material. The ostomy appliance was then placed over the  ostomy. The laparoscopic port sites were closed with staples. The patient had severe extubated and taken to the recovery room in stable condition. Needle, sponge, and instrument counts were correct 2.

## 2014-10-27 NOTE — Anesthesia Postprocedure Evaluation (Signed)
  Anesthesia Post-op Note  Patient: Rodney [name]  Procedure(s) Performed: Procedure(s) (LRB): DIAGNOSTIC LAPAROSCOPY RIGHT HEMICOLECTOMY WITH MUCOUS FISTULA (N/A)  Patient Location: PACU  Anesthesia Type: General  Level of Consciousness: awake and alert   Airway and Oxygen Therapy: Patient Spontanous Breathing  Post-op Pain: mild  Post-op Assessment: Post-op Vital signs reviewed, Patient's Cardiovascular Status Stable, Respiratory Function Stable, Patent Airway and No signs of Nausea or vomiting  Last Vitals:  Filed Vitals:   10/27/14 1637  BP:   Pulse:   Temp:   Resp: 16    Post-op Vital Signs: stable   Complications: No apparent anesthesia complications

## 2014-10-27 NOTE — Anesthesia Preprocedure Evaluation (Addendum)
Anesthesia Evaluation  Patient identified by MRN, date of birth, ID band Patient awake    Reviewed: Allergy & Precautions, H&P , NPO status , Patient's Chart, lab work & pertinent test results  Airway Mallampati: II  TM Distance: >3 FB Neck ROM: Full    Dental no notable dental hx. (+) Poor Dentition   Pulmonary Current Smoker,  breath sounds clear to auscultation  Pulmonary exam normal       Cardiovascular hypertension, + CAD, + Peripheral Vascular Disease and +CHF Normal cardiovascular examRhythm:Regular Rate:Normal     Neuro/Psych PSYCHIATRIC DISORDERS negative neurological ROS  negative psych ROS   GI/Hepatic negative GI ROS, Neg liver ROS,   Endo/Other  negative endocrine ROS  Renal/GU Renal disease  negative genitourinary   Musculoskeletal negative musculoskeletal ROS (+)   Abdominal   Peds negative pediatric ROS (+)  Hematology negative hematology ROS (+) anemia ,   Anesthesia Other Findings   Reproductive/Obstetrics negative OB ROS                            Anesthesia Physical  Anesthesia Plan  ASA: IV  Anesthesia Plan: General   Post-op Pain Management:    Induction: Intravenous, Rapid sequence and Cricoid pressure planned  Airway Management Planned: Oral ETT  Additional Equipment:   Intra-op Plan:   Post-operative Plan: Extubation in OR  Informed Consent: I have reviewed the patients History and Physical, chart, labs and discussed the procedure including the risks, benefits and alternatives for the proposed anesthesia with the patient or authorized representative who has indicated his/her understanding and acceptance.   Dental advisory given  Plan Discussed with: CRNA  Anesthesia Plan Comments:        Anesthesia Quick Evaluation

## 2014-10-27 NOTE — Progress Notes (Signed)
[facility] PROGRESS NOTE  Nolon Yellin DGU:[id number] DOB: 12/18/1945 DOA: 10/22/2014 PCP: [name], NP  Summary 10/23/14: I have seen and examined Mr. Lebron Quam at bedside and reviewed his chart. Appreciate general surgery/psychiatrist/GI. Armanii Pressnell is a 69 y.o. male with multiple medical problems who presents with abdominal pain, and he was found to have UTI/colonic mass concerning for malignancy, with CT abdomen and pelvis showing "Distal transverse colon obstructing mass most compatible with malignancy. Further evaluation with colonoscopy and tissue sampling recommended. There is high-grade stenosis and dilatation of the colon proximal to this mass. Peritoneal, mesenteric, and hepatic metastatic disease. No definite CT evidence of acute appendicitis". Patient has been offered surgical intervention but he refuses and has been deemed to have mental capacity to make decisions regarding his medical care. He understands he may die without acute intervention. He requests food. We'll therefore respect his wishes, start feeds and continued to deliberate with him going forward. 10/24/2014: Appreciate general surgery. Patient sent to be changing his mind regarding surgery. Will consult cardiology for risk stratification in a.m. 10/25/2014: Patient more open to surgery. He denies any complaints. Will obtain 2-D echocardiogram to assess ejection fraction and consult cardiology. 10/26/2014: Appreciate general surgery/cardiology/psychiatry. 2-D echocardiogram showed "- Left ventricle: Hypokinesis of inferolateral wall and base inferior segment. The cavity size was normal. Wall thickness was increased in a pattern of mild LVH. The estimated ejection fraction was 50%. - Aortic valve: Moderately severe aortic regurgitation. Sclerosis without stenosis. - Left atrium: The atrium was moderately dilated. - Right ventricle: The cavity size was normal. Systolic function was normal. Patient continues to equivocate  regarding surgery". Urine culture grew pan sensitive Escherichia coli. Patient has no complaints. We'll continue current management, and follow psychiatry/general surgery/cardiology recommendations. Patient will probably transition to skilled nursing facility at discharge. 10/27/2014: Appreciate general surgery/cardiology set psychiatry. Patient status post "Diagnostic laparoscopy, right hemicolectomy, end ileostomy, mucous fistula, cholecystectomy" by Dr. Barry Dienes today. This is palliative intervention. Patient will likely transfer to skilled nursing facility at discharge-he may not be able to manage at assisted living facility. Discussed with the interdisciplinary team today. Patient denies any complaints. Will continue ceftriaxone to complete course of antibiotics for Escherichia coli UTI Plan Colonic mass likely malignant now status post Diagnostic laparoscopy, right hemicolectomy, end ileostomy, mucous fistula, cholecystectomy post op Day 0.  Defer to gen surgery. E Coli UTI (urinary tract infection)  Seems to be responding to antibiotics  Continue ceftriaxone to complete 7 days of antibiotics- Day 6/7 HTN (hypertension)/CAD (coronary artery disease)/Chronic systolic CHF (congestive heart failure), NYHA class 4/Anemia/Malnutrition of moderate degree  No acute changes  Continue current management Code Status: Full Code Family Communication: None at bedside. Disposition Plan: TBD  Consultants:  Psychiatry  General surgery  GI  Cardiology  Procedures:  CT abdomen pelvis  Antibiotics:  Ceftriaxone 10/22/2014>  HPI/Subjective: No specific complaints  Objective: Filed Vitals:   10/27/14 1637  BP:   Pulse:   Temp:   Resp: 16    Intake/Output Summary (Last 24 hours) at 10/27/14 1941 Last data filed at 10/27/14 1540  Gross per 24 hour  Intake   2330 ml  Output    380 ml  Net   1950 ml   Filed Weights   10/23/14 0445  Weight: 50.848 kg (112 lb 1.6 oz)     Exam:   General:  Comfortable at rest.  Cardiovascular: S1-S2 normal. No murmurs. Pulse regular.  Respiratory: Good air entry bilaterally. No rhonchi or rales.  Abdomen: Soft  and nontender. Colostomy in place. No bleed.  Musculoskeletal: No acute changes   Neurological: Intact  Data Reviewed: Basic Metabolic Panel:  Recent Labs Lab 10/22/14 1707 10/23/14 0345 10/24/14 0450 10/27/14 0428  NA 136 131* 130* 133*  K 3.4* 3.9 3.5 3.7  CL 106 103 101 98*  CO2 20* 20* 20* 24  GLUCOSE 96 83 94 127*  BUN 21* 18 16 20   CREATININE 0.73 0.76 0.75 1.04  CALCIUM 9.3 8.9 8.8* 9.1   Liver Function Tests:  Recent Labs Lab 10/22/14 1707 10/23/14 0345 10/24/14 0450  AST 20 20 21   ALT 16* 14* 13*  ALKPHOS 157* 142* 140*  BILITOT 0.5 0.4 0.5  PROT 8.6* 7.9 7.9  ALBUMIN 3.4* 3.2* 3.1*    Recent Labs Lab 10/22/14 1707  LIPASE 30   No results for input(s): AMMONIA in the last 168 hours. CBC:  Recent Labs Lab 10/22/14 1707 10/22/14 2338 10/23/14 0345 10/24/14 0450 10/27/14 0428  WBC 10.2  --  8.8 8.5 7.1  HGB 9.7*  --  9.4* 9.1* 9.5*  HCT 29.9* 28.7* 29.9* 28.3* 29.8*  MCV 77.5*  --  78.9 77.3* 78.2  PLT 416*  --  392 395 383   Cardiac Enzymes: No results for input(s): CKTOTAL, CKMB, CKMBINDEX, TROPONINI in the last 168 hours. BNP (last 3 results) No results for input(s): BNP in the last 8760 hours.  ProBNP (last 3 results) No results for input(s): PROBNP in the last 8760 hours.  CBG:  Recent Labs Lab 10/23/14 0751 10/24/14 0738 10/25/14 0722 10/26/14 0731 10/27/14 0733  GLUCAP 98 105* 105* 110* 128*    Recent Results (from the past 240 hour(s))  Urine culture     Status: None   Collection Time: 10/22/14  8:21 PM  Result Value Ref Range Status   Specimen Description URINE, CLEAN CATCH  Final   Special Requests Normal  Final   Culture   Final    >=100,000 COLONIES/mL ESCHERICHIA COLI Performed at [facility]    Report Status  10/25/2014 FINAL  Final   Organism ID, Bacteria ESCHERICHIA COLI  Final      Susceptibility   Escherichia coli - MIC*    AMPICILLIN <=2 SENSITIVE Sensitive     CEFAZOLIN <=4 SENSITIVE Sensitive     CEFTRIAXONE <=1 SENSITIVE Sensitive     CIPROFLOXACIN <=0.25 SENSITIVE Sensitive     GENTAMICIN <=1 SENSITIVE Sensitive     IMIPENEM <=0.25 SENSITIVE Sensitive     NITROFURANTOIN <=16 SENSITIVE Sensitive     TRIMETH/SULFA <=20 SENSITIVE Sensitive     AMPICILLIN/SULBACTAM <=2 SENSITIVE Sensitive     PIP/TAZO <=4 SENSITIVE Sensitive     * >=100,000 COLONIES/mL ESCHERICHIA COLI  Surgical pcr screen     Status: None   Collection Time: 10/22/14 11:13 PM  Result Value Ref Range Status   MRSA, PCR NEGATIVE NEGATIVE Final   Staphylococcus aureus NEGATIVE NEGATIVE Final    Comment:        The Xpert SA Assay (FDA approved for NASAL specimens in patients over 40 years of age), is one component of a comprehensive surveillance program.  Test performance has been validated by [facility] for patients greater than or equal to 42 year old. It is not intended to diagnose infection nor to guide or monitor treatment.      Studies: No results found.  Scheduled Meds: . antiseptic oral rinse  7 mL Mouth Rinse q12n4p  . atorvastatin  40 mg Oral QPM  . carvedilol  6.25 mg Oral BID WC  . cefTRIAXone (ROCEPHIN)  IV  1 g Intravenous Q24H  . chlorhexidine  15 mL Mouth Rinse BID  . dicyclomine  20 mg Oral QHS  . escitalopram  5 mg Oral Daily  . folic acid  1 mg Oral Daily  . mirtazapine  30 mg Oral QHS  . morphine   Intravenous 6 times per day  . multivitamin with minerals  1 tablet Oral Daily  . sodium chloride  3 mL Intravenous Q12H  . thiamine  100 mg Oral Daily  . vitamin C  500 mg Oral Daily  . zinc sulfate  220 mg Oral Daily   Continuous Infusions: . sodium chloride 0.9 % 1,000 mL with potassium chloride 20 mEq infusion 100 mL/hr at 10/27/14 1636     Time spent: 25  minutes    Sylvanna Burggraf  [facility] Pager [phone]. If 7PM-7AM, please contact night-coverage at [email or web address], password [facility] 10/27/2014, 7:41 PM  LOS: 5 days

## 2014-10-27 NOTE — Progress Notes (Deleted)
Order for Albuterol 10 mg via nebulizer, 4 packages of 2.5mg  ,  verified by Pharmacist.

## 2014-10-27 NOTE — Anesthesia Procedure Notes (Addendum)
Procedure Name: Intubation Date/Time: 10/27/2014 12:24 PM Performed by: Dione Booze Pre-anesthesia Checklist: Emergency Drugs available, Suction available, Patient being monitored and Patient identified Patient Re-evaluated:Patient Re-evaluated prior to inductionOxygen Delivery Method: Circle system utilized Preoxygenation: Pre-oxygenation with 100% oxygen Intubation Type: Cricoid Pressure applied, Rapid sequence and IV induction Laryngoscope Size: Mac and 4 Grade View: Grade I Tube type: Subglottic suction tube Tube size: 7.5 mm Number of attempts: 1 Airway Equipment and Method: Stylet Placement Confirmation: ETT inserted through vocal cords under direct vision Secured at: 18 cm Tube secured with: Tape Dental Injury: Teeth and Oropharynx as per pre-operative assessment

## 2014-10-27 NOTE — Progress Notes (Signed)
Patient ID: Rodney [name], male   DOB: 1945-08-11, 69 y.o.   MRN: [id number]    Subjective: Patient does feel well this morning.  He feels full and has increased abdominal discomfort.  He denies nausea though.  Objective: Vital signs in last 24 hours: Temp:  [98 F (36.7 C)-99.1 F (37.3 C)] 98 F (36.7 C) (07/26 0507) Pulse Rate:  [74-97] 85 (07/26 0507) Resp:  [16-18] 16 (07/26 0507) BP: (109-152)/(56-68) 109/58 mmHg (07/26 0507) SpO2:  [95 %-97 %] 97 % (07/26 0507) Last BM Date: 10/26/14  Intake/Output from previous day: 07/25 0701 - 07/26 0700 In: 1207 [P.O.:1154; I.V.:3; IV Piggyback:50] Out: -  Intake/Output this shift:    PE: Abd: distended and somewhat tight, hypoactive BS, tender to palpation especially on the left side.  Lab Results:   Recent Labs  10/27/14 0428  WBC 7.1  HGB 9.5*  HCT 29.8*  PLT 383   BMET  Recent Labs  10/27/14 0428  NA 133*  K 3.7  CL 98*  CO2 24  GLUCOSE 127*  BUN 20  CREATININE 1.04  CALCIUM 9.1   PT/INR No results for input(s): LABPROT, INR in the last 72 hours. CMP     Component Value Date/Time   NA 133* 10/27/2014 0428   K 3.7 10/27/2014 0428   CL 98* 10/27/2014 0428   CO2 24 10/27/2014 0428   GLUCOSE 127* 10/27/2014 0428   BUN 20 10/27/2014 0428   CREATININE 1.04 10/27/2014 0428   CALCIUM 9.1 10/27/2014 0428   PROT 7.9 10/24/2014 0450   ALBUMIN 3.1* 10/24/2014 0450   AST 21 10/24/2014 0450   ALT 13* 10/24/2014 0450   ALKPHOS 140* 10/24/2014 0450   BILITOT 0.5 10/24/2014 0450   GFRNONAA >60 10/27/2014 0428   GFRAA >60 10/27/2014 0428   Lipase     Component Value Date/Time   LIPASE 30 10/22/2014 1707       Studies/Results: No results found.  Anti-infectives: Anti-infectives    Start     Dose/Rate Route Frequency Ordered Stop   10/23/14 2200  cefTRIAXone (ROCEPHIN) 1 g in dextrose 5 % 50 mL IVPB     1 g 100 mL/hr over 30 Minutes Intravenous Every 24 hours 10/22/14 2121     10/22/14 2030   cefTRIAXone (ROCEPHIN) 1 g in dextrose 5 % 50 mL IVPB     1 g 100 mL/hr over 30 Minutes Intravenous  Once 10/22/14 2017 10/22/14 2145       Assessment/Plan  High grade obstructing colonic lesion with liver lesions and peritoneal nodules all clinically consistent with metastatic colon cancer -psych evaluated the patient last week and cleared him with capacity. -cardiology saw him yesterday and cleared him at moderate risk -patient is agreeable to an operation today as his other option would be palliative care.  I have explained to him that this operation would be to relieve his obstruction only and not to cure him.  I also explained that he may end up with a colostomy bag.  He said he understand and agreed to proceed.  Will discuss timing with Dr. Barry Dienes. -back to clear liquids, and possibly NPO depending on what Dr. Barry Dienes says   LOS: 5 days    Brenisha Tsui E 10/27/2014, 8:18 AM Pager: [phone]

## 2014-10-28 ENCOUNTER — Encounter (HOSPITAL_COMMUNITY): Payer: Self-pay | Admitting: Internal Medicine

## 2014-10-28 DIAGNOSIS — I70209 Unspecified atherosclerosis of native arteries of extremities, unspecified extremity: Secondary | ICD-10-CM | POA: Diagnosis present

## 2014-10-28 DIAGNOSIS — D509 Iron deficiency anemia, unspecified: Secondary | ICD-10-CM | POA: Diagnosis present

## 2014-10-28 LAB — GLUCOSE, CAPILLARY: GLUCOSE-CAPILLARY: 83 mg/dL (ref 65–99)

## 2014-10-28 LAB — BASIC METABOLIC PANEL
ANION GAP: 8 (ref 5–15)
BUN: 20 mg/dL (ref 6–20)
CO2: 22 mmol/L (ref 22–32)
Calcium: 8 mg/dL — ABNORMAL LOW (ref 8.9–10.3)
Chloride: 106 mmol/L (ref 101–111)
Creatinine, Ser: 0.92 mg/dL (ref 0.61–1.24)
GFR calc non Af Amer: >60 mL/min (ref >60)
GLUCOSE: 89 mg/dL (ref 65–99)
Potassium: 4.8 mmol/L (ref 3.5–5.1)
SODIUM: 136 mmol/L (ref 135–145)

## 2014-10-28 LAB — CBC
HEMATOCRIT: 24.7 % — AB (ref 39.0–52.0)
Hemoglobin: 7.7 g/dL — ABNORMAL LOW (ref 13.0–17.0)
MCH: 24.2 pg — ABNORMAL LOW (ref 26.0–34.0)
MCHC: 31.2 g/dL (ref 30.0–36.0)
MCV: 77.7 fL — AB (ref 78.0–100.0)
PLATELETS: 280 10*3/uL (ref 150–400)
RBC: 3.18 MIL/uL — ABNORMAL LOW (ref 4.22–5.81)
RDW: 17.2 % — AB (ref 11.5–15.5)
WBC: 10.6 10*3/uL — ABNORMAL HIGH (ref 4.0–10.5)

## 2014-10-28 NOTE — Clinical Social Work Placement (Signed)
   CLINICAL SOCIAL WORK PLACEMENT  NOTE  Date:  10/28/2014  Patient Details  Name: Rodney [name] MRN: [id number] Date of Birth: 1945/05/12  Clinical Social Work is seeking post-discharge placement for this patient at the [place] level of care (*CSW will initial, date and re-position this form in  chart as items are completed):  Yes   Patient/family provided with [place] Work Department's list of facilities offering this level of care within the geographic area requested by the patient (or if unable, by the patient's family).  Yes   Patient/family informed of their freedom to choose among providers that offer the needed level of care, that participate in Medicare, Medicaid or managed care program needed by the patient, have an available bed and are willing to accept the patient.  Yes   Patient/family informed of [place]'s ownership interest in [facility] and [facility], as well as of the fact that they are under no obligation to receive care at these facilities.  PASRR submitted to EDS on 10/28/14     PASRR number received on 10/28/14     Existing PASRR number confirmed on       FL2 transmitted to all facilities in geographic area requested by pt/family on 10/28/14     FL2 transmitted to all facilities within larger geographic area on       Patient informed that his/her managed care company has contracts with or will negotiate with certain facilities, including the following:            Patient/family informed of bed offers received.  Patient chooses bed at       Physician recommends and patient chooses bed at      Patient to be transferred to   on  .  Patient to be transferred to facility by       Patient family notified on   of transfer.  Name of family member notified:        PHYSICIAN Please sign FL2     Additional Comment:    _______________________________________________ Ladell Pier, LCSW 10/28/2014, 10:51  AM

## 2014-10-28 NOTE — Care Management Note (Signed)
Case Management Note  Patient Details  Name: Ameya Kutz MRN: [id number] Date of Birth: 11-17-1945  Subjective/Objective:    69 yo admitted with Colonic mass                Action/Plan: From [facility], but going to SNF at DC  Expected Discharge Date:   (unknown)               Expected Discharge Plan:  [place]  In-House Referral:  Clinical Social Work  Discharge planning Services  CM Consult  Post Acute Care Choice:    Choice offered to:     DME Arranged:    DME Agency:     HH Arranged:    [place] Agency:     Status of Service:  In process, will continue to follow  Medicare Important Message Given:  Yes-third notification given Date Medicare IM Given:    Medicare IM give by:    Date Additional Medicare IM Given:    Additional Medicare Important Message give by:     If discussed at [place] of Stay Meetings, dates discussed:    Additional Comments:  Lynnell Catalan, RN 10/28/2014, 11:30 AM

## 2014-10-28 NOTE — Progress Notes (Signed)
CSW continuing to follow.   CSW followed up with pt at bedside to discuss disposition planning.   Pt admitted from [place], but will need higher level of care secondary to new colostomy placement.   CSW discussed with pt regarding need for higher level of care. Pt stated that he was agreeable to plan for SNF. Pt reports that he did not want to return to [facility], so pt is open to exploring SNF. CSW discussed with pt that CSW noted that pt has a [place] address. CSW explored with pt if he wanted placement in [place] or [place]. Pt states that he wishes to stay within [facility]. CSW noted from chart review that pt has been to [facility] in the past. CSW discussed with pt about past stay at [facility] and pt stated the his stay at [facility] was good and he may be interested in returning there.   CSW completed FL2 and initiated SNF search to [facility].   CSW to follow up with pt at bedside regarding SNF bed offers. CSW offered to contact pt family and pt stated that there was no need to contact family at this time.  Pt has been evaluated by psych MD this admission and has been deemed to have capacity to make decisions.  CSW to continue to follow and provide support and assist with pt disposition needs.   Alison Murray, MSW, [place] [facility] [phone]

## 2014-10-28 NOTE — Progress Notes (Signed)
Progress Note   Rodney [name] RXV:[id number] DOB: 07-12-1945 DOA: 10/22/2014 PCP: [name], NP   Brief Narrative:   Rodney [name] is an 69 y.o. male with a PMH of hypertension, alcohol abuse, PVD and CHF who was admitted 10/22/14 with chief complaint of abdominal pain. Workup in the ED included a CT of the abdomen and pelvis which showed a distal transverse colonic mass with high-grade stenosis concerning for malignancy. He initially refused surgical intervention and was felt to have capacity after a psychiatric evaluation. He ultimately consented to surgery and underwent a laparoscopy with right hemicolectomy, end ileostomy, mucous fistula and cholecystectomy on 10/27/14 for palliation.  Assessment/Plan:   Principal Problem:   Colonic mass / probable widely metastatic colon cancer - Status post palliative laparoscopy with right hemicolectomy, end ileostomy, mucous fistula and cholecystectomy on 10/27/14. - Pathology pending.  Active Problems:   HTN (hypertension) - Continue Coreg.    CAD (coronary artery disease), multivessel - Continue statin. Continue Coreg. - Resume aspirin when okay with surgery.    Chronic systolic CHF (congestive heart failure), NYHA class 4 - Status post 2-D echo on 10/25/14: EF 50%. Hypokinesis of inferolateral wall and basal inferior segment noted. LVH. - LV function markedly improved over prior echocardiogram. Improvement attributed to abstaining from alcohol.    Chronic microcytic anemia - Likely from chronic GI blood loss. Hemoglobin stable with no current indication for transfusion.    Hypokalemia - Repleted.    Malnutrition of moderate degree - Evaluated by dietitian 10/23/14. Continue supplements.    Escherichia coli urinary tract infection - On Rocephin. We'll discontinue as he has completed an appropriate course of therapy.    Peripheral vascular disease - Status post bilateral AKAs.    DVT Prophylaxis - Lovenox when okay with  surgery.  Family Communication: No family at the bedside. Declines my offer to call. Disposition Plan: SNF when stable and cleared by surgery postoperatively. Will need to be eating with recovery of bowel function at discharge, so likely will need a few more days in the hospital. Code Status:     Code Status Orders        Start     Ordered   10/22/14 2150  Full code   Continuous     10/22/14 2149        IV Access:    Peripheral IV   Procedures and diagnostic studies:   Ct Abdomen Pelvis W Contrast  10/22/2014   CLINICAL DATA:  69 year old male with right lower quadrant and periumbilical pain. Emesis  EXAM: CT ABDOMEN AND PELVIS WITH CONTRAST  TECHNIQUE: Multidetector CT imaging of the abdomen and pelvis was performed using the standard protocol following bolus administration of intravenous contrast.  CONTRAST:  86mL OMNIPAQUE IOHEXOL 300 MG/ML SOLN, 140mL OMNIPAQUE IOHEXOL 300 MG/ML SOLN  COMPARISON:  CT dated 08/21/2013  FINDINGS: Partially visualized small right pleural profusion. Top-normal cardiac size. Right cardiophrenic angle top-normal lymph nodes. No intra-abdominal free air. Small ascites.  There are innumerable hepatic hypodense lesions, increased from prior study. Some lesion such as the 3.8 x 4.5 cm hypodense lesion in the right lobe the liver adjacent the right hepatic vein likely represents a hemangioma and others were previously characterized as cysts. Other lesions such as a 2.4 x 3.4 cm subcapsular lesion seen in the right hepatic lobe posteriorly (series 2, image 19) are not well characterized but suspicious for metastatic disease. There are nodular implants predominantly involving the surface of the right lobe of the  liver with scalloping of the liver parenchyma compatible with subcapsular implants.  The gallbladder is not visualized, likely surgically absent. The pancreas is unremarkable. Surgical clips noted adjacent the head of the pancreas. Multiple hypodense  lesions noted predominantly involving the surface of the spleen concerning for metastatic implant. Left adrenal thickening/hyperplasia. There are bilateral renal cortical irregularity is. There is apparent diffuse thickening of the bladder wall which may be partly related to underdistention. Cystitis is not excluded. Correlation with urinalysis recommended. The prostate and seminal vesicles are grossly unremarkable.  There is an ill-defined irregular nodular mass measuring approximately 3.5 x 2.3 cm involving the distal transverse colon most compatible with malignancy. There is high-grade stenosis of the bowel with dilatation of the colon proximal to this mass. There is no evidence of small bowel obstruction. Scattered sigmoid diverticula without active inflammation.  There is aortoiliac atherosclerotic disease. A 2.6 cm infrarenal abdominal aortic ectasia. 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  There are nodular implants in the left lower abdomen with a combined dimension 1.9 x 4.7 cm. There is a 3.7 x 2.0 cm soft tissue implant is noted in the right lower quadrant adjacent the appendix. Multiple nodular implants are also noted adjacent to the tip of the appendix. Two 2 adjacent implants and free fluid extending from the subhepatic area. However, No definite evidence of acute appendicitis noted.  Multiple nodular densities anterior and inferior to the spleen compatible with metastatic implants.  Retroperitoneal/right periaortic adenopathy measures 1.3 cm in short axis.  Right anterior upper thigh lipoma noted with there is degenerative changes of the spine. No acute fracture.  IMPRESSION: Distal transverse colon obstructing mass most compatible with  malignancy. Further evaluation with colonoscopy and tissue sampling recommended. There is high-grade stenosis and dilatation of the colon proximal to this mass.  Peritoneal, mesenteric, and hepatic metastatic disease.  No definite CT evidence of acute appendicitis.   Electronically Signed   By: Anner Crete M.D.   On: 10/22/2014 19:59     Medical Consultants:    Psychiatry  General surgery  GI  Cardiology  Anti-Infectives:    Ceftriaxone 10/22/2014> 10/28/14  Subjective:   [name] denies any pain at the moment.  PCA was turned off because the patient kept removing the CO2 monitor.  Denies nausea or vomiting.    Objective:    Filed Vitals:   10/27/14 2108 10/28/14 0122 10/28/14 0306 10/28/14 0522  BP:    86/59  Pulse:    102  Temp:    98.8 F (37.1 C)  TempSrc:    Oral  Resp: 19 17 15 16   Height:      Weight:      SpO2: 98%  98% 95%    Intake/Output Summary (Last 24 hours) at 10/28/14 0801 Last data filed at 10/28/14 0522  Gross per 24 hour  Intake   1800 ml  Output    580 ml  Net   1220 ml    Exam: Gen:  NAD Cardiovascular:  RRR, II/VI SEM Respiratory:  Lungs CTAB Gastrointestinal:  Abdomen soft, ostomy with blood tinged effluent, sore abdomen Extremities:  Bilateral amputee   Data Reviewed:    Labs: Basic Metabolic Panel:  Recent Labs Lab 10/22/14 1707 10/23/14 0345 10/24/14  0450 10/27/14 0428  NA 136 131* 130* 133*  K 3.4* 3.9 3.5 3.7  CL 106 103 101 98*  CO2 20* 20* 20* 24  GLUCOSE 96 83 94 127*  BUN 21* 18 16 20   CREATININE 0.73 0.76 0.75 1.04  CALCIUM 9.3 8.9 8.8* 9.1   GFR Estimated Creatinine Clearance: 14.8 mL/min (by C-G formula based on Cr of 1.04). Liver Function Tests:  Recent Labs Lab 10/22/14 1707 10/23/14 0345 10/24/14 0450  AST 20 20 21   ALT 16* 14* 13*  ALKPHOS 157* 142* 140*  BILITOT 0.5 0.4 0.5  PROT 8.6* 7.9 7.9  ALBUMIN 3.4* 3.2* 3.1*    Recent Labs Lab 10/22/14 1707  LIPASE 30   Coagulation  profile  Recent Labs Lab 10/23/14 0345  INR 1.36    CBC:  Recent Labs Lab 10/22/14 1707 10/22/14 2338 10/23/14 0345 10/24/14 0450 10/27/14 0428  WBC 10.2  --  8.8 8.5 7.1  HGB 9.7*  --  9.4* 9.1* 9.5*  HCT 29.9* 28.7* 29.9* 28.3* 29.8*  MCV 77.5*  --  78.9 77.3* 78.2  PLT 416*  --  392 395 383   CBG:  Recent Labs Lab 10/23/14 0751 10/24/14 0738 10/25/14 0722 10/26/14 0731 10/27/14 0733  GLUCAP 98 105* 105* 110* 128*   Microbiology Recent Results (from the past 240 hour(s))  Urine culture     Status: None   Collection Time: 10/22/14  8:21 PM  Result Value Ref Range Status   Specimen Description URINE, CLEAN CATCH  Final   Special Requests Normal  Final   Culture   Final    >=100,000 COLONIES/mL ESCHERICHIA COLI Performed at [facility]    Report Status 10/25/2014 FINAL  Final   Organism ID, Bacteria ESCHERICHIA COLI  Final      Susceptibility   Escherichia coli - MIC*    AMPICILLIN <=2 SENSITIVE Sensitive     CEFAZOLIN <=4 SENSITIVE Sensitive     CEFTRIAXONE <=1 SENSITIVE Sensitive     CIPROFLOXACIN <=0.25 SENSITIVE Sensitive     GENTAMICIN <=1 SENSITIVE Sensitive     IMIPENEM <=0.25 SENSITIVE Sensitive     NITROFURANTOIN <=16 SENSITIVE Sensitive     TRIMETH/SULFA <=20 SENSITIVE Sensitive     AMPICILLIN/SULBACTAM <=2 SENSITIVE Sensitive     PIP/TAZO <=4 SENSITIVE Sensitive     * >=100,000 COLONIES/mL ESCHERICHIA COLI  Surgical pcr screen     Status: None   Collection Time: 10/22/14 11:13 PM  Result Value Ref Range Status   MRSA, PCR NEGATIVE NEGATIVE Final   Staphylococcus aureus NEGATIVE NEGATIVE Final    Comment:        The Xpert SA Assay (FDA approved for NASAL specimens in patients over 56 years of age), is one component of a comprehensive surveillance program.  Test performance has been validated by [facility] for patients greater than or equal to 30 year old. It is not intended to diagnose infection nor to guide or monitor  treatment.      Medications:   . antiseptic oral rinse  7 mL Mouth Rinse q12n4p  . atorvastatin  40 mg Oral QPM  . carvedilol  6.25 mg Oral BID WC  . cefTRIAXone (ROCEPHIN)  IV  1 g Intravenous Q24H  . chlorhexidine  15 mL Mouth Rinse BID  . dicyclomine  20 mg Oral QHS  . escitalopram  5 mg Oral Daily  . folic acid  1 mg Oral Daily  . mirtazapine  30 mg Oral QHS  .  morphine   Intravenous 6 times per day  . multivitamin with minerals  1 tablet Oral Daily  . sodium chloride  3 mL Intravenous Q12H  . thiamine  100 mg Oral Daily  . vitamin C  500 mg Oral Daily  . zinc sulfate  220 mg Oral Daily   Continuous Infusions: . sodium chloride 0.9 % 1,000 mL with potassium chloride 20 mEq infusion 100 mL/hr at 10/28/14 0306    Time spent: 25 minutes.   LOS: 6 days   [place] [facility] Pager [phone]. If unable to reach me by pager, please call my cell phone at [phone].  *Please refer to [email or web address], password [email or web address] to get updated schedule on who will round on this patient, as hospitalists switch teams weekly. If 7PM-7AM, please contact night-coverage at [email or web address], password [email or web address] for any overnight needs.  10/28/2014, 8:01 AM

## 2014-10-28 NOTE — Progress Notes (Signed)
1 Day Post-Op  Subjective: Awake sipping on ice chips here in bed, no real complaints.  Ostomy has some clear bloody drainage, Ostomy looks fine.  Objective: Vital signs in last 24 hours: Temp:  [98.6 F (37 C)-99.9 F (37.7 C)] 98.8 F (37.1 C) (07/27 0522) Pulse Rate:  [79-102] 102 (07/27 0522) Resp:  [15-22] 16 (07/27 0810) BP: (86-111)/(46-65) 86/59 mmHg (07/27 0522) SpO2:  [95 %-100 %] 98 % (07/27 0810) Last BM Date: 10/26/14 (Blood in colostomy) NPO Afebrile, BP down Some H/H down to 7.7/24.7  This AM Intake/Output from previous day: 07/26 0701 - 07/27 0700 In: 1800 [I.V.:1800] Out: 50 [Urine:380; Blood:200] Intake/Output this shift: Total I/O In: 60 [P.O.:60] Out: -   General appearance: alert, cooperative and no distress GI: sore, no BS, ostomy looks good, drainage from the ostomy is clear bloody colored fluid.    Lab Results:   Recent Labs  10/27/14 0428 10/28/14 0938  WBC 7.1 10.6*  HGB 9.5* 7.7*  HCT 29.8* 24.7*  PLT 383 280    BMET  Recent Labs  10/27/14 0428  NA 133*  K 3.7  CL 98*  CO2 24  GLUCOSE 127*  BUN 20  CREATININE 1.04  CALCIUM 9.1   PT/INR No results for input(s): LABPROT, INR in the last 72 hours.   Recent Labs Lab 10/22/14 1707 10/23/14 0345 10/24/14 0450  AST 20 20 21   ALT 16* 14* 13*  ALKPHOS 157* 142* 140*  BILITOT 0.5 0.4 0.5  PROT 8.6* 7.9 7.9  ALBUMIN 3.4* 3.2* 3.1*     Lipase     Component Value Date/Time   LIPASE 30 10/22/2014 1707     Studies/Results: No results found.  Medications: . antiseptic oral rinse  7 mL Mouth Rinse q12n4p  . atorvastatin  40 mg Oral QPM  . carvedilol  6.25 mg Oral BID WC  . chlorhexidine  15 mL Mouth Rinse BID  . dicyclomine  20 mg Oral QHS  . escitalopram  5 mg Oral Daily  . folic acid  1 mg Oral Daily  . mirtazapine  30 mg Oral QHS  . morphine   Intravenous 6 times per day  . multivitamin with minerals  1 tablet Oral Daily  . sodium chloride  3 mL Intravenous  Q12H  . thiamine  100 mg Oral Daily  . vitamin C  500 mg Oral Daily  . zinc sulfate  220 mg Oral Daily   . sodium chloride 0.9 % 1,000 mL with potassium chloride 20 mEq infusion 100 mL/hr at 10/28/14 0306   Assessment/Plan Obstructing colon cancer with peritoneal carcinomatosis Diagnostic laparoscopy, right hemicolectomy, end ileostomy, mucous fistula, cholecystectomy, 10/27/14, Dr. Barry Dienes PVOD/bilateral AKA Hypertension Hx of tobacco ongoing use ETOH use Hx of CHF Antibiotics: 6 days of ceftriaxone, 2 doses of flagyl pre op.  All currently discontinued DVT:  None anemia/no heparin, bilateral LE Amputations/no SCD   Plan:  Watch H/H and BP see how he does, I would get serial HH and transfuse if he needs it.  Await return of bowel functionbowel function.  He has been here 6 days, add prealbumin to labs AM and decide if we need to add some TNA till he gets bowel function back.  BMP pending today.    LOS: 6 days    Rodney [name] 10/28/2014

## 2014-10-28 NOTE — Progress Notes (Signed)
Patient educated on staying compliant with wearing both his oxygen saturation monitor and his CO2 monitor while PCA was going. Patient verbalized understanding but continued to pull off the CO2 monitor. Patient's PCA turned off and MD notified. Will continue to monitor.

## 2014-10-29 DIAGNOSIS — D62 Acute posthemorrhagic anemia: Secondary | ICD-10-CM | POA: Diagnosis present

## 2014-10-29 DIAGNOSIS — I509 Heart failure, unspecified: Secondary | ICD-10-CM

## 2014-10-29 LAB — GLUCOSE, CAPILLARY
GLUCOSE-CAPILLARY: 149 mg/dL — AB (ref 65–99)
Glucose-Capillary: 151 mg/dL — ABNORMAL HIGH (ref 65–99)
Glucose-Capillary: 61 mg/dL — ABNORMAL LOW (ref 65–99)
Glucose-Capillary: 121 mg/dL — ABNORMAL HIGH (ref 65–99)

## 2014-10-29 MED ORDER — VITAMIN C 500 MG PO TABS: 500.0000 mg | ORAL_TABLET | Freq: Every day | ORAL | Status: DC

## 2014-10-29 MED ORDER — SODIUM CHLORIDE 0.9 % IJ SOLN
10.0000 mL | Freq: Two times a day (BID) | INTRAMUSCULAR | Status: DC
  Administered 2014-10-29 – 2014-11-09 (×5): 10 mL
  Administered 2014-11-10: 20 mL

## 2014-10-29 MED ORDER — INSULIN ASPART 100 UNIT/ML ~~LOC~~ SOLN
0.0000 [IU] | Freq: Four times a day (QID) | SUBCUTANEOUS | Status: DC
  Administered 2014-10-29 – 2014-11-02 (×7): 1 [IU] via SUBCUTANEOUS

## 2014-10-29 MED ORDER — DEXTROSE 50 % IV SOLN
INTRAVENOUS | Status: AC
  Administered 2014-10-29: 50 mL
  Filled 2014-10-29: qty 50

## 2014-10-29 MED ORDER — KCL IN DEXTROSE-NACL 20-5-0.9 MEQ/L-%-% IV SOLN
INTRAVENOUS | Status: AC
  Administered 2014-10-29 – 2014-10-31 (×3): via INTRAVENOUS
  Filled 2014-10-29 (×4): qty 1000

## 2014-10-29 MED ORDER — FAT EMULSION 20 % IV EMUL
120.0000 mL | INTRAVENOUS | Status: AC
  Administered 2014-10-29: 120 mL via INTRAVENOUS
  Filled 2014-10-29: qty 200

## 2014-10-29 MED ORDER — SODIUM CHLORIDE 0.9 % IJ SOLN
10.0000 mL | INTRAMUSCULAR | Status: DC | PRN
  Administered 2014-11-06: 10 mL
  Filled 2014-10-29: qty 40

## 2014-10-29 MED ORDER — KCL IN DEXTROSE-NACL 20-5-0.9 MEQ/L-%-% IV SOLN
INTRAVENOUS | Status: AC
  Administered 2014-10-29: 12:00:00 via INTRAVENOUS
  Filled 2014-10-29 (×2): qty 1000

## 2014-10-29 MED ORDER — TRACE MINERALS CR-CU-MN-SE-ZN 10-1000-500-60 MCG/ML IV SOLN
INTRAVENOUS | Status: AC
  Administered 2014-10-29: 18:00:00 via INTRAVENOUS
  Filled 2014-10-29: qty 720

## 2014-10-29 NOTE — Progress Notes (Signed)
Nutrition Follow-up  DOCUMENTATION CODES:   Non-severe (moderate) malnutrition in context of chronic illness  INTERVENTION:   - Diet advancement vs. Nutrition support per MD  -Resume Ensure Enlive po BID, each supplement provides 350 kcal and 20 grams of protein if diet advanced. - RD will continue to monitor for needs  NUTRITION DIAGNOSIS:   Inadequate oral intake related to inability to eat as evidenced by NPO status.  Ongoing.  GOAL:   Patient will meet greater than or equal to 90% of their needs  Not meeting.  MONITOR:   Diet advancement, Weight trends, Labs, I & O's    ASSESSMENT:   69 y.o. male with multiple medical problems presents with abdominal pain. Patient states that the pain started about 3 days ago and is mainly located in the right side lower quadrant. He states that he has had a poor appetite also. He states he has been losing weight. He has had some diarrhea. He admits to nausea and also some vomting. He states he has no blood in his stools. Patient has prior history of vascular disease and has had bilateral amputations. He states that he does smoke. He states that he has a history of ETOH use in the past no drinks in the past 2-3 years. In the ED he had a CT of the abdomen done and this shows presence of a colonic mass likely malignant.  Pt is currently NPO. Pt continues to have poor appetite and is not eating much. Pt had a hypoglycemic event this morning. Per surgery, may consider TPN if continues to not eat. Estimated needs below.  Labs reviewed: CBGs: 61-151  Diet Order:  Diet NPO time specified  Skin:  Reviewed, no issues  Last BM:  7/27-colostomy  Height:   Ht Readings from Last 1 Encounters:  10/29/14 5\' 11"  (1.803 m)    Weight:   Wt Readings from Last 1 Encounters:  10/23/14 112 lb 1.6 oz (50.848 kg)    Ideal Body Weight:  78.1 kg  BMI:  Body mass index is 15.64 kg/(m^2).  Estimated Nutritional Needs:   Kcal:   1100-1300  Protein:  55-65 grams  Fluid:  1.7 L/day  EDUCATION NEEDS:   No education needs identified at this time  Clayton Bibles, MS, RD, LDN Pager: [phone] After Hours Pager: [phone]

## 2014-10-29 NOTE — Progress Notes (Signed)
Peripherally Inserted Central Catheter/Midline Placement  The IV Nurse has discussed with the patient and/or persons authorized to consent for the patient, the purpose of this procedure and the potential benefits and risks involved with this procedure.  The benefits include less needle sticks, lab draws from the catheter and patient may be discharged home with the catheter.  Risks include, but not limited to, infection, bleeding, blood clot (thrombus formation), and puncture of an artery; nerve damage and irregular heat beat.  Alternatives to this procedure were also discussed.  PICC/Midline Placement Documentation        Rodney [name] 10/29/2014, 2:41 PM

## 2014-10-29 NOTE — Care Management Important Message (Signed)
Important Message  Patient Details  Name: Rodney [name] MRN: [id number] Date of Birth: [birth year]   Medicare Important Message Given:  [facility] notification given    Camillo Flaming 10/29/2014, 1:08 [place] Message  Patient Details  Name: Rodney [name] MRN: [id number] Date of Birth: 04-02-1946   Medicare Important Message Given:  Yes-second notification given    Camillo Flaming 10/29/2014, 1:08 PM

## 2014-10-29 NOTE — Progress Notes (Signed)
Adult (Non-Pregnant) Hypoglycemia Protocol Treatment Guidelines  1.  RN shall initiate Hypoglycemia Protocol emergency measures immediately when:            w Routine or STAT CBG and/or a lab glucose indicates hypoglycemia (CBG < 70 mg/dl)  2.  Treat the patient according to ability to take PO's and severity of hypoglycemia.   3.  If patient is on GlucoStabilizer, follow directions provided by the GlucoStabilizer for hypoglycemic events.  4.  If patient on insulin pump, follow Hypoglycemia Protocol.  If patient requires more than one treatment have patient place pump in SUSPEND and notify MD.  DO NOT leave pump in SUSPEND for greater than 30 minutes unless ordered by MD.  A.  Treatment for Mild or Moderate-Patient cooperative and able to swallow    1.  Patient taking PO's and can cooperate   a.  Give one of the following 15 gram CHO options:                           w 1 tube oral dextrose gel                           w 3-4 Glucose tablets                           w 4 oz. Juice                           w 4 oz. regular soda                                    ESRD patients:  clear, regular soda                           w 8 oz. skim milk    b.  Recheck CBG in 15 minutes after treatment                            w If CBG < 70 mg/dl, repeat treatment and recheck until hypoglycemia is resolved                            w If CBG > 70 mg/dl and next meal is more than 1 hour away, give additional 15 grams CHO   2.  Patient NPO-Patient cooperative and no altered mental status    a.  Give 25 ml of D50 IV.   b.  Recheck CBG in 15 minutes after treatment.                             w If CBG is less than 70 mg/dl, repeat treatment and recheck until hypoglycemia is resolved.   c.  Notify MD for further orders.             SPECIAL CONSIDERATIONS:    a.  If no IV access,                              w Start IV of D5W at KVO                               w Give 25 ml of D50 IV.    b.  If  unable to gain IV access                             w Give Glucagon IM:    i.  1 mg if patient weighs more than 45.5 kg    ii.  0.5 mg if patient weighs less than 45.5 kg   c.  Notify MD for further orders  B.  Treatment for Severe-- Patient unconscious or unable to take PO's safely    1.  Position patient on side   2.  Give 50 ml D50 IV   3.  Recheck CBG in 15 minutes.                    w If CBG is less than 70 mg/dl, repeat treatment and recheck until hypoglycemia is resolved.   4.  Notify MD for further orders.    SPECIAL CONSIDERATIONS:    a.  If no IV access                              w Give Glucagon IM                                        i.  1 mg if patient weighs more than 45.5 kg                                       ii.  0.5 mg if patient weighs less than 45.5 kg                              w Start IV of D5W at 50 ml/hr and give 50 ml D50 IV   b.  If no IV access and active seizure                               w Call Rapid Response   c.  If unable to gain IV access, give Glucagon IM:                              w 1 mg if patient weighs more than 45.5 kg                              w 0.5 mg if patient weighs less than 45.5 kg   d.  Notify MD for further orders.  C.  Complete smart text progress note to document intervention and follow-up CBG   1.  In [facility] patient chart, click on Notes (left side of screen)   2.  Create Progress Note   3.  Click on [employer].  In the Match box type "hypo" and enter    4.  Double click on CHL IP HYPOGLYCEMIC EVENT and enter data   5.  MD must be notified if patient is NPO or experienced severe hypoglycemia   Hypoglycemic Event  CBG: 61  Treatment: D50 IV 25 mL  Symptoms: None  Follow-up CBG: Time: 0805 CBG Result: 151  Possible Reasons for Event: Inadequate meal intake  Comments/MD notified:Dr Rama notified at 0805    Buel Ream  Remember to initiate Hypoglycemia Order Set &  complete

## 2014-10-29 NOTE — Progress Notes (Addendum)
Progress Note   Rodney [name] INO:[id number] DOB: Dec 09, 1945 DOA: 10/22/2014 PCP: [name], NP   Brief Narrative:   Rodney [name] is an 69 y.o. male with a PMH of hypertension, alcohol abuse, PVD and CHF who was admitted 10/22/14 with chief complaint of abdominal pain. Workup in the ED included a CT of the abdomen and pelvis which showed a distal transverse colonic mass with high-grade stenosis concerning for malignancy. He initially refused surgical intervention and was felt to have capacity after a psychiatric evaluation. He ultimately consented to surgery and underwent a laparoscopy with right hemicolectomy, end ileostomy, mucous fistula and cholecystectomy on 10/27/14 for palliation.  Assessment/Plan:   Principal Problem:   Colonic mass / probable widely metastatic colon cancer - Status post palliative laparoscopy with right hemicolectomy, end ileostomy, mucous fistula and cholecystectomy on 10/27/14. - Pathology pending. - Ostomy nurse saw patient today and changed his pouch. - Diet advanced to clear liquids by surgery.  Active Problems:   HTN (hypertension) - Continue Coreg. Blood pressure is controlled.    CAD (coronary artery disease), multivessel - Continue statin. Continue Coreg. - Resume aspirin when okay with surgery.    Chronic systolic CHF (congestive heart failure), NYHA class 4 - Status post 2-D echo on 10/25/14: EF 50%. Hypokinesis of inferolateral wall and basal inferior segment noted. LVH. - LV function markedly improved over prior echocardiogram. Improvement attributed to abstaining from alcohol.    Chronic microcytic anemia / acute blood loss anemia - Likely from chronic GI blood loss. Hemoglobin stable with no current indication for transfusion. - 2 g drop in hemoglobin noted postoperatively. Monitor and transfuse if needed.    Hypokalemia - Repleted.    Malnutrition of moderate degree - Evaluated by dietitian 10/23/14. Continue supplements.   Escherichia coli urinary tract infection - Completed a course of therapy with Rocephin 7 days.    Peripheral vascular disease - Status post bilateral AKAs.    DVT Prophylaxis - Lovenox when okay with surgery.  Family Communication: No family at the bedside. Declines my offer to call. Disposition Plan: SNF when stable and cleared by surgery postoperatively. Will need to be eating with recovery of bowel function at discharge, so likely will need a few more days in the hospital. Code Status:     Code Status Orders        Start     Ordered   10/22/14 2150  Full code   Continuous     10/22/14 2149        IV Access:    Peripheral IV   Procedures and diagnostic studies:   Ct Abdomen Pelvis W Contrast  10/22/2014   CLINICAL DATA:  69 year old male with right lower quadrant and periumbilical pain. Emesis  EXAM: CT ABDOMEN AND PELVIS WITH CONTRAST  TECHNIQUE: Multidetector CT imaging of the abdomen and pelvis was performed using the standard protocol following bolus administration of intravenous contrast.  CONTRAST:  14mL OMNIPAQUE IOHEXOL 300 MG/ML SOLN, 123mL OMNIPAQUE IOHEXOL 300 MG/ML SOLN  COMPARISON:  CT dated 08/21/2013  FINDINGS: Partially visualized small right pleural profusion. Top-normal cardiac size. Right cardiophrenic angle top-normal lymph nodes. No intra-abdominal free air. Small ascites.  There are innumerable hepatic hypodense lesions, increased from prior study. Some lesion such as the 3.8 x 4.5 cm hypodense lesion in the right lobe the liver adjacent the right hepatic vein likely represents a hemangioma and others were previously characterized as cysts. Other lesions such as a 2.4 x 3.4 cm subcapsular lesion  seen in the right hepatic lobe posteriorly (series 2, image 19) are not well characterized but suspicious for metastatic disease. There are nodular implants predominantly involving the surface of the right lobe of the liver with scalloping of the liver parenchyma  compatible with subcapsular implants.  The gallbladder is not visualized, likely surgically absent. The pancreas is unremarkable. Surgical clips noted adjacent the head of the pancreas. Multiple hypodense lesions noted predominantly involving the surface of the spleen concerning for metastatic implant. Left adrenal thickening/hyperplasia. There are bilateral renal cortical irregularity is. There is apparent diffuse thickening of the bladder wall which may be partly related to underdistention. Cystitis is not excluded. Correlation with urinalysis recommended. The prostate and seminal vesicles are grossly unremarkable.  There is an ill-defined irregular nodular mass measuring approximately 3.5 x 2.3 cm involving the distal transverse colon most compatible with malignancy. There is high-grade stenosis of the bowel with dilatation of the colon proximal to this mass. There is no evidence of small bowel obstruction. Scattered sigmoid diverticula without active inflammation.  There is aortoiliac atherosclerotic disease. A 2.6 cm infrarenal abdominal aortic ectasia. 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  There are nodular implants in the left lower abdomen with a combined dimension 1.9 x 4.7 cm. There is a 3.7 x 2.0 cm soft tissue implant is noted in the right lower quadrant adjacent the appendix. Multiple nodular implants are also noted adjacent to the tip of the appendix. Two 2 adjacent implants and free fluid extending from the subhepatic area. However, No definite evidence of acute appendicitis noted.  Multiple nodular densities anterior and inferior to the spleen compatible with metastatic implants.  Retroperitoneal/right periaortic adenopathy measures 1.3 cm  in short axis.  Right anterior upper thigh lipoma noted with there is degenerative changes of the spine. No acute fracture.  IMPRESSION: Distal transverse colon obstructing mass most compatible with malignancy. Further evaluation with colonoscopy and tissue sampling recommended. There is high-grade stenosis and dilatation of the colon proximal to this mass.  Peritoneal, mesenteric, and hepatic metastatic disease.  No definite CT evidence of acute appendicitis.   Electronically Signed   By: Anner Crete M.D.   On: 10/22/2014 19:59     Medical Consultants:    Psychiatry  General surgery  GI  Cardiology  Anti-Infectives:    Ceftriaxone 10/22/2014> 10/28/14  Subjective:   Rodney [name] denies any pain at the moment.  PCA was turned off because the patient kept removing the CO2 monitor.  Denies nausea or vomiting.    Objective:    Filed Vitals:   10/29/14 0527 10/29/14 1110 10/29/14 1122 10/29/14 1200  BP: 112/46     Pulse: 78     Temp: 98.5 F (36.9 C)     TempSrc: Oral     Resp: 16     Height:  5\' 11"  (1.803 m) 2\' 11"  (0.889 m) 5\' 11"  (1.803 m)  Weight:      SpO2: 95%       Intake/Output Summary (Last 24 hours) at 10/29/14 1550 Last data filed at 10/29/14 1223  Gross per 24 hour  Intake    135 ml  Output    495 ml  Net   -360 ml    Exam: Gen:  NAD  Cardiovascular:  RRR, II/VI SEM Respiratory:  Lungs CTAB Gastrointestinal:  Abdomen soft, ostomy with blood tinged effluent, sore abdomen Extremities:  Bilateral amputee   Data Reviewed:    Labs: Basic Metabolic Panel:  Recent Labs Lab 10/22/14 1707 10/23/14 0345 10/24/14 0450 10/27/14 0428 10/28/14 0938  NA 136 131* 130* 133* 136  K 3.4* 3.9 3.5 3.7 4.8  CL 106 103 101 98* 106  CO2 20* 20* 20* 24 22  GLUCOSE 96 83 94 127* 89  BUN 21* 18 16 20 20   CREATININE 0.73 0.76 0.75 1.04 0.92  CALCIUM 9.3 8.9 8.8* 9.1 8.0*   GFR Estimated Creatinine Clearance: 53.7 mL/min (by C-G formula based on Cr of  0.92). Liver Function Tests:  Recent Labs Lab 10/22/14 1707 10/23/14 0345 10/24/14 0450  AST 20 20 21   ALT 16* 14* 13*  ALKPHOS 157* 142* 140*  BILITOT 0.5 0.4 0.5  PROT 8.6* 7.9 7.9  ALBUMIN 3.4* 3.2* 3.1*    Recent Labs Lab 10/22/14 1707  LIPASE 30   Coagulation profile  Recent Labs Lab 10/23/14 0345  INR 1.36    CBC:  Recent Labs Lab 10/22/14 1707 10/22/14 2338 10/23/14 0345 10/24/14 0450 10/27/14 0428 10/28/14 0938  WBC 10.2  --  8.8 8.5 7.1 10.6*  HGB 9.7*  --  9.4* 9.1* 9.5* 7.7*  HCT 29.9* 28.7* 29.9* 28.3* 29.8* 24.7*  MCV 77.5*  --  78.9 77.3* 78.2 77.7*  PLT 416*  --  392 395 383 280   CBG:  Recent Labs Lab 10/26/14 0731 10/27/14 0733 10/28/14 0805 10/29/14 0742 10/29/14 0805  GLUCAP 110* 128* 83 61* 151*   Microbiology Recent Results (from the past 240 hour(s))  Urine culture     Status: None   Collection Time: 10/22/14  8:21 PM  Result Value Ref Range Status   Specimen Description URINE, CLEAN CATCH  Final   Special Requests Normal  Final   Culture   Final    >=100,000 COLONIES/mL ESCHERICHIA COLI Performed at [facility]    Report Status 10/25/2014 FINAL  Final   Organism ID, Bacteria ESCHERICHIA COLI  Final      Susceptibility   Escherichia coli - MIC*    AMPICILLIN <=2 SENSITIVE Sensitive     CEFAZOLIN <=4 SENSITIVE Sensitive     CEFTRIAXONE <=1 SENSITIVE Sensitive     CIPROFLOXACIN <=0.25 SENSITIVE Sensitive     GENTAMICIN <=1 SENSITIVE Sensitive     IMIPENEM <=0.25 SENSITIVE Sensitive     NITROFURANTOIN <=16 SENSITIVE Sensitive     TRIMETH/SULFA <=20 SENSITIVE Sensitive     AMPICILLIN/SULBACTAM <=2 SENSITIVE Sensitive     PIP/TAZO <=4 SENSITIVE Sensitive     * >=100,000 COLONIES/mL ESCHERICHIA COLI  Surgical pcr screen     Status: None   Collection Time: 10/22/14 11:13 PM  Result Value Ref Range Status   MRSA, PCR NEGATIVE NEGATIVE Final   Staphylococcus aureus NEGATIVE NEGATIVE Final    Comment:          The Xpert SA Assay (FDA approved for NASAL specimens in patients over 65 years of age), is one component of a comprehensive surveillance program.  Test performance has been validated by [facility] for patients greater than or equal to 52 year old. It is not intended to diagnose infection nor to guide or monitor treatment.      Medications:   . antiseptic oral rinse  7 mL Mouth Rinse q12n4p  . atorvastatin  40 mg Oral QPM  .  carvedilol  6.25 mg Oral BID WC  . chlorhexidine  15 mL Mouth Rinse BID  . dicyclomine  20 mg Oral QHS  . escitalopram  5 mg Oral Daily  . folic acid  1 mg Oral Daily  . insulin aspart  0-9 Units Subcutaneous Q6H  . mirtazapine  30 mg Oral QHS  . sodium chloride  10-40 mL Intracatheter Q12H  . sodium chloride  3 mL Intravenous Q12H  . thiamine  100 mg Oral Daily   Continuous Infusions: . dextrose 5 % and 0.9 % NaCl with KCl 20 mEq/L 100 mL/hr at 10/29/14 1134  . dextrose 5 % and 0.9 % NaCl with KCl 20 mEq/L    . TPN (CLINIMIX) Adult without lytes     And  . fat emulsion      Time spent: 25 minutes.   LOS: 7 days   [facility] Pager [phone]. If unable to reach me by pager, please call my cell phone at [phone].  *Please refer to [email or web address], password [email or web address] to get updated schedule on who will round on this patient, as hospitalists switch teams weekly. If 7PM-7AM, please contact night-coverage at [email or web address], password [email or web address] for any overnight needs.  10/29/2014, 3:50 PM

## 2014-10-29 NOTE — Progress Notes (Signed)
CSW continuing to follow.   CSW followed up with pt at bedside to provide SNF bed offers.  CSW provided SNF bed offers to pt at bedside.  Pt discussed that he was hopeful that he would be able to move into an apartment. CSW discussed that pt will need skilled level of care for a short time given pt new colostomy in order to ensure pt learns how to care for the colostomy appropriately. Pt states, "okay".   Pt stated that he is not yet ready to make a decision about SNF facility. CSW will follow up with pt at a later time to get decision for SNF.  CSW to continue to follow to provide support and assist with pt disposition needs.   Alison Murray, MSW, [place] [facility] [phone]

## 2014-10-29 NOTE — Progress Notes (Signed)
2 Days Post-Op  Subjective: He looks comfortable, but says his stomach hurts.  Dressing is soaked thru, looks like old blood, but it's attached to ostomy bag so I will need to change both at the same time.  A little clear fluid in ostomy bag.   Objective: Vital signs in last 24 hours: Temp:  [98.4 F (36.9 C)-98.7 F (37.1 C)] 98.5 F (36.9 C) (07/28 0527) Pulse Rate:  [78-103] 78 (07/28 0527) Resp:  [16] 16 (07/28 0527) BP: (96-120)/(46-59) 112/46 mmHg (07/28 0527) SpO2:  [94 %-95 %] 95 % (07/28 0527) Last BM Date: 10/28/14 (gel like red in color) PO 255  -  875 urine recorded 45 from ostomy bag Afebrile, VSS No labs, patient refused collection No films  Intake/Output from previous day: 07/27 0701 - 07/28 0700 In: 2531 [P.O.:255; I.V.:2276] Out: 920 [Urine:875; Stool:45] Intake/Output this shift:    General appearance: alert, cooperative and no distress GI: soft, still very tender, no bowel sounds, I will take dressing down wtith ostomy bag change.  nothing but clear fluid in the ostomy bag, no gas.  Ostomy looks OK.  Lab Results:   Recent Labs  10/27/14 0428 10/28/14 0938  WBC 7.1 10.6*  HGB 9.5* 7.7*  HCT 29.8* 24.7*  PLT 383 280    BMET  Recent Labs  10/27/14 0428 10/28/14 0938  NA 133* 136  K 3.7 4.8  CL 98* 106  CO2 24 22  GLUCOSE 127* 89  BUN 20 20  CREATININE 1.04 0.92  CALCIUM 9.1 8.0*   PT/INR No results for input(s): LABPROT, INR in the last 72 hours.   Recent Labs Lab 10/22/14 1707 10/23/14 0345 10/24/14 0450  AST 20 20 21   ALT 16* 14* 13*  ALKPHOS 157* 142* 140*  BILITOT 0.5 0.4 0.5  PROT 8.6* 7.9 7.9  ALBUMIN 3.4* 3.2* 3.1*     Lipase     Component Value Date/Time   LIPASE 30 10/22/2014 1707     Studies/Results: No results found.  Medications: . antiseptic oral rinse  7 mL Mouth Rinse q12n4p  . atorvastatin  40 mg Oral QPM  . carvedilol  6.25 mg Oral BID WC  . chlorhexidine  15 mL Mouth Rinse BID  . dicyclomine  20  mg Oral QHS  . escitalopram  5 mg Oral Daily  . folic acid  1 mg Oral Daily  . mirtazapine  30 mg Oral QHS  . multivitamin with minerals  1 tablet Oral Daily  . sodium chloride  3 mL Intravenous Q12H  . thiamine  100 mg Oral Daily  . vitamin C  500 mg Oral Daily  . zinc sulfate  220 mg Oral Daily    Assessment/Plan Obstructing colon cancer with peritoneal carcinomatosis Diagnostic laparoscopy, right hemicolectomy, end ileostomy, mucous fistula, cholecystectomy, 10/27/14, Dr. Barry Dienes  POD 2 PVOD/bilateral AKA Hypertension Hx of tobacco ongoing use ETOH use Hx of CHF Anemia - refused blood draw this AM. Malnutrition  Hospital day 7 Antibiotics: 6 days of ceftriaxone, 2 doses of flagyl pre op. All currently discontinued DVT: None anemia/no heparin, bilateral LE Amputations/no SCD  PLAN:  Change dressing later, I will try to get labs tomorrow.  Plan TNA he hasn't eaten for 7 days. Await bowel function return.   LOS: 7 days    Taffany Heiser 10/29/2014

## 2014-10-29 NOTE — Evaluation (Signed)
Physical Therapy Evaluation Patient Details Name: Rodney [name] MRN: [id number] DOB: 1945-08-28 Today's Date: 10/29/2014   History of Present Illness  69 y.o. male with a PMH of hypertension, alcohol abuse, PVD, bil AKA, CHF who was admitted 10/22/14 with chief complaint of abdominal pain. Workup in the ED included a CT of the abdomen and pelvis which showed a distal transverse colonic mass with high-grade stenosis concerning for malignancy and pt underwent a laparoscopy with right hemicolectomy, end ileostomy, mucous fistula and cholecystectomy on 10/27/14   Clinical Impression  Pt admitted with above diagnosis. Pt currently with functional limitations due to the deficits listed below (see PT Problem List).  Pt will benefit from skilled PT to increase their independence and safety with mobility to allow discharge to the venue listed below.   Pt reports increased abdominal pain and nausea which limited session today.     Follow Up Recommendations SNF    Equipment Recommendations  None recommended by PT    Recommendations for Other Services       Precautions / Restrictions Precautions Precautions: Fall Precaution Comments: bil AKA, ileostomy      Mobility  Bed Mobility Overal bed mobility: Needs Assistance Bed Mobility: Supine to Sit;Sit to Supine     Supine to sit: Min assist Sit to supine: Min assist   General bed mobility comments: assist for trunk upright, assist for controlling descent  Transfers Overall transfer level: Needs assistance   Transfers: Lateral/Scoot Transfers          Lateral/Scoot Transfers: Mod assist;Min guard General transfer comment: pt min/guard from elevated surface, more assist for back to bed as bed wouldn't lower to w/c level, required safety cues for brakes  Ambulation/Gait                Hotel manager mobility: Yes Wheelchair propulsion: Both upper  extremities Wheelchair parts: Supervision/cueing Distance: 20 Wheelchair Assistance Details (indicate cue type and reason): pt only felt able to propel to/from bathroom due to pain and nausea (RN brought meds into room), good maneuvering of w/c however requires cues for brakes and armrests with stopping/preparing for transfer  Modified Rankin (Stroke Patients Only)       Balance                                             Pertinent Vitals/Pain Pain Assessment: Faces Faces Pain Scale: Hurts even more Pain Location: abdomen Pain Descriptors / Indicators: Constant;Sore Pain Intervention(s): Limited activity within patient's tolerance;Monitored during session;RN gave pain meds during session;Repositioned    Home Living Family/patient expects to be discharged to:: Skilled nursing facility                      Prior Function Level of Independence: Independent with assistive device(s)         Comments: pt poor historian, uses wheelchair     Hand Dominance        Extremity/Trunk Assessment   Upper Extremity Assessment: Generalized weakness (unable to perform pushup from w/c)           Lower Extremity Assessment: Overall WFL for tasks assessed (bil AKA)         Communication   Communication: No difficulties  Cognition Arousal/Alertness: Awake/alert Behavior During Therapy: WFL for tasks assessed/performed  Overall Cognitive Status: Impaired/Different from baseline Area of Impairment: Safety/judgement         Safety/Judgement: Decreased awareness of safety          General Comments      Exercises        Assessment/Plan    PT Assessment Patient needs continued PT services  PT Diagnosis Generalized weakness;Acute pain   PT Problem List Decreased activity tolerance;Decreased mobility;Decreased strength  PT Treatment Interventions DME instruction;Functional mobility training;Patient/family education;Wheelchair mobility  training;Therapeutic exercise;Therapeutic activities   PT Goals (Current goals can be found in the Care Plan section) Acute Rehab PT Goals PT Goal Formulation: With patient Time For Goal Achievement: 11/05/14 Potential to Achieve Goals: Good    Frequency Min 3X/week   Barriers to discharge        Co-evaluation               End of Session   Activity Tolerance: Patient limited by pain Patient left: in bed;with call bell/phone within reach;with bed alarm set Nurse Communication: Mobility status;Patient requests pain meds         Time: 0943-1010 PT Time Calculation (min) (ACUTE ONLY): 27 min   Charges:   PT Evaluation $Initial PT Evaluation Tier I: 1 Procedure     PT G Codes:        Shunsuke Granzow,KATHrine E 10/29/2014, 11:31 AM Carmelia Bake, PT, DPT 10/29/2014 Pager: [phone]

## 2014-10-29 NOTE — Clinical Social Work Placement (Signed)
   CLINICAL SOCIAL WORK PLACEMENT  NOTE  Date:  10/29/2014  Patient Details  Name: Rodney [name] MRN: [id number] Date of Birth: 1945-10-19  Clinical Social Work is seeking post-discharge placement for this patient at the [place] level of care (*CSW will initial, date and re-position this form in  chart as items are completed):  Yes   Patient/family provided with [facility] Work Department's list of facilities offering this level of care within the geographic area requested by the patient (or if unable, by the patient's family).  Yes   Patient/family informed of their freedom to choose among providers that offer the needed level of care, that participate in Medicare, Medicaid or managed care program needed by the patient, have an available bed and are willing to accept the patient.  Yes   Patient/family informed of [facility]'s ownership interest in [facility] and [facility], as well as of the fact that they are under no obligation to receive care at these facilities.  PASRR submitted to EDS on 10/28/14     PASRR number received on 10/28/14     Existing PASRR number confirmed on       FL2 transmitted to all facilities in geographic area requested by pt/family on 10/28/14     FL2 transmitted to all facilities within larger geographic area on       Patient informed that his/her managed care company has contracts with or will negotiate with certain facilities, including the following:        Yes   Patient/family informed of bed offers received.  Patient chooses bed at       Physician recommends and patient chooses bed at      Patient to be transferred to   on  .  Patient to be transferred to facility by       Patient family notified on   of transfer.  Name of family member notified:        PHYSICIAN Please sign FL2     Additional Comment:    _______________________________________________ Ladell Pier, LCSW 10/29/2014, 1:37  PM

## 2014-10-29 NOTE — Progress Notes (Addendum)
PARENTERAL NUTRITION CONSULT NOTE - INITIAL  Pharmacy Consult for TPN Indication: Prolonged ileus  No Known Allergies  Patient Measurements: Height: 2\' 11"  (88.9 cm) Weight: 112 lb 1.6 oz (50.848 kg) IBW/kg (Calculated) : -7.5 78.1 kg Adjusted Body Weight:  Usual Weight:   Vital Signs: Temp: 98.5 F (36.9 C) (07/28 0527) Temp Source: Oral (07/28 0527) BP: 112/46 mmHg (07/28 0527) Pulse Rate: 78 (07/28 0527) Intake/Output from previous day: 07/27 0701 - 07/28 0700 In: 2531 [P.O.:255; I.V.:2276] Out: 920 [Urine:875; Stool:45] Intake/Output from this shift: Total I/O In: -  Out: 125 [Urine:125]  Labs:  Recent Labs  10/27/14 0428 10/28/14 0938  WBC 7.1 10.6*  HGB 9.5* 7.7*  HCT 29.8* 24.7*  PLT 383 280     Recent Labs  10/27/14 0428 10/28/14 0938  NA 133* 136  K 3.7 4.8  CL 98* 106  CO2 24 22  GLUCOSE 127* 89  BUN 20 20  CREATININE 1.04 0.92  CALCIUM 9.1 8.0*   Estimated Creatinine Clearance: 16.7 mL/min (by C-G formula based on Cr of 0.92).    Recent Labs  10/28/14 0805 10/29/14 0742 10/29/14 0805  GLUCAP 83 61* 151*    Medical History: Past Medical History  Diagnosis Date  . HTN (hypertension)   . Smoking hx 03/25/2013  . Alcohol abuse 03/25/2013  . CHF (congestive heart failure) 03/25/2013  . Peripheral vascular disease   . Septic shock(785.52) 03/26/2013  . Acute systolic CHF (congestive heart failure), NYHA class 4 03/28/2013    EF 15% global hypokinesis   . Osteomyelitis of left foot 01/22/2014  . Cellulitis of foot 01/22/2014  . Cardiomyopathy, ischemic 02/09/2014  . 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 antiseptic oral rinse  7 mL Mouth Rinse q12n4p  . atorvastatin  40 mg Oral QPM  . carvedilol  6.25 mg Oral BID WC  . chlorhexidine  15 mL Mouth Rinse BID  . dicyclomine  20 mg Oral QHS  . escitalopram  5 mg Oral Daily  . folic acid  1 mg Oral Daily  .  mirtazapine  30 mg Oral QHS  . multivitamin with minerals  1 tablet Oral Daily  . sodium chloride  3 mL Intravenous Q12H  . thiamine  100 mg Oral Daily  . vitamin C  500 mg Oral Daily  . zinc sulfate  220 mg Oral Daily   Infusions:  . dextrose 5 % and 0.9 % NaCl with KCl 20 mEq/L 100 mL/hr at 10/29/14 1134   PRN: acetaminophen **OR** acetaminophen, alum & mag hydroxide-simeth, calcium carbonate, furosemide, morphine, ondansetron **OR** ondansetron (ZOFRAN) IV, oxyCODONE, sodium chloride  Insulin Requirements: None currently ordered  Current Nutrition: NPO except ice chips  IVF: D5NS with 20 KCl at 100 ml/hr  Central access: PICC to be placed 7/28  TPN start date: plan 7/28  ASSESSMENT  HPI: 69 yo male admitted 10/22/14 with abdominal pain. CT of the abdomen and pelvis showed colonic mass concerning for malignancy. On 10/27/14, he underwent a palliative right hemicolectomy, end ileostomy, mucous fistula and cholecystectomy. Pharmacy is consulted 7/28 to begin TPN.  Significant events:   Today: 10/29/2014   Glucose: No hx DM; hypoglycemic this AM (CBG = 61), presumably d/t NPO status - given 30ml D50, repeat CBG 151  Electrolytes: refused lab draw this AM; 7/27 - all WNL except corrected calclium of 8.72 slightly low. No Mg/Phos checked this admit - will check in AM.  Renal: SCr WNL, CrCl ~75 ml/min/1.23m2 (normalized), UOP ok  LFTs: WNL (7/23)  TGs: 143 (7/21), check in AM  Prealbumin: check in AM  NUTRITIONAL GOALS                                                                                             RD recs: 1100-1300 kcal, 55-65 g protein per day Clinimix 5/15 at a goal rate of 68ml/hr + 20% fat emulsion at 48ml/hr to provide: 60g/day protein, 1332Kcal/day.  PLAN                                                                                                                          At 1800 today:  Start Clinimix NO LYTES 5/15 at 1ml/hr.  20% fat emulsion at 61ml/hr.  Plan to advance as tolerated to the goal rate - advance rate slowly as patient at risk for refeeding syndrome given NPO ~7 days.  TPN to contain standard multivitamins and trace elements - hold PO multivitamin, vitamin C and zinc while receiving in TPN.  Reduce IVF to 59ml/hr.  Add SSI q6h sensitive scale  TPN lab panels on Mondays & Thursdays.  F/u daily.  Peggyann Juba, PharmD, BCPS Pager: [phone] 10/29/2014,12:18 PM

## 2014-10-29 NOTE — Consult Note (Signed)
WOC ostomy consult note Stoma type/location: RUQ End Ileostomy with mucus fistula Stomal assessment/size: 1 1/2" stoma, slightly budded, flush from 3 to 9 o'clock.  Will add barrier ring.  Peristomal assessment: Intact.  Midline abdominal incision with staples 3 cm from stoma.  Treatment options for stomal/peristomal skin: Barrier ring and 1 piece flat pouch.  Output Small amount pink liquid in bag.  Ostomy pouching: 1pc. With barrier ring Education provided: Patient is asleep, has just had PICC line placed.  No education today. Pouch change and abdominal dressing changed.  Enrolled patient in [place] program: No WOC team will continue to follow.  Domenic Moras RN BSN [place] Pager [phone]

## 2014-10-30 DIAGNOSIS — C19 Malignant neoplasm of rectosigmoid junction: Secondary | ICD-10-CM | POA: Diagnosis present

## 2014-10-30 LAB — COMPREHENSIVE METABOLIC PANEL
ALT: 34 U/L (ref 17–63)
AST: 64 U/L — AB (ref 15–41)
Albumin: 2 g/dL — ABNORMAL LOW (ref 3.5–5.0)
Alkaline Phosphatase: 104 U/L (ref 38–126)
Anion gap: 3 — ABNORMAL LOW (ref 5–15)
BUN: 12 mg/dL (ref 6–20)
CALCIUM: 7.8 mg/dL — AB (ref 8.9–10.3)
CHLORIDE: 109 mmol/L (ref 101–111)
CO2: 23 mmol/L (ref 22–32)
CREATININE: 0.62 mg/dL (ref 0.61–1.24)
GFR calc Af Amer: >60 mL/min (ref >60)
GLUCOSE: 151 mg/dL — AB (ref 65–99)
POTASSIUM: 4.1 mmol/L (ref 3.5–5.1)
SODIUM: 135 mmol/L (ref 135–145)
TOTAL PROTEIN: 5.9 g/dL — AB (ref 6.5–8.1)
Total Bilirubin: 0.4 mg/dL (ref 0.3–1.2)

## 2014-10-30 LAB — CBC
HCT: 21 % — ABNORMAL LOW (ref 39.0–52.0)
Hemoglobin: 6.9 g/dL — CL (ref 13.0–17.0)
MCH: 25.6 pg — ABNORMAL LOW (ref 26.0–34.0)
MCHC: 32.9 g/dL (ref 30.0–36.0)
MCV: 77.8 fL — AB (ref 78.0–100.0)
PLATELETS: 275 10*3/uL (ref 150–400)
RBC: 2.7 MIL/uL — ABNORMAL LOW (ref 4.22–5.81)
RDW: 17.6 % — ABNORMAL HIGH (ref 11.5–15.5)
WBC: 12.4 10*3/uL — AB (ref 4.0–10.5)

## 2014-10-30 LAB — GLUCOSE, CAPILLARY
Glucose-Capillary: 126 mg/dL — ABNORMAL HIGH (ref 65–99)
Glucose-Capillary: 111 mg/dL — ABNORMAL HIGH (ref 65–99)
Glucose-Capillary: 125 mg/dL — ABNORMAL HIGH (ref 65–99)

## 2014-10-30 LAB — PREPARE RBC (CROSSMATCH)

## 2014-10-30 LAB — DIFFERENTIAL
Basophils Absolute: 0 10*3/uL (ref 0.0–0.1)
Basophils Relative: 0 % (ref 0–1)
Eosinophils Absolute: 0.2 10*3/uL (ref 0.0–0.7)
Eosinophils Relative: 1 % (ref 0–5)
LYMPHS PCT: 11 % — AB (ref 12–46)
Lymphs Abs: 1.4 10*3/uL (ref 0.7–4.0)
MONO ABS: 1 10*3/uL (ref 0.1–1.0)
MONOS PCT: 8 % (ref 3–12)
NEUTROS ABS: 9.8 10*3/uL — AB (ref 1.7–7.7)
Neutrophils Relative %: 79 % — ABNORMAL HIGH (ref 43–77)

## 2014-10-30 LAB — MAGNESIUM: Magnesium: 2 mg/dL (ref 1.7–2.4)

## 2014-10-30 LAB — TRIGLYCERIDES: TRIGLYCERIDES: 158 mg/dL — AB (ref <150)

## 2014-10-30 LAB — PREALBUMIN: Prealbumin: 4.1 mg/dL — ABNORMAL LOW (ref 18–38)

## 2014-10-30 LAB — PHOSPHORUS: Phosphorus: 1.3 mg/dL — ABNORMAL LOW (ref 2.5–4.6)

## 2014-10-30 MED ORDER — SODIUM PHOSPHATE 3 MMOLE/ML IV SOLN
30.0000 mmol | Freq: Once | INTRAVENOUS | Status: AC
  Administered 2014-10-30: 30 mmol via INTRAVENOUS
  Filled 2014-10-30: qty 10

## 2014-10-30 MED ORDER — SODIUM CHLORIDE 0.9 % IV SOLN
Freq: Once | INTRAVENOUS | Status: AC
  Administered 2014-10-30: 10:00:00 via INTRAVENOUS

## 2014-10-30 MED ORDER — TRACE MINERALS CR-CU-MN-SE-ZN 10-1000-500-60 MCG/ML IV SOLN
INTRAVENOUS | Status: AC
  Administered 2014-10-30: 18:00:00 via INTRAVENOUS
  Filled 2014-10-30 (×2): qty 720

## 2014-10-30 MED ORDER — FAT EMULSION 20 % IV EMUL
120.0000 mL | INTRAVENOUS | Status: AC
  Administered 2014-10-30: 120 mL via INTRAVENOUS
  Filled 2014-10-30: qty 200
  Filled 2014-10-30: qty 250

## 2014-10-30 NOTE — Progress Notes (Signed)
Progress Note   Rodney [name] GMW:[id number] DOB: [birth year] DOA: 10/22/2014 PCP: [name], NP   Brief Narrative:   Rodney [name] is an 69 y.o. male with a PMH of hypertension, alcohol abuse, PVD and CHF who was admitted 10/22/14 with chief complaint of abdominal pain. Workup in the ED included a CT of the abdomen and pelvis which showed a distal transverse colonic mass with high-grade stenosis concerning for malignancy. He initially refused surgical intervention and was felt to have capacity after a psychiatric evaluation. He ultimately consented to surgery and underwent a laparoscopy with right hemicolectomy, end ileostomy, mucous fistula and cholecystectomy on 10/27/14 for palliation.  Assessment/Plan:   Principal Problem:   Colonic mass / probable widely metastatic colon cancer - Status post palliative laparoscopy with right hemicolectomy, end ileostomy, mucous fistula and cholecystectomy on 10/27/14. - Pathology consistent with colorectal adenocarcinoma. - Diet advanced to clear liquids by surgery 10/29/14.  Active Problems:   HTN (hypertension) - Continue Coreg. Blood pressure is controlled.    CAD (coronary artery disease), multivessel - Continue statin. Continue Coreg. - Resume aspirin when okay with surgery.    Chronic systolic CHF (congestive heart failure), NYHA class 4 - Status post 2-D echo on 10/25/14: EF 50%. Hypokinesis of inferolateral wall and basal inferior segment noted. LVH. - LV function markedly improved over prior echocardiogram. Improvement attributed to abstaining from alcohol.    Chronic microcytic anemia / acute blood loss anemia - Likely from acute postoperative blood loss in the setting of chronic GI blood loss.  - Hemoglobin 6.9 this morning, transfuse 1 unit.    Hypokalemia - Repleted.    Malnutrition of moderate degree/hypophosphatemia - Evaluated by dietitian 10/23/14. Continue supplements. - TPN started by surgery. - Monitor for refeeding  syndrome. Phosphorus will be supplemented by pharmacy.    Escherichia coli urinary tract infection - Completed a course of therapy with Rocephin 7 days.    Peripheral vascular disease - Status post bilateral AKAs.    DVT Prophylaxis - Lovenox when okay with surgery.  Family Communication: No family at the bedside. Declines my offer to call. Disposition Plan: SNF when stable and cleared by surgery postoperatively. Will need to be eating with recovery of bowel function and off TNA at discharge, so likely will need a few more days in the hospital. Code Status:     Code Status Orders        Start     Ordered   10/22/14 2150  Full code   Continuous     10/22/14 2149        IV Access:    Peripheral IV   Procedures and diagnostic studies:   Ct Abdomen Pelvis W Contrast  10/22/2014   CLINICAL DATA:  69 year old male with right lower quadrant and periumbilical pain. Emesis  EXAM: CT ABDOMEN AND PELVIS WITH CONTRAST  TECHNIQUE: Multidetector CT imaging of the abdomen and pelvis was performed using the standard protocol following bolus administration of intravenous contrast.  CONTRAST:  55mL OMNIPAQUE IOHEXOL 300 MG/ML SOLN, 129mL OMNIPAQUE IOHEXOL 300 MG/ML SOLN  COMPARISON:  CT dated 08/21/2013  FINDINGS: Partially visualized small right pleural profusion. Top-normal cardiac size. Right cardiophrenic angle top-normal lymph nodes. No intra-abdominal free air. Small ascites.  There are innumerable hepatic hypodense lesions, increased from prior study. Some lesion such as the 3.8 x 4.5 cm hypodense lesion in the right lobe the liver adjacent the right hepatic vein likely represents a hemangioma and others were previously characterized as cysts.  Other lesions such as a 2.4 x 3.4 cm subcapsular lesion seen in the right hepatic lobe posteriorly (series 2, image 19) are not well characterized but suspicious for metastatic disease. There are nodular implants predominantly involving the surface  of the right lobe of the liver with scalloping of the liver parenchyma compatible with subcapsular implants.  The gallbladder is not visualized, likely surgically absent. The pancreas is unremarkable. Surgical clips noted adjacent the head of the pancreas. Multiple hypodense lesions noted predominantly involving the surface of the spleen concerning for metastatic implant. Left adrenal thickening/hyperplasia. There are bilateral renal cortical irregularity is. There is apparent diffuse thickening of the bladder wall which may be partly related to underdistention. Cystitis is not excluded. Correlation with urinalysis recommended. The prostate and seminal vesicles are grossly unremarkable.  There is an ill-defined irregular nodular mass measuring approximately 3.5 x 2.3 cm involving the distal transverse colon most compatible with malignancy. There is high-grade stenosis of the bowel with dilatation of the colon proximal to this mass. There is no evidence of small bowel obstruction. Scattered sigmoid diverticula without active inflammation.  There is aortoiliac atherosclerotic disease. A 2.6 cm infrarenal abdominal aortic ectasia. 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  There are nodular implants in the left lower abdomen with a combined dimension 1.9 x 4.7 cm. There is a 3.7 x 2.0 cm soft tissue implant is noted in the right lower quadrant adjacent the appendix. Multiple nodular implants are also noted adjacent to the tip of the appendix. Two 2 adjacent implants and free fluid extending from the subhepatic area. However, No definite evidence of acute appendicitis noted.  Multiple nodular densities anterior and inferior to the spleen compatible with metastatic  implants.  Retroperitoneal/right periaortic adenopathy measures 1.3 cm in short axis.  Right anterior upper thigh lipoma noted with there is degenerative changes of the spine. No acute fracture.  IMPRESSION: Distal transverse colon obstructing mass most compatible with malignancy. Further evaluation with colonoscopy and tissue sampling recommended. There is high-grade stenosis and dilatation of the colon proximal to this mass.  Peritoneal, mesenteric, and hepatic metastatic disease.  No definite CT evidence of acute appendicitis.   Electronically Signed   By: Anner Crete M.D.   On: 10/22/2014 19:59     Medical Consultants:    Psychiatry  General surgery  GI  Cardiology  Anti-Infectives:    Ceftriaxone 10/22/2014> 10/28/14  Subjective:   [name] feels a bit bloated with some abdominal discomfort, no nausea or vomiting.  No dyspnea or cough.  Objective:    Filed Vitals:   10/29/14 1122 10/29/14 1200 10/29/14 2036 10/30/14 0535  BP:   106/60 117/58  Pulse:   80 84  Temp:   98.7 F (37.1 C) 98.6 F (37 C)  TempSrc:   Oral Oral  Resp:   16 16  Height: 2\' 11"  (0.889 m) 5\' 11"  (1.803 m)    Weight:      SpO2:   97% 97%    Intake/Output Summary (Last 24 hours) at 10/30/14 0813 Last data filed at 10/30/14 0550  Gross per 24 hour  Intake    120 ml  Output    800 ml  Net   -680 ml    Exam: Gen:  NAD Cardiovascular:  RRR, II/VI SEM Respiratory:  Lungs CTAB Gastrointestinal:  Abdomen soft, ostomy with blood tinged effluent, sore abdomen, no significant bowel sounds Extremities:  Bilateral amputee   Data Reviewed:    Labs: Basic Metabolic Panel:  Recent Labs Lab 10/24/14 0450 10/27/14 0428 10/28/14 0938 10/30/14 0545  NA 130* 133* 136 135  K 3.5 3.7 4.8 4.1  CL 101 98* 106 109  CO2 20* 24 22 23   GLUCOSE 94 127* 89 151*  BUN 16 20 20 12   CREATININE 0.75 1.04 0.92 0.62  CALCIUM 8.8* 9.1 8.0* 7.8*  MG  --   --   --  2.0  PHOS  --   --   --  1.3*    GFR Estimated Creatinine Clearance: 61.7 mL/min (by C-G formula based on Cr of 0.62). Liver Function Tests:  Recent Labs Lab 10/24/14 0450 10/30/14 0545  AST 21 64*  ALT 13* 34  ALKPHOS 140* 104  BILITOT 0.5 0.4  PROT 7.9 5.9*  ALBUMIN 3.1* 2.0*   CBC:  Recent Labs Lab 10/24/14 0450 10/27/14 0428 10/28/14 0938 10/30/14 0545  WBC 8.5 7.1 10.6* 12.4*  NEUTROABS  --   --   --  9.8*  HGB 9.1* 9.5* 7.7* 6.9*  HCT 28.3* 29.8* 24.7* 21.0*  MCV 77.3* 78.2 77.7* 77.8*  PLT 395 383 280 275   CBG:  Recent Labs Lab 10/29/14 0742 10/29/14 0805 10/29/14 1818 10/29/14 2354 10/30/14 0534  GLUCAP 61* 151* 121* 149* 126*   Microbiology Recent Results (from the past 240 hour(s))  Urine culture     Status: None   Collection Time: 10/22/14  8:21 PM  Result Value Ref Range Status   Specimen Description URINE, CLEAN CATCH  Final   Special Requests Normal  Final   Culture   Final    >=100,000 COLONIES/mL ESCHERICHIA COLI Performed at [facility]    Report Status 10/25/2014 FINAL  Final   Organism ID, Bacteria ESCHERICHIA COLI  Final      Susceptibility   Escherichia coli - MIC*    AMPICILLIN <=2 SENSITIVE Sensitive     CEFAZOLIN <=4 SENSITIVE Sensitive     CEFTRIAXONE <=1 SENSITIVE Sensitive     CIPROFLOXACIN <=0.25 SENSITIVE Sensitive     GENTAMICIN <=1 SENSITIVE Sensitive     IMIPENEM <=0.25 SENSITIVE Sensitive     NITROFURANTOIN <=16 SENSITIVE Sensitive     TRIMETH/SULFA <=20 SENSITIVE Sensitive     AMPICILLIN/SULBACTAM <=2 SENSITIVE Sensitive     PIP/TAZO <=4 SENSITIVE Sensitive     * >=100,000 COLONIES/mL ESCHERICHIA COLI  Surgical pcr screen     Status: None   Collection Time: 10/22/14 11:13 PM  Result Value Ref Range Status   MRSA, PCR NEGATIVE NEGATIVE Final   Staphylococcus aureus NEGATIVE NEGATIVE Final    Comment:        The Xpert SA Assay (FDA approved for NASAL specimens in patients over 62 years of age), is one component of a  comprehensive surveillance program.  Test performance has been validated by [facility] for patients greater than or equal to 5 year old. It is not intended to diagnose infection nor to guide or monitor treatment.      Medications:   . sodium chloride   Intravenous Once  . antiseptic oral rinse  7 mL Mouth Rinse q12n4p  . atorvastatin  40 mg Oral QPM  . carvedilol  6.25 mg Oral BID WC  . chlorhexidine  15 mL Mouth Rinse BID  .  dicyclomine  20 mg Oral QHS  . escitalopram  5 mg Oral Daily  . folic acid  1 mg Oral Daily  . insulin aspart  0-9 Units Subcutaneous Q6H  . mirtazapine  30 mg Oral QHS  . sodium chloride  10-40 mL Intracatheter Q12H  . sodium chloride  3 mL Intravenous Q12H  . thiamine  100 mg Oral Daily   Continuous Infusions: . dextrose 5 % and 0.9 % NaCl with KCl 20 mEq/L 65 mL/hr at 10/29/14 2134  . TPN (CLINIMIX) Adult without lytes 30 mL/hr at 10/29/14 1750   And  . fat emulsion 120 mL (10/29/14 1750)    Time spent: 25 minutes.   LOS: 8 days   [place] [facility] Pager [phone]. If unable to reach me by pager, please call my cell phone at [phone].  *Please refer to [email or web address], password [email or web address] to get updated schedule on who will round on this patient, as hospitalists switch teams weekly. If 7PM-7AM, please contact night-coverage at [email or web address], password [email or web address] for any overnight needs.  10/30/2014, 8:13 AM

## 2014-10-30 NOTE — Consult Note (Addendum)
WOC ostomy follow up CCS following for assessment and plan of care to abd wound. Stoma type/location: Pt had ileostomy surgery on 7/26. Current pouch intact to RLQ with good seal, pouch change and teaching session was performed yesterday.  Stomal assessment/size: Stoma red and viable when visualized through pouch Output: No stool or flatus in pouch at this time. Ostomy pouching: 1pc.  Education provided: No family members are present and pt states he is hurting and feels poorly. [place] team can follow on Monday for further teaching session. Educational materials left at bedside and extra supplies are in the room for staff nurse use.  Enrolled patient in [place] Start Discharge program: Yes Julien Girt MSN, RN, [place], [address], [place]

## 2014-10-30 NOTE — Progress Notes (Signed)
3 Days Post-Op  Subjective: No bowel sounds, feels bloated. Nothing in bag except some clear fluid, he is starting to get some gas in bag.  No bowel sounds.  Objective: Vital signs in last 24 hours: Temp:  [98.6 F (37 C)-98.7 F (37.1 C)] 98.6 F (37 C) (07/29 0535) Pulse Rate:  [80-84] 84 (07/29 0535) Resp:  [16] 16 (07/29 0535) BP: (106-117)/(58-60) 117/58 mmHg (07/29 0535) SpO2:  [97 %] 97 % (07/29 0535) Last BM Date:  (Colostomy - blood drainage) 120 PO Afebrile, VSS Labs show prealbumin 4.1 WBC 12.4 H/H 6.9/21 Intake/Output from previous day: 07/28 0701 - 07/29 0700 In: 120 [P.O.:120] Out: 800 [Urine:800] Intake/Output this shift: Total I/O In: -  Out: 200 [Urine:200]  General appearance: alert, cooperative and no distress Resp: clear to auscultation bilaterally and anterior GI: soft, sore, mildly distended.  No BS, ostomy looks fine, his wound is Ok, not much from the wicks.  Ostomy bag may have a little gas, but not much, small amount of fluid that is clear.  Lab Results:   Recent Labs  10/28/14 0938 10/30/14 0545  WBC 10.6* 12.4*  HGB 7.7* 6.9*  HCT 24.7* 21.0*  PLT 280 275    BMET  Recent Labs  10/28/14 0938 10/30/14 0545  NA 136 135  K 4.8 4.1  CL 106 109  CO2 22 23  GLUCOSE 89 151*  BUN 20 12  CREATININE 0.92 0.62  CALCIUM 8.0* 7.8*   PT/INR No results for input(s): LABPROT, INR in the last 72 hours.   Recent Labs Lab 10/24/14 0450 10/30/14 0545  AST 21 64*  ALT 13* 34  ALKPHOS 140* 104  BILITOT 0.5 0.4  PROT 7.9 5.9*  ALBUMIN 3.1* 2.0*     Lipase     Component Value Date/Time   LIPASE 30 10/22/2014 1707     Studies/Results: No results found.  Medications: . sodium chloride   Intravenous Once  . antiseptic oral rinse  7 mL Mouth Rinse q12n4p  . atorvastatin  40 mg Oral QPM  . carvedilol  6.25 mg Oral BID WC  . chlorhexidine  15 mL Mouth Rinse BID  . dicyclomine  20 mg Oral QHS  . escitalopram  5 mg Oral Daily  .  folic acid  1 mg Oral Daily  . insulin aspart  0-9 Units Subcutaneous Q6H  . mirtazapine  30 mg Oral QHS  . sodium chloride  10-40 mL Intracatheter Q12H  . sodium chloride  3 mL Intravenous Q12H  . sodium phosphate  Dextrose 5% IVPB  30 mmol Intravenous Once  . thiamine  100 mg Oral Daily   . dextrose 5 % and 0.9 % NaCl with KCl 20 mEq/L 65 mL/hr at 10/29/14 2134  . TPN (CLINIMIX) Adult without lytes 30 mL/hr at 10/29/14 1750   And  . fat emulsion 120 mL (10/29/14 1750)  . Marland KitchenTPN (CLINIMIX-E) Adult     And  . fat emulsion      Assessment/Plan Obstructing colon cancer with peritoneal carcinomatosis Diagnostic laparoscopy, right hemicolectomy, end ileostomy, mucous fistula, cholecystectomy, 10/27/14, Dr. Barry Dienes POD 3 Post op ileus PVOD/bilateral AKA Hypertension Hx of tobacco ongoing use ETOH use Hx of CHF Anemia - being transfused 1 unit of PRBC now. Malnutrition Hospital day 7  Pre albumin 4-  On TNA Antibiotics: 6 days of ceftriaxone, 2 doses of flagyl pre op. All currently discontinued DVT: None anemia/no heparin, bilateral LE Amputations/no SCD    Plan:  I have him  on sips and ice chips, I don't think with his bloating we can go up yet.  Wound looks good not much on the wicks.  He just needs time to recover bowel function.  Anemia and Malnutrition being addressed with transfusion/TNA.    LOS: 8 days    Margart Zemanek 10/30/2014

## 2014-10-30 NOTE — Progress Notes (Signed)
CSW continuing to follow.   CSW followed up with pt at bedside. CSW provided support. CSW inquired with pt if he had considered the SNF bed offers. Pt states that he has looked at the offers, but has not yet made a decision.   Per MD, pt not yet medically ready, but plan is for SNF when stable and cleared by surgery postoperatively. Will need to be eating with recovery of bowel function at discharge, so likely will need a few more days in the hospital.  CSW discussed with pt that pt can continue to consider options, but it will be important to have a decision by early next week. Pt expressed understanding.  CSW to continue to follow to provide support and assist with pt discharge planning needs.   Rodney [name], MSW, [place] [facility] [phone]

## 2014-10-30 NOTE — Progress Notes (Signed)
Critical lab hgb=6.9. On call provider notified. No new orders at this time.

## 2014-10-30 NOTE — Progress Notes (Signed)
PARENTERAL NUTRITION CONSULT NOTE - INITIAL  Pharmacy Consult for TPN Indication: Prolonged ileus  No Known Allergies  Patient Measurements:   Height: 5\' 11"  (180.3 cm) Weight: 112 lb 1.6 oz (50.848 kg) IBW/kg (Calculated) : 75.3  IBW calculated based on height of 5'11". Current total body weight much lower than IBW due to bilateral AKAs.  Vital Signs: Temp: 98.6 F (37 C) (07/29 0535) Temp Source: Oral (07/29 0535) BP: 117/58 mmHg (07/29 0535) Pulse Rate: 84 (07/29 0535) Intake/Output from previous day: 07/28 0701 - 07/29 0700 In: 120 [P.O.:120] Out: 800 [Urine:800] Intake/Output from this shift:    Labs:  Recent Labs  10/28/14 0938 10/30/14 0545  WBC 10.6* 12.4*  HGB 7.7* 6.9*  HCT 24.7* 21.0*  PLT 280 275     Recent Labs  10/28/14 0938 10/30/14 0545  NA 136 135  K 4.8 4.1  CL 106 109  CO2 22 23  GLUCOSE 89 151*  BUN 20 12  CREATININE 0.92 0.62  CALCIUM 8.0* 7.8*  MG  --  2.0  PHOS  --  1.3*  PROT  --  5.9*  ALBUMIN  --  2.0*  AST  --  64*  ALT  --  34  ALKPHOS  --  104  BILITOT  --  0.4   Estimated Creatinine Clearance: 61.7 mL/min (by C-G formula based on Cr of 0.62).    Recent Labs  10/29/14 1818 10/29/14 2354 10/30/14 0534  GLUCAP 121* 149* 126*    Medical History: Past Medical History  Diagnosis Date  . HTN (hypertension)   . Smoking hx 03/25/2013  . Alcohol abuse 03/25/2013  . CHF (congestive heart failure) 03/25/2013  . Peripheral vascular disease   . Septic shock(785.52) 03/26/2013  . Acute systolic CHF (congestive heart failure), NYHA class 4 03/28/2013    EF 15% global hypokinesis   . Osteomyelitis of left foot 01/22/2014  . Cellulitis of foot 01/22/2014  . Cardiomyopathy, ischemic 02/09/2014  . 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 sodium chloride   Intravenous Once  . antiseptic oral rinse  7 mL Mouth Rinse q12n4p  . atorvastatin  40 mg Oral  QPM  . carvedilol  6.25 mg Oral BID WC  . chlorhexidine  15 mL Mouth Rinse BID  . dicyclomine  20 mg Oral QHS  . escitalopram  5 mg Oral Daily  . folic acid  1 mg Oral Daily  . insulin aspart  0-9 Units Subcutaneous Q6H  . mirtazapine  30 mg Oral QHS  . sodium chloride  10-40 mL Intracatheter Q12H  . sodium chloride  3 mL Intravenous Q12H  . thiamine  100 mg Oral Daily   Infusions:  . dextrose 5 % and 0.9 % NaCl with KCl 20 mEq/L 65 mL/hr at 10/29/14 2134  . TPN (CLINIMIX) Adult without lytes 30 mL/hr at 10/29/14 1750   And  . fat emulsion 120 mL (10/29/14 1750)   PRN: acetaminophen **OR** acetaminophen, alum & mag hydroxide-simeth, calcium carbonate, furosemide, morphine, ondansetron **OR** ondansetron (ZOFRAN) IV, oxyCODONE, sodium chloride, sodium chloride  Insulin Requirements: 2 units SSI  Current Nutrition:  Clinimix 5/15 at 30 ml/hr CLD with comment "start with sips of clears" ordered 7/28  IVF: D5NS with 20 KCl at 100 ml/hr  Central access: PICC 7/28  TPN start date: plan 7/28  ASSESSMENT  HPI: 69 yo male admitted 10/22/14 with abdominal pain. CT of the abdomen and pelvis showed colonic mass concerning for malignancy. On 10/27/14, he underwent a palliative right hemicolectomy, end ileostomy, mucous fistula and cholecystectomy. Pharmacy is consulted 7/28 to begin TPN.  Significant events:   Today: 10/30/2014   Glucose: No hx DM; hypoglycemic yesterday morning prior to start of TPN, presumably d/t NPO status.  CBGs controlled today.  Electrolytes: refused lab draw 7/28 AM. Today's labs show LOW phosphorus at 1.3. Will replace this morning and switch Clinimix to formulation with electrolytes. K+/Mag at goal.  CorrCa 9.4.   Renal: SCr WNL  LFTs: ok, AST slightly elevated  TGs: 143 (7/21), 158 (7/29)  Prealbumin: 4.1 (7/29)  NUTRITIONAL GOALS                                                                                              RD recs (7/28): 1100-1300 kcal, 55-65 g protein per day, fluid 1.7 L/day Clinimix 5/15 at a goal rate of 44ml/hr + 20% fat emulsion at 54ml/hr to provide: 60g/day protein, 1332Kcal/day.  PLAN                                                                                                                         At 1800 today:  Switch Clinimix to E 5/15 formulation but continue at low rate of 82ml/hr due to low phosphorus.  20% fat emulsion at 53ml/hr.  Plan to advance as tolerated to the goal rate - advance rate slowly as patient at risk for refeeding syndrome given NPO ~7 days prior to start of TPN.  TPN to contain standard multivitamins and trace elements - hold PO multivitamin, vitamin C and zinc while receiving in TPN.  NaPhos 30 mmol IV in 250 mL D5W x 1 this AM  Continue IVF at 23ml/hr.  Continue CBG checks and sensitive scale SSI q6h  TPN lab panels on Mondays & Thursdays.  Check BMET, Mag, Phos in AM.  F/u daily.  F/u advancement of oral diet.  Hershal Coria, PharmD, BCPS Pager: [phone] 10/30/2014 8:45 AM

## 2014-10-31 LAB — BASIC METABOLIC PANEL
ANION GAP: 5 (ref 5–15)
BUN: 9 mg/dL (ref 6–20)
CHLORIDE: 109 mmol/L (ref 101–111)
CO2: 23 mmol/L (ref 22–32)
CREATININE: 0.54 mg/dL — AB (ref 0.61–1.24)
Calcium: 7.7 mg/dL — ABNORMAL LOW (ref 8.9–10.3)
GFR calc Af Amer: >60 mL/min (ref >60)
GFR calc non Af Amer: >60 mL/min (ref >60)
Glucose, Bld: 112 mg/dL — ABNORMAL HIGH (ref 65–99)
POTASSIUM: 4.1 mmol/L (ref 3.5–5.1)
Sodium: 137 mmol/L (ref 135–145)

## 2014-10-31 LAB — GLUCOSE, CAPILLARY
GLUCOSE-CAPILLARY: 116 mg/dL — AB (ref 65–99)
GLUCOSE-CAPILLARY: 136 mg/dL — AB (ref 65–99)
Glucose-Capillary: 139 mg/dL — ABNORMAL HIGH (ref 65–99)

## 2014-10-31 LAB — CBC
HCT: 28.3 % — ABNORMAL LOW (ref 39.0–52.0)
Hemoglobin: 9.2 g/dL — ABNORMAL LOW (ref 13.0–17.0)
MCH: 25.9 pg — AB (ref 26.0–34.0)
MCHC: 32.5 g/dL (ref 30.0–36.0)
MCV: 79.7 fL (ref 78.0–100.0)
PLATELETS: 243 10*3/uL (ref 150–400)
RBC: 3.55 MIL/uL — AB (ref 4.22–5.81)
RDW: 17.8 % — ABNORMAL HIGH (ref 11.5–15.5)
WBC: 14.3 10*3/uL — ABNORMAL HIGH (ref 4.0–10.5)

## 2014-10-31 LAB — MAGNESIUM: Magnesium: 1.9 mg/dL (ref 1.7–2.4)

## 2014-10-31 LAB — PHOSPHORUS: Phosphorus: 3 mg/dL (ref 2.5–4.6)

## 2014-10-31 MED ORDER — TRACE MINERALS CR-CU-MN-SE-ZN 10-1000-500-60 MCG/ML IV SOLN
INTRAVENOUS | Status: DC
  Administered 2014-10-31: 17:00:00 via INTRAVENOUS
  Filled 2014-10-31: qty 960

## 2014-10-31 MED ORDER — FAT EMULSION 20 % IV EMUL
240.0000 mL | INTRAVENOUS | Status: DC
  Administered 2014-10-31: 240 mL via INTRAVENOUS
  Filled 2014-10-31: qty 250

## 2014-10-31 MED ORDER — POTASSIUM CHLORIDE IN NACL 20-0.9 MEQ/L-% IV SOLN
INTRAVENOUS | Status: AC
  Administered 2014-10-31 – 2014-11-01 (×2): via INTRAVENOUS
  Filled 2014-10-31 (×2): qty 1000

## 2014-10-31 NOTE — Progress Notes (Addendum)
General Surgery Note  LOS: 9 days  POD -  4 Days Post-Op  Assessment/Plan: 1.  DIAGNOSTIC LAPAROSCOPY, RIGHT HEMICOLECTOMY WITH MUCOUS FISTULA - 10/27/2014 - [facility]  Wound is soupy.  I removed the wicks.  ON BID dressing changes.  On clear liquid diet.  I will leave him on that for now.   2. Metastatic disease.  Path report - [id number] - omentum positive for adenoca 3. 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 Kellie Simmering Right AKA - 03/26/2013 - T. Early  4. HTN 5. Smokes - 1/2 ppdsd 6. Chronic systolic heart failure 7.  Anemia - Hgb - 9.2 - 10/31/2014 8.  On TPN for malnutrition   Principal Problem:   Primary colorectal adenocarcinoma Active Problems:   HTN (hypertension)   CAD (coronary artery disease)   Colonic mass   Chronic systolic CHF (congestive heart failure), NYHA class 4   Hypokalemia   Malnutrition of moderate degree   Escherichia coli urinary tract infection   Microcytic anemia   Atherosclerotic peripheral vascular disease status post bilateral AKA's   Postoperative anemia due to acute blood loss   Hypophosphatemia  Subjective:  Alert.  No specific complaint. Objective:   Filed Vitals:   10/31/14 0618  BP: 128/56  Pulse: 87  Temp: 98.1 F (36.7 C)  Resp: 20     Intake/Output from previous day:  07/29 0701 - 07/30 0700 In: 390 [Blood:390] Out: 900 [Urine:900]  Intake/Output this shift:  Total I/O In: -  Out: 200 [Stool:200]   Physical Exam:   General: Older AA M   HEENT: Normal. Pupils equal. .   Lungs: Clear.   Abdomen: Soft.  Has BS.   Wound: Right sided ileostomy - has put out 200 cc today.  Mid line wound looks soupy - I removed the wicks.   Lab Results:    Recent Labs  10/30/14 0545 10/31/14 0528  WBC 12.4* 14.3*  HGB 6.9* 9.2*  HCT 21.0* 28.3*  PLT 275 243    BMET   Recent Labs  10/30/14 0545 10/31/14 0528  NA 135 137  K 4.1 4.1  CL 109 109  CO2 23 23  GLUCOSE 151* 112*  BUN 12 9   CREATININE 0.62 0.54*  CALCIUM 7.8* 7.7*    PT/INR  No results for input(s): LABPROT, INR in the last 72 hours.  ABG  No results for input(s): PHART, HCO3 in the last 72 hours.  Invalid input(s): PCO2, PO2   Studies/Results:  No results found.   Anti-infectives:   Anti-infectives    Start     Dose/Rate Route Frequency Ordered Stop   10/27/14 1630  metroNIDAZOLE (FLAGYL) IVPB 500 mg     500 mg 100 mL/hr over 60 Minutes Intravenous 3 times daily 10/27/14 1559 10/27/14 1743   10/27/14 0915  [MAR Hold]  metroNIDAZOLE (FLAGYL) IVPB 500 mg     (MAR Hold since 10/27/14 1122)   500 mg 100 mL/hr over 60 Minutes Intravenous 30 min pre-op 10/27/14 0837 10/27/14 1328   10/23/14 2200  cefTRIAXone (ROCEPHIN) 1 g in dextrose 5 % 50 mL IVPB  Status:  Discontinued     1 g 100 mL/hr over 30 Minutes Intravenous Every 24 hours 10/22/14 2121 10/28/14 0816   10/22/14 2030  cefTRIAXone (ROCEPHIN) 1 g in dextrose 5 % 50 mL IVPB     1 g 100 mL/hr over 30 Minutes Intravenous  Once 10/22/14 2017 10/22/14 2145      Alphonsa Overall, MD, FACS Pager:  [facility] Office: [phone] 10/31/2014

## 2014-10-31 NOTE — Progress Notes (Signed)
PARENTERAL NUTRITION CONSULT NOTE  Pharmacy Consult for TPN Indication: Prolonged ileus  No Known Allergies  Patient Measurements:   Height: 5\' 11"  (180.3 cm) Weight: 112 lb 1.6 oz (50.848 kg) IBW/kg (Calculated) : 75.3  IBW calculated based on height of 5'11". Current total body weight much lower than IBW due to bilateral AKAs.  Vital Signs: Temp: 98.1 F (36.7 C) (07/30 0618) Temp Source: Oral (07/30 0618) BP: 128/56 mmHg (07/30 0618) Pulse Rate: 87 (07/30 0618) Intake/Output from previous day: 07/29 0701 - 07/30 0700 In: 390 [Blood:390] Out: 900 [Urine:900] Intake/Output from this shift:    Labs:  Recent Labs  10/28/14 0938 10/30/14 0545 10/31/14 0528  WBC 10.6* 12.4* 14.3*  HGB 7.7* 6.9* 9.2*  HCT 24.7* 21.0* 28.3*  PLT 280 275 243     Recent Labs  10/28/14 0938 10/30/14 0545 10/31/14 0528  NA 136 135  --   K 4.8 4.1  --   CL 106 109  --   CO2 22 23  --   GLUCOSE 89 151*  --   BUN 20 12  --   CREATININE 0.92 0.62  --   CALCIUM 8.0* 7.8*  --   MG  --  2.0 1.9  PHOS  --  1.3* 3.0  PROT  --  5.9*  --   ALBUMIN  --  2.0*  --   AST  --  64*  --   ALT  --  34  --   ALKPHOS  --  104  --   BILITOT  --  0.4  --   PREALBUMIN  --  4.1*  --   TRIG  --  158*  --    Estimated Creatinine Clearance: 61.7 mL/min (by C-G formula based on Cr of 0.62).    Recent Labs  10/30/14 1216 10/30/14 1751 10/31/14 0616  GLUCAP 111* 125* 116*   Infusions:  . dextrose 5 % and 0.9 % NaCl with KCl 20 mEq/L 65 mL/hr at 10/31/14 0645  . Marland KitchenTPN (CLINIMIX-E) Adult 30 mL/hr at 10/30/14 1759   And  . fat emulsion 120 mL (10/30/14 1758)   Insulin Requirements: 2 units SSI yesterday  Current Nutrition:  Clinimix 5/15 at 30 ml/hr CLD with comment "start with sips of clears" ordered 7/28  IVF: D5NS with 20 KCl at 65 ml/hr  Central access: PICC 7/28  TPN start date: 7/28  ASSESSMENT                                                                                                           HPI: 69 yo male admitted 10/22/14 with abdominal pain. CT of the abdomen and pelvis showed colonic mass concerning for malignancy. On 10/27/14, he underwent a palliative right hemicolectomy, end ileostomy, mucous fistula and cholecystectomy. Pharmacy is consulted 7/28 to begin TPN.  Significant events:  7/30: minimal improvement in intake; no BS.  Surgery not advancing diet.  Today: 10/31/2014   Glucose: CBGs wnl  Electrolytes: Phos improved today; all lytes wnl (CorrCa 9.3)    Renal: SCr WNL;  UOP adequate; +5L since admit  LFTs: unremarkable; Albumin low  TGs: 143 (7/21), 158 (7/29)  Prealbumin: 4.1 (7/29)  NUTRITIONAL GOALS                                                                                             RD recs (7/28): 1100-1300 kcal, 55-65 g protein per day, fluid 1.7 L/day  Clinimix 5/15 at a goal rate of 12ml/hr + 20% fat emulsion at 71ml/hr to provide: 60g/day protein, 1332Kcal/day.  PLAN                                                                                                                         At 1800 today:  Advance Clinimix E 5/15 formulation to 40 ml/hr;  20% fat emulsion at 10 ml/hr  Plan to advance as tolerated to the goal rate.  Patient at risk for refeeding syndrome given NPO ~7 days prior to start of TPN, but so far tolerating well per lytes.  TPN to contain standard multivitamins and trace elements - hold PO multivitamin, vitamin C and zinc while receiving in TPN.  Reduce IVF to 55 ml/hr, remove dextrose from IVF  Continue CBG checks and sensitive scale SSI q6h  No supplemental lytes today  TPN lab panels on Mondays & Thursdays.  Check BMET, Phos in AM.  F/u daily.  F/u advancement of oral diet.  Reuel Boom, PharmD, BCPS Pager: [phone] 10/31/2014, 7:58 AM

## 2014-10-31 NOTE — Progress Notes (Signed)
Progress Note   Rodney [name] WFU:[id number] DOB: 08/07/1945 DOA: 10/22/2014 PCP: [name], NP   Brief Narrative:   Rodney [name] is an 69 y.o. male with a PMH of hypertension, alcohol abuse, PVD and CHF who was admitted 10/22/14 with chief complaint of abdominal pain. Workup in the ED included a CT of the abdomen and pelvis which showed a distal transverse colonic mass with high-grade stenosis concerning for malignancy. He initially refused surgical intervention and was felt to have capacity after a psychiatric evaluation. He ultimately consented to surgery and underwent a laparoscopy with right hemicolectomy, end ileostomy, mucous fistula and cholecystectomy on 10/27/14 for palliation.  Assessment/Plan:   Principal Problem:   Colonic mass / probable widely metastatic colon cancer - Status post palliative laparoscopy with right hemicolectomy, end ileostomy, mucous fistula and cholecystectomy on 10/27/14. - Pathology consistent with colorectal adenocarcinoma. - Diet advanced to clear liquids by surgery 10/29/14. Tolerating. Ostomy beginning to function.  Active Problems:   HTN (hypertension) - Continue Coreg. Blood pressure is controlled.    CAD (coronary artery disease), multivessel - Continue statin. Continue Coreg. - Resume aspirin when okay with surgery.    Chronic systolic CHF (congestive heart failure), NYHA class 4 - Status post 2-D echo on 10/25/14: EF 50%. Hypokinesis of inferolateral wall and basal inferior segment noted. LVH. - LV function markedly improved over prior echocardiogram. Improvement attributed to abstaining from alcohol.    Chronic microcytic anemia / acute blood loss anemia - Likely from acute postoperative blood loss in the setting of chronic GI blood loss.  - Hemoglobin 9.2 after receiving 1 unit of PRBCs 10/30/14.    Hypokalemia - Repleted.    Malnutrition of moderate degree/hypophosphatemia - Evaluated by dietitian 10/23/14. Continue supplements. -  TPN started by surgery. - Monitor for refeeding syndrome. Phosphorus and magnesium WNL this morning.    Escherichia coli urinary tract infection - Completed a course of therapy with Rocephin 7 days.    Peripheral vascular disease - Status post bilateral AKAs.    DVT Prophylaxis - Lovenox when okay with surgery.  Family Communication: No family at the bedside. Declines my offer to call. Disposition Plan: SNF when stable and cleared by surgery postoperatively. Will need to be eating with recovery of bowel function and off TNA at discharge, so likely will need a few more days in the hospital. Code Status:     Code Status Orders        Start     Ordered   10/22/14 2150  Full code   Continuous     10/22/14 2149        IV Access:    Peripheral IV   Procedures and diagnostic studies:   Ct Abdomen Pelvis W Contrast  10/22/2014   CLINICAL DATA:  69 year old male with right lower quadrant and periumbilical pain. Emesis  EXAM: CT ABDOMEN AND PELVIS WITH CONTRAST  TECHNIQUE: Multidetector CT imaging of the abdomen and pelvis was performed using the standard protocol following bolus administration of intravenous contrast.  CONTRAST:  47mL OMNIPAQUE IOHEXOL 300 MG/ML SOLN, 151mL OMNIPAQUE IOHEXOL 300 MG/ML SOLN  COMPARISON:  CT dated 08/21/2013  FINDINGS: Partially visualized small right pleural profusion. Top-normal cardiac size. Right cardiophrenic angle top-normal lymph nodes. No intra-abdominal free air. Small ascites.  There are innumerable hepatic hypodense lesions, increased from prior study. Some lesion such as the 3.8 x 4.5 cm hypodense lesion in the right lobe the liver adjacent the right hepatic vein likely represents a hemangioma  and others were previously characterized as cysts. Other lesions such as a 2.4 x 3.4 cm subcapsular lesion seen in the right hepatic lobe posteriorly (series 2, image 19) are not well characterized but suspicious for metastatic disease. There are nodular  implants predominantly involving the surface of the right lobe of the liver with scalloping of the liver parenchyma compatible with subcapsular implants.  The gallbladder is not visualized, likely surgically absent. The pancreas is unremarkable. Surgical clips noted adjacent the head of the pancreas. Multiple hypodense lesions noted predominantly involving the surface of the spleen concerning for metastatic implant. Left adrenal thickening/hyperplasia. There are bilateral renal cortical irregularity is. There is apparent diffuse thickening of the bladder wall which may be partly related to underdistention. Cystitis is not excluded. Correlation with urinalysis recommended. The prostate and seminal vesicles are grossly unremarkable.  There is an ill-defined irregular nodular mass measuring approximately 3.5 x 2.3 cm involving the distal transverse colon most compatible with malignancy. There is high-grade stenosis of the bowel with dilatation of the colon proximal to this mass. There is no evidence of small bowel obstruction. Scattered sigmoid diverticula without active inflammation.  There is aortoiliac atherosclerotic disease. A 2.6 cm infrarenal abdominal aortic ectasia. 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  There are nodular implants in the left lower abdomen with a combined dimension 1.9 x 4.7 cm. There is a 3.7 x 2.0 cm soft tissue implant is noted in the right lower quadrant adjacent the appendix. Multiple nodular implants are also noted adjacent to the tip of the appendix. Two 2 adjacent implants and free fluid extending from the subhepatic area. However, No definite evidence of acute appendicitis noted.  Multiple nodular densities anterior and  inferior to the spleen compatible with metastatic implants.  Retroperitoneal/right periaortic adenopathy measures 1.3 cm in short axis.  Right anterior upper thigh lipoma noted with there is degenerative changes of the spine. No acute fracture.  IMPRESSION: Distal transverse colon obstructing mass most compatible with malignancy. Further evaluation with colonoscopy and tissue sampling recommended. There is high-grade stenosis and dilatation of the colon proximal to this mass.  Peritoneal, mesenteric, and hepatic metastatic disease.  No definite CT evidence of acute appendicitis.   Electronically Signed   By: Anner Crete M.D.   On: 10/22/2014 19:59     Medical Consultants:    Psychiatry  General surgery  GI  Cardiology  Anti-Infectives:    Ceftriaxone 10/22/2014> 10/28/14  Subjective:   Christiano Blandon feels okay today. Denies current pain. No dyspnea or cough. Tolerating clear liquids.  Objective:    Filed Vitals:   10/30/14 1249 10/30/14 1430 10/30/14 2150 10/31/14 0618  BP: 148/67 118/58 136/69 128/56  Pulse: 96 87 94 87  Temp: 99.2 F (37.3 C) 98.8 F (37.1 C) 99.2 F (37.3 C) 98.1 F (36.7 C)  TempSrc: Oral Oral Oral Oral  Resp: 20 20 22 20   Height:      Weight:      SpO2: 97% 100% 99% 98%    Intake/Output Summary (Last 24 hours) at 10/31/14 0803 Last data filed at 10/31/14 0619  Gross per 24 hour  Intake    390 ml  Output    900 ml  Net   -510 ml  Exam: Gen:  NAD Cardiovascular:  RRR, II/VI SEM Respiratory:  Lungs CTAB Gastrointestinal:  Abdomen soft, ostomy with dark liquid effluent and air in the bag, wound dressings in place Extremities:  Bilateral amputee   Data Reviewed:    Labs: Basic Metabolic Panel:  Recent Labs Lab 10/27/14 0428 10/28/14 0938 10/30/14 0545 10/31/14 0528  NA 133* 136 135  --   K 3.7 4.8 4.1  --   CL 98* 106 109  --   CO2 24 22 23   --   GLUCOSE 127* 89 151*  --   BUN 20 20 12   --   CREATININE 1.04 0.92 0.62   --   CALCIUM 9.1 8.0* 7.8*  --   MG  --   --  2.0 1.9  PHOS  --   --  1.3* 3.0   GFR Estimated Creatinine Clearance: 61.7 mL/min (by C-G formula based on Cr of 0.62). Liver Function Tests:  Recent Labs Lab 10/30/14 0545  AST 64*  ALT 34  ALKPHOS 104  BILITOT 0.4  PROT 5.9*  ALBUMIN 2.0*   CBC:  Recent Labs Lab 10/27/14 0428 10/28/14 0938 10/30/14 0545 10/31/14 0528  WBC 7.1 10.6* 12.4* 14.3*  NEUTROABS  --   --  9.8*  --   HGB 9.5* 7.7* 6.9* 9.2*  HCT 29.8* 24.7* 21.0* 28.3*  MCV 78.2 77.7* 77.8* 79.7  PLT 383 280 275 243   CBG:  Recent Labs Lab 10/29/14 2354 10/30/14 0534 10/30/14 1216 10/30/14 1751 10/31/14 0616  GLUCAP 149* 126* 111* 125* 116*   Microbiology Recent Results (from the past 240 hour(s))  Urine culture     Status: None   Collection Time: 10/22/14  8:21 PM  Result Value Ref Range Status   Specimen Description URINE, CLEAN CATCH  Final   Special Requests Normal  Final   Culture   Final    >=100,000 COLONIES/mL ESCHERICHIA COLI Performed at [facility]    Report Status 10/25/2014 FINAL  Final   Organism ID, Bacteria ESCHERICHIA COLI  Final      Susceptibility   Escherichia coli - MIC*    AMPICILLIN <=2 SENSITIVE Sensitive     CEFAZOLIN <=4 SENSITIVE Sensitive     CEFTRIAXONE <=1 SENSITIVE Sensitive     CIPROFLOXACIN <=0.25 SENSITIVE Sensitive     GENTAMICIN <=1 SENSITIVE Sensitive     IMIPENEM <=0.25 SENSITIVE Sensitive     NITROFURANTOIN <=16 SENSITIVE Sensitive     TRIMETH/SULFA <=20 SENSITIVE Sensitive     AMPICILLIN/SULBACTAM <=2 SENSITIVE Sensitive     PIP/TAZO <=4 SENSITIVE Sensitive     * >=100,000 COLONIES/mL ESCHERICHIA COLI  Surgical pcr screen     Status: None   Collection Time: 10/22/14 11:13 PM  Result Value Ref Range Status   MRSA, PCR NEGATIVE NEGATIVE Final   Staphylococcus aureus NEGATIVE NEGATIVE Final    Comment:        The Xpert SA Assay (FDA approved for NASAL specimens in patients over 21 years  of age), is one component of a comprehensive surveillance program.  Test performance has been validated by [facility] for patients greater than or equal to 92 year old. It is not intended to diagnose infection nor to guide or monitor treatment.      Medications:   . antiseptic oral rinse  7 mL Mouth Rinse q12n4p  . atorvastatin  40 mg Oral QPM  . carvedilol  6.25 mg Oral BID WC  . chlorhexidine  15 mL Mouth Rinse  BID  . dicyclomine  20 mg Oral QHS  . escitalopram  5 mg Oral Daily  . folic acid  1 mg Oral Daily  . insulin aspart  0-9 Units Subcutaneous Q6H  . mirtazapine  30 mg Oral QHS  . sodium chloride  10-40 mL Intracatheter Q12H  . sodium chloride  3 mL Intravenous Q12H  . thiamine  100 mg Oral Daily   Continuous Infusions: . dextrose 5 % and 0.9 % NaCl with KCl 20 mEq/L 65 mL/hr at 10/31/14 0645  . Marland KitchenTPN (CLINIMIX-E) Adult 30 mL/hr at 10/30/14 1759   And  . fat emulsion 120 mL (10/30/14 1758)    Time spent: 25 minutes.   LOS: 9 days   [facility] Pager [phone]. If unable to reach me by pager, please call my cell phone at [phone].  *Please refer to [email or web address], password [email or web address] to get updated schedule on who will round on this patient, as hospitalists switch teams weekly. If 7PM-7AM, please contact night-coverage at [email or web address], password [email or web address] for any overnight needs.  10/31/2014, 8:03 AM

## 2014-11-01 ENCOUNTER — Encounter (HOSPITAL_COMMUNITY): Payer: Self-pay | Admitting: Internal Medicine

## 2014-11-01 DIAGNOSIS — I714 Abdominal aortic aneurysm, without rupture, unspecified: Secondary | ICD-10-CM

## 2014-11-01 HISTORY — DX: Abdominal aortic aneurysm, without rupture, unspecified: I71.40

## 2014-11-01 HISTORY — DX: Abdominal aortic aneurysm, without rupture: I71.4

## 2014-11-01 LAB — GLUCOSE, CAPILLARY
GLUCOSE-CAPILLARY: 123 mg/dL — AB (ref 65–99)
GLUCOSE-CAPILLARY: 118 mg/dL — AB (ref 65–99)
Glucose-Capillary: 135 mg/dL — ABNORMAL HIGH (ref 65–99)

## 2014-11-01 LAB — CBC
HEMATOCRIT: 28.9 % — AB (ref 39.0–52.0)
HEMOGLOBIN: 9.2 g/dL — AB (ref 13.0–17.0)
MCH: 25.6 pg — ABNORMAL LOW (ref 26.0–34.0)
MCHC: 31.8 g/dL (ref 30.0–36.0)
MCV: 80.5 fL (ref 78.0–100.0)
Platelets: 238 10*3/uL (ref 150–400)
RBC: 3.59 MIL/uL — ABNORMAL LOW (ref 4.22–5.81)
RDW: 18.3 % — ABNORMAL HIGH (ref 11.5–15.5)
WBC: 14.8 10*3/uL — ABNORMAL HIGH (ref 4.0–10.5)

## 2014-11-01 LAB — BASIC METABOLIC PANEL
ANION GAP: 6 (ref 5–15)
BUN: 10 mg/dL (ref 6–20)
CHLORIDE: 108 mmol/L (ref 101–111)
CO2: 23 mmol/L (ref 22–32)
Calcium: 8 mg/dL — ABNORMAL LOW (ref 8.9–10.3)
Creatinine, Ser: 0.61 mg/dL (ref 0.61–1.24)
GFR calc Af Amer: >60 mL/min (ref >60)
GFR calc non Af Amer: >60 mL/min (ref >60)
GLUCOSE: 139 mg/dL — AB (ref 65–99)
Potassium: 4.3 mmol/L (ref 3.5–5.1)
SODIUM: 137 mmol/L (ref 135–145)

## 2014-11-01 LAB — PHOSPHORUS: Phosphorus: 2.7 mg/dL (ref 2.5–4.6)

## 2014-11-01 MED ORDER — POTASSIUM CHLORIDE IN NACL 20-0.9 MEQ/L-% IV SOLN
INTRAVENOUS | Status: DC
  Administered 2014-11-02 – 2014-11-04 (×3): via INTRAVENOUS
  Filled 2014-11-01 (×6): qty 1000

## 2014-11-01 MED ORDER — SODIUM PHOSPHATE 3 MMOLE/ML IV SOLN: 15.0000 mmol | Freq: Once | INTRAVENOUS | Status: DC

## 2014-11-01 MED ORDER — FAT EMULSION 20 % IV EMUL
240.0000 mL | INTRAVENOUS | Status: AC
  Administered 2014-11-01: 240 mL via INTRAVENOUS
  Filled 2014-11-01: qty 250

## 2014-11-01 MED ORDER — TRACE MINERALS CR-CU-MN-SE-ZN 10-1000-500-60 MCG/ML IV SOLN
INTRAVENOUS | Status: AC
  Administered 2014-11-01: 17:00:00 via INTRAVENOUS
  Filled 2014-11-01: qty 1200

## 2014-11-01 NOTE — Progress Notes (Signed)
PARENTERAL NUTRITION CONSULT NOTE  Pharmacy Consult for TPN Indication: Prolonged ileus  No Known Allergies  Patient Measurements:   Height: 5\' 11"  (180.3 cm) Weight: 112 lb 1.6 oz (50.848 kg) IBW/kg (Calculated) : 75.3  IBW calculated based on height of 5'11". Current total body weight much lower than IBW due to bilateral AKAs.  Vital Signs: Temp: 99.4 F (37.4 C) (07/31 0450) Temp Source: Oral (07/31 0450) BP: 142/75 mmHg (07/31 0450) Pulse Rate: 79 (07/31 0450) Intake/Output from previous day: 07/30 0701 - 07/31 0700 In: 9201.9 [P.O.:420; I.V.:6504.6; NTI:1443.1] Out: 2375 [Urine:1225; Stool:1150] Intake/Output from this shift:    Labs:  Recent Labs  10/30/14 0545 10/31/14 0528 11/01/14 0617  WBC 12.4* 14.3* 14.8*  HGB 6.9* 9.2* 9.2*  HCT 21.0* 28.3* 28.9*  PLT 275 243 238     Recent Labs  10/30/14 0545 10/31/14 0528 11/01/14 0617  NA 135 137 137  K 4.1 4.1 4.3  CL 109 109 108  CO2 23 23 23   GLUCOSE 151* 112* 139*  BUN 12 9 10   CREATININE 0.62 0.54* 0.61  CALCIUM 7.8* 7.7* 8.0*  MG 2.0 1.9  --   PHOS 1.3* 3.0 2.7  PROT 5.9*  --   --   ALBUMIN 2.0*  --   --   AST 64*  --   --   ALT 34  --   --   ALKPHOS 104  --   --   BILITOT 0.4  --   --   PREALBUMIN 4.1*  --   --   TRIG 158*  --   --    Estimated Creatinine Clearance: 61.7 mL/min (by C-G formula based on Cr of 0.61).    Recent Labs  10/31/14 1140 10/31/14 1738 11/01/14 0629  GLUCAP 136* 139* 135*   Infusions:  . 0.9 % NaCl with KCl 20 mEq / L 55 mL/hr at 10/31/14 1749  . Marland KitchenTPN (CLINIMIX-E) Adult 40 mL/hr at 10/31/14 1704   And  . fat emulsion 240 mL (10/31/14 1704)   Insulin Requirements: 2 units SSI yesterday  Current Nutrition:  CLD with comment "start with sips of clears" ordered 7/28  IVF: NS + 20 mEq KCl at 55 ml/hr  Central access: PICC 7/28  TPN start date: 7/28  ASSESSMENT                                                                                                           HPI: 69 yo male, bilateral AK amputee, admitted 10/22/14 with abdominal pain. CT of the abdomen and pelvis showed colonic mass revealed by pathology as adenocarcinoma. On 10/27/14, he underwent a palliative right hemicolectomy, end ileostomy, mucous fistula and cholecystectomy. Pharmacy is consulted 7/28 to begin TPN.  Significant events:  7/30: minimal improvement in intake; no BS.  Surgery not advancing diet, although ostomy output improving  Today: 11/01/2014   Glucose: CBGs appear to be slightly higher after advancing TPN, but still at goal < 150  Electrolytes: All lytes wnl, although phos dropped to borderline low (CorrCa 9.6)  Renal: SCr WNL; UOP good; +5.5L since admit  LFTs: unremarkable; Albumin low  TGs: 143 (7/21), 158 (7/29)  Prealbumin: 4.1 (7/29)  NUTRITIONAL GOALS                                                                                             RD recs (7/28): 1100-1300 kcal, 55-65 g protein per day, fluid 1.7 L/day  Clinimix 5/15 at a goal rate of 5ml/hr + 20% fat emulsion at 23ml/hr to provide: 60g/day protein, 1332Kcal/day.  PLAN                                                                                                                         At 1800 today:  Advance Clinimix E 5/15 formulation to 50 ml/hr;  20% fat emulsion at 10 ml/hr  Patient at risk for refeeding syndrome given NPO ~7 days prior to start of TPN, but so far tolerating well per lytes.  TPN to contain standard multivitamins and trace elements - hold PO multivitamin, vitamin C and zinc while receiving in TPN.  Reduce IVF to 40 ml/hr  Continue CBG checks and sensitive scale SSI q6h  No supplemental lytes today - will probably need Phos tomorrow  TPN lab panels on Mondays & Thursdays.  F/u daily  F/u advancement of oral diet.  Reuel Boom, PharmD, BCPS Pager: [phone] 11/01/2014, 7:11 AM

## 2014-11-01 NOTE — Progress Notes (Signed)
Progress Note   Rodney [name] BWI:[id number] DOB: 1945/07/21 DOA: 10/22/2014 PCP: [name], NP   Brief Narrative:   Rodney [name] is an 69 y.o. male with a PMH of hypertension, alcohol abuse, PVD and CHF who was admitted 10/22/14 with chief complaint of abdominal pain. Workup in the ED included a CT of the abdomen and pelvis which showed a distal transverse colonic mass with high-grade stenosis concerning for malignancy. He initially refused surgical intervention and was felt to have capacity after a psychiatric evaluation. He ultimately consented to surgery and underwent a laparoscopy with right hemicolectomy, end ileostomy, mucous fistula and cholecystectomy on 10/27/14 for palliation. The patient is currently on TNA slow recovery of bowel function. Plan is to discharge him to a SNF when cleared by surgery.  Assessment/Plan:   Principal Problem:   Colorectal adenocarcinoma with widespread metastasis - Initial CT showed a transverse colonic mass with liver, spleen, and intra-abdominal metastasis. - S/P palliative laparoscopy with right hemicolectomy, end ileostomy, mucous fistula and cholecystectomy on 10/27/14. - Pathology consistent with colorectal adenocarcinoma. - Diet advanced to clear liquids by surgery 10/29/14. Tolerating. Ostomy beginning to function. - We'll need outpatient oncology follow-up once healed from surgery, although likely a poor candidate for treatment. - Abdominal wound open, looks "soupy", monitor for infection (not on empiric antibiotics), currently afebrile. Monitor WBC.  Active Problems:   Abdominal aortic aneurysm - Noted incidentally on CT scan. 2.6 cm.    HTN (hypertension) - Continue Coreg. Blood pressure is controlled.    CAD (coronary artery disease), multivessel - Continue statin. Continue Coreg. - Resume aspirin when okay with surgery.    Chronic systolic CHF (congestive heart failure), NYHA class 4 - Status post 2-D echo on 10/25/14: EF 50%.  Hypokinesis of inferolateral wall and basal inferior segment noted. LVH. - LV function markedly improved over prior echocardiogram. Improvement attributed to abstaining from alcohol.    Chronic microcytic anemia / acute blood loss anemia - Likely from acute postoperative blood loss in the setting of chronic GI blood loss.  - Hemoglobin remained stable at 9.2 after receiving 1 unit of PRBCs 10/30/14.    Hypokalemia - Repleted.    Malnutrition of moderate degree/hypophosphatemia - Evaluated by dietitian 10/23/14. Continue supplements. - TPN started by surgery. - Monitor for refeeding syndrome. Phosphorus WNL this morning.    Escherichia coli urinary tract infection - Completed a course of therapy with Rocephin 7 days.    Peripheral vascular disease - Status post bilateral AKAs.    DVT Prophylaxis - Lovenox when okay with surgery. Unable to do SCDs given bilateral AKAs.  Family Communication: No family at the bedside. Declines my offer to call. Disposition Plan: SNF when stable and cleared by surgery postoperatively. Will need to be eating with recovery of bowel function and off TNA at discharge, possibly in the next 24-48 hours. Code Status:     Code Status Orders        Start     Ordered   10/22/14 2150  Full code   Continuous     10/22/14 2149        IV Access:    Peripheral IV   Procedures and diagnostic studies:   Ct Abdomen Pelvis W Contrast  10/22/2014   CLINICAL DATA:  69 year old male with right lower quadrant and periumbilical pain. Emesis  EXAM: CT ABDOMEN AND PELVIS WITH CONTRAST  TECHNIQUE: Multidetector CT imaging of the abdomen and pelvis was performed using the standard protocol following bolus administration  of intravenous contrast.  CONTRAST:  46mL OMNIPAQUE IOHEXOL 300 MG/ML SOLN, 172mL OMNIPAQUE IOHEXOL 300 MG/ML SOLN  COMPARISON:  CT dated 08/21/2013  FINDINGS: Partially visualized small right pleural profusion. Top-normal cardiac size. Right  cardiophrenic angle top-normal lymph nodes. No intra-abdominal free air. Small ascites.  There are innumerable hepatic hypodense lesions, increased from prior study. Some lesion such as the 3.8 x 4.5 cm hypodense lesion in the right lobe the liver adjacent the right hepatic vein likely represents a hemangioma and others were previously characterized as cysts. Other lesions such as a 2.4 x 3.4 cm subcapsular lesion seen in the right hepatic lobe posteriorly (series 2, image 19) are not well characterized but suspicious for metastatic disease. There are nodular implants predominantly involving the surface of the right lobe of the liver with scalloping of the liver parenchyma compatible with subcapsular implants.  The gallbladder is not visualized, likely surgically absent. The pancreas is unremarkable. Surgical clips noted adjacent the head of the pancreas. Multiple hypodense lesions noted predominantly involving the surface of the spleen concerning for metastatic implant. Left adrenal thickening/hyperplasia. There are bilateral renal cortical irregularity is. There is apparent diffuse thickening of the bladder wall which may be partly related to underdistention. Cystitis is not excluded. Correlation with urinalysis recommended. The prostate and seminal vesicles are grossly unremarkable.  There is an ill-defined irregular nodular mass measuring approximately 3.5 x 2.3 cm involving the distal transverse colon most compatible with malignancy. There is high-grade stenosis of the bowel with dilatation of the colon proximal to this mass. There is no evidence of small bowel obstruction. Scattered sigmoid diverticula without active inflammation.  There is aortoiliac atherosclerotic disease. A 2.6 cm infrarenal abdominal aortic ectasia. 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  There are nodular implants in the left lower abdomen with a combined dimension 1.9 x 4.7 cm. There is a 3.7 x 2.0 cm soft tissue implant is noted in the right lower quadrant adjacent the appendix. Multiple nodular implants are also noted adjacent to the tip of the appendix. Two 2 adjacent implants and free fluid extending from the subhepatic area. However, No definite evidence of acute appendicitis noted.  Multiple nodular densities anterior and inferior to the spleen compatible with metastatic implants.  Retroperitoneal/right periaortic adenopathy measures 1.3 cm in short axis.  Right anterior upper thigh lipoma noted with there is degenerative changes of the spine. No acute fracture.  IMPRESSION: Distal transverse colon obstructing mass most compatible with malignancy. Further evaluation with colonoscopy and tissue sampling recommended. There is high-grade stenosis and dilatation of the colon proximal to this mass.  Peritoneal, mesenteric, and hepatic metastatic disease.  No definite CT evidence of acute appendicitis.   Electronically Signed   By: Anner Crete M.D.   On: 10/22/2014 19:59     Medical Consultants:    Psychiatry  General surgery  GI  Cardiology  Anti-Infectives:    Ceftriaxone 10/22/2014> 10/28/14  Subjective:   Rodney [name] denies current pain, dyspnea.  Drank some coffee this morning.  Objective:    Filed Vitals:   10/31/14 0618 10/31/14 1325 10/31/14 2103 11/01/14 0450  BP: 128/56 127/64 156/71 142/75  Pulse: 87 88 98 79  Temp: 98.1 F (36.7 C) 98.4 F (36.9 C) 99.1  F (37.3 C) 99.4 F (37.4 C)  TempSrc: Oral Oral Oral Oral  Resp: 20 18 20 18   Height:      Weight:      SpO2: 98% 96% 98% 98%    Intake/Output Summary (Last 24 hours) at 11/01/14 0717 Last data filed at 11/01/14 3235  Gross per 24 hour  Intake 9201.9 ml  Output   2375 ml  Net  6826.9 ml    Exam: Gen:  NAD Cardiovascular:  RRR, II/VI SEM Respiratory:  Lungs CTAB Gastrointestinal:  Abdomen soft, +BS, ostomy draining Extremities:  Bilateral amputee   Data Reviewed:    Labs: Basic Metabolic Panel:  Recent Labs Lab 10/27/14 0428 10/28/14 0938 10/30/14 0545 10/31/14 0528 11/01/14 0617  NA 133* 136 135 137 137  K 3.7 4.8 4.1 4.1 4.3  CL 98* 106 109 109 108  CO2 24 22 23 23 23   GLUCOSE 127* 89 151* 112* 139*  BUN 20 20 12 9 10   CREATININE 1.04 0.92 0.62 0.54* 0.61  CALCIUM 9.1 8.0* 7.8* 7.7* 8.0*  MG  --   --  2.0 1.9  --   PHOS  --   --  1.3* 3.0 2.7   GFR Estimated Creatinine Clearance: 61.7 mL/min (by C-G formula based on Cr of 0.61). Liver Function Tests:  Recent Labs Lab 10/30/14 0545  AST 64*  ALT 34  ALKPHOS 104  BILITOT 0.4  PROT 5.9*  ALBUMIN 2.0*   CBC:  Recent Labs Lab 10/27/14 0428 10/28/14 0938 10/30/14 0545 10/31/14 0528 11/01/14 0617  WBC 7.1 10.6* 12.4* 14.3* 14.8*  NEUTROABS  --   --  9.8*  --   --   HGB 9.5* 7.7* 6.9* 9.2* 9.2*  HCT 29.8* 24.7* 21.0* 28.3* 28.9*  MCV 78.2 77.7* 77.8* 79.7 80.5  PLT 383 280 275 243 238   CBG:  Recent Labs Lab 10/30/14 1751 10/31/14 0616 10/31/14 1140 10/31/14 1738 11/01/14 0629  GLUCAP 125* 116* 136* 139* 135*   Microbiology Recent Results (from the past 240 hour(s))  Urine culture     Status: None   Collection Time: 10/22/14  8:21 PM  Result Value Ref Range Status   Specimen Description URINE, CLEAN CATCH  Final   Special Requests Normal  Final   Culture   Final    >=100,000 COLONIES/mL ESCHERICHIA COLI Performed at [facility]    Report Status 10/25/2014 FINAL  Final   Organism ID, Bacteria ESCHERICHIA COLI  Final      Susceptibility   Escherichia coli - MIC*    AMPICILLIN <=2 SENSITIVE Sensitive     CEFAZOLIN <=4 SENSITIVE Sensitive     CEFTRIAXONE <=1 SENSITIVE Sensitive     CIPROFLOXACIN <=0.25 SENSITIVE Sensitive     GENTAMICIN <=1  SENSITIVE Sensitive     IMIPENEM <=0.25 SENSITIVE Sensitive     NITROFURANTOIN <=16 SENSITIVE Sensitive     TRIMETH/SULFA <=20 SENSITIVE Sensitive     AMPICILLIN/SULBACTAM <=2 SENSITIVE Sensitive     PIP/TAZO <=4 SENSITIVE Sensitive     * >=100,000 COLONIES/mL ESCHERICHIA COLI  Surgical pcr screen     Status: None   Collection Time: 10/22/14 11:13 PM  Result Value Ref Range Status   MRSA, PCR NEGATIVE NEGATIVE Final   Staphylococcus aureus NEGATIVE NEGATIVE Final    Comment:        The Xpert SA Assay (FDA approved for NASAL specimens in patients over 56 years of age), is one component of a comprehensive surveillance program.  Test  performance has been validated by [facility] for patients greater than or equal to 59 year old. It is not intended to diagnose infection nor to guide or monitor treatment.      Medications:   . antiseptic oral rinse  7 mL Mouth Rinse q12n4p  . atorvastatin  40 mg Oral QPM  . carvedilol  6.25 mg Oral BID WC  . chlorhexidine  15 mL Mouth Rinse BID  . dicyclomine  20 mg Oral QHS  . escitalopram  5 mg Oral Daily  . folic acid  1 mg Oral Daily  . insulin aspart  0-9 Units Subcutaneous Q6H  . mirtazapine  30 mg Oral QHS  . sodium chloride  10-40 mL Intracatheter Q12H  . sodium chloride  3 mL Intravenous Q12H  . thiamine  100 mg Oral Daily   Continuous Infusions: . 0.9 % NaCl with KCl 20 mEq / L 55 mL/hr at 10/31/14 1749  . Marland KitchenTPN (CLINIMIX-E) Adult 40 mL/hr at 10/31/14 1704   And  . fat emulsion 240 mL (10/31/14 1704)    Time spent: 25 minutes.   LOS: 10 days   [facility] Pager [phone]. If unable to reach me by pager, please call my cell phone at [phone].  *Please refer to [email or web address], password [email or web address] to get updated schedule on who will round on this patient, as hospitalists switch teams weekly. If 7PM-7AM, please contact night-coverage at [email or web address], password [email or web address] for any overnight needs.  11/01/2014, 7:17 AM

## 2014-11-01 NOTE — Progress Notes (Signed)
General Surgery Note  LOS: 10 days  POD -  5 Days Post-Op  Assessment/Plan: 1.  DIAGNOSTIC LAPAROSCOPY, RIGHT HEMICOLECTOMY WITH MUCOUS FISTULA - 10/27/2014 - [facility]  WBC - 14,800 - 11/01/2014  Wound still soupy.  Will increase dressing changes to TID.  May need to remove some staples  Ileostomy functioning.  Will advance to reg diet.   2. Metastatic disease.  Path report - [id number] - omentum positive for adenoca 3. 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 Kellie Simmering Right AKA - 03/26/2013 - T. Early  4. HTN 5. Smokes - 1/2 ppd 6. Chronic systolic heart failure 7.  Anemia - Hgb - 9.2 - 11/01/2014 8.  On TPN for malnutrition.  Will stop TPN since I am advancing his diet.   Principal Problem:   Primary colorectal adenocarcinoma Active Problems:   HTN (hypertension)   CAD (coronary artery disease)   Colonic mass   Chronic systolic CHF (congestive heart failure), NYHA class 4   Hypokalemia   Malnutrition of moderate degree   Escherichia coli urinary tract infection   Microcytic anemia   Atherosclerotic peripheral vascular disease status post bilateral AKA's   Postoperative anemia due to acute blood loss   Hypophosphatemia   Abdominal aortic aneurysm  Subjective:  Alert.  No specific complaint. Objective:   Filed Vitals:   11/01/14 0450  BP: 142/75  Pulse: 79  Temp: 99.4 F (37.4 C)  Resp: 18     Intake/Output from previous day:  07/30 0701 - 07/31 0700 In: 9201.9 [P.O.:420; I.V.:6504.6; TPN:2277.3] Out: 2375 [Urine:1225; Stool:1150]  Intake/Output this shift:      Physical Exam:   General: Older AA M   HEENT: Normal. Pupils equal. .   Lungs: Clear.   Abdomen: Soft.  Has BS.   Wound: Right sided ileostomy - with bag 1/2 full.   Mid line wound looks soupy - I cleaned with betadine.   Lab Results:     Recent Labs  10/31/14 0528 11/01/14 0617  WBC 14.3* 14.8*  HGB 9.2* 9.2*  HCT 28.3* 28.9*  PLT 243 238     BMET    Recent Labs  10/31/14 0528 11/01/14 0617  NA 137 137  K 4.1 4.3  CL 109 108  CO2 23 23  GLUCOSE 112* 139*  BUN 9 10  CREATININE 0.54* 0.61  CALCIUM 7.7* 8.0*    PT/INR  No results for input(s): LABPROT, INR in the last 72 hours.  ABG  No results for input(s): PHART, HCO3 in the last 72 hours.  Invalid input(s): PCO2, PO2   Studies/Results:  No results found.   Anti-infectives:   Anti-infectives    Start     Dose/Rate Route Frequency Ordered Stop   10/27/14 1630  metroNIDAZOLE (FLAGYL) IVPB 500 mg     500 mg 100 mL/hr over 60 Minutes Intravenous 3 times daily 10/27/14 1559 10/27/14 1743   10/27/14 0915  [MAR Hold]  metroNIDAZOLE (FLAGYL) IVPB 500 mg     (MAR Hold since 10/27/14 1122)   500 mg 100 mL/hr over 60 Minutes Intravenous 30 min pre-op 10/27/14 0837 10/27/14 1328   10/23/14 2200  cefTRIAXone (ROCEPHIN) 1 g in dextrose 5 % 50 mL IVPB  Status:  Discontinued     1 g 100 mL/hr over 30 Minutes Intravenous Every 24 hours 10/22/14 2121 10/28/14 0816   10/22/14 2030  cefTRIAXone (ROCEPHIN) 1 g in dextrose 5 % 50 mL IVPB     1 g 100 mL/hr over 30  Minutes Intravenous  Once 10/22/14 2017 10/22/14 2145      Alphonsa Overall, MD, FACS Pager: [phone] Surgery Office: [phone] 11/01/2014

## 2014-11-02 DIAGNOSIS — D62 Acute posthemorrhagic anemia: Secondary | ICD-10-CM

## 2014-11-02 DIAGNOSIS — N390 Urinary tract infection, site not specified: Secondary | ICD-10-CM

## 2014-11-02 DIAGNOSIS — C19 Malignant neoplasm of rectosigmoid junction: Principal | ICD-10-CM

## 2014-11-02 DIAGNOSIS — E440 Moderate protein-calorie malnutrition: Secondary | ICD-10-CM

## 2014-11-02 DIAGNOSIS — I10 Essential (primary) hypertension: Secondary | ICD-10-CM

## 2014-11-02 DIAGNOSIS — B9620 Unspecified Escherichia coli [E. coli] as the cause of diseases classified elsewhere: Secondary | ICD-10-CM

## 2014-11-02 LAB — TYPE AND SCREEN
ABO/RH(D): O POS
Antibody Screen: NEGATIVE
UNIT DIVISION: 0

## 2014-11-02 LAB — GLUCOSE, CAPILLARY
GLUCOSE-CAPILLARY: 119 mg/dL — AB (ref 65–99)
GLUCOSE-CAPILLARY: 123 mg/dL — AB (ref 65–99)
Glucose-Capillary: 96 mg/dL (ref 65–99)

## 2014-11-02 MED ORDER — ENSURE ENLIVE PO LIQD
237.0000 mL | Freq: Two times a day (BID) | ORAL | Status: DC
  Administered 2014-11-02 – 2014-11-10 (×8): 237 mL via ORAL

## 2014-11-02 NOTE — Progress Notes (Signed)
Patient refused to have blood drawn this morning. Was due PALB, TRG, CMP, Mg, P+ and CBC with Diff.

## 2014-11-02 NOTE — Care Management Important Message (Signed)
Important Message  Patient Details  Name: Derrall Hicks MRN: [id number] Date of Birth: 12/18/1945   Medicare Important Message Given:  Yes-fourth notification given    Shelda Altes 11/02/2014, 2:35 [place] Message  Patient Details  Name: Lavelle Akel MRN: [id number] Date of Birth: [birth year]   Medicare Important Message Given:  Yes-fourth notification given    Shelda Altes 11/02/2014, 2:35 PM

## 2014-11-02 NOTE — Progress Notes (Signed)
6 Days Post-Op  Subjective: He complained they took his wheelchair, he likes to get up to chair.  Drainage from abdominal wound on dressing.  Tolerating PO's well TNA being discontinued.  Objective: Vital signs in last 24 hours: Temp:  [98.9 F (37.2 C)-99.4 F (37.4 C)] 99.4 F (37.4 C) (08/01 1318) Pulse Rate:  [90-99] 97 (08/01 1318) Resp:  [16] 16 (08/01 1318) BP: (119-169)/(64-70) 119/64 mmHg (08/01 1318) SpO2:  [98 %-99 %] 98 % (08/01 1318) Last BM Date: 11/01/14 480 PO  Regular diet 725 stool Afebrile, VSS No labs Awaiting AM labs  He refused again  Intake/Output from previous day: 07/31 0701 - 08/01 0700 In: 2702.5 [P.O.:480; I.V.:1008.1; TPN:1214.4] Out: 2325 [Urine:1600; Stool:725] Intake/Output this shift: Total I/O In: 934 [P.O.:240; I.V.:294; TPN:400] Out: 1000 [Urine:400; Stool:600]  General appearance: alert, cooperative and no distress GI: soft,  ileostomy working well taking soft diet.  Drainage from the open wound, Wicks out.  I opened the majority of the  wound .  he still has a few staples in port sites and upper incision.  Lab Results:   Recent Labs  10/31/14 0528 11/01/14 0617  WBC 14.3* 14.8*  HGB 9.2* 9.2*  HCT 28.3* 28.9*  PLT 243 238    BMET  Recent Labs  10/31/14 0528 11/01/14 0617  NA 137 137  K 4.1 4.3  CL 109 108  CO2 23 23  GLUCOSE 112* 139*  BUN 9 10  CREATININE 0.54* 0.61  CALCIUM 7.7* 8.0*   PT/INR No results for input(s): LABPROT, INR in the last 72 hours.   Recent Labs Lab 10/30/14 0545  AST 64*  ALT 34  ALKPHOS 104  BILITOT 0.4  PROT 5.9*  ALBUMIN 2.0*     Lipase     Component Value Date/Time   LIPASE 30 10/22/2014 1707     Studies/Results: No results found.  Medications: . antiseptic oral rinse  7 mL Mouth Rinse q12n4p  . atorvastatin  40 mg Oral QPM  . carvedilol  6.25 mg Oral BID WC  . chlorhexidine  15 mL Mouth Rinse BID  . dicyclomine  20 mg Oral QHS  . escitalopram  5 mg Oral Daily  .  feeding supplement (ENSURE ENLIVE)  237 mL Oral BID BM  . folic acid  1 mg Oral Daily  . insulin aspart  0-9 Units Subcutaneous Q6H  . mirtazapine  30 mg Oral QHS  . sodium chloride  10-40 mL Intracatheter Q12H  . thiamine  100 mg Oral Daily   . Marland KitchenTPN (CLINIMIX-E) Adult 50 mL/hr at 11/01/14 1702   And  . fat emulsion 240 mL (11/01/14 1702)  . 0.9 % NaCl with KCl 20 mEq / L 40 mL/hr at 11/02/14 1308   Pathology: 1. Omentum, resection for tumor - OMENTAL FIBROFATTY SOFT TISSUE, POSITIVE FOR METASTATIC ADENOCARCINOMA. 2. Colon, segmental resection, right colon; proximal transverse colon; piece of gallbladder - BENIGN COLON; NEGATIVE FOR DYSPLASIA OR MALIGNANCY, SEE COMMENT. - FOURTEEN LYMPH NODES, NEGATIVE FOR TUMOR (0/14). - ONE DISCONTINUOUS PERI-COLONIC SOFT TISSUE TUMOR DEPOSIT PRESENT. - TUBULAR ADENOMA (X1) 1.2 CM; NEGATIVE FOR HIGH GRADE DYSPLASIA OR MALIGNANCY. - SURGICAL MARGINS, NEGATIVE FOR DYSPLASIA OR MALIGNANCY. - METASTATIC TUMOR INVOLVING GALLBLADDER AND APPENDIX. pTX, pN1c, pMX  Assessment/Plan Obstructing colon cancer with peritoneal carcinomatosis Diagnostic laparoscopy, right hemicolectomy, end ileostomy, mucous fistula, cholecystectomy, 10/27/14, Dr. Barry Dienes POD 6 Post op ileus PVOD/bilateral AKA Hypertension Hx of tobacco ongoing use ETOH use Hx of CHF Anemia - being transfused  1 unit of PRBC now. Malnutrition Hospital day 7 Pre albumin 4- On TNA Antibiotics: 6 days of ceftriaxone, 2 doses of flagyl pre op. All currently discontinued DVT: None anemia/no heparin, bilateral LE Amputations/no SCD    Plan:  Wet to dry dressing, advance diet, we will need to be sure he doesn't become dehydrated as ileostomy becomes more active.  Wet to dry dressings Bid to open wound.  He is getting close to going home from our standpoint.  At this point we could call Oncology and start getting his care lined up for him post surgery.  With bilateral amputations he obviously has  transportation issues.  He lives in assisted living currently.    LOS: 11 days    Rodney [name] 11/02/2014

## 2014-11-02 NOTE — Progress Notes (Signed)
CSW continuing to follow.   CSW followed up with pt at bedside to inquire if pt had made decision about SNF. Pt states that he has not yet made a decision about SNF. Pt states that the plans to speak with his cousin today about SNF. CSW inquired with pt if CSW could contact pt cousin and pt declined. CSW discussed with pt that it will be important to make a decision regarding placement soon. Pt remains guarded in making a decision.  CSW to continue to follow to provide support and assist with pt disposition needs.  Alison Murray, MSW, [place] [facility] [phone]

## 2014-11-02 NOTE — Consult Note (Signed)
WOC ostomy follow up Stoma type/location: RLQ ileostomy Stomal assessment/size: 1 and 5/8 inch oval stoma, red, moist.  Slightly budded and near midline wound. Peristomal assessment: stoma is only slightly budded. Treatment options for stomal/peristomal skin: skin barrier ring Output: brown liquid effluent with few stool flecks. Ostomy pouching: 2pc.2 and 1/4 inch pouching system with skin barrier ring.  Education provided: Patient is assisted to perform Lock and Roll closure today.  This is very difficult for patient as he has long fingernails and little dexterity.  He observes the pouch removal and pouch preparation process, but does not appear to be grasping process.  RN states that patient had ostomy pouched changed at least once and perhaps twice over weekend.  Patient is instructed to call for RN or NT to empty ostomy pouching system when it becomes 1/3 to 1/2 full. It is clear that patient is going to require assistance with pouch management in the next care setting. Enrolled patient in [facility] Start Discharge program: Yes [place] nursing team will follow, and will remain available to this patient, the nursing, surgical and medical teams.   Thanks, Maudie Flakes, MSN, RN, [place], [place], [place] [phone])

## 2014-11-02 NOTE — Progress Notes (Addendum)
Progress Note   Rodney [name] TJQ:[id number] DOB: 1945-11-11 DOA: 10/22/2014 PCP: [name], NP   Brief Narrative:   Rodney [name] is an 69 y.o. male with a PMH of hypertension, alcohol abuse, PVD and CHF who was admitted 10/22/14 with chief complaint of abdominal pain. Workup in the ED included a CT of the abdomen and pelvis which showed a distal transverse colonic mass with high-grade stenosis concerning for malignancy. He initially refused surgical intervention and was felt to have capacity after a psychiatric evaluation. He ultimately consented to surgery and underwent a laparoscopy with right hemicolectomy, end ileostomy, mucous fistula and cholecystectomy on 10/27/14 for palliation. The patient is currently on TNA slow recovery of bowel function. Plan is to discharge him to a SNF when cleared by surgery.  Assessment/Plan:   Principal Problem:   Colorectal adenocarcinoma with widespread metastasis - Initial CT showed a transverse colonic mass with liver, spleen, and intra-abdominal metastasis. - S/P palliative laparoscopy with right hemicolectomy, end ileostomy, mucous fistula and cholecystectomy on 10/27/14. - Pathology consistent with colorectal adenocarcinoma. Rodney [name] need outpatient oncology follow-up once healed from surgery, although likely a poor candidate for treatment. - Abdominal wound open, looks "soupy", monitor for infection (not on empiric antibiotics), currently afebrile. Monitor WBC. -Diet advanced /tpn/ostomy management per sugery  Active Problems:   Abdominal aortic aneurysm - Noted incidentally on CT scan. 2.6 cm.    HTN (hypertension) - Continue Coreg. Blood pressure is controlled.    CAD (coronary artery disease), multivessel - Continue statin. Continue Coreg. - Resume aspirin when okay with surgery.    Chronic systolic CHF (congestive heart failure), NYHA class 4 - Status post 2-D echo on 10/25/14: EF 50%. Hypokinesis of inferolateral wall and basal  inferior segment noted. LVH. - LV function markedly improved over prior echocardiogram. Improvement attributed to abstaining from alcohol.    Chronic microcytic anemia / acute blood loss anemia - Likely from acute postoperative blood loss in the setting of chronic GI blood loss.  - Hemoglobin remained stable at 9.2 after receiving 1 unit of PRBCs 10/30/14.    Hypokalemia - Repleted.    Malnutrition of moderate degree/hypophosphatemia - Evaluated by dietitian 10/23/14. Continue supplements. - TPN started by surgery. - Monitor for refeeding syndrome. Phosphorus WNL this morning.    Escherichia coli urinary tract infection - Completed a course of therapy with Rocephin 7 days.    Peripheral vascular disease - Status post bilateral AKAs.    DVT Prophylaxis - Lovenox when okay with surgery. Unable to do SCDs given bilateral AKAs.  Family Communication: No family at the bedside. Declines my offer to call. Disposition Plan: SNF when stable and cleared by surgery postoperatively. Will need to be eating with recovery of bowel function and off TNA at discharge, possibly in the next 24-48 hours. Code Status:     Code Status Orders        Start     Ordered   10/22/14 2150  Full code   Continuous     10/22/14 2149        IV Access:    Peripheral IV   Procedures and diagnostic studies:   Ct Abdomen Pelvis W Contrast  10/22/2014   CLINICAL DATA:  69 year old male with right lower quadrant and periumbilical pain. Emesis  EXAM: CT ABDOMEN AND PELVIS WITH CONTRAST  TECHNIQUE: Multidetector CT imaging of the abdomen and pelvis was performed using the standard protocol following bolus administration of intravenous contrast.  CONTRAST:  63mL OMNIPAQUE  IOHEXOL 300 MG/ML SOLN, 19mL OMNIPAQUE IOHEXOL 300 MG/ML SOLN  COMPARISON:  CT dated 08/21/2013  FINDINGS: Partially visualized small right pleural profusion. Top-normal cardiac size. Right cardiophrenic angle top-normal lymph nodes. No  intra-abdominal free air. Small ascites.  There are innumerable hepatic hypodense lesions, increased from prior study. Some lesion such as the 3.8 x 4.5 cm hypodense lesion in the right lobe the liver adjacent the right hepatic vein likely represents a hemangioma and others were previously characterized as cysts. Other lesions such as a 2.4 x 3.4 cm subcapsular lesion seen in the right hepatic lobe posteriorly (series 2, image 19) are not well characterized but suspicious for metastatic disease. There are nodular implants predominantly involving the surface of the right lobe of the liver with scalloping of the liver parenchyma compatible with subcapsular implants.  The gallbladder is not visualized, likely surgically absent. The pancreas is unremarkable. Surgical clips noted adjacent the head of the pancreas. Multiple hypodense lesions noted predominantly involving the surface of the spleen concerning for metastatic implant. Left adrenal thickening/hyperplasia. There are bilateral renal cortical irregularity is. There is apparent diffuse thickening of the bladder wall which may be partly related to underdistention. Cystitis is not excluded. Correlation with urinalysis recommended. The prostate and seminal vesicles are grossly unremarkable.  There is an ill-defined irregular nodular mass measuring approximately 3.5 x 2.3 cm involving the distal transverse colon most compatible with malignancy. There is high-grade stenosis of the bowel with dilatation of the colon proximal to this mass. There is no evidence of small bowel obstruction. Scattered sigmoid diverticula without active inflammation.  There is aortoiliac atherosclerotic disease. A 2.6 cm infrarenal abdominal aortic ectasia. 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  There are nodular implants in the left lower abdomen with a combined dimension 1.9 x 4.7 cm. There is a 3.7 x 2.0 cm soft tissue implant is noted in the right lower quadrant adjacent the appendix. Multiple nodular implants are also noted adjacent to the tip of the appendix. Two 2 adjacent implants and free fluid extending from the subhepatic area. However, No definite evidence of acute appendicitis noted.  Multiple nodular densities anterior and inferior to the spleen compatible with metastatic implants.  Retroperitoneal/right periaortic adenopathy measures 1.3 cm in short axis.  Right anterior upper thigh lipoma noted with there is degenerative changes of the spine. No acute fracture.  IMPRESSION: Distal transverse colon obstructing mass most compatible with malignancy. Further evaluation with colonoscopy and tissue sampling recommended. There is high-grade stenosis and dilatation of the colon proximal to this mass.  Peritoneal, mesenteric, and hepatic metastatic disease.  No definite CT evidence of acute appendicitis.   Electronically Signed   By: Anner Crete M.D.   On: 10/22/2014 19:59     Medical Consultants:    Psychiatry  General surgery  GI  Cardiology  Anti-Infectives:    Ceftriaxone 10/22/2014> 10/28/14  Subjective:   Rodney [name] denies current pain, dyspnea.  Drank some coffee this morning. Reported poor appetite, poor insight about cancer diagnosis.  Objective:    Filed Vitals:   11/01/14 0450 11/01/14 1310 11/01/14 2205 11/02/14 0501  BP: 142/75 139/73 169/70 125/64  Pulse: 79 86 99 90  Temp: 99.4 F (37.4 C) 98.9 F (37.2 C) 98.9  F (37.2 C) 99.1 F (37.3 C)  TempSrc: Oral Oral Oral Oral  Resp: 18 16 16 16   Height:      Weight:      SpO2: 98% 99% 99% 99%    Intake/Output Summary (Last 24 hours) at 11/02/14 1147 Last data filed at 11/02/14 1028  Gross per 24 hour  Intake 2702.5 ml    Output   2850 ml  Net -147.5 ml    Exam: Gen:  NAD Cardiovascular:  RRR, II/VI SEM Respiratory:  Lungs CTAB Gastrointestinal:  Abdomen soft, +BS, ostomy draining, post op changes dressing intact, no visible drainage Extremities:  Bilateral amputee   Data Reviewed:    Labs: Basic Metabolic Panel:  Recent Labs Lab 10/27/14 0428 10/28/14 0938 10/30/14 0545 10/31/14 0528 11/01/14 0617  NA 133* 136 135 137 137  K 3.7 4.8 4.1 4.1 4.3  CL 98* 106 109 109 108  CO2 24 22 23 23 23   GLUCOSE 127* 89 151* 112* 139*  BUN 20 20 12 9 10   CREATININE 1.04 0.92 0.62 0.54* 0.61  CALCIUM 9.1 8.0* 7.8* 7.7* 8.0*  MG  --   --  2.0 1.9  --   PHOS  --   --  1.3* 3.0 2.7   GFR Estimated Creatinine Clearance: 61.7 mL/min (by C-G formula based on Cr of 0.61). Liver Function Tests:  Recent Labs Lab 10/30/14 0545  AST 64*  ALT 34  ALKPHOS 104  BILITOT 0.4  PROT 5.9*  ALBUMIN 2.0*   CBC:  Recent Labs Lab 10/27/14 0428 10/28/14 0938 10/30/14 0545 10/31/14 0528 11/01/14 0617  WBC 7.1 10.6* 12.4* 14.3* 14.8*  NEUTROABS  --   --  9.8*  --   --   HGB 9.5* 7.7* 6.9* 9.2* 9.2*  HCT 29.8* 24.7* 21.0* 28.3* 28.9*  MCV 78.2 77.7* 77.8* 79.7 80.5  PLT 383 280 275 243 238   CBG:  Recent Labs Lab 11/01/14 0629 11/01/14 1131 11/01/14 1735 11/02/14 0032 11/02/14 0644  GLUCAP 135* 123* 118* 119* 123*   Microbiology No results found for this or any previous visit (from the past 240 hour(s)).   Medications:   . antiseptic oral rinse  7 mL Mouth Rinse q12n4p  . atorvastatin  40 mg Oral QPM  . carvedilol  6.25 mg Oral BID WC  . chlorhexidine  15 mL Mouth Rinse BID  . dicyclomine  20 mg Oral QHS  . escitalopram  5 mg Oral Daily  . folic acid  1 mg Oral Daily  . insulin aspart  0-9 Units Subcutaneous Q6H  . mirtazapine  30 mg Oral QHS  . sodium chloride  10-40 mL Intracatheter Q12H  . thiamine  100 mg Oral Daily   Continuous Infusions: . Marland KitchenTPN (CLINIMIX-E) Adult 50 mL/hr  at 11/01/14 1702   And  . fat emulsion 240 mL (11/01/14 1702)  . 0.9 % NaCl with KCl 20 mEq / L 40 mL/hr at 11/01/14 1756    Time spent: 25 minutes.   LOS: 11 days   Rodney [name]  [facility] Pager [phone].  *Please refer to [email or web address], password [email or web address] to get updated schedule on who will round on this patient, as hospitalists switch teams weekly. If 7PM-7AM, please contact night-coverage at [email or web address], password [email or web address] for any overnight needs.  11/02/2014, 11:47 AM

## 2014-11-02 NOTE — Progress Notes (Signed)
PARENTERAL NUTRITION CONSULT NOTE - FOLLOW UP  Pharmacy Consult for TPN Indication: prolonged ileus  Medications:  Infusions:  . Marland KitchenTPN (CLINIMIX-E) Adult 50 mL/hr at 11/01/14 1702   And  . fat emulsion 240 mL (11/01/14 1702)  . 0.9 % NaCl with KCl 20 mEq / L 40 mL/hr at 11/01/14 1756   Insulin Requirements:  Sensitive SSI - 2 units/24h  Current Nutrition:  Diet: advanced to Regular on 7/31 8/1 Ate breakfast.  Pt reports not much appetite, but no issues tolerating cereal and liquids. TPN @ 50 ml/hr, Lipids @ 10 ml/hr  IVF: NS + KCl 20 mEq/L @ 40 ml/hr  Central access: PICC placed 7/28 TPN start date: 7/28  ASSESSMENT                                                                                                          HPI: 69 yo male, bilateral AK amputee, admitted 10/22/14 with abdominal pain. CT of the abdomen and pelvis showed colonic mass revealed by pathology as adenocarcinoma. On 10/27/14, he underwent a palliative right hemicolectomy, end ileostomy, mucous fistula and cholecystectomy. Pharmacy is consulted 7/28 to begin TPN.  Significant events:  7/30: minimal improvement in intake; no BS. Surgery not advancing diet, although ostomy output improving 7/31: diet advanced to regular, per MD, d/c TPN  Today: Lab collection refused 8/1  Glucose: at goal, < 150  Electrolytes: WNL (7/31)  Renal: SCr 0.6  LFTs: AST/ALT 64/34  TGs:  158 (7/29)  Prealbumin:  4.1 (7/29)  NUTRITIONAL GOALS                                                                                             RD recs (7/28): 1100-1300 kcal, 55-65 g protein per day, fluid 1.7 L/day  Clinimix 5/15 at a goal rate of 68ml/hr + 20% fat emulsion at 80ml/hr to provide: 60g/day protein, 1332Kcal/day.  PLAN                                                                                                                          Decrease TPN to half rate, then d/c.  D/c standing TPN labs.  Gretta Arab PharmD, BCPS Pager [phone] 11/02/2014 7:10 AM

## 2014-11-02 NOTE — Progress Notes (Signed)
Nutrition Follow-up  DOCUMENTATION CODES:   Non-severe (moderate) malnutrition in context of chronic illness  INTERVENTION:   TPN per Pharmacy Continue Ensure Enlive po BID, each supplement provides 350 kcal and 20 grams of protein Encourage PO intake RD to continue to monitor  NUTRITION DIAGNOSIS:   Inadequate oral intake related to inability to eat as evidenced by NPO status.  Improving.  GOAL:   Patient will meet greater than or equal to 90% of their needs  Progressing.  MONITOR:   PO intake, Supplement acceptance, Labs, Weight trends, Skin, I & O's, Other (Comment) (TPN weaning)  REASON FOR ASSESSMENT:   Malnutrition Screening Tool    ASSESSMENT:   69 y.o. male with multiple medical problems presents with abdominal pain. Patient states that the pain started about 3 days ago and is mainly located in the right side lower quadrant. He states that he has had a poor appetite also. He states he has been losing weight. He has had some diarrhea. He admits to nausea and also some vomting. He states he has no blood in his stools. Patient has prior history of vascular disease and has had bilateral amputations. He states that he does smoke. He states that he has a history of ETOH use in the past no drinks in the past 2-3 years. In the ED he had a CT of the abdomen done and this shows presence of a colonic mass likely malignant.  Pt's diet has been advanced to regular diet. PO intake this AM: 25%. RD to order supplement again.  Plan per Pharmacy:  Decrease TPN to half rate, then d/c.  D/c standing TPN labs.  Labs reviewed: CBGs: 118-123 Mg/Phos/K WNL  Diet Order:  Diet regular Room service appropriate?: Yes; Fluid consistency:: Thin .TPN (CLINIMIX-E) Adult  Skin:  Reviewed, no issues  Last BM:  7/31 -colostomy output: 268ml  Height:   Ht Readings from Last 1 Encounters:  10/29/14 5\' 11"  (1.803 m)    Weight:   Wt Readings from Last 1 Encounters:  10/23/14 112  lb 1.6 oz (50.848 kg)    Ideal Body Weight:  63.6 kg  BMI:  Body mass index is 15.64 kg/(m^2).  Estimated Nutritional Needs:   Kcal:  1100-1300  Protein:  55-65 grams  Fluid:  1.7 L/day  EDUCATION NEEDS:   No education needs identified at this time  Clayton Bibles, MS, RD, LDN Pager: [phone] After Hours Pager: [phone]

## 2014-11-02 NOTE — Progress Notes (Signed)
Physical Therapy Treatment Patient Details Name: Rodney [name] MRN: [id number] DOB: 03/11/1946 Today's Date: 11/02/2014    History of Present Illness 69 y.o. male with a PMH of hypertension, alcohol abuse, PVD, bil AKA, CHF who was admitted 10/22/14 with chief complaint of abdominal pain. Workup in the ED included a CT of the abdomen and pelvis which showed a distal transverse colonic mass with high-grade stenosis concerning for malignancy and pt underwent a laparoscopy with right hemicolectomy, end ileostomy, mucous fistula and cholecystectomy on 10/27/14     PT Comments    Pt progressing slowly with mobility due to decrease use of ABD muscles and pain.  Assisted pt OOB to wc.  Pt declined to sit up in chair even for a little bit due to pain and stress on his ABD muscles to do so.  Assisted back to bed.  This short session exerted him.  Pt would benefit from ST Rehab to regain his prior level of mobility and allow his ABD area to heal.  With B AKA's much strength is needed in upper body and core.   Follow Up Recommendations  SNF     Equipment Recommendations       Recommendations for Other Services       Precautions / Restrictions Precautions Precautions: Fall Precaution Comments: bil AKA, ileostomy Restrictions Weight Bearing Restrictions: No    Mobility  Bed Mobility Overal bed mobility: Needs Assistance Bed Mobility: Supine to Sit;Sit to Supine     Supine to sit: Min assist Sit to supine: Min assist   General bed mobility comments: assist for trunk upright, assist for controlling descent.  Utilizes the rails.  Transfers Overall transfer level: Needs assistance Equipment used: None Transfers: Lateral/Scoot Transfers           General transfer comment: required assist with set up to park wheelchair right up against HOB and remove L arm rest.  Pt was able to partially scoot self from bed to wc but required 25% assist to complete from high to lower level surface.  then  required 50% assist to scoot back to high surface bed.    Ambulation/Gait                 Stairs            Wheelchair Mobility    Modified Rankin (Stroke Patients Only)       Balance                                    Cognition Arousal/Alertness: Awake/alert Behavior During Therapy: WFL for tasks assessed/performed Overall Cognitive Status: Within Functional Limits for tasks assessed                 General Comments: appears at prior level    Exercises      General Comments        Pertinent Vitals/Pain Pain Assessment: Faces Faces Pain Scale: Hurts even more Pain Location: ABD during activity Pain Descriptors / Indicators: Constant;Sore;Tender Pain Intervention(s): Monitored during session;Repositioned    Home Living                      Prior Function            PT Goals (current goals can now be found in the care plan section) Progress towards PT goals: Progressing toward goals    Frequency  Min 3X/week  PT Plan      Co-evaluation             End of Session   Activity Tolerance: Patient limited by fatigue;Patient limited by pain Patient left: in bed;with call bell/phone within reach;with bed alarm set     Time: 1522-1540 PT Time Calculation (min) (ACUTE ONLY): 18 min  Charges:  $Therapeutic Activity: 8-22 mins                    G Codes:      Rica Koyanagi  PTA [facility]  Acute  Rehab Pager      [phone]

## 2014-11-03 MED ORDER — LOPERAMIDE HCL 2 MG PO CAPS
2.0000 mg | ORAL_CAPSULE | Freq: Two times a day (BID) | ORAL | Status: DC
  Administered 2014-11-03 – 2014-11-10 (×15): 2 mg via ORAL
  Filled 2014-11-03 (×21): qty 1

## 2014-11-03 MED ORDER — MEGESTROL ACETATE 40 MG PO TABS
40.0000 mg | ORAL_TABLET | Freq: Every day | ORAL | Status: DC
  Administered 2014-11-03 – 2014-11-08 (×6): 40 mg via ORAL
  Filled 2014-11-03 (×6): qty 1

## 2014-11-03 NOTE — Progress Notes (Signed)
CSW continuing to follow.   CSW received notification from MD that pt will likely be medically ready for discharge tomorrow.  CSW followed up with pt at bedside along with RN. CSW discussed with pt that CSW would need decision for SNF today in order to ensure bed availability for tomorrow. Pt stated that he wanted to go somewhere different than [facility] as pt had been to facility in the past. CSW discussed with pt that only other options were [facility] and [facility]. Pt states that he is agreeable to [facility] and Rehab.   CSW contacted [facility] and Rehab and confirmed that facility could accept pt tomorrow if pt medically ready for discharge tomorrow. CSW notified [facility] that pt wishes to complete his own admission paperwork and [facility] stated that pt can complete paperwork upon arrival. [place] request pt be sent to facility with additional colostomy supplies in order for [place] to order colostomy supplies. CSW notified RN.   CSW notified pt at bedside to [facility] and Rehab confirmed bed availability. Pt in agreement to plan.  CSW to continue to follow to assist with pt discharge to [facility] and Rehab when pt medically ready for discharge.  Alison Murray, MSW, [place] [facility] [phone]

## 2014-11-03 NOTE — Progress Notes (Signed)
Patient ID: Rodney [name], male   DOB: 07-22-1945, 69 y.o.   MRN: [id number] 7 Days Post-Op  Subjective: Pt feels ok this morning.  Tolerating solid diet.  Having about 2L out of ostomy yesterday.  Pain is controlled  Objective: Vital signs in last 24 hours: Temp:  [99 F (37.2 C)-99.7 F (37.6 C)] 99 F (37.2 C) (08/02 0520) Pulse Rate:  [90-97] 93 (08/02 0520) Resp:  [16-20] 20 (08/02 0520) BP: (110-119)/(51-64) 112/57 mmHg (08/02 0520) SpO2:  [96 %-100 %] 100 % (08/02 0520) Last BM Date: 11/02/14  Intake/Output from previous day: 08/01 0701 - 08/02 0700 In: 934 [P.O.:240; I.V.:294; TPN:400] Out: 2875 [Urine:950; Stool:1925] Intake/Output this shift:    PE: Abd: soft, +BS, ND, ileostomy with liquid bilious output as expected, midline wound is packed.  Base is necrotic, but edges are clean.  Lab Results:   Recent Labs  11/01/14 0617  WBC 14.8*  HGB 9.2*  HCT 28.9*  PLT 238   BMET  Recent Labs  11/01/14 0617  NA 137  K 4.3  CL 108  CO2 23  GLUCOSE 139*  BUN 10  CREATININE 0.61  CALCIUM 8.0*   PT/INR No results for input(s): LABPROT, INR in the last 72 hours. CMP     Component Value Date/Time   NA 137 11/01/2014 0617   K 4.3 11/01/2014 0617   CL 108 11/01/2014 0617   CO2 23 11/01/2014 0617   GLUCOSE 139* 11/01/2014 0617   BUN 10 11/01/2014 0617   CREATININE 0.61 11/01/2014 0617   CALCIUM 8.0* 11/01/2014 0617   PROT 5.9* 10/30/2014 0545   ALBUMIN 2.0* 10/30/2014 0545   AST 64* 10/30/2014 0545   ALT 34 10/30/2014 0545   ALKPHOS 104 10/30/2014 0545   BILITOT 0.4 10/30/2014 0545   GFRNONAA >60 11/01/2014 0617   GFRAA >60 11/01/2014 0617   Lipase     Component Value Date/Time   LIPASE 30 10/22/2014 1707       Studies/Results: No results found.  Anti-infectives: Anti-infectives    Start     Dose/Rate Route Frequency Ordered Stop   10/27/14 1630  metroNIDAZOLE (FLAGYL) IVPB 500 mg     500 mg 100 mL/hr over 60 Minutes Intravenous 3 times  daily 10/27/14 1559 10/27/14 1743   10/27/14 0915  [MAR Hold]  metroNIDAZOLE (FLAGYL) IVPB 500 mg     (MAR Hold since 10/27/14 1122)   500 mg 100 mL/hr over 60 Minutes Intravenous 30 min pre-op 10/27/14 0837 10/27/14 1328   10/23/14 2200  cefTRIAXone (ROCEPHIN) 1 g in dextrose 5 % 50 mL IVPB  Status:  Discontinued     1 g 100 mL/hr over 30 Minutes Intravenous Every 24 hours 10/22/14 2121 10/28/14 0816   10/22/14 2030  cefTRIAXone (ROCEPHIN) 1 g in dextrose 5 % 50 mL IVPB     1 g 100 mL/hr over 30 Minutes Intravenous  Once 10/22/14 2017 10/22/14 2145       Assessment/Plan Obstructing colon cancer with peritoneal carcinomatosis Diagnostic laparoscopy, right hemicolectomy, end ileostomy, mucous fistula, cholecystectomy, 10/27/14, Dr. Barry Dienes POD 7 -ileus resolved.  Tolerating solid diet -add 2mg  of imodium scheduled BID, may need to go up on this if this does not slow his output a little.  He had 2L out yesterday.  If this doesn't slow down some, he will likely get dehydrated without fluids after DC -TNA DC -cont BID dressing changes -will DC lap staples soon -anticipate SNF dc soon  PVOD/bilateral AKA Hypertension Hx of  tobacco ongoing use ETOH use Hx of CHF DVT: None anemia/no heparin, bilateral LE Amputations/no SCD    LOS: 12 days    Rodney [name] E 11/03/2014, 8:15 AM Pager: [phone]

## 2014-11-03 NOTE — Progress Notes (Signed)
Progress Note   Rodney [name] JGG:[id number] DOB: Apr 09, 1945 DOA: 10/22/2014 PCP: [name], NP   Brief Narrative:   Rodney [name] is an 69 y.o. male with a PMH of hypertension, alcohol abuse, PVD and CHF who was admitted 10/22/14 with chief complaint of abdominal pain. Workup in the ED included a CT of the abdomen and pelvis which showed a distal transverse colonic mass with high-grade stenosis concerning for malignancy. He initially refused surgical intervention and was felt to have capacity after a psychiatric evaluation. He ultimately consented to surgery and underwent a laparoscopy with right hemicolectomy, end ileostomy, mucous fistula and cholecystectomy on 10/27/14 for palliation. The patient is currently on TNA slow recovery of bowel function. Plan is to discharge him to a SNF when cleared by surgery.  Assessment/Plan:   Principal Problem:   Colorectal adenocarcinoma with widespread metastasis - Initial CT showed a transverse colonic mass with liver, spleen, and intra-abdominal metastasis. - S/P palliative laparoscopy with right hemicolectomy, end ileostomy, mucous fistula and cholecystectomy on 10/27/14. - Pathology consistent with colorectal adenocarcinoma. [name] need outpatient oncology follow-up once healed from surgery, although likely a poor candidate for treatment. - Abdominal wound open, looks "soupy", monitor for infection (not on empiric antibiotics), currently afebrile. Monitor WBC. -off PPN, tolerating diet/ostomy management per sugery  Active Problems:   Abdominal aortic aneurysm - Noted incidentally on CT scan. 2.6 cm.    HTN (hypertension) - Continue Coreg. Blood pressure is controlled.    CAD (coronary artery disease), multivessel - Continue statin. Continue Coreg. - Resume aspirin when okay with surgery.    Chronic systolic CHF (congestive heart failure), NYHA class 4 - Status post 2-D echo on 10/25/14: EF 50%. Hypokinesis of inferolateral wall and  basal inferior segment noted. LVH. - LV function markedly improved over prior echocardiogram. Improvement attributed to abstaining from alcohol.    Chronic microcytic anemia / acute blood loss anemia - Likely from acute postoperative blood loss in the setting of chronic GI blood loss.  - Hemoglobin remained stable at 9.2 after receiving 1 unit of PRBCs 10/30/14.    Hypokalemia - Repleted.    Malnutrition of moderate degree/hypophosphatemia - Evaluated by dietitian 10/23/14. Continue supplements. - off PPN, tolertingdiet - Monitor for refeeding syndrome. Phosphorus WNL .    Escherichia coli urinary tract infection - Completed a course of therapy with Rocephin 7 days.    Peripheral vascular disease - Status post bilateral AKAs.  FTT: overall poor prognosis due to metastatic colorectal cancer, patient's has poor insight , patient does not seem to understand cancer diagnosis, psych has seen patient determined patient has capacity to make decision.     DVT Prophylaxis - Lovenox when okay with surgery. Unable to do SCDs given bilateral AKAs.  Family Communication: No family at the bedside. Declines my offer to call. Disposition Plan: SNf, likely 8/3 if ok with surgery Code Status:     Code Status Orders        Start     Ordered   10/22/14 2150  Full code   Continuous     10/22/14 2149        IV Access:    Peripheral IV   Procedures and diagnostic studies:   Ct Abdomen Pelvis W Contrast  10/22/2014   CLINICAL DATA:  69 year old male with right lower quadrant and periumbilical pain. Emesis  EXAM: CT ABDOMEN AND PELVIS WITH CONTRAST  TECHNIQUE: Multidetector CT imaging of the abdomen and pelvis was performed using the standard  protocol following bolus administration of intravenous contrast.  CONTRAST:  54mL OMNIPAQUE IOHEXOL 300 MG/ML SOLN, 165mL OMNIPAQUE IOHEXOL 300 MG/ML SOLN  COMPARISON:  CT dated 08/21/2013  FINDINGS: Partially visualized small right pleural profusion.  Top-normal cardiac size. Right cardiophrenic angle top-normal lymph nodes. No intra-abdominal free air. Small ascites.  There are innumerable hepatic hypodense lesions, increased from prior study. Some lesion such as the 3.8 x 4.5 cm hypodense lesion in the right lobe the liver adjacent the right hepatic vein likely represents a hemangioma and others were previously characterized as cysts. Other lesions such as a 2.4 x 3.4 cm subcapsular lesion seen in the right hepatic lobe posteriorly (series 2, image 19) are not well characterized but suspicious for metastatic disease. There are nodular implants predominantly involving the surface of the right lobe of the liver with scalloping of the liver parenchyma compatible with subcapsular implants.  The gallbladder is not visualized, likely surgically absent. The pancreas is unremarkable. Surgical clips noted adjacent the head of the pancreas. Multiple hypodense lesions noted predominantly involving the surface of the spleen concerning for metastatic implant. Left adrenal thickening/hyperplasia. There are bilateral renal cortical irregularity is. There is apparent diffuse thickening of the bladder wall which may be partly related to underdistention. Cystitis is not excluded. Correlation with urinalysis recommended. The prostate and seminal vesicles are grossly unremarkable.  There is an ill-defined irregular nodular mass measuring approximately 3.5 x 2.3 cm involving the distal transverse colon most compatible with malignancy. There is high-grade stenosis of the bowel with dilatation of the colon proximal to this mass. There is no evidence of small bowel obstruction. Scattered sigmoid diverticula without active inflammation.  There is aortoiliac atherosclerotic disease. A 2.6 cm infrarenal abdominal aortic ectasia. 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  There are nodular implants in the left lower abdomen with a combined dimension 1.9 x 4.7 cm. There is a 3.7 x 2.0 cm soft tissue implant is noted in the right lower quadrant adjacent the appendix. Multiple nodular implants are also noted adjacent to the tip of the appendix. Two 2 adjacent implants and free fluid extending from the subhepatic area. However, No definite evidence of acute appendicitis noted.  Multiple nodular densities anterior and inferior to the spleen compatible with metastatic implants.  Retroperitoneal/right periaortic adenopathy measures 1.3 cm in short axis.  Right anterior upper thigh lipoma noted with there is degenerative changes of the spine. No acute fracture.  IMPRESSION: Distal transverse colon obstructing mass most compatible with malignancy. Further evaluation with colonoscopy and tissue sampling recommended. There is high-grade stenosis and dilatation of the colon proximal to this mass.  Peritoneal, mesenteric, and hepatic metastatic disease.  No definite CT evidence of acute appendicitis.   Electronically Signed   By: Anner Crete M.D.   On: 10/22/2014 19:59     Medical Consultants:    Psychiatry  General surgery  GI  Cardiology  Anti-Infectives:    Ceftriaxone 10/22/2014> 10/28/14  Subjective:   Rodney [name] denies current pain, dyspnea.  Reported poor appetite, poor insight about cancer diagnosis.  Objective:    Filed Vitals:   11/02/14 0501 11/02/14 1318 11/02/14 2154 11/03/14 0520  BP: 125/64 119/64 110/51 112/57  Pulse: 90 97 90 93  Temp: 99.1 F (  37.3 C) 99.4 F (37.4 C) 99.7 F (37.6 C) 99 F (37.2 C)  TempSrc: Oral Oral Oral Oral  Resp: 16 16 16 20   Height:      Weight:      SpO2: 99% 98% 96% 100%    Intake/Output Summary (Last 24 hours) at 11/03/14 1234 Last data filed at 11/03/14 1028   Gross per 24 hour  Intake    934 ml  Output   2325 ml  Net  -1391 ml    Exam: Gen:  NAD, flat affect, replies " whatever" to most of the questions. Cardiovascular:  RRR, II/VI SEM Respiratory:  Lungs CTAB Gastrointestinal:  Abdomen soft, +BS, ostomy draining, post op changes dressing intact, no visible drainage Extremities:  Bilateral amputee   Data Reviewed:    Labs: Basic Metabolic Panel:  Recent Labs Lab 10/28/14 0938 10/30/14 0545 10/31/14 0528 11/01/14 0617  NA 136 135 137 137  K 4.8 4.1 4.1 4.3  CL 106 109 109 108  CO2 22 23 23 23   GLUCOSE 89 151* 112* 139*  BUN 20 12 9 10   CREATININE 0.92 0.62 0.54* 0.61  CALCIUM 8.0* 7.8* 7.7* 8.0*  MG  --  2.0 1.9  --   PHOS  --  1.3* 3.0 2.7   GFR Estimated Creatinine Clearance: 61.7 mL/min (by C-G formula based on Cr of 0.61). Liver Function Tests:  Recent Labs Lab 10/30/14 0545  AST 64*  ALT 34  ALKPHOS 104  BILITOT 0.4  PROT 5.9*  ALBUMIN 2.0*   CBC:  Recent Labs Lab 10/28/14 0938 10/30/14 0545 10/31/14 0528 11/01/14 0617  WBC 10.6* 12.4* 14.3* 14.8*  NEUTROABS  --  9.8*  --   --   HGB 7.7* 6.9* 9.2* 9.2*  HCT 24.7* 21.0* 28.3* 28.9*  MCV 77.7* 77.8* 79.7 80.5  PLT 280 275 243 238   CBG:  Recent Labs Lab 11/01/14 1131 11/01/14 1735 11/02/14 0032 11/02/14 0644 11/02/14 1316  GLUCAP 123* 118* 119* 123* 96   Microbiology No results found for this or any previous visit (from the past 240 hour(s)).   Medications:   . antiseptic oral rinse  7 mL Mouth Rinse q12n4p  . atorvastatin  40 mg Oral QPM  . carvedilol  6.25 mg Oral BID WC  . chlorhexidine  15 mL Mouth Rinse BID  . dicyclomine  20 mg Oral QHS  . escitalopram  5 mg Oral Daily  . feeding supplement (ENSURE ENLIVE)  237 mL Oral BID BM  . folic acid  1 mg Oral Daily  . loperamide  2 mg Oral BID  . megestrol  40 mg Oral Daily  . mirtazapine  30 mg Oral QHS  . sodium chloride  10-40 mL Intracatheter Q12H  . thiamine  100 mg Oral  Daily   Continuous Infusions: . 0.9 % NaCl with KCl 20 mEq / L 40 mL/hr at 11/02/14 1308    Time spent: 25 minutes.   LOS: 12 days   Keyshawna Prouse  [facility] Pager [phone].  *Please refer to [email or web address], password [email or web address] to get updated schedule on who will round on this patient, as hospitalists switch teams weekly. If 7PM-7AM, please contact night-coverage at [email or web address], password [email or web address] for any overnight needs.  11/03/2014, 12:34 PM

## 2014-11-03 NOTE — Clinical Social Work Placement (Signed)
   CLINICAL SOCIAL WORK PLACEMENT  NOTE  Date:  11/03/2014  Patient Details  Name: Wilmont Olund MRN: [id number] Date of Birth: June 10, 1945  Clinical Social Work is seeking post-discharge placement for this patient at the [place] level of care (*CSW will initial, date and re-position this form in  chart as items are completed):  Yes   Patient/family provided with [place] Work Department's list of facilities offering this level of care within the geographic area requested by the patient (or if unable, by the patient's family).  Yes   Patient/family informed of their freedom to choose among providers that offer the needed level of care, that participate in Medicare, Medicaid or managed care program needed by the patient, have an available bed and are willing to accept the patient.  Yes   Patient/family informed of [facility]'s ownership interest in [facility] and [facility], as well as of the fact that they are under no obligation to receive care at these facilities.  PASRR submitted to EDS on 10/28/14     PASRR number received on 10/28/14     Existing PASRR number confirmed on       FL2 transmitted to all facilities in geographic area requested by pt/family on 10/28/14     FL2 transmitted to all facilities within larger geographic area on       Patient informed that his/her managed care company has contracts with or will negotiate with certain facilities, including the following:        Yes   Patient/family informed of bed offers received.  Patient chooses bed at [facility]     Physician recommends and patient chooses bed at      Patient to be transferred to   on  .  Patient to be transferred to facility by       Patient family notified on   of transfer.  Name of family member notified:        PHYSICIAN Please sign FL2     Additional Comment:    _______________________________________________ Ladell Pier, LCSW 11/03/2014, 4:22 PM

## 2014-11-04 DIAGNOSIS — I70209 Unspecified atherosclerosis of native arteries of extremities, unspecified extremity: Secondary | ICD-10-CM

## 2014-11-04 DIAGNOSIS — K56609 Unspecified intestinal obstruction, unspecified as to partial versus complete obstruction: Secondary | ICD-10-CM | POA: Insufficient documentation

## 2014-11-04 LAB — CBC
HEMATOCRIT: 26.9 % — AB (ref 39.0–52.0)
Hemoglobin: 8.7 g/dL — ABNORMAL LOW (ref 13.0–17.0)
MCH: 25.7 pg — ABNORMAL LOW (ref 26.0–34.0)
MCHC: 32.3 g/dL (ref 30.0–36.0)
MCV: 79.6 fL (ref 78.0–100.0)
Platelets: 311 10*3/uL (ref 150–400)
RBC: 3.38 MIL/uL — AB (ref 4.22–5.81)
RDW: 18.9 % — ABNORMAL HIGH (ref 11.5–15.5)
WBC: 13.1 10*3/uL — AB (ref 4.0–10.5)

## 2014-11-04 LAB — COMPREHENSIVE METABOLIC PANEL
ALK PHOS: 311 U/L — AB (ref 38–126)
ALT: 159 U/L — AB (ref 17–63)
ANION GAP: 5 (ref 5–15)
AST: 164 U/L — AB (ref 15–41)
Albumin: 2.2 g/dL — ABNORMAL LOW (ref 3.5–5.0)
BUN: 11 mg/dL (ref 6–20)
CALCIUM: 7.3 mg/dL — AB (ref 8.9–10.3)
CO2: 23 mmol/L (ref 22–32)
CREATININE: 0.58 mg/dL — AB (ref 0.61–1.24)
Chloride: 110 mmol/L (ref 101–111)
GFR calc Af Amer: >60 mL/min (ref >60)
Glucose, Bld: 95 mg/dL (ref 65–99)
Potassium: 4 mmol/L (ref 3.5–5.1)
Sodium: 138 mmol/L (ref 135–145)
TOTAL PROTEIN: 6.2 g/dL — AB (ref 6.5–8.1)
Total Bilirubin: 1.1 mg/dL (ref 0.3–1.2)

## 2014-11-04 LAB — GLUCOSE, CAPILLARY: GLUCOSE-CAPILLARY: 120 mg/dL — AB (ref 65–99)

## 2014-11-04 MED ORDER — BOOST / RESOURCE BREEZE PO LIQD
1.0000 | Freq: Three times a day (TID) | ORAL | Status: DC
  Administered 2014-11-04 – 2014-11-09 (×7): 1 via ORAL

## 2014-11-04 MED ORDER — MEGESTROL ACETATE 40 MG PO TABS
40.0000 mg | ORAL_TABLET | Freq: Every day | ORAL | Status: DC
Start: 2014-11-04 — End: 2014-11-10

## 2014-11-04 MED ORDER — ENSURE ENLIVE PO LIQD: 237.0000 mL | Freq: Two times a day (BID) | ORAL | Status: DC

## 2014-11-04 MED ORDER — SODIUM CHLORIDE 0.9 % IV BOLUS (SEPSIS)
1000.0000 mL | Freq: Once | INTRAVENOUS | Status: AC
  Administered 2014-11-04: 1000 mL via INTRAVENOUS

## 2014-11-04 NOTE — Progress Notes (Signed)
Pt refused morning labs "states he will get it drawn later"

## 2014-11-04 NOTE — Progress Notes (Signed)
CSW continuing to follow for disposition planning.   Pt plans for [facility] and Rehab when medically ready for discharge.  CSW received update from RN that pt not yet medically ready for discharge today per surgery.   CSW updated [facility].  CSW to continue to follow to provide support and assist with pt discharge planning needs to [facility] when pt medically ready for discharge.  Alison Murray, MSW, [place] [facility] [phone]

## 2014-11-04 NOTE — Progress Notes (Signed)
Progress Note   Rodney [name] [id number]:[id number] DOB: October 27, 1945 DOA: 10/22/2014 PCP: Rodney Grill, NP   Brief Narrative:   Rodney [name] is an 69 y.o. male with a PMH of hypertension, alcohol abuse, PVD and CHF who was admitted 10/22/14 with chief complaint of abdominal pain. Workup in the ED included a CT of the abdomen and pelvis which showed a distal transverse colonic mass with high-grade stenosis concerning for malignancy. He initially refused surgical intervention and was felt to have capacity after a psychiatric evaluation. He ultimately consented to surgery and underwent a laparoscopy with right hemicolectomy, end ileostomy, mucous fistula and cholecystectomy on 10/27/14 for palliation. The patient is currently on TNA slow recovery of bowel function. Plan is to discharge him to a SNF when cleared by surgery.  Assessment/Plan:   Principal Problem:   Colorectal adenocarcinoma with widespread metastasis - Initial CT showed a transverse colonic mass with liver, spleen, and intra-abdominal metastasis. - S/P palliative laparoscopy with right hemicolectomy, end ileostomy, mucous fistula and cholecystectomy on 10/27/14. - Pathology consistent with colorectal adenocarcinoma. [name] need outpatient oncology follow-up once healed from surgery, although likely a poor candidate for treatment. - Abdominal wound open, looks "soupy", monitor for infection (not on empiric antibiotics), currently afebrile. Monitor WBC. -off PPN, tolerating diet/wound/ostomy management per sugery  Active Problems:   Abdominal aortic aneurysm - Noted incidentally on CT scan. 2.6 cm.    HTN (hypertension) - Continue Coreg. Blood pressure is controlled.    CAD (coronary artery disease), multivessel - Continue statin. Continue Coreg. - Resume aspirin when okay with surgery.    Chronic systolic CHF (congestive heart failure), NYHA class 4 - Status post 2-D echo on 10/25/14: EF 50%. Hypokinesis of inferolateral wall  and basal inferior segment noted. LVH. - LV function markedly improved over prior echocardiogram. Improvement attributed to abstaining from alcohol.    Chronic microcytic anemia / acute blood loss anemia - Likely from acute postoperative blood loss in the setting of chronic GI blood loss.  - Hemoglobin remained stable at 9.2 after receiving 1 unit of PRBCs 10/30/14.    Hypokalemia - Repleted.    Malnutrition of moderate degree/hypophosphatemia - Evaluated by dietitian 10/23/14. Continue supplements. - off PPN, tolerating diet - Monitor for refeeding syndrome. Phosphorus WNL .    Escherichia coli urinary tract infection - Completed a course of therapy with Rocephin 7 days.    Peripheral vascular disease - Status post bilateral AKAs.  FTT: overall poor prognosis due to metastatic colorectal cancer, patient's has poor insight , patient does not seem to understand cancer diagnosis, psych has seen patient determined patient has capacity to make decision.     DVT Prophylaxis - Lovenox when okay with surgery. Unable to do SCDs given bilateral AKAs.  Family Communication: No family at the bedside. Declines my offer to call. Disposition Plan: SNF when ok with surgery Code Status:     Code Status Orders        Start     Ordered   10/22/14 2150  Full code   Continuous     10/22/14 2149        IV Access:    Peripheral IV   Procedures and diagnostic studies:   Ct Abdomen Pelvis W Contrast  10/22/2014   CLINICAL DATA:  69 year old male with right lower quadrant and periumbilical pain. Emesis  EXAM: CT ABDOMEN AND PELVIS WITH CONTRAST  TECHNIQUE: Multidetector CT imaging of the abdomen and pelvis was performed using the standard protocol  following bolus administration of intravenous contrast.  CONTRAST:  24mL OMNIPAQUE IOHEXOL 300 MG/ML SOLN, 140mL OMNIPAQUE IOHEXOL 300 MG/ML SOLN  COMPARISON:  CT dated 08/21/2013  FINDINGS: Partially visualized small right pleural profusion.  Top-normal cardiac size. Right cardiophrenic angle top-normal lymph nodes. No intra-abdominal free air. Small ascites.  There are innumerable hepatic hypodense lesions, increased from prior study. Some lesion such as the 3.8 x 4.5 cm hypodense lesion in the right lobe the liver adjacent the right hepatic vein likely represents a hemangioma and others were previously characterized as cysts. Other lesions such as a 2.4 x 3.4 cm subcapsular lesion seen in the right hepatic lobe posteriorly (series 2, image 19) are not well characterized but suspicious for metastatic disease. There are nodular implants predominantly involving the surface of the right lobe of the liver with scalloping of the liver parenchyma compatible with subcapsular implants.  The gallbladder is not visualized, likely surgically absent. The pancreas is unremarkable. Surgical clips noted adjacent the head of the pancreas. Multiple hypodense lesions noted predominantly involving the surface of the spleen concerning for metastatic implant. Left adrenal thickening/hyperplasia. There are bilateral renal cortical irregularity is. There is apparent diffuse thickening of the bladder wall which may be partly related to underdistention. Cystitis is not excluded. Correlation with urinalysis recommended. The prostate and seminal vesicles are grossly unremarkable.  There is an ill-defined irregular nodular mass measuring approximately 3.5 x 2.3 cm involving the distal transverse colon most compatible with malignancy. There is high-grade stenosis of the bowel with dilatation of the colon proximal to this mass. There is no evidence of small bowel obstruction. Scattered sigmoid diverticula without active inflammation.  There is aortoiliac atherosclerotic disease. A 2.6 cm infrarenal abdominal aortic ectasia. 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  There are nodular implants in the left lower abdomen with a combined dimension 1.9 x 4.7 cm. There is a 3.7 x 2.0 cm soft tissue implant is noted in the right lower quadrant adjacent the appendix. Multiple nodular implants are also noted adjacent to the tip of the appendix. Two 2 adjacent implants and free fluid extending from the subhepatic area. However, No definite evidence of acute appendicitis noted.  Multiple nodular densities anterior and inferior to the spleen compatible with metastatic implants.  Retroperitoneal/right periaortic adenopathy measures 1.3 cm in short axis.  Right anterior upper thigh lipoma noted with there is degenerative changes of the spine. No acute fracture.  IMPRESSION: Distal transverse colon obstructing mass most compatible with malignancy. Further evaluation with colonoscopy and tissue sampling recommended. There is high-grade stenosis and dilatation of the colon proximal to this mass.  Peritoneal, mesenteric, and hepatic metastatic disease.  No definite CT evidence of acute appendicitis.   Electronically Signed   By: Anner Crete M.D.   On: 10/22/2014 19:59     Medical Consultants:    Psychiatry  General surgery  GI  Cardiology  Anti-Infectives:    Ceftriaxone 10/22/2014> 10/28/14  Subjective:   Rodney [name] Reported poor appetite, poor insight about cancer diagnosis. Does not seem in distress  Objective:    Filed Vitals:   11/03/14 0520 11/03/14 1322 11/03/14 2048 11/04/14 0520  BP: 112/57 114/57 109/52 109/61  Pulse: 93 88 91 102  Temp: 99 F (37.2  C) 98.8 F (37.1 C) 99.3 F (37.4 C) 98.7 F (37.1 C)  TempSrc: Oral Oral Oral Oral  Resp: 20 18 16 20   Height:      Weight:      SpO2: 100% 100% 99% 98%    Intake/Output Summary (Last 24 hours) at 11/04/14 1323 Last data filed at 11/04/14 1001   Gross per 24 hour  Intake    120 ml  Output   1975 ml  Net  -1855 ml    Exam: Gen:  NAD, flat affect, replies " whatever" to most of the questions. Cardiovascular:  RRR, II/VI SEM Respiratory:  Lungs CTAB Gastrointestinal:  Abdomen soft, +BS, ostomy draining, post op changes dressing intact, no visible drainage Extremities:  Bilateral amputee   Data Reviewed:    Labs: Basic Metabolic Panel:  Recent Labs Lab 10/30/14 0545 10/31/14 0528 11/01/14 0617 11/04/14 0718  NA 135 137 137 138  K 4.1 4.1 4.3 4.0  CL 109 109 108 110  CO2 23 23 23 23   GLUCOSE 151* 112* 139* 95  BUN 12 9 10 11   CREATININE 0.62 0.54* 0.61 0.58*  CALCIUM 7.8* 7.7* 8.0* 7.3*  MG 2.0 1.9  --   --   PHOS 1.3* 3.0 2.7  --    GFR Estimated Creatinine Clearance: 61.7 mL/min (by C-G formula based on Cr of 0.58). Liver Function Tests:  Recent Labs Lab 10/30/14 0545 11/04/14 0718  AST 64* 164*  ALT 34 159*  ALKPHOS 104 311*  BILITOT 0.4 1.1  PROT 5.9* 6.2*  ALBUMIN 2.0* 2.2*   CBC:  Recent Labs Lab 10/30/14 0545 10/31/14 0528 11/01/14 0617 11/04/14 0718  WBC 12.4* 14.3* 14.8* 13.1*  NEUTROABS 9.8*  --   --   --   HGB 6.9* 9.2* 9.2* 8.7*  HCT 21.0* 28.3* 28.9* 26.9*  MCV 77.8* 79.7 80.5 79.6  PLT 275 243 238 311   CBG:  Recent Labs Lab 11/01/14 1131 11/01/14 1735 11/02/14 0032 11/02/14 0644 11/02/14 1316  GLUCAP 123* 118* 119* 123* 96   Microbiology No results found for this or any previous visit (from the past 240 hour(s)).   Medications:   . antiseptic oral rinse  7 mL Mouth Rinse q12n4p  . atorvastatin  40 mg Oral QPM  . carvedilol  6.25 mg Oral BID WC  . chlorhexidine  15 mL Mouth Rinse BID  . dicyclomine  20 mg Oral QHS  . escitalopram  5 mg Oral Daily  . feeding supplement  1 Container Oral TID BM  . feeding supplement (ENSURE ENLIVE)  237 mL Oral BID BM  . folic acid  1 mg Oral Daily  . loperamide  2 mg Oral BID  . megestrol  40 mg Oral Daily  . mirtazapine   30 mg Oral QHS  . sodium chloride  1,000 mL Intravenous Once  . sodium chloride  10-40 mL Intracatheter Q12H  . thiamine  100 mg Oral Daily   Continuous Infusions: . 0.9 % NaCl with KCl 20 mEq / L 40 mL/hr at 11/03/14 1447    Time spent: 25 minutes.   LOS: 13 days   Judyann Casasola  [facility] Pager [phone].  *Please refer to [email or web address], password [email or web address] to get updated schedule on who will round on this patient, as hospitalists switch teams weekly. If 7PM-7AM, please contact night-coverage at [email or web address], password [email or web address] for any overnight needs.  11/04/2014, 1:23 PM

## 2014-11-04 NOTE — Progress Notes (Signed)
8 Days Post-Op  Subjective: He is taking very little PO, his output is better, but it's all water, his intake is very poor.  His wound changed last PM was purulent, and sutures are now loose at the base now.  Nothing appeal to him for PO.  Objective: Vital signs in last 24 hours: Temp:  [98.7 F (37.1 C)-99.3 F (37.4 C)] 98.7 F (37.1 C) (08/03 0520) Pulse Rate:  [88-102] 102 (08/03 0520) Resp:  [16-20] 20 (08/03 0520) BP: (109-114)/(52-61) 109/61 mmHg (08/03 0520) SpO2:  [98 %-100 %] 98 % (08/03 0520) Last BM Date: 11/03/14 360 PO recorded 1025 from ileostomy, down from 1925 recorded day prior. Afebrile, BP stable, and HR in 90's at rest  He still has an elevated WBC, anemia and rising ALT/AST Intake/Output from previous day: 08/02 0701 - 08/03 0700 In: 360 [P.O.:360] Out: 4332 [Urine:750; Stool:1025] Intake/Output this shift: Total I/O In: -  Out: 425 [Urine:100; Stool:325]  General appearance: alert and no distress GI: soft, open wound is purulent, and sutures are loose, some white necrotic tissue at the base of the upper wound.  Ileostomy is less volume but it is clear fluid.    Lab Results:   Recent Labs  11/04/14 0718  WBC 13.1*  HGB 8.7*  HCT 26.9*  PLT 311    BMET  Recent Labs  11/04/14 0718  NA 138  K 4.0  CL 110  CO2 23  GLUCOSE 95  BUN 11  CREATININE 0.58*  CALCIUM 7.3*   PT/INR No results for input(s): LABPROT, INR in the last 72 hours.   Recent Labs Lab 10/30/14 0545 11/04/14 0718  AST 64* 164*  ALT 34 159*  ALKPHOS 104 311*  BILITOT 0.4 1.1  PROT 5.9* 6.2*  ALBUMIN 2.0* 2.2*     Lipase     Component Value Date/Time   LIPASE 30 10/22/2014 1707     Studies/Results: No results found.  Medications: . antiseptic oral rinse  7 mL Mouth Rinse q12n4p  . atorvastatin  40 mg Oral QPM  . carvedilol  6.25 mg Oral BID WC  . chlorhexidine  15 mL Mouth Rinse BID  . dicyclomine  20 mg Oral QHS  . escitalopram  5 mg Oral Daily  .  feeding supplement (ENSURE ENLIVE)  237 mL Oral BID BM  . folic acid  1 mg Oral Daily  . loperamide  2 mg Oral BID  . megestrol  40 mg Oral Daily  . mirtazapine  30 mg Oral QHS  . sodium chloride  10-40 mL Intracatheter Q12H  . thiamine  100 mg Oral Daily    Assessment/Plan Obstructing colon cancer with peritoneal carcinomatosis Diagnostic laparoscopy, right hemicolectomy, end ileostomy, mucous fistula, cholecystectomy, 10/27/14, Dr. Barry Dienes POD 8 Post op ileus PVOD/bilateral AKA Hypertension Hx of tobacco ongoing use ETOH use Hx of CHF Anemia - being transfused 1 unit of PRBC now. Malnutrition TNA discontinued Antibiotics: 6 days of ceftriaxone, 2 doses of flagyl pre op. All currently discontinued DVT: None anemia/no heparin, bilateral LE Amputations/no SCD   Plan:  I don't think he can go to SNF.  The biggest issue is poor intake and ileostomy output.  At this rate he is going to be dehydrated and back in a very short period.  For what it is worth he is 1900 CC's negative for yesterday.  I will give him some fluids he is on megace. Worried about the wound, also, no improvement since i took staples out Monday.  LOS: 13 days    Nayara Taplin 11/04/2014

## 2014-11-04 NOTE — Discharge Summary (Addendum)
Discharge Summary  Rodney [name] PPI:[id number] DOB: 1946/03/19  PCP: Rodney Grill, NP  Admit date: 10/22/2014 Discharge date: 11/10/2014  Time spent: >99mins  Recommendations for Outpatient Follow-up:  1. F/u with oncology within 2 weeks 2. Consider palliative care consult at SNF 3. F/u with general surgery 4. F/u with pmd in two weeks for hospital discharge follow up  Discharge Diagnoses:  Active Hospital Problems   Diagnosis Date Noted  . Primary colorectal adenocarcinoma 10/30/2014  . Intestinal occlusion   . Abdominal aortic aneurysm 11/01/2014  . Hypophosphatemia 10/30/2014  . Postoperative anemia due to acute blood loss 10/29/2014  . Microcytic anemia 10/28/2014  . Atherosclerotic peripheral vascular disease status post bilateral AKA's 10/28/2014  . Malnutrition of moderate degree 10/23/2014  . Escherichia coli urinary tract infection 10/23/2014  . Colonic mass 10/22/2014  . Chronic systolic CHF (congestive heart failure), NYHA class 4 10/22/2014  . Hypokalemia 10/22/2014  . CAD (coronary artery disease) 02/09/2014  . HTN (hypertension)     Resolved Hospital Problems   Diagnosis Date Noted Date Resolved  . Anemia 10/22/2014 10/28/2014    Discharge Condition: stable  Diet recommendation: heart healthy  Filed Weights   11/07/14 0514 11/08/14 0648 11/10/14 0549  Weight: 50.077 kg (110 lb 6.4 oz) 49.896 kg (110 lb) 47.628 kg (105 lb)    History of present illness:  Rodney [name] is a 69 y.o. male with multiple medical problems presents with abdominal pain. Patient states that the pain started about 3 days ago and is mainly located in the Right side lower quadrant. He staets that he has had a poor appetite also. He states he has been losing weight. He has had some diarrhea. He admits to nausea and also some vomting. He states he has no blood in his stools. Patient has prior history of vascular disease and has had bilateral amputations. He states that he does smoke. He  states that he has a history of ETOH use in the past no drinks in the past 2-3 years. In the ED he had a CT of the abdomen done and this shows presence of a colonic mass likely malignant. He initially refused surgical intervention and was felt to have capacity after a psychiatric evaluation. He ultimately consented to surgery and underwent a laparoscopy with right hemicolectomy, end ileostomy, mucous fistula and cholecystectomy on 10/27/14 for palliation. The patient received alternative nutrition TNA to allow slow recovery of bowel function.  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 - S/P palliative laparoscopy with right hemicolectomy, end ileostomy, mucous fistula and cholecystectomy on 10/27/14.   Pathology consistent with colorectal adenocarcinoma. [name] need outpatient oncology follow-up once healed from surgery, although likely a poor candidate for treatment. Will benefit from palliative consult -off PPN, tolerating diet/wound/ostomy management per surgery -tpn restarted and stopped by surgery -please monitor colostomy output, call surgery if increased output ,  surgery oked to discharge patient to SNF with outpatient surgery follow up.  Active Problems:  Abdominal aortic aneurysm - Noted incidentally on CT scan. 2.6 cm.   HTN  (hypertension) - has been on coreg, bp low normal on 8/5, d/c coreg, close monitor bp -bp stable after off coreg Restarted low dose coreg at discharge, close monitor bp   CAD (coronary artery disease), multivessel - statin discontinued  Continue low dose Coreg. - Resumed aspirin   Chronic systolic CHF (congestive heart failure), NYHA class 4 - Status post 2-D echo on 10/25/14: EF 50%. Hypokinesis of inferolateral wall and basal inferior segment noted. LVH. - LV function markedly improved over prior echocardiogram. Improvement attributed to abstaining from alcohol. -on lasix as need.   Chronic microcytic anemia / acute blood loss anemia - Likely from acute postoperative blood loss in the setting of chronic GI blood loss.  - Hemoglobin remained stable at 9.2 after receiving 1 unit of PRBCs 10/30/14. -hgb stable   Hypokalemia - Repleted.   Malnutrition of moderate degree/hypophosphatemia - Evaluated by dietitian 10/23/14. Continue supplements. - off TPN, advance diet - Monitor for refeeding syndrome. Phosphorus WNL . -increase megace, continue remeron, start pepcid, nutrition supplement, patient is also prescribed with 5days of prednisone for appetite stimulation   Escherichia coli urinary tract infection - Completed a course of therapy with Rocephin 7 days.   Peripheral vascular disease - Status post bilateral AKAs.  FTT: overall poor prognosis due to metastatic colorectal cancer, patient's has poor insight , patient does not seem to understand cancer diagnosis, psych has seen patient determined patient has capacity to make decision.    DVT Prophylaxis - Lovenox when okay with surgery. Unable to do SCDs given bilateral AKAs.  Family Communication: No family at the bedside. Declines my offer to call. Disposition Plan: SNF  Code Status: full, discussed with patient          ABX;  Ceftriaxone 10/22/2014> 10/28/14  Procedures: Diagnostic laparoscopy, right  hemicolectomy, end ileostomy, mucous fistula, cholecystectomy on 7/26 by Dr. Stark Klein  Consultations:  Psychiatry  General surgery  GI  Cardiology  Discharge Exam: BP 109/61 mmHg  Pulse 96  Temp(Src) 97.9 F (36.6 C) (Oral)  Resp 20  Ht 5\' 11"  (1.803 m)  Wt 47.628 kg (105 lb)  BMI 14.65 kg/m2  SpO2 98%  Gen: NAD, flat affect, talking more prior to discharge Cardiovascular: RRR, II/VI SEM Respiratory: Lungs CTAB Gastrointestinal: Abdomen soft, +BS, ostomy draining, post op changes dressing intact, no visible drainage Extremities: Bilateral amputee   Discharge Instructions You were cared for by a hospitalist during your hospital stay. If you have any questions about your discharge medications or the care you received while you were in the hospital after you are discharged, you can call the unit and asked to speak with the hospitalist on call if the hospitalist that took care of you is not available. Once you are discharged, your primary care physician will handle any further medical issues. Please note that NO REFILLS for any discharge medications will be authorized once you are discharged, as it is imperative that you return to your primary care physician (or establish a relationship with a primary care physician if you do not have one) for your aftercare needs so that they can reassess your need for medications and monitor your lab values.      Discharge Instructions    Diet - low sodium heart healthy    Complete by:  As directed  Diet - low sodium heart healthy    Complete by:  As directed      Increase activity slowly    Complete by:  As directed      Increase activity slowly    Complete by:  As directed             Medication List    STOP taking these medications        acetaminophen 325 MG tablet  Commonly known as:  TYLENOL     atorvastatin 40 MG tablet  Commonly known as:  LIPITOR     doxycycline 100 MG tablet  Commonly known as:   VIBRA-TABS     HYDROcodone-acetaminophen 5-325 MG per tablet  Commonly known as:  NORCO/VICODIN      TAKE these medications        aspirin EC 81 MG tablet  Take 81 mg by mouth every morning.     carvedilol 6.25 MG tablet  Commonly known as:  COREG  Take 0.5 tablets (3.125 mg total) by mouth 2 (two) times daily with a meal.     dicyclomine 20 MG tablet  Commonly known as:  BENTYL  Take 20 mg by mouth at bedtime.     escitalopram 5 MG tablet  Commonly known as:  LEXAPRO  Take 5 mg by mouth daily.     famotidine 20 MG tablet  Commonly known as:  PEPCID  Take 1 tablet (20 mg total) by mouth daily.     feeding supplement (ENSURE ENLIVE) Liqd  Take 237 mLs by mouth 2 (two) times daily between meals.     furosemide 20 MG tablet  Commonly known as:  LASIX  Take 1 tablet (20 mg total) by mouth daily as needed.     loperamide 2 MG capsule  Commonly known as:  IMODIUM  Take 1 capsule (2 mg total) by mouth 2 (two) times daily.     megestrol 400 MG/10ML suspension  Commonly known as:  MEGACE  Take 20 mLs (800 mg total) by mouth daily.     mirtazapine 30 MG tablet  Commonly known as:  REMERON  Take 30 mg by mouth at bedtime.     multivitamin with minerals Tabs tablet  Take 1 tablet by mouth daily.     predniSONE 5 MG tablet  Commonly known as:  DELTASONE  Take 1 tablet (5 mg total) by mouth daily with breakfast.     vitamin C 500 MG tablet  Commonly known as:  ASCORBIC ACID  Take 500 mg by mouth daily.     zinc sulfate 220 MG capsule  Take 220 mg by mouth daily.       No Known Allergies Follow-up Information    Follow up with HATCHETT, MARY, NP In 2 weeks.   Specialty:  Nurse Practitioner   Why:  hospital discharge follow up   Contact information:   [address] [address] [place] [address] [phone]       Follow up with [facility], MD In 2 weeks.   Specialty:  General Surgery   Why:  post op follow up   Contact information:   [address] [place] [address] [phone]       Follow up with [facility] In 2 weeks.   Why:  colorectal cancer       The results of significant diagnostics from this hospitalization (including imaging, microbiology, ancillary and laboratory) are listed below for reference.    Significant  Diagnostic Studies: Ct Abdomen Pelvis W Contrast  10/22/2014   CLINICAL DATA:  69 year old male with right lower quadrant and periumbilical pain. Emesis  EXAM: CT ABDOMEN AND PELVIS WITH CONTRAST  TECHNIQUE: Multidetector CT imaging of the abdomen and pelvis was performed using the standard protocol following bolus administration of intravenous contrast.  CONTRAST:  52mL OMNIPAQUE IOHEXOL 300 MG/ML SOLN, 135mL OMNIPAQUE IOHEXOL 300 MG/ML SOLN  COMPARISON:  CT dated 08/21/2013  FINDINGS: Partially visualized small right pleural profusion. Top-normal cardiac size. Right cardiophrenic angle top-normal lymph nodes. No intra-abdominal free air. Small ascites.  There are innumerable hepatic hypodense lesions, increased from prior study. Some lesion such as the 3.8 x 4.5 cm hypodense lesion in the right lobe the liver adjacent the right hepatic vein likely represents a hemangioma and others were previously characterized as cysts. Other lesions such as a 2.4 x 3.4 cm subcapsular lesion seen in the right hepatic lobe posteriorly (series 2, image 19) are not well characterized but suspicious for metastatic disease. There are nodular implants predominantly involving the surface of the right lobe of the liver with scalloping of the liver parenchyma compatible with subcapsular implants.  The gallbladder is not visualized, likely surgically absent. The pancreas is unremarkable. Surgical clips noted adjacent the head of the pancreas. Multiple hypodense lesions noted predominantly involving the surface of the spleen concerning for metastatic implant. Left adrenal thickening/hyperplasia. There are bilateral renal cortical  irregularity is. There is apparent diffuse thickening of the bladder wall which may be partly related to underdistention. Cystitis is not excluded. Correlation with urinalysis recommended. The prostate and seminal vesicles are grossly unremarkable.  There is an ill-defined irregular nodular mass measuring approximately 3.5 x 2.3 cm involving the distal transverse colon most compatible with malignancy. There is high-grade stenosis of the bowel with dilatation of the colon proximal to this mass. There is no evidence of small bowel obstruction. Scattered sigmoid diverticula without active inflammation.  There is aortoiliac atherosclerotic disease. A 2.6 cm infrarenal abdominal aortic ectasia. 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  There are nodular implants in the left lower abdomen with a combined dimension 1.9 x 4.7 cm. There is a 3.7 x 2.0 cm soft tissue implant is noted in the right lower quadrant adjacent the appendix. Multiple nodular implants are also noted adjacent to the tip of the appendix. Two 2 adjacent implants and free fluid extending from the subhepatic area. However, No definite evidence of acute appendicitis noted.  Multiple nodular densities anterior and inferior to the spleen compatible with metastatic implants.  Retroperitoneal/right periaortic adenopathy measures 1.3 cm in short axis.  Right anterior upper thigh lipoma noted with there is degenerative changes of the spine. No acute fracture.  IMPRESSION: Distal transverse colon obstructing mass most compatible with malignancy. Further evaluation with colonoscopy and tissue sampling recommended. There is high-grade stenosis and dilatation of the colon proximal to this mass.  Peritoneal,  mesenteric, and hepatic metastatic disease.  No definite CT evidence of acute appendicitis.   Electronically Signed   By: Anner Crete M.D.   On: 10/22/2014 19:59   Dg Abd 2 Views  11/06/2014   CLINICAL DATA:  Post ileostomy and right hemicolectomy. Increased distention and abdominal pain.  EXAM: ABDOMEN - 2 VIEW  COMPARISON:  CT 10/22/2014  FINDINGS: Gaseous distension of bowel, predominantly small bowel with air-fluid levels. Cannot exclude small bowel obstruction. No free air. Prior cholecystectomy.  IMPRESSION: Gaseous distention of bowel, predominantly small bowel. Cannot exclude small bowel obstruction.   Electronically Signed   By: Rolm Baptise M.D.   On: 11/06/2014 14:14    Microbiology: No results found for this or any previous visit (from the past 240 hour(s)).   Labs: Basic Metabolic Panel:  Recent Labs Lab 11/04/14 0718 11/05/14 0549 11/06/14 0730 11/07/14 0413  NA 138 141 135 140  K 4.0 4.1 3.9 3.9  CL 110 112* 109 111  CO2 23 23 23  21*  GLUCOSE 95 103* 121* 130*  BUN 11 13 11 11   CREATININE 0.58* 0.66 0.65 0.60*  CALCIUM 7.3* 7.7* 7.9* 8.2*  MG  --   --   --  2.1  PHOS  --   --   --  3.1   Liver Function Tests:  Recent Labs Lab 11/04/14 0718 11/05/14 0549 11/07/14 0413  AST 164* 78* 69*  ALT 159* 107* 78*  ALKPHOS 311* 260* 240*  BILITOT 1.1 0.7 0.3  PROT 6.2* 6.4* 6.8  ALBUMIN 2.2* 1.9* 2.2*   No results for input(s): LIPASE, AMYLASE in the last 168 hours. No results for input(s): AMMONIA in the last 168 hours. CBC:  Recent Labs Lab 11/04/14 0718 11/05/14 0549 11/06/14 0730 11/07/14 0413 11/10/14 0933  WBC 13.1* 13.6* 15.1* 13.7* 12.8*  NEUTROABS  --   --   --  10.3*  --   HGB 8.7* 8.3* 8.7* 8.5* 9.1*  HCT 26.9* 26.2* 26.6* 26.4* 28.5*  MCV 79.6 80.4 79.9 80.0 79.2  PLT 311 369 435* 391 439*   Cardiac Enzymes: No results for input(s): CKTOTAL, CKMB, CKMBINDEX, TROPONINI in the last 168 hours. BNP: BNP (last 3 results) No results for  input(s): BNP in the last 8760 hours.  ProBNP (last 3 results) No results for input(s): PROBNP in the last 8760 hours.  CBG:  Recent Labs Lab 11/07/14 2326 11/08/14 0733 11/08/14 1631 11/08/14 2359 11/09/14 0736  GLUCAP 106* 113* 118* 115* 118*       SignedFlorencia Reasons MD, PhD  [facility] 11/10/2014, 12:30 PM

## 2014-11-05 DIAGNOSIS — E876 Hypokalemia: Secondary | ICD-10-CM

## 2014-11-05 LAB — COMPREHENSIVE METABOLIC PANEL
ALT: 107 U/L — ABNORMAL HIGH (ref 17–63)
AST: 78 U/L — ABNORMAL HIGH (ref 15–41)
Albumin: 1.9 g/dL — ABNORMAL LOW (ref 3.5–5.0)
Alkaline Phosphatase: 260 U/L — ABNORMAL HIGH (ref 38–126)
Anion gap: 6 (ref 5–15)
BUN: 13 mg/dL (ref 6–20)
CHLORIDE: 112 mmol/L — AB (ref 101–111)
CO2: 23 mmol/L (ref 22–32)
Calcium: 7.7 mg/dL — ABNORMAL LOW (ref 8.9–10.3)
Creatinine, Ser: 0.66 mg/dL (ref 0.61–1.24)
Glucose, Bld: 103 mg/dL — ABNORMAL HIGH (ref 65–99)
POTASSIUM: 4.1 mmol/L (ref 3.5–5.1)
SODIUM: 141 mmol/L (ref 135–145)
TOTAL PROTEIN: 6.4 g/dL — AB (ref 6.5–8.1)
Total Bilirubin: 0.7 mg/dL (ref 0.3–1.2)

## 2014-11-05 LAB — CBC
HEMATOCRIT: 26.2 % — AB (ref 39.0–52.0)
HEMOGLOBIN: 8.3 g/dL — AB (ref 13.0–17.0)
MCH: 25.5 pg — ABNORMAL LOW (ref 26.0–34.0)
MCHC: 31.7 g/dL (ref 30.0–36.0)
MCV: 80.4 fL (ref 78.0–100.0)
PLATELETS: 369 10*3/uL (ref 150–400)
RBC: 3.26 MIL/uL — AB (ref 4.22–5.81)
RDW: 19 % — AB (ref 11.5–15.5)
WBC: 13.6 10*3/uL — AB (ref 4.0–10.5)

## 2014-11-05 MED ORDER — KCL IN DEXTROSE-NACL 20-5-0.9 MEQ/L-%-% IV SOLN
INTRAVENOUS | Status: DC
  Administered 2014-11-05 – 2014-11-06 (×3): via INTRAVENOUS
  Filled 2014-11-05 (×4): qty 1000

## 2014-11-05 NOTE — Care Management Important Message (Signed)
Important Message  Patient Details  Name: Reis Goga MRN: [id number] Date of Birth: [birth year]   Medicare Important Message Given:  [facility] notification given    Camillo Flaming 11/05/2014, 1:34 PMImportant Message  Patient Details  Name: Deyon Chizek MRN: [id number] Date of Birth: Jun 15, 1945   Medicare Important Message Given:  Yes-second notification given    Camillo Flaming 11/05/2014, 1:34 PM

## 2014-11-05 NOTE — Progress Notes (Signed)
Physical Therapy Treatment Patient Details Name: Rodney [name] MRN: [id number] DOB: 10-16-1945 Today's Date: 11/05/2014    History of Present Illness 69 y.o. male with a PMH of hypertension, alcohol abuse, PVD, bil AKA, CHF who was admitted 10/22/14 with chief complaint of abdominal pain. Workup in the ED included a CT of the abdomen and pelvis which showed a distal transverse colonic mass with high-grade stenosis concerning for malignancy and pt underwent a laparoscopy with right hemicolectomy, end ileostomy, mucous fistula and cholecystectomy on 10/27/14     PT Comments    Assisted pt OOB using sliding board.  Positioned in recliner.  Instructed NT on how to assist pt back to bed.   Follow Up Recommendations  SNF     Equipment Recommendations       Recommendations for Other Services       Precautions / Restrictions Precautions Precautions: Fall Precaution Comments: bil AKA,  colostomy    Mobility  Bed Mobility Overal bed mobility: Needs Assistance Bed Mobility: Supine to Sit     Supine to sit: Mod assist     General bed mobility comments: assist for trunk upright, assist for controlling descent.  Utilizes the rails.  Transfers Overall transfer level: Needs assistance Equipment used: None Transfers: Lateral/Scoot Transfers (sliding board)           General transfer comment: required assist for set of recliner and sliding board.  Required Max assist to scoot to R and prevent forward LOB.    Ambulation/Gait                 Stairs            Wheelchair Mobility    Modified Rankin (Stroke Patients Only)       Balance                                    Cognition Arousal/Alertness: Awake/alert Behavior During Therapy: WFL for tasks assessed/performed Overall Cognitive Status: Within Functional Limits for tasks assessed                      Exercises      General Comments        Pertinent Vitals/Pain Pain  Assessment: Faces Faces Pain Scale: Hurts a little bit Pain Location: ABD during activity Pain Descriptors / Indicators: Sore Pain Intervention(s): Monitored during session;Repositioned    Home Living                      Prior Function            PT Goals (current goals can now be found in the care plan section) Progress towards PT goals: Progressing toward goals    Frequency  Min 3X/week    PT Plan      Co-evaluation             End of Session Equipment Utilized During Treatment: Gait belt   Patient left: in chair     Time: 2202-5427 PT Time Calculation (min) (ACUTE ONLY): 16 min  Charges:  $Gait Training: 8-22 mins                    G Codes:      Rica Koyanagi  PTA [facility]  Acute  Rehab Pager      [phone]

## 2014-11-05 NOTE — Progress Notes (Signed)
Progress Note   Rodney [name] HUD:[id number] DOB: 1945/07/11 DOA: 10/22/2014 PCP: [name], NP   Brief Narrative:   Rodney [name] is an 69 y.o. male with a PMH of hypertension, alcohol abuse, PVD and CHF who was admitted 10/22/14 with chief complaint of abdominal pain. Workup in the ED included a CT of the abdomen and pelvis which showed a distal transverse colonic mass with high-grade stenosis concerning for malignancy. He initially refused surgical intervention and was felt to have capacity after a psychiatric evaluation. He ultimately consented to surgery and underwent a laparoscopy with right hemicolectomy, end ileostomy, mucous fistula and cholecystectomy on 10/27/14 for palliation. The patient is currently on TNA slow recovery of bowel function. Plan is to discharge him to a SNF when cleared by surgery.  Assessment/Plan:   Principal Problem:   Colorectal adenocarcinoma with widespread metastasis - Initial CT showed a transverse colonic mass with liver, spleen, and intra-abdominal metastasis. - S/P palliative laparoscopy with right hemicolectomy, end ileostomy, mucous fistula and cholecystectomy on 10/27/14. - Pathology consistent with colorectal adenocarcinoma. [name] need outpatient oncology follow-up once healed from surgery, although likely a poor candidate for treatment. - currently afebrile. Monitor wound and WBC. -off PPN, tolerating diet/wound/ostomy management per sugery  Active Problems:   Abdominal aortic aneurysm - Noted incidentally on CT scan. 2.6 cm.    HTN (hypertension) - Continue Coreg. Blood pressure is controlled.    CAD (coronary artery disease), multivessel - Continue statin. Continue Coreg. - Resume aspirin when okay with surgery.    Chronic systolic CHF (congestive heart failure), NYHA class 4 - Status post 2-D echo on 10/25/14: EF 50%. Hypokinesis of inferolateral wall and basal inferior segment noted. LVH. - LV function markedly improved over  prior echocardiogram. Improvement attributed to abstaining from alcohol.    Chronic microcytic anemia / acute blood loss anemia - Likely from acute postoperative blood loss in the setting of chronic GI blood loss.  - Hemoglobin remained stable at 9.2 after receiving 1 unit of PRBCs 10/30/14.    Hypokalemia - Repleted.    Malnutrition of moderate degree/hypophosphatemia - Evaluated by dietitian 10/23/14. Continue supplements. - off PPN, tolerating diet - Monitor for refeeding syndrome. Phosphorus WNL .    Escherichia coli urinary tract infection - Completed a course of therapy with Rocephin 7 days.    Peripheral vascular disease - Status post bilateral AKAs.  FTT: overall poor prognosis due to metastatic colorectal cancer, patient's has poor insight , patient does not seem to understand cancer diagnosis, psych has seen patient determined patient has capacity to make decision.     DVT Prophylaxis - Lovenox when okay with surgery. Unable to do SCDs given bilateral AKAs.  Family Communication: No family at the bedside. Declines my offer to call. Disposition Plan: SNF when ok with surgery Code Status:     Code Status Orders        Start     Ordered   10/22/14 2150  Full code   Continuous     10/22/14 2149        IV Access:    Peripheral IV   Procedures and diagnostic studies:   Ct Abdomen Pelvis W Contrast  10/22/2014   CLINICAL DATA:  69 year old male with right lower quadrant and periumbilical pain. Emesis  EXAM: CT ABDOMEN AND PELVIS WITH CONTRAST  TECHNIQUE: Multidetector CT imaging of the abdomen and pelvis was performed using the standard protocol following bolus administration of intravenous contrast.  CONTRAST:  42mL  OMNIPAQUE IOHEXOL 300 MG/ML SOLN, 112mL OMNIPAQUE IOHEXOL 300 MG/ML SOLN  COMPARISON:  CT dated 08/21/2013  FINDINGS: Partially visualized small right pleural profusion. Top-normal cardiac size. Right cardiophrenic angle top-normal lymph nodes. No  intra-abdominal free air. Small ascites.  There are innumerable hepatic hypodense lesions, increased from prior study. Some lesion such as the 3.8 x 4.5 cm hypodense lesion in the right lobe the liver adjacent the right hepatic vein likely represents a hemangioma and others were previously characterized as cysts. Other lesions such as a 2.4 x 3.4 cm subcapsular lesion seen in the right hepatic lobe posteriorly (series 2, image 19) are not well characterized but suspicious for metastatic disease. There are nodular implants predominantly involving the surface of the right lobe of the liver with scalloping of the liver parenchyma compatible with subcapsular implants.  The gallbladder is not visualized, likely surgically absent. The pancreas is unremarkable. Surgical clips noted adjacent the head of the pancreas. Multiple hypodense lesions noted predominantly involving the surface of the spleen concerning for metastatic implant. Left adrenal thickening/hyperplasia. There are bilateral renal cortical irregularity is. There is apparent diffuse thickening of the bladder wall which may be partly related to underdistention. Cystitis is not excluded. Correlation with urinalysis recommended. The prostate and seminal vesicles are grossly unremarkable.  There is an ill-defined irregular nodular mass measuring approximately 3.5 x 2.3 cm involving the distal transverse colon most compatible with malignancy. There is high-grade stenosis of the bowel with dilatation of the colon proximal to this mass. There is no evidence of small bowel obstruction. Scattered sigmoid diverticula without active inflammation.  There is aortoiliac atherosclerotic disease. A 2.6 cm infrarenal abdominal aortic ectasia. 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  There are nodular implants in the left lower abdomen with a combined dimension 1.9 x 4.7 cm. There is a 3.7 x 2.0 cm soft tissue implant is noted in the right lower quadrant adjacent the appendix. Multiple nodular implants are also noted adjacent to the tip of the appendix. Two 2 adjacent implants and free fluid extending from the subhepatic area. However, No definite evidence of acute appendicitis noted.  Multiple nodular densities anterior and inferior to the spleen compatible with metastatic implants.  Retroperitoneal/right periaortic adenopathy measures 1.3 cm in short axis.  Right anterior upper thigh lipoma noted with there is degenerative changes of the spine. No acute fracture.  IMPRESSION: Distal transverse colon obstructing mass most compatible with malignancy. Further evaluation with colonoscopy and tissue sampling recommended. There is high-grade stenosis and dilatation of the colon proximal to this mass.  Peritoneal, mesenteric, and hepatic metastatic disease.  No definite CT evidence of acute appendicitis.   Electronically Signed   By: Anner Crete M.D.   On: 10/22/2014 19:59     Medical Consultants:    Psychiatry  General surgery  GI  Cardiology  Anti-Infectives:    Ceftriaxone 10/22/2014> 10/28/14  Subjective:   Rodney [name] Reported feeling better, denies pain.  poor insight about cancer diagnosis. Say "whatever" to most of the questions asked.  Objective:    Filed Vitals:   11/04/14 1415 11/04/14 2057 11/05/14 0503 11/05/14 1313  BP: 115/57 111/54 100/50 95/58  Pulse: 100 100 96 65  Temp: 98.6 F (37 C) 98.2 F (36.8  C) 98.5 F (36.9 C) 98.5 F (36.9 C)  TempSrc: Oral Oral Oral Oral  Resp: 20  20 18   Height:      Weight:      SpO2: 96% 97% 96% 98%    Intake/Output Summary (Last 24 hours) at 11/05/14 1801 Last data filed at 11/05/14 1600  Gross per 24 hour  Intake    480 ml    Output   2300 ml  Net  -1820 ml    Exam: Gen:  NAD, flat affect, replies " whatever" to most of the questions. Cardiovascular:  RRR, II/VI SEM Respiratory:  Lungs CTAB Gastrointestinal:  Abdomen soft, +BS, ostomy draining, post op changes dressing intact, no visible drainage Extremities:  Bilateral amputee   Data Reviewed:    Labs: Basic Metabolic Panel:  Recent Labs Lab 10/30/14 0545 10/31/14 0528 11/01/14 0617 11/04/14 0718 11/05/14 0549  NA 135 137 137 138 141  K 4.1 4.1 4.3 4.0 4.1  CL 109 109 108 110 112*  CO2 23 23 23 23 23   GLUCOSE 151* 112* 139* 95 103*  BUN 12 9 10 11 13   CREATININE 0.62 0.54* 0.61 0.58* 0.66  CALCIUM 7.8* 7.7* 8.0* 7.3* 7.7*  MG 2.0 1.9  --   --   --   PHOS 1.3* 3.0 2.7  --   --    GFR Estimated Creatinine Clearance: 61.7 mL/min (by C-G formula based on Cr of 0.66). Liver Function Tests:  Recent Labs Lab 10/30/14 0545 11/04/14 0718 11/05/14 0549  AST 64* 164* 78*  ALT 34 159* 107*  ALKPHOS 104 311* 260*  BILITOT 0.4 1.1 0.7  PROT 5.9* 6.2* 6.4*  ALBUMIN 2.0* 2.2* 1.9*   CBC:  Recent Labs Lab 10/30/14 0545 10/31/14 0528 11/01/14 0617 11/04/14 0718 11/05/14 0549  WBC 12.4* 14.3* 14.8* 13.1* 13.6*  NEUTROABS 9.8*  --   --   --   --   HGB 6.9* 9.2* 9.2* 8.7* 8.3*  HCT 21.0* 28.3* 28.9* 26.9* 26.2*  MCV 77.8* 79.7 80.5 79.6 80.4  PLT 275 243 238 311 369   CBG:  Recent Labs Lab 11/01/14 1131 11/01/14 1735 11/02/14 0032 11/02/14 0644 11/02/14 1316  GLUCAP 123* 118* 119* 123* 96   Microbiology No results found for this or any previous visit (from the past 240 hour(s)).   Medications:   . antiseptic oral rinse  7 mL Mouth Rinse q12n4p  . atorvastatin  40 mg Oral QPM  . carvedilol  6.25 mg Oral BID WC  . chlorhexidine  15 mL Mouth Rinse BID  . dicyclomine  20 mg Oral QHS  . escitalopram  5 mg Oral Daily  . feeding supplement  1 Container Oral TID BM  . feeding supplement (ENSURE ENLIVE)  237 mL Oral BID BM   . folic acid  1 mg Oral Daily  . loperamide  2 mg Oral BID  . megestrol  40 mg Oral Daily  . mirtazapine  30 mg Oral QHS  . sodium chloride  10-40 mL Intracatheter Q12H  . thiamine  100 mg Oral Daily   Continuous Infusions: . dextrose 5 % and 0.9 % NaCl with KCl 20 mEq/L 75 mL/hr at 11/05/14 1001    Time spent: 25 minutes.   LOS: 14 days   Rodney [name]  [facility] Pager [phone].  *Please refer to [email or web address], password [email or web address] to get updated schedule on who will round on this patient, as hospitalists switch teams weekly. If 7PM-7AM, please contact night-coverage at [email or web address], password  [email or web address] for any overnight needs.  11/05/2014, 6:01 PM

## 2014-11-05 NOTE — Progress Notes (Signed)
9 Days Post-Op  Subjective: He reports he vomited x 1 last PM, he felt better after that, I can't tell what he is taking PO.  With current I/O I don't know where he is with intake, nothing recorded, he can't tell me.  He says he was not out of bed yesterday either.  Techs said he ate nothing yesterday.  Objective: Vital signs in last 24 hours: Temp:  [98.2 F (36.8 C)-98.6 F (37 C)] 98.5 F (36.9 C) (08/04 0503) Pulse Rate:  [96-100] 96 (08/04 0503) Resp:  [20] 20 (08/04 0503) BP: (100-115)/(50-57) 100/50 mmHg (08/04 0503) SpO2:  [96 %-97 %] 96 % (08/04 0503) Last BM Date: 11/03/14 350 urine output recorded 1225 from the colostomy, nothing PO or IV recorded.  I ordered a liter of saline I will add daily weights BP 100/50 this AM CMP stable, WBC, is still up with ongoing anemia Intake/Output from previous day: 08/03 0701 - 08/04 0700 In: -  Out: 7322 [Urine:350; Stool:1225] Intake/Output this shift:    General appearance: alert and no distress GI: +BS, ileostomy has more color today, not as clear.  Open wound is about the same.  sutures are loose now, packing wet to dry I did not find any pockets of infection.  Lab Results:   Recent Labs  11/04/14 0718 11/05/14 0549  WBC 13.1* 13.6*  HGB 8.7* 8.3*  HCT 26.9* 26.2*  PLT 311 369    BMET  Recent Labs  11/04/14 0718 11/05/14 0549  NA 138 141  K 4.0 4.1  CL 110 112*  CO2 23 23  GLUCOSE 95 103*  BUN 11 13  CREATININE 0.58* 0.66  CALCIUM 7.3* 7.7*   PT/INR No results for input(s): LABPROT, INR in the last 72 hours.   Recent Labs Lab 10/30/14 0545 11/04/14 0718 11/05/14 0549  AST 64* 164* 78*  ALT 34 159* 107*  ALKPHOS 104 311* 260*  BILITOT 0.4 1.1 0.7  PROT 5.9* 6.2* 6.4*  ALBUMIN 2.0* 2.2* 1.9*     Lipase     Component Value Date/Time   LIPASE 30 10/22/2014 1707     Studies/Results: No results found.  Medications: . antiseptic oral rinse  7 mL Mouth Rinse q12n4p  . atorvastatin  40 mg  Oral QPM  . carvedilol  6.25 mg Oral BID WC  . chlorhexidine  15 mL Mouth Rinse BID  . dicyclomine  20 mg Oral QHS  . escitalopram  5 mg Oral Daily  . feeding supplement  1 Container Oral TID BM  . feeding supplement (ENSURE ENLIVE)  237 mL Oral BID BM  . folic acid  1 mg Oral Daily  . loperamide  2 mg Oral BID  . megestrol  40 mg Oral Daily  . mirtazapine  30 mg Oral QHS  . sodium chloride  10-40 mL Intracatheter Q12H  . thiamine  100 mg Oral Daily    Assessment/Plan Obstructing colon cancer with peritoneal carcinomatosis Diagnostic laparoscopy, right hemicolectomy, end ileostomy, mucous fistula, cholecystectomy, 10/27/14, Dr. Barry Dienes POD 8 Post op ileus PVOD/bilateral AKA Hypertension Hx of tobacco ongoing use ETOH use Hx of CHF Anemia - being transfused 1 unit of PRBC now. Malnutrition TNA discontinued Antibiotics: 6 days of ceftriaxone, 2 doses of flagyl pre op. All currently discontinued DVT: None anemia/no heparin, bilateral LE Amputations/no SCD   Plan:  Daily weights, he needs better I/O, I am going to give him some fluids.  He denies any nausea this AM.  I don't think  he is ready for SNF at this point.  Follow labs. Calorie count and dietician to assist.    LOS: 14 days    Valeen Borys 11/05/2014

## 2014-11-05 NOTE — Progress Notes (Signed)
Nutrition Follow-up  DOCUMENTATION CODES:   Non-severe (moderate) malnutrition in context of chronic illness  INTERVENTION:   -Continue Ensure Enlive po BID, each supplement provides 350 kcal and 20 grams of protein -Continue Boost Breeze po TID, each supplement provides 250 kcal and 9 grams of protein -Provided "Ileostomy Nutrition Therapy" handout from [employer]. -Calorie Count 8/4-8/5 -results pending -Encourage PO intake -RD to continue to monitor  NUTRITION DIAGNOSIS:   Inadequate oral intake related to poor appetite as evidenced by meal completion < 25%.  Ongoing.  GOAL:   Patient will meet greater than or equal to 90% of their needs  Not meeting.  MONITOR:   PO intake, Supplement acceptance, Labs, Weight trends, Skin, I & O's  REASON FOR ASSESSMENT:   Consult Assessment of nutrition requirement/status (Calorie Count)  ASSESSMENT:   69 y.o. male with multiple medical problems presents with abdominal pain. Patient states that the pain started about 3 days ago and is mainly located in the right side lower quadrant. He states that he has had a poor appetite also. He states he has been losing weight. He has had some diarrhea. He admits to nausea and also some vomting. He states he has no blood in his stools. Patient has prior history of vascular disease and has had bilateral amputations. He states that he does smoke. He states that he has a history of ETOH use in the past no drinks in the past 2-3 years. In the ED he had a CT of the abdomen done and this shows presence of a colonic mass likely malignant.  Pt reports he feels that his appetite has improved slightly since starting Megace. Pt ate all his oatmeal this morning and was drinking his supplements during RD visit. Encouraged pt to continue sipping his liquids throughout the day. Provided "Ileostomy Nutrition Therapy" handout from [employer]. PO intake: 25%  Labs  reviewed.  Diet Order:  Diet regular Room service appropriate?: Yes; Fluid consistency:: Thin Diet - low sodium heart healthy  Skin:  Reviewed, no issues  Last BM:  8/2 -colostomy output: 550 ml  Height:   Ht Readings from Last 1 Encounters:  10/29/14 5\' 11"  (1.803 m)    Weight:   Wt Readings from Last 1 Encounters:  10/23/14 112 lb 1.6 oz (50.848 kg)    Ideal Body Weight:  63.6 kg  BMI:  Body mass index is 15.64 kg/(m^2).  Estimated Nutritional Needs:   Kcal:  1100-1300  Protein:  55-65 grams  Fluid:  1.7 L/day  EDUCATION NEEDS:   No education needs identified at this time  Clayton Bibles, MS, RD, LDN Pager: [phone] After Hours Pager: [phone]

## 2014-11-05 NOTE — Consult Note (Signed)
WOC ostomy follow up Stoma type/location: RLQ ileostomy with mucus fistula Stomal assessment/size: 1 and 5/8 inch slightly oval stoma, red, moist. Slightly budded and tape of ostomy product is in near proximity to midline wound (trimmed slightly prior to application to avoid overlap). Peristomal assessment: intact, clear Treatment options for stomal/peristomal skin: skin barrier ring Output thin brown effluent with stool particles Ostomy pouching: 2pc. 2 and 1/4 inches with skin barrier ring Education provided: Patient is shown how to perform Lock and Roll closure today and gives return demonstration successfully with no cueing. Was nauseated yesterday and has little appetite today. Does not speak much during our encounter today, but I do get a small smile prior to leaving. Spare pouching systems and his educational booklet are in the room. Enrolled patient in [facility] Start Discharge program: Yes [place] nursing team will continue to follow, and will remain available to this patient, the nursing, surgical and medical teams.   Thanks, Maudie Flakes, MSN, RN, [place], [place], [place] [phone])

## 2014-11-06 ENCOUNTER — Inpatient Hospital Stay (HOSPITAL_COMMUNITY): Payer: Medicare Other

## 2014-11-06 LAB — BASIC METABOLIC PANEL
Anion gap: 3 — ABNORMAL LOW (ref 5–15)
BUN: 11 mg/dL (ref 6–20)
CO2: 23 mmol/L (ref 22–32)
Calcium: 7.9 mg/dL — ABNORMAL LOW (ref 8.9–10.3)
Chloride: 109 mmol/L (ref 101–111)
Creatinine, Ser: 0.65 mg/dL (ref 0.61–1.24)
Glucose, Bld: 121 mg/dL — ABNORMAL HIGH (ref 65–99)
Potassium: 3.9 mmol/L (ref 3.5–5.1)
Sodium: 135 mmol/L (ref 135–145)

## 2014-11-06 LAB — CBC
HCT: 26.6 % — ABNORMAL LOW (ref 39.0–52.0)
Hemoglobin: 8.7 g/dL — ABNORMAL LOW (ref 13.0–17.0)
MCH: 26.1 pg (ref 26.0–34.0)
MCHC: 32.7 g/dL (ref 30.0–36.0)
MCV: 79.9 fL (ref 78.0–100.0)
Platelets: 435 10*3/uL — ABNORMAL HIGH (ref 150–400)
RBC: 3.33 MIL/uL — ABNORMAL LOW (ref 4.22–5.81)
RDW: 19.3 % — ABNORMAL HIGH (ref 11.5–15.5)
WBC: 15.1 10*3/uL — AB (ref 4.0–10.5)

## 2014-11-06 LAB — PREALBUMIN: Prealbumin: 5.9 mg/dL — ABNORMAL LOW (ref 18–38)

## 2014-11-06 MED ORDER — FAT EMULSION 20 % IV EMUL
250.0000 mL | INTRAVENOUS | Status: AC
  Administered 2014-11-06: 250 mL via INTRAVENOUS
  Filled 2014-11-06: qty 250

## 2014-11-06 MED ORDER — INSULIN ASPART 100 UNIT/ML ~~LOC~~ SOLN: 0.0000 [IU] | Freq: Three times a day (TID) | SUBCUTANEOUS | Status: DC

## 2014-11-06 MED ORDER — M.V.I. ADULT IV INJ
INJECTION | INTRAVENOUS | Status: AC
  Administered 2014-11-06: 20:00:00 via INTRAVENOUS
  Filled 2014-11-06: qty 960

## 2014-11-06 MED ORDER — KCL IN DEXTROSE-NACL 20-5-0.9 MEQ/L-%-% IV SOLN
INTRAVENOUS | Status: DC
  Administered 2014-11-07 – 2014-11-10 (×3): via INTRAVENOUS
  Filled 2014-11-06 (×5): qty 1000

## 2014-11-06 NOTE — Progress Notes (Signed)
PARENTERAL NUTRITION CONSULT NOTE - FOLLOW UP  Pharmacy Consult for TPN Indication: inadequate PO intake  No Known Allergies  Patient Measurements: Height: 5\' 11"  (180.3 cm) Weight: 111 lb (50.349 kg) IBW/kg (Calculated) : 75.3 IBW calculated based on height of 5'11". Current total body weight much lower than IBW due to bilateral AKAs.  Vital Signs: Temp: 98.9 F (37.2 C) (08/05 1302) Temp Source: Oral (08/05 1302) BP: 99/50 mmHg (08/05 1302) Pulse Rate: 88 (08/05 1302) Intake/Output from previous day: 08/04 0701 - 08/05 0700 In: 5056 [P.O.:480; I.V.:4576] Out: 2800 [Urine:750; Stool:2050] Intake/Output from this shift: Total I/O In: 120 [P.O.:120] Out: 1075 [Urine:200; Emesis/NG output:200; Stool:675]  Labs:  Recent Labs  11/04/14 0718 11/05/14 0549 11/06/14 0730  WBC 13.1* 13.6* 15.1*  HGB 8.7* 8.3* 8.7*  HCT 26.9* 26.2* 26.6*  PLT 311 369 435*     Recent Labs  11/04/14 0718 11/05/14 0549 11/06/14 0730  NA 138 141 135  K 4.0 4.1 3.9  CL 110 112* 109  CO2 23 23 23   GLUCOSE 95 103* 121*  BUN 11 13 11   CREATININE 0.58* 0.66 0.65  CALCIUM 7.3* 7.7* 7.9*  PROT 6.2* 6.4*  --   ALBUMIN 2.2* 1.9*  --   AST 164* 78*  --   ALT 159* 107*  --   ALKPHOS 311* 260*  --   BILITOT 1.1 0.7  --   PREALBUMIN  --   --  5.9*   Estimated Creatinine Clearance: 61.1 mL/min (by C-G formula based on Cr of 0.65).   No results for input(s): GLUCAP in the last 72 hours.  Medications:  Infusions:  . dextrose 5 % and 0.9 % NaCl with KCl 20 mEq/L 75 mL/hr at 11/06/14 0429    Insulin Requirements: None  Current Nutrition:  Diet: Regular Supplements: Boost/Resource Breese TID, Ensure Enlive BID  Calorie count in progress by RD 8/4: 514 kcal (47% min needs), 12g protein (21.5% min needs)  IVF: D5 NS + KCl 20 mEq/L @ 75 ml/min  Central access: PICC 7/28 TPN start date: 7/28 - 8/1, 8/5   ASSESSMENT                                                                                                           HPI: 69 yo male, bilateral AK amputee, admitted 10/22/14 with abdominal pain. CT of the abdomen and pelvis showed colonic mass revealed by pathology as adenocarcinoma.  On 10/27/14, he underwent a palliative right hemicolectomy, end ileostomy, mucous fistula and cholecystectomy. Pharmacy was initially consulted 7/28 to begin TPN for postop ileus.  Ostomy began functioning and he began eating a regular diet on 7/31; TPN was d/c.  His ostomy appears to be functioning (air and liquid in bag), but he is not eating much.  RD has started a calorie count.  Pharmacy was consulted to resume TPN on 8/5.  Significant events:  7/26 palliative right hemicolectomy, end ileostomy, mucous fistula and cholecystectomy 7/28 TPN started 7/31: diet advanced to regular, per MD d/c TPN 8/5 TPN resumed for poor  oral intake.  Today:   Glucose: at goal < 150  Electrolytes:  WNL, CorrCa 9.58 (mag and phos pending)  Renal:  SCr 0.65  LFTs:  Elevated but improved AST/ALT 78/107  TGs:  158 (7/29)  Prealbumin:  4.1 (7/29), 5.9 (8/5)   NUTRITIONAL GOALS                                                                                           RD recs (8/4): 1100-1300 kcal, 55-65 g protein per day Clinimix 5/15 at a goal rate of 50 ml/hr + 20% fat emulsion at 10ml/hr to provide: 60g/day protein, 1332Kcal/day.  PLAN                                                                                                                         At 1800 today:  Start Clinimix E 5/15 at 40 ml/hr.  20% fat emulsion at 10 ml/hr.  Plan to advance as tolerated to the goal rate.  TPN to contain standard multivitamins and trace elements.  Reduce IVF to 35 ml/hr.  Add CBGs and sensitive SSI q8h.  TPN lab panels on Mondays & Thursdays.  F/u daily.   Gretta Arab PharmD, BCPS Pager [phone] 11/06/2014 6:30 PM

## 2014-11-06 NOTE — Progress Notes (Signed)
Patient's b/p low this afternoon- MD notified. Awaiting any further orders

## 2014-11-06 NOTE — Progress Notes (Signed)
CSW continuing to follow.   Pt plans for [facility] when pt medically ready for discharge.  Per MD, pt not yet medically ready for discharge today and unlikely that pt will be medically stable for discharge over the weekend.   CSW spoke with [facility] and updated facility.  CSW to continue to follow to provide support and assist with pt discharge to [facility] when pt medically ready for discharge.  Alison Murray, MSW, [place] Work [phone]

## 2014-11-06 NOTE — Progress Notes (Signed)
Pt refusing lab draw this morning. Will try again later in the day.

## 2014-11-06 NOTE — Progress Notes (Signed)
Calorie Count Note   48 hour calorie count ordered.  Diet: Regular Supplements: Ensure Enlive BID, Boost Breeze BID  Breakfast 8/4: 100% coffee, 2 ounces orange juice, 100% oatmeal (99 kcal, 4.5 grams protein) Lunch 8/4: 100% Sprite, 50% hamburger (275 kcal, 7.3 grams protein) Dinner 8/4: 100% Sprite (140 kcal, 0 grams protein) Supplements: no documented intakes  Total intake: 514 kcal (47% of minimum estimated needs)  12 protein (21.5% of minimum estimated needs)  Nutrition Dx: Inadequate oral intake related to poor appetite as evidenced by meal completion < 25%.  Goal: Patient will meet greater than or equal to 90% of their needs  Intervention:  -Continue Ensure Enlive po BID, each supplement provides 350 kcal and 20 grams of protein -Continue Boost Breeze po TID, each supplement provides 250 kcal and 9 grams of protein -Provided "Ileostomy Nutrition Therapy" handout from [employer]. -Calorie Count 8/4-8/5 -results pending -Encourage PO intake -RD to continue to monitor     Jarome Matin, RD, LDN Inpatient Clinical Dietitian Pager # [phone] After hours/weekend pager # [phone]

## 2014-11-06 NOTE — Progress Notes (Signed)
10 Days Post-Op  Subjective: He is putting out allot of fluid today and recorded 2 liters yesterday.   The intake doesn't seem right, but i believe the output.  He is distended this AM, and he still has a WBC elevation.   No appetite, some nausea this Am but no vomiting. Objective: Vital signs in last 24 hours: Temp:  [98.5 F (36.9 C)-99.2 F (37.3 C)] 98.7 F (37.1 C) (08/05 0543) Pulse Rate:  [65-84] 84 (08/05 0543) Resp:  [17-18] 17 (08/05 0543) BP: (95-110)/(51-59) 107/51 mmHg (08/05 0543) SpO2:  [97 %-98 %] 98 % (08/05 0543) Weight:  [50.349 kg (111 lb)] 50.349 kg (111 lb) (08/05 0606) Last BM Date: 11/06/14 PO 480 recorded Stool:  2050 from his ostomy 4 liters IV recorded Afebrile, BP and HR OK WBC is still up and rising Intake/Output from previous day: 08/04 0701 - 08/05 0700 In: 5056 [P.O.:480; I.V.:4576] Out: 2800 [Urine:750; Stool:2050] Intake/Output this shift: Total I/O In: -  Out: 150 [Stool:150]  General appearance: alert and he is very depressed, just sits there and lets you do what you need. to.  Tray at beside looks like some oatmeal and ice cream, not very much Resp: clear to auscultation bilaterally GI: distened, some nausea, + BS ostomy working well right now.    Lab Results:   Recent Labs  11/05/14 0549 11/06/14 0730  WBC 13.6* 15.1*  HGB 8.3* 8.7*  HCT 26.2* 26.6*  PLT 369 435*    BMET  Recent Labs  11/05/14 0549 11/06/14 0730  NA 141 135  K 4.1 3.9  CL 112* 109  CO2 23 23  GLUCOSE 103* 121*  BUN 13 11  CREATININE 0.66 0.65  CALCIUM 7.7* 7.9*   PT/INR No results for input(s): LABPROT, INR in the last 72 hours.   Recent Labs Lab 11/04/14 0718 11/05/14 0549  AST 164* 78*  ALT 159* 107*  ALKPHOS 311* 260*  BILITOT 1.1 0.7  PROT 6.2* 6.4*  ALBUMIN 2.2* 1.9*     Lipase     Component Value Date/Time   LIPASE 30 10/22/2014 1707     Studies/Results: No results found.  Medications: . antiseptic oral rinse  7 mL Mouth  Rinse q12n4p  . atorvastatin  40 mg Oral QPM  . carvedilol  6.25 mg Oral BID WC  . chlorhexidine  15 mL Mouth Rinse BID  . dicyclomine  20 mg Oral QHS  . escitalopram  5 mg Oral Daily  . feeding supplement  1 Container Oral TID BM  . feeding supplement (ENSURE ENLIVE)  237 mL Oral BID BM  . folic acid  1 mg Oral Daily  . loperamide  2 mg Oral BID  . megestrol  40 mg Oral Daily  . mirtazapine  30 mg Oral QHS  . sodium chloride  10-40 mL Intracatheter Q12H  . thiamine  100 mg Oral Daily   . dextrose 5 % and 0.9 % NaCl with KCl 20 mEq/L 75 mL/hr at 11/06/14 4401    Assessment/Plan Obstructing colon cancer with peritoneal carcinomatosis Diagnostic laparoscopy, right hemicolectomy, end ileostomy, mucous fistula, cholecystectomy, 10/27/14, Dr. Barry Dienes POD 8 Post op ileus PVOD/bilateral AKA Hypertension Hx of tobacco ongoing use ETOH use Hx of CHF Anemia - being transfused 1 unit of PRBC now. Malnutrition TNA discontinued Antibiotics: 6 days of ceftriaxone, 2 doses of flagyl pre op. All currently discontinued DVT: None anemia/no heparin, bilateral LE Amputations/no SCD  Plan:  It seems the nurse tech was listing the  ostomy output with the urine yesterday, so I don't know what is correct.  What I see in the ostomy now is very clear fluid, and it's about 1/2 to 3/4 full.  His abdomen is distended and he seems more uncomfortable. I will get some films this Am, and continue IV.  I will discuss ileostomy treatment after I see the film.  Wound is no better or worse.       LOS: 15 days    Corin Formisano 11/06/2014

## 2014-11-06 NOTE — Progress Notes (Addendum)
Progress Note   Rodney [name] WEX:[id number] DOB: 04-Aug-1945 DOA: 10/22/2014 PCP: [name], NP   Brief Narrative:   Rodney [name] is an 69 y.o. male with a PMH of hypertension, alcohol abuse, PVD and CHF who was admitted 10/22/14 with chief complaint of abdominal pain. Workup in the ED included a CT of the abdomen and pelvis which showed a distal transverse colonic mass with high-grade stenosis concerning for malignancy. He initially refused surgical intervention and was felt to have capacity after a psychiatric evaluation. He ultimately consented to surgery and underwent a laparoscopy with right hemicolectomy, end ileostomy, mucous fistula and cholecystectomy on 10/27/14 for palliation. The patient is currently on TNA slow recovery of bowel function. Plan is to discharge him to a SNF when cleared by surgery.  Assessment/Plan:   Principal Problem:   Colorectal adenocarcinoma with widespread metastasis - Initial CT showed a transverse colonic mass with liver, spleen, and intra-abdominal metastasis. - S/P palliative laparoscopy with right hemicolectomy, end ileostomy, mucous fistula and cholecystectomy on 10/27/14. - Pathology consistent with colorectal adenocarcinoma. [name] need outpatient oncology follow-up once healed from surgery, although likely a poor candidate for treatment. - currently afebrile. Monitor wound and WBC. -off PPN, tolerating diet/wound/ostomy management per surgery -intake and output not accurately documented, will f/u surgery recommendations  Active Problems:   Abdominal aortic aneurysm - Noted incidentally on CT scan. 2.6 cm.    HTN (hypertension) - has been on coreg, bp low normal on 8/5, d/c coreg, close monitor bp     CAD (coronary artery disease), multivessel - Continue statin. Continue Coreg. - Resume aspirin when okay with surgery.    Chronic systolic CHF (congestive heart failure), NYHA class 4 - Status post 2-D echo on 10/25/14: EF 50%.  Hypokinesis of inferolateral wall and basal inferior segment noted. LVH. - LV function markedly improved over prior echocardiogram. Improvement attributed to abstaining from alcohol.    Chronic microcytic anemia / acute blood loss anemia - Likely from acute postoperative blood loss in the setting of chronic GI blood loss.  - Hemoglobin remained stable at 9.2 after receiving 1 unit of PRBCs 10/30/14.    Hypokalemia - Repleted.    Malnutrition of moderate degree/hypophosphatemia - Evaluated by dietitian 10/23/14. Continue supplements. - off PPN, tolerating diet - Monitor for refeeding syndrome. Phosphorus WNL .    Escherichia coli urinary tract infection - Completed a course of therapy with Rocephin 7 days.    Peripheral vascular disease - Status post bilateral AKAs.  FTT: overall poor prognosis due to metastatic colorectal cancer, patient's has poor insight , patient does not seem to understand cancer diagnosis, psych has seen patient determined patient has capacity to make decision.     DVT Prophylaxis - Lovenox when okay with surgery. Unable to do SCDs given bilateral AKAs.  Family Communication: No family at the bedside. Declines my offer to call. Disposition Plan: SNF when ok with surgery Code Status:     Code Status Orders        Start     Ordered   10/22/14 2150  Full code   Continuous     10/22/14 2149        IV Access:    Peripheral IV   Procedures and diagnostic studies:   Ct Abdomen Pelvis W Contrast  10/22/2014   CLINICAL DATA:  69 year old male with right lower quadrant and periumbilical pain. Emesis  EXAM: CT ABDOMEN AND PELVIS WITH CONTRAST  TECHNIQUE: Multidetector CT imaging of the  abdomen and pelvis was performed using the standard protocol following bolus administration of intravenous contrast.  CONTRAST:  81mL OMNIPAQUE IOHEXOL 300 MG/ML SOLN, 162mL OMNIPAQUE IOHEXOL 300 MG/ML SOLN  COMPARISON:  CT dated 08/21/2013  FINDINGS: Partially visualized  small right pleural profusion. Top-normal cardiac size. Right cardiophrenic angle top-normal lymph nodes. No intra-abdominal free air. Small ascites.  There are innumerable hepatic hypodense lesions, increased from prior study. Some lesion such as the 3.8 x 4.5 cm hypodense lesion in the right lobe the liver adjacent the right hepatic vein likely represents a hemangioma and others were previously characterized as cysts. Other lesions such as a 2.4 x 3.4 cm subcapsular lesion seen in the right hepatic lobe posteriorly (series 2, image 19) are not well characterized but suspicious for metastatic disease. There are nodular implants predominantly involving the surface of the right lobe of the liver with scalloping of the liver parenchyma compatible with subcapsular implants.  The gallbladder is not visualized, likely surgically absent. The pancreas is unremarkable. Surgical clips noted adjacent the head of the pancreas. Multiple hypodense lesions noted predominantly involving the surface of the spleen concerning for metastatic implant. Left adrenal thickening/hyperplasia. There are bilateral renal cortical irregularity is. There is apparent diffuse thickening of the bladder wall which may be partly related to underdistention. Cystitis is not excluded. Correlation with urinalysis recommended. The prostate and seminal vesicles are grossly unremarkable.  There is an ill-defined irregular nodular mass measuring approximately 3.5 x 2.3 cm involving the distal transverse colon most compatible with malignancy. There is high-grade stenosis of the bowel with dilatation of the colon proximal to this mass. There is no evidence of small bowel obstruction. Scattered sigmoid diverticula without active inflammation.  There is aortoiliac atherosclerotic disease. A 2.6 cm infrarenal abdominal aortic ectasia. 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  There are nodular implants in the left lower abdomen with a combined dimension 1.9 x 4.7 cm. There is a 3.7 x 2.0 cm soft tissue implant is noted in the right lower quadrant adjacent the appendix. Multiple nodular implants are also noted adjacent to the tip of the appendix. Two 2 adjacent implants and free fluid extending from the subhepatic area. However, No definite evidence of acute appendicitis noted.  Multiple nodular densities anterior and inferior to the spleen compatible with metastatic implants.  Retroperitoneal/right periaortic adenopathy measures 1.3 cm in short axis.  Right anterior upper thigh lipoma noted with there is degenerative changes of the spine. No acute fracture.  IMPRESSION: Distal transverse colon obstructing mass most compatible with malignancy. Further evaluation with colonoscopy and tissue sampling recommended. There is high-grade stenosis and dilatation of the colon proximal to this mass.  Peritoneal, mesenteric, and hepatic metastatic disease.  No definite CT evidence of acute appendicitis.   Electronically Signed   By: Anner Crete M.D.   On: 10/22/2014 19:59     Medical Consultants:    Psychiatry  General surgery  GI  Cardiology  Anti-Infectives:    Ceftriaxone 10/22/2014> 10/28/14  Subjective:   Rodney [name] Reported feeling better, poor insight about cancer diagnosis. Say "whatever" to most of the questions asked.  Objective:    Filed Vitals:   11/05/14 1313 11/05/14 2126 11/06/14 0543 11/06/14 0606  BP: 95/58 110/59 107/51  Pulse: 65 83 84   Temp: 98.5 F (36.9 C) 99.2 F (37.3 C) 98.7 F (37.1 C)   TempSrc: Oral Oral Oral   Resp: 18 18 17    Height:      Weight:    50.349 kg (111 lb)  SpO2: 98% 97% 98%     Intake/Output Summary (Last 24 hours) at 11/06/14  1201 Last data filed at 11/06/14 1001  Gross per 24 hour  Intake   4696 ml  Output   2050 ml  Net   2646 ml    Exam: Gen:  NAD, flat affect, replies " whatever" to most of the questions. Cardiovascular:  RRR, II/VI SEM Respiratory:  Lungs CTAB Gastrointestinal:  Abdomen soft, +BS, ostomy draining, post op changes dressing intact, no visible drainage Extremities:  Bilateral amputee   Data Reviewed:    Labs: Basic Metabolic Panel:  Recent Labs Lab 10/31/14 0528 11/01/14 0617 11/04/14 0718 11/05/14 0549 11/06/14 0730  NA 137 137 138 141 135  K 4.1 4.3 4.0 4.1 3.9  CL 109 108 110 112* 109  CO2 23 23 23 23 23   GLUCOSE 112* 139* 95 103* 121*  BUN 9 10 11 13 11   CREATININE 0.54* 0.61 0.58* 0.66 0.65  CALCIUM 7.7* 8.0* 7.3* 7.7* 7.9*  MG 1.9  --   --   --   --   PHOS 3.0 2.7  --   --   --    GFR Estimated Creatinine Clearance: 61.1 mL/min (by C-G formula based on Cr of 0.65). Liver Function Tests:  Recent Labs Lab 11/04/14 0718 11/05/14 0549  AST 164* 78*  ALT 159* 107*  ALKPHOS 311* 260*  BILITOT 1.1 0.7  PROT 6.2* 6.4*  ALBUMIN 2.2* 1.9*   CBC:  Recent Labs Lab 10/31/14 0528 11/01/14 0617 11/04/14 0718 11/05/14 0549 11/06/14 0730  WBC 14.3* 14.8* 13.1* 13.6* 15.1*  HGB 9.2* 9.2* 8.7* 8.3* 8.7*  HCT 28.3* 28.9* 26.9* 26.2* 26.6*  MCV 79.7 80.5 79.6 80.4 79.9  PLT 243 238 311 369 435*   CBG:  Recent Labs Lab 11/01/14 1131 11/01/14 1735 11/02/14 0032 11/02/14 0644 11/02/14 1316  GLUCAP 123* 118* 119* 123* 96   Microbiology No results found for this or any previous visit (from the past 240 hour(s)).   Medications:   . antiseptic oral rinse  7 mL Mouth Rinse q12n4p  . atorvastatin  40 mg Oral QPM  . carvedilol  6.25 mg Oral BID WC  . chlorhexidine  15 mL Mouth Rinse BID  . dicyclomine  20 mg Oral QHS  . escitalopram  5 mg Oral Daily  . feeding supplement  1 Container Oral TID BM  . feeding supplement (ENSURE ENLIVE)  237 mL Oral BID  BM  . folic acid  1 mg Oral Daily  . loperamide  2 mg Oral BID  . megestrol  40 mg Oral Daily  . mirtazapine  30 mg Oral QHS  . sodium chloride  10-40 mL Intracatheter Q12H  . thiamine  100 mg Oral Daily   Continuous Infusions: . dextrose 5 % and 0.9 % NaCl with KCl 20 mEq/L 75 mL/hr at 11/06/14 0429    Time spent: 25 minutes.   LOS: 15 days   Rodney [name]  [facility] Pager [phone].  *Please refer to [email or web address], password [email or web address] to get updated schedule on who will round on this patient, as hospitalists switch teams weekly. If 7PM-7AM, please contact night-coverage at [email or web address], password [email or web address] for any overnight needs.  11/06/2014, 12:01 PM

## 2014-11-07 LAB — COMPREHENSIVE METABOLIC PANEL
ALBUMIN: 2.2 g/dL — AB (ref 3.5–5.0)
ALK PHOS: 240 U/L — AB (ref 38–126)
ALT: 78 U/L — ABNORMAL HIGH (ref 17–63)
AST: 69 U/L — ABNORMAL HIGH (ref 15–41)
Anion gap: 8 (ref 5–15)
BILIRUBIN TOTAL: 0.3 mg/dL (ref 0.3–1.2)
BUN: 11 mg/dL (ref 6–20)
CHLORIDE: 111 mmol/L (ref 101–111)
CO2: 21 mmol/L — AB (ref 22–32)
CREATININE: 0.6 mg/dL — AB (ref 0.61–1.24)
Calcium: 8.2 mg/dL — ABNORMAL LOW (ref 8.9–10.3)
Glucose, Bld: 130 mg/dL — ABNORMAL HIGH (ref 65–99)
Potassium: 3.9 mmol/L (ref 3.5–5.1)
SODIUM: 140 mmol/L (ref 135–145)
Total Protein: 6.8 g/dL (ref 6.5–8.1)

## 2014-11-07 LAB — GLUCOSE, CAPILLARY
Glucose-Capillary: 106 mg/dL — ABNORMAL HIGH (ref 65–99)
Glucose-Capillary: 107 mg/dL — ABNORMAL HIGH (ref 65–99)
Glucose-Capillary: 116 mg/dL — ABNORMAL HIGH (ref 65–99)
Glucose-Capillary: 117 mg/dL — ABNORMAL HIGH (ref 65–99)

## 2014-11-07 LAB — DIFFERENTIAL
BASOS PCT: 0 % (ref 0–1)
Basophils Absolute: 0 10*3/uL (ref 0.0–0.1)
EOS ABS: 0.2 10*3/uL (ref 0.0–0.7)
Eosinophils Relative: 2 % (ref 0–5)
LYMPHS ABS: 2.1 10*3/uL (ref 0.7–4.0)
Lymphocytes Relative: 15 % (ref 12–46)
Monocytes Absolute: 1 10*3/uL (ref 0.1–1.0)
Monocytes Relative: 7 % (ref 3–12)
NEUTROS PCT: 76 % (ref 43–77)
Neutro Abs: 10.3 10*3/uL — ABNORMAL HIGH (ref 1.7–7.7)

## 2014-11-07 LAB — CBC
HCT: 26.4 % — ABNORMAL LOW (ref 39.0–52.0)
Hemoglobin: 8.5 g/dL — ABNORMAL LOW (ref 13.0–17.0)
MCH: 25.8 pg — AB (ref 26.0–34.0)
MCHC: 32.2 g/dL (ref 30.0–36.0)
MCV: 80 fL (ref 78.0–100.0)
Platelets: 391 10*3/uL (ref 150–400)
RBC: 3.3 MIL/uL — ABNORMAL LOW (ref 4.22–5.81)
RDW: 19.2 % — ABNORMAL HIGH (ref 11.5–15.5)
WBC: 13.7 10*3/uL — ABNORMAL HIGH (ref 4.0–10.5)

## 2014-11-07 LAB — MAGNESIUM: MAGNESIUM: 2.1 mg/dL (ref 1.7–2.4)

## 2014-11-07 LAB — PHOSPHORUS: Phosphorus: 3.1 mg/dL (ref 2.5–4.6)

## 2014-11-07 LAB — TRIGLYCERIDES: TRIGLYCERIDES: 175 mg/dL — AB (ref <150)

## 2014-11-07 MED ORDER — CLINIMIX E/DEXTROSE (5/15) 5 % IV SOLN
INTRAVENOUS | Status: AC
  Administered 2014-11-07: 18:00:00 via INTRAVENOUS
  Filled 2014-11-07: qty 960

## 2014-11-07 MED ORDER — ADULT MULTIVITAMIN W/MINERALS CH
1.0000 | ORAL_TABLET | Freq: Every day | ORAL | Status: DC
  Administered 2014-11-08 – 2014-11-10 (×3): 1 via ORAL
  Filled 2014-11-07 (×4): qty 1

## 2014-11-07 MED ORDER — FAT EMULSION 20 % IV EMUL
192.0000 mL | INTRAVENOUS | Status: AC
  Administered 2014-11-07: 192 mL via INTRAVENOUS
  Filled 2014-11-07: qty 250

## 2014-11-07 NOTE — Progress Notes (Signed)
Patient ID: Rodney [name] Reason, male   DOB: 09/19/1945, 69 y.o.   MRN: [id number]  [facility]  POD#: 11  Subjective: Patient in bed, appears comfortable.  Answers yes and no to questions.  Taking some regular diet - calorie counts ongoing.  Objective: Vital signs in last 24 hours: Temp:  [98.5 F (36.9 C)-98.9 F (37.2 C)] 98.9 F (37.2 C) (08/06 0514) Pulse Rate:  [82-88] 82 (08/06 0514) Resp:  [16-18] 16 (08/06 0514) BP: (99-113)/(48-57) 100/48 mmHg (08/06 0514) SpO2:  [98 %-100 %] 100 % (08/06 0514) Weight:  [50.077 kg (110 lb 6.4 oz)] 50.077 kg (110 lb 6.4 oz) (08/06 0514) Last BM Date: 11/08/14  Intake/Output from previous day: 2022-11-08 0701 - 08/06 0700 In: 315 [P.O.:295; I.V.:20] Out: 1725 [Urine:425; Emesis/NG output:200; Stool:1100] Intake/Output this shift: Total I/O In: -  Out: 350 [Stool:350]  Physical Exam: HEENT - sclerae clear, mucous membranes moist Neck - soft Chest - clear bilaterally Cor - RRR Abdomen - protuberant, dressing dry; gas in ostomy bag  Lab Results:   Recent Labs  2014/11/08 0730 11/07/14 0413  WBC 15.1* 13.7*  HGB 8.7* 8.5*  HCT 26.6* 26.4*  PLT 435* 391   BMET  Recent Labs  11-08-14 0730 11/07/14 0413  NA 135 140  K 3.9 3.9  CL 109 111  CO2 23 21*  GLUCOSE 121* 130*  BUN 11 11  CREATININE 0.65 0.60*  CALCIUM 7.9* 8.2*   PT/INR No results for input(s): LABPROT, INR in the last 72 hours. Comprehensive Metabolic Panel:    Component Value Date/Time   NA 140 11/07/2014 0413   NA 135 11-08-14 0730   K 3.9 11/07/2014 0413   K 3.9 08-Nov-2014 0730   CL 111 11/07/2014 0413   CL 109 November 08, 2014 0730   CO2 21* 11/07/2014 0413   CO2 23 11/08/14 0730   BUN 11 11/07/2014 0413   BUN 11 2014/11/08 0730   CREATININE 0.60* 11/07/2014 0413   CREATININE 0.65 11-08-2014 0730   GLUCOSE 130* 11/07/2014 0413   GLUCOSE 121* 2014/11/08 0730   CALCIUM 8.2* 11/07/2014 0413   CALCIUM 7.9* 2014/11/08 0730    AST 69* 11/07/2014 0413   AST 78* 11/05/2014 0549   ALT 78* 11/07/2014 0413   ALT 107* 11/05/2014 0549   ALKPHOS 240* 11/07/2014 0413   ALKPHOS 260* 11/05/2014 0549   BILITOT 0.3 11/07/2014 0413   BILITOT 0.7 11/05/2014 0549   PROT 6.8 11/07/2014 0413   PROT 6.4* 11/05/2014 0549   ALBUMIN 2.2* 11/07/2014 0413   ALBUMIN 1.9* 11/05/2014 0549    Studies/Results: Dg Abd 2 Views  2014-11-08   CLINICAL DATA:  Post ileostomy and right hemicolectomy. Increased distention and abdominal pain.  EXAM: ABDOMEN - 2 VIEW  COMPARISON:  CT 10/22/2014  FINDINGS: Gaseous distension of bowel, predominantly small bowel with air-fluid levels. Cannot exclude small bowel obstruction. No free air. Prior cholecystectomy.  IMPRESSION: Gaseous distention of bowel, predominantly small bowel. Cannot exclude small bowel obstruction.   Electronically Signed   By: Rolm Baptise M.D.   On: 11/08/2014 14:14    Anti-infectives: Anti-infectives    Start     Dose/Rate Route Frequency Ordered Stop   10/27/14 1630  metroNIDAZOLE (FLAGYL) IVPB 500 mg     500 mg 100 mL/hr over 60 Minutes Intravenous 3 times daily 10/27/14 1559 10/27/14 1743   10/27/14 0915  [MAR Hold]  metroNIDAZOLE (FLAGYL) IVPB 500 mg     (MAR Hold since 10/27/14 1122)  500 mg 100 mL/hr over 60 Minutes Intravenous 30 min pre-op 10/27/14 0837 10/27/14 1328   10/23/14 2200  cefTRIAXone (ROCEPHIN) 1 g in dextrose 5 % 50 mL IVPB  Status:  Discontinued     1 g 100 mL/hr over 30 Minutes Intravenous Every 24 hours 10/22/14 2121 10/28/14 0816   10/22/14 2030  cefTRIAXone (ROCEPHIN) 1 g in dextrose 5 % 50 mL IVPB     1 g 100 mL/hr over 30 Minutes Intravenous  Once 10/22/14 2017 10/22/14 2145      Assessment & Plans: Obstructing colon cancer with peritoneal carcinomatosis Diagnostic laparoscopy, right hemicolectomy, end ileostomy, mucous fistula, cholecystectomy,  10/27/14, Dr. Barry Dienes  Post op ileus PVOD/bilateral AKA Hypertension Hx of tobacco ongoing  use ETOH use Hx of CHF Malnutrition - TNA restarted pending adequate po intake  Rodney Regal, MD, [facility] Office: [place] 11/07/2014

## 2014-11-07 NOTE — Progress Notes (Signed)
PARENTERAL NUTRITION CONSULT NOTE - FOLLOW UP  Pharmacy Consult for TPN Indication: inadequate PO intake  No Known Allergies  Patient Measurements: Height: 5\' 11"  (180.3 cm) Weight: 110 lb 6.4 oz (50.077 kg) IBW/kg (Calculated) : 75.3 IBW calculated based on height of 5'11". Current total body weight much lower than IBW due to bilateral AKAs.  Vital Signs: Temp: 98.9 F (37.2 C) (08/06 0514) Temp Source: Oral (08/06 0514) BP: 100/48 mmHg (08/06 0514) Pulse Rate: 82 (08/06 0514) Intake/Output from previous day: 08/05 0701 - 08/06 0700 In: 315 [P.O.:295; I.V.:20] Out: 1725 [Urine:425; Emesis/NG output:200; KPTWS:5681] Intake/Output from this shift: Total I/O In: -  Out: 350 [Stool:350]  Labs:  Recent Labs  11/05/14 0549 11/06/14 0730 11/07/14 0413  WBC 13.6* 15.1* 13.7*  HGB 8.3* 8.7* 8.5*  HCT 26.2* 26.6* 26.4*  PLT 369 435* 391     Recent Labs  11/05/14 0549 11/06/14 0730 11/07/14 0413  NA 141 135 140  K 4.1 3.9 3.9  CL 112* 109 111  CO2 23 23 21*  GLUCOSE 103* 121* 130*  BUN 13 11 11   CREATININE 0.66 0.65 0.60*  CALCIUM 7.7* 7.9* 8.2*  MG  --   --  2.1  PHOS  --   --  3.1  PROT 6.4*  --  6.8  ALBUMIN 1.9*  --  2.2*  AST 78*  --  69*  ALT 107*  --  78*  ALKPHOS 260*  --  240*  BILITOT 0.7  --  0.3  PREALBUMIN  --  5.9*  --   TRIG  --   --  175*   Estimated Creatinine Clearance: 60.9 mL/min (by C-G formula based on Cr of 0.6).    Recent Labs  11/07/14 0016 11/07/14 0749  GLUCAP 116* 117*    Medications:  Infusions:  . dextrose 5 % and 0.9 % NaCl with KCl 20 mEq/L 35 mL/hr at 11/06/14 1900  . Marland KitchenTPN (CLINIMIX-E) Adult 40 mL/hr at 11/06/14 2027   And  . fat emulsion 250 mL (11/06/14 2028)    Insulin Requirements: None  Current Nutrition:  Diet: Regular Supplements: Boost/Resource Breese TID, Ensure Enlive BID  Calorie count in progress by RD 8/4: 514 kcal (47% min needs), 12g protein (21.5% min needs)  IVF: D5 NS + KCl 20 mEq/L @  75 ml/min  Central access: PICC 7/28 TPN start date: 7/28 - 8/1, 8/5   ASSESSMENT                                                                                                          HPI: 69 yo male, bilateral AK amputee, admitted 10/22/14 with abdominal pain. CT of the abdomen and pelvis showed colonic mass revealed by pathology as adenocarcinoma.  On 10/27/14, he underwent a palliative right hemicolectomy, end ileostomy, mucous fistula and cholecystectomy. Pharmacy was initially consulted 7/28 to begin TPN for postop ileus.  Ostomy began functioning and he began eating a regular diet on 7/31; TPN was d/c.  His ostomy appears to be functioning (air and liquid in bag),  but he is not eating much.  RD has started a calorie count.  Pharmacy was consulted to resume TPN on 8/5.  Significant events:  7/26 palliative right hemicolectomy, end ileostomy, mucous fistula and cholecystectomy 7/28 TPN started 7/31: diet advanced to regular, per MD d/c TPN 8/5 TPN resumed for poor oral intake.  Today, 11/07/2014:   Glucose: at goal < 150  Electrolytes:  WNL  Renal:  SCr 0.6, stable  LFTs:  Elevated but improving  TGs:  158 (7/29), 175 (8/6)  Prealbumin:  4.1 (7/29), 5.9 (8/5)   No diet documented yesterday.  Resource Breeze charted given twice and Ensure documented as given once yesterday totaling ~38g protein and 850 kcal if patient finished the entirety of the supplements.  Patient refused supplements and breakfast this morning due to episode of nausea and vomiting.  NUTRITIONAL GOALS                                                                                            RD recs (8/4): 1100-1300 kcal, 55-65 g protein per day Clinimix 5/15 at a goal rate of 50 ml/hr + 20% fat emulsion at 21ml/hr to provide: 60g/day protein, 1332Kcal/day.  PLAN                                                                                                                         At 1800 today:  Continue  Clinimix E 5/15 at 40 ml/hr.  Will not advance further today since TPN is currently be used as a supplement to the oral intake of diet plus feeding supplements.  TPN alone (E 5/15 at 40 ml/hr) already providing 87% of protein needs and 97% of kcal needs.  Will reduce rate further as patient's oral intake improves.  20% fat emulsion at 8 ml/hr.  MVI tablet daily.  Removing MVI and trace elements from TPN.  Continue IVF at 35 ml/hr.  Continue CBGs and sensitive SSI q8h.  TPN lab panels on Mondays & Thursdays.  F/u daily.   Hershal Coria, PharmD, BCPS Pager: [phone] 11/07/2014 11:16 AM

## 2014-11-07 NOTE — Progress Notes (Signed)
Progress Note   Rodney [name] BMW:[id number] DOB: 12-09-1945 DOA: 10/22/2014 PCP: [name], NP   Brief Narrative:   Rodney [name] is an 69 y.o. male with a PMH of hypertension, alcohol abuse, PVD and CHF who was admitted 10/22/14 with chief complaint of abdominal pain. Workup in the ED included a CT of the abdomen and pelvis which showed a distal transverse colonic mass with high-grade stenosis concerning for malignancy. He initially refused surgical intervention and was felt to have capacity after a psychiatric evaluation. He ultimately consented to surgery and underwent a laparoscopy with right hemicolectomy, end ileostomy, mucous fistula and cholecystectomy on 10/27/14 for palliation. The patient is currently on TNA slow recovery of bowel function. Plan is to discharge him to a SNF when cleared by surgery.  Assessment/Plan:   Principal Problem:   Colorectal adenocarcinoma with widespread metastasis - Initial CT showed a transverse colonic mass with liver, spleen, and intra-abdominal metastasis. - S/P palliative laparoscopy with right hemicolectomy, end ileostomy, mucous fistula and cholecystectomy on 10/27/14. - Pathology consistent with colorectal adenocarcinoma. [name] need outpatient oncology follow-up once healed from surgery, although likely a poor candidate for treatment. - currently afebrile. Monitor wound and WBC. -off PPN, tolerating diet/wound/ostomy management per surgery -intake and output not accurately documented, will f/u surgery recommendations -tpn restarted per surgery  Active Problems:   Abdominal aortic aneurysm - Noted incidentally on CT scan. 2.6 cm.    HTN (hypertension) - has been on coreg, bp low normal on 8/5, d/c coreg, close monitor bp     CAD (coronary artery disease), multivessel - Continue statin. Continue Coreg. - Resume aspirin when okay with surgery.    Chronic systolic CHF (congestive heart failure), NYHA class 4 - Status post 2-D echo  on 10/25/14: EF 50%. Hypokinesis of inferolateral wall and basal inferior segment noted. LVH. - LV function markedly improved over prior echocardiogram. Improvement attributed to abstaining from alcohol.    Chronic microcytic anemia / acute blood loss anemia - Likely from acute postoperative blood loss in the setting of chronic GI blood loss.  - Hemoglobin remained stable at 9.2 after receiving 1 unit of PRBCs 10/30/14.    Hypokalemia - Repleted.    Malnutrition of moderate degree/hypophosphatemia - Evaluated by dietitian 10/23/14. Continue supplements. - off PPN, tolerating diet - Monitor for refeeding syndrome. Phosphorus WNL .    Escherichia coli urinary tract infection - Completed a course of therapy with Rocephin 7 days.    Peripheral vascular disease - Status post bilateral AKAs.  FTT: overall poor prognosis due to metastatic colorectal cancer, patient's has poor insight , patient does not seem to understand cancer diagnosis, psych has seen patient determined patient has capacity to make decision.     DVT Prophylaxis - Lovenox when okay with surgery. Unable to do SCDs given bilateral AKAs.  Family Communication: No family at the bedside. Declines my offer to call. Disposition Plan: SNF when ok with surgery Code Status:     Code Status Orders        Start     Ordered   10/22/14 2150  Full code   Continuous     10/22/14 2149        IV Access:    Peripheral IV   Procedures and diagnostic studies:   Ct Abdomen Pelvis W Contrast  10/22/2014   CLINICAL DATA:  69 year old male with right lower quadrant and periumbilical pain. Emesis  EXAM: CT ABDOMEN AND PELVIS WITH CONTRAST  TECHNIQUE: Multidetector  CT imaging of the abdomen and pelvis was performed using the standard protocol following bolus administration of intravenous contrast.  CONTRAST:  70mL OMNIPAQUE IOHEXOL 300 MG/ML SOLN, 124mL OMNIPAQUE IOHEXOL 300 MG/ML SOLN  COMPARISON:  CT dated 08/21/2013  FINDINGS:  Partially visualized small right pleural profusion. Top-normal cardiac size. Right cardiophrenic angle top-normal lymph nodes. No intra-abdominal free air. Small ascites.  There are innumerable hepatic hypodense lesions, increased from prior study. Some lesion such as the 3.8 x 4.5 cm hypodense lesion in the right lobe the liver adjacent the right hepatic vein likely represents a hemangioma and others were previously characterized as cysts. Other lesions such as a 2.4 x 3.4 cm subcapsular lesion seen in the right hepatic lobe posteriorly (series 2, image 19) are not well characterized but suspicious for metastatic disease. There are nodular implants predominantly involving the surface of the right lobe of the liver with scalloping of the liver parenchyma compatible with subcapsular implants.  The gallbladder is not visualized, likely surgically absent. The pancreas is unremarkable. Surgical clips noted adjacent the head of the pancreas. Multiple hypodense lesions noted predominantly involving the surface of the spleen concerning for metastatic implant. Left adrenal thickening/hyperplasia. There are bilateral renal cortical irregularity is. There is apparent diffuse thickening of the bladder wall which may be partly related to underdistention. Cystitis is not excluded. Correlation with urinalysis recommended. The prostate and seminal vesicles are grossly unremarkable.  There is an ill-defined irregular nodular mass measuring approximately 3.5 x 2.3 cm involving the distal transverse colon most compatible with malignancy. There is high-grade stenosis of the bowel with dilatation of the colon proximal to this mass. There is no evidence of small bowel obstruction. Scattered sigmoid diverticula without active inflammation.  There is aortoiliac atherosclerotic disease. A 2.6 cm infrarenal abdominal aortic ectasia. 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  There are nodular implants in the left lower abdomen with a combined dimension 1.9 x 4.7 cm. There is a 3.7 x 2.0 cm soft tissue implant is noted in the right lower quadrant adjacent the appendix. Multiple nodular implants are also noted adjacent to the tip of the appendix. Two 2 adjacent implants and free fluid extending from the subhepatic area. However, No definite evidence of acute appendicitis noted.  Multiple nodular densities anterior and inferior to the spleen compatible with metastatic implants.  Retroperitoneal/right periaortic adenopathy measures 1.3 cm in short axis.  Right anterior upper thigh lipoma noted with there is degenerative changes of the spine. No acute fracture.  IMPRESSION: Distal transverse colon obstructing mass most compatible with malignancy. Further evaluation with colonoscopy and tissue sampling recommended. There is high-grade stenosis and dilatation of the colon proximal to this mass.  Peritoneal, mesenteric, and hepatic metastatic disease.  No definite CT evidence of acute appendicitis.   Electronically Signed   By: Anner Crete M.D.   On: 10/22/2014 19:59   Dg Abd 2 Views  11/06/2014   CLINICAL DATA:  Post ileostomy and right hemicolectomy. Increased distention and abdominal pain.  EXAM: ABDOMEN - 2 VIEW  COMPARISON:  CT 10/22/2014  FINDINGS: Gaseous distension of bowel, predominantly small bowel with air-fluid levels. Cannot exclude small bowel obstruction. No free air. Prior cholecystectomy.  IMPRESSION: Gaseous distention of bowel, predominantly small bowel.  Cannot exclude small bowel obstruction.   Electronically Signed   By: Rolm Baptise M.D.   On: 11/06/2014 14:14     Medical Consultants:    Psychiatry  General surgery  GI  Cardiology  Anti-Infectives:     Ceftriaxone 10/22/2014> 10/28/14  Subjective:   Rodney [name] Reported feeling better, poor insight about cancer diagnosis. Say "whatever" to most of the questions asked. Reported vomited once, not sure if reliable  Objective:    Filed Vitals:   11/06/14 0606 11/06/14 1302 11/06/14 2052 11/07/14 0514  BP:  99/50 113/57 100/48  Pulse:  88 86 82  Temp:  98.9 F (37.2 C) 98.5 F (36.9 C) 98.9 F (37.2 C)  TempSrc:  Oral Oral Oral  Resp:  16 18 16   Height:      Weight: 50.349 kg (111 lb)   50.077 kg (110 lb 6.4 oz)  SpO2:  98% 99% 100%    Intake/Output Summary (Last 24 hours) at 11/07/14 1304 Last data filed at 11/07/14 0835  Gross per 24 hour  Intake    195 ml  Output   1625 ml  Net  -1430 ml    Exam: Gen:  NAD, flat affect, replies " whatever" to most of the questions. Cardiovascular:  RRR, II/VI SEM Respiratory:  Lungs CTAB Gastrointestinal:  Abdomen soft, +BS, ostomy draining, post op changes dressing intact, no visible drainage Extremities:  Bilateral amputee   Data Reviewed:    Labs: Basic Metabolic Panel:  Recent Labs Lab 11/01/14 0617 11/04/14 0718 11/05/14 0549 11/06/14 0730 11/07/14 0413  NA 137 138 141 135 140  K 4.3 4.0 4.1 3.9 3.9  CL 108 110 112* 109 111  CO2 23 23 23 23  21*  GLUCOSE 139* 95 103* 121* 130*  BUN 10 11 13 11 11   CREATININE 0.61 0.58* 0.66 0.65 0.60*  CALCIUM 8.0* 7.3* 7.7* 7.9* 8.2*  MG  --   --   --   --  2.1  PHOS 2.7  --   --   --  3.1   GFR Estimated Creatinine Clearance: 60.9 mL/min (by C-G formula based on Cr of 0.6). Liver Function Tests:  Recent Labs Lab 11/04/14 0718 11/05/14 0549 11/07/14 0413  AST 164* 78* 69*  ALT 159* 107* 78*  ALKPHOS 311* 260* 240*  BILITOT 1.1 0.7 0.3  PROT 6.2* 6.4* 6.8  ALBUMIN 2.2* 1.9* 2.2*   CBC:  Recent Labs Lab 11/01/14 0617 11/04/14 0718 11/05/14 0549 11/06/14 0730 11/07/14 0413  WBC 14.8* 13.1* 13.6* 15.1* 13.7*  NEUTROABS  --   --   --   --  10.3*  HGB  9.2* 8.7* 8.3* 8.7* 8.5*  HCT 28.9* 26.9* 26.2* 26.6* 26.4*  MCV 80.5 79.6 80.4 79.9 80.0  PLT 238 311 369 435* 391   CBG:  Recent Labs Lab 11/02/14 0032 11/02/14 0644 11/02/14 1316 11/07/14 0016 11/07/14 0749  GLUCAP 119* 123* 96 116* 117*   Microbiology No results found for this or any previous visit (from the past 240 hour(s)).   Medications:   . antiseptic oral rinse  7 mL Mouth Rinse q12n4p  . atorvastatin  40 mg Oral QPM  . chlorhexidine  15 mL Mouth Rinse BID  . dicyclomine  20 mg Oral QHS  . escitalopram  5 mg Oral Daily  . feeding supplement  1 Container Oral TID BM  . feeding supplement (ENSURE ENLIVE)  237 mL Oral BID BM  . folic acid  1 mg Oral Daily  .  insulin aspart  0-9 Units Subcutaneous Q8H  . loperamide  2 mg Oral BID  . megestrol  40 mg Oral Daily  . mirtazapine  30 mg Oral QHS  . multivitamin with minerals  1 tablet Oral Daily  . sodium chloride  10-40 mL Intracatheter Q12H  . thiamine  100 mg Oral Daily   Continuous Infusions: . dextrose 5 % and 0.9 % NaCl with KCl 20 mEq/L 35 mL/hr at 11/06/14 1900  . Marland KitchenTPN (CLINIMIX-E) Adult     And  . fat emulsion    . Marland KitchenTPN (CLINIMIX-E) Adult 40 mL/hr at 11/06/14 2027   And  . fat emulsion 250 mL (11/06/14 2028)    Time spent: 25 minutes.   LOS: 16 days   Rodney [name]  [facility] Pager [phone].  *Please refer to [email or web address], password [email or web address] to get updated schedule on who will round on this patient, as hospitalists switch teams weekly. If 7PM-7AM, please contact night-coverage at [email or web address], password [email or web address] for any overnight needs.  11/07/2014, 1:04 PM

## 2014-11-07 NOTE — Progress Notes (Signed)
Physical Therapy Treatment Patient Details Name: Rodney [name] MRN: [id number] DOB: February 13, 1946 Today's Date: 11/07/2014    History of Present Illness 69 y.o. male with a PMH of hypertension, alcohol abuse, PVD, bil AKA, CHF who was admitted 10/22/14 with chief complaint of abdominal pain. Workup in the ED included a CT of the abdomen and pelvis which showed a distal transverse colonic mass with high-grade stenosis concerning for malignancy and pt underwent a laparoscopy with right hemicolectomy, end ileostomy, mucous fistula and cholecystectomy on 10/27/14     PT Comments    Pt with increased activity tolerance today.  Con't to work on sitting balance, transfers, and w/c mobility.  Pt verbalized understanding to use call bell when wanting to return to bed.  Nursing staff aware pt up in chair and is awaiting a hamburger.  Follow Up Recommendations  SNF     Equipment Recommendations  None recommended by PT    Recommendations for Other Services       Precautions / Restrictions Precautions Precautions: Fall Precaution Comments: bil AKA,  colostomy Restrictions Weight Bearing Restrictions: No    Mobility  Bed Mobility   Bed Mobility: Supine to Sit     Supine to sit: Min assist     General bed mobility comments: HOB elevated and needed A to get trunk fully upright  Transfers Overall transfer level: Needs assistance Equipment used: None Transfers: Lateral/Scoot Transfers          Lateral/Scoot Transfers: Min guard General transfer comment: min/guard with some re-positioning of the chair and to prevent frontward LOB  Ambulation/Gait                 Hotel manager mobility: Yes Wheelchair propulsion: Both upper extremities Wheelchair parts: Supervision/cueing Distance: 100 Wheelchair Assistance Details (indicate cue type and reason): Did well on straightaways with only minimal cueing with tight spaces in  rooma dn going backwards into space between bed and sink.   Modified Rankin (Stroke Patients Only)       Balance Overall balance assessment: Needs assistance Sitting-balance support: Bilateral upper extremity supported;Feet unsupported Sitting balance-Leahy Scale: Fair Sitting balance - Comments: fair static POOR dynamic                            Cognition Arousal/Alertness: Awake/alert Behavior During Therapy: WFL for tasks assessed/performed Overall Cognitive Status: Within Functional Limits for tasks assessed                      Exercises      General Comments        Pertinent Vitals/Pain Pain Assessment: Faces Faces Pain Scale: Hurts little more Pain Location: abd Pain Descriptors / Indicators: Grimacing Pain Intervention(s): Monitored during session;Repositioned;Limited activity within patient's tolerance    Home Living                      Prior Function            PT Goals (current goals can now be found in the care plan section) Acute Rehab PT Goals PT Goal Formulation: With patient Time For Goal Achievement: 11/20/14 Potential to Achieve Goals: Good Progress towards PT goals: Progressing toward goals    Frequency  Min 3X/week    PT Plan Current plan remains appropriate    Co-evaluation  End of Session   Activity Tolerance: Patient tolerated treatment well Patient left: in chair;with call bell/phone within reach     Time: 4166-0630 PT Time Calculation (min) (ACUTE ONLY): 19 min  Charges:  $Therapeutic Activity: 8-22 mins                    G Codes:      Ishia Tenorio LUBECK 11/07/2014, 3:43 PM

## 2014-11-08 LAB — GLUCOSE, CAPILLARY
Glucose-Capillary: 113 mg/dL — ABNORMAL HIGH (ref 65–99)
Glucose-Capillary: 118 mg/dL — ABNORMAL HIGH (ref 65–99)

## 2014-11-08 MED ORDER — MEGESTROL ACETATE 400 MG/10ML PO SUSP
400.0000 mg | Freq: Every day | ORAL | Status: DC
  Administered 2014-11-08 – 2014-11-09 (×2): 400 mg via ORAL
  Filled 2014-11-08 (×2): qty 10

## 2014-11-08 MED ORDER — FAT EMULSION 20 % IV EMUL
192.0000 mL | INTRAVENOUS | Status: DC
  Administered 2014-11-08: 192 mL via INTRAVENOUS
  Filled 2014-11-08: qty 250

## 2014-11-08 MED ORDER — CLINIMIX E/DEXTROSE (5/15) 5 % IV SOLN
INTRAVENOUS | Status: DC
  Administered 2014-11-08: 18:00:00 via INTRAVENOUS
  Filled 2014-11-08: qty 960

## 2014-11-08 NOTE — Progress Notes (Signed)
PARENTERAL NUTRITION CONSULT NOTE - FOLLOW UP  Pharmacy Consult for TPN Indication: inadequate PO intake  No Known Allergies  Patient Measurements: Height: 5\' 11"  (180.3 cm) Weight: 110 lb (49.896 kg) IBW/kg (Calculated) : 75.3 IBW calculated based on height of 5'11". Current total body weight much lower than IBW due to bilateral AKAs.  Vital Signs: Temp: 98.9 F (37.2 C) (08/07 0300) Temp Source: Oral (08/07 0300) BP: 124/44 mmHg (08/07 0300) Pulse Rate: 94 (08/07 0300) Intake/Output from previous day: 08/06 0701 - 08/07 0700 In: 3118.1 [I.V.:1264.6; TPN:1853.5] Out: 2075 [Urine:700; Stool:1375] Intake/Output from this shift:    Labs:  Recent Labs  11/06/14 0730 11/07/14 0413  WBC 15.1* 13.7*  HGB 8.7* 8.5*  HCT 26.6* 26.4*  PLT 435* 391     Recent Labs  11/06/14 0730 11/07/14 0413  NA 135 140  K 3.9 3.9  CL 109 111  CO2 23 21*  GLUCOSE 121* 130*  BUN 11 11  CREATININE 0.65 0.60*  CALCIUM 7.9* 8.2*  MG  --  2.1  PHOS  --  3.1  PROT  --  6.8  ALBUMIN  --  2.2*  AST  --  69*  ALT  --  78*  ALKPHOS  --  240*  BILITOT  --  0.3  PREALBUMIN 5.9*  --   TRIG  --  175*   Estimated Creatinine Clearance: 60.6 mL/min (by C-G formula based on Cr of 0.6).    Recent Labs  11/07/14 1629 11/07/14 2326 11/08/14 0733  GLUCAP 107* 106* 113*    Medications:  Infusions:  . dextrose 5 % and 0.9 % NaCl with KCl 20 mEq/L 35 mL/hr at 11/07/14 2158  . Marland KitchenTPN (CLINIMIX-E) Adult 40 mL/hr at 11/07/14 1742   And  . fat emulsion 192 mL (11/07/14 1742)    Insulin Requirements: None  Current Nutrition:  Diet: Regular Supplements: Boost/Resource Breese TID, Ensure Enlive BID  IVF: D5 NS + KCl 20 mEq/L @ 35 ml/min  Central access: PICC 7/28 TPN start date: 7/28 - 8/1, 8/5 -  ASSESSMENT                                                                                                          HPI: 68 yo male, bilateral AK amputee, admitted 10/22/14 with abdominal  pain. CT of the abdomen and pelvis showed colonic mass revealed by pathology as adenocarcinoma.  On 10/27/14, he underwent a palliative right hemicolectomy, end ileostomy, mucous fistula and cholecystectomy. Pharmacy was initially consulted 7/28 to begin TPN for postop ileus.  Ostomy began functioning and he began eating a regular diet on 7/31; TPN was d/c.  His ostomy appears to be functioning (air and liquid in bag), but he is not eating much.  RD has started a calorie count.  Pharmacy was consulted to resume TPN on 8/5.  Significant events:  7/26 palliative right hemicolectomy, end ileostomy, mucous fistula and cholecystectomy 7/28 TPN started 7/31: diet advanced to regular, per MD d/c TPN 8/5 TPN resumed for poor oral intake.  Today, 11/08/2014:  Glucose: at goal < 150  Electrolytes:  WNL  Renal:  SCr 0.6, stable  LFTs:  Elevated but improving  TGs:  158 (7/29), 175 (8/6)  Prealbumin:  4.1 (7/29), 5.9 (8/5)   No diet documented yesterday.  Patient refused supplements yesterday and breakfast this morning.  Difficult to get information from patient upon interviewing.  Did not answer my questions.  No episodes of nausea or vomiting reported by RN today.  Patient just seems to not have an appetite.  NUTRITIONAL GOALS                                                                                            RD recs (8/4): 1100-1300 kcal, 55-65 g protein per day Clinimix 5/15 at a goal rate of 50 ml/hr + 20% fat emulsion at 39ml/hr to provide: 60g/day protein, 1332Kcal/day.  PLAN                                                                                                                         At 1800 today:  Continue Clinimix E 5/15 at 40 ml/hr.  Will not advance further today since TPN is currently be used as a supplement to the oral intake of diet plus feeding supplements.  TPN alone (E 5/15 at 40 ml/hr) already providing 87% of protein needs and 97% of kcal needs.  May consider  reducing rate further to help stimulate appetite but would like to see some PO intake before doing so.  20% fat emulsion at 8 ml/hr.  MVI tablet daily.    Continue IVF at 35 ml/hr.  Continue CBGs and sensitive SSI q8h.  TPN lab panels on Mondays & Thursdays.  F/u daily.  Strongly recommend discontinuing TPN and feeding enterally.  Also, recommend increasing Megace to 400 mg daily for appetite stimulation.   Hershal Coria, PharmD, BCPS Pager: [phone] 11/08/2014 9:58 AM

## 2014-11-08 NOTE — Progress Notes (Signed)
Calorie Count Note   48 hour calorie count ordered. Day 2 results below.  Diet: Regular Supplements: Ensure Enlive BID, Boost Breeze BID  Breakfast 8/5: 0% Lunch 8/5: 0% Dinner 8/5: 0% Supplements: 1/2 Ensure, 175 kcal, 10g protein  Estimated Nutritional Needs:   Kcal: 1100-1300  Protein: 55-65 grams  Fluid: 1.7 L/day  Total intake: 175 kcal (16% of minimum estimated needs)  10g protein (18% of minimum estimated needs)  Nutrition Dx: Inadequate oral intake related to poor appetite as evidenced by meal completion < 25%.  Goal: Patient will meet greater than or equal to 90% of their needs  Intervention:  -Continue TPN per Pharmacy -Continue Ensure Enlive po BID, each supplement provides 350 kcal and 20 grams of protein -Continue Boost Breeze po TID, each supplement provides 250 kcal and 9 grams of protein -D/C Calorie Count -Encourage PO intake -RD to continue to monitor   Clayton Bibles, MS, RD, LDN Pager: [phone] After Hours Pager: [phone]

## 2014-11-08 NOTE — Progress Notes (Signed)
12 Days Post-Op  Subjective: No complaints  Objective: Vital signs in last 24 hours: Temp:  [98.5 F (36.9 C)-98.9 F (37.2 C)] 98.9 F (37.2 C) (08/07 0300) Pulse Rate:  [86-94] 94 (08/07 0300) Resp:  [16] 16 (08/06 2041) BP: (109-124)/(44-59) 124/44 mmHg (08/07 0300) SpO2:  [99 %-100 %] 99 % (08/07 0300) Weight:  [49.896 kg (110 lb)] 49.896 kg (110 lb) (08/07 0648) Last BM Date: 11/07/14  Intake/Output from previous day: 08/06 0701 - 08/07 0700 In: 3118.1 [I.V.:1264.6; TPN:1853.5] Out: 2075 [Urine:700; Stool:1375] Intake/Output this shift:    GI: wound clean and open, stoma functional, approp tender  Lab Results:   Recent Labs  2014-11-12 0730 11/07/14 0413  WBC 15.1* 13.7*  HGB 8.7* 8.5*  HCT 26.6* 26.4*  PLT 435* 391   BMET  Recent Labs  2014-11-12 0730 11/07/14 0413  NA 135 140  K 3.9 3.9  CL 109 111  CO2 23 21*  GLUCOSE 121* 130*  BUN 11 11  CREATININE 0.65 0.60*  CALCIUM 7.9* 8.2*   PT/INR No results for input(s): LABPROT, INR in the last 72 hours. ABG No results for input(s): PHART, HCO3 in the last 72 hours.  Invalid input(s): PCO2, PO2  Studies/Results: Dg Abd 2 Views  11/12/2014   CLINICAL DATA:  Post ileostomy and right hemicolectomy. Increased distention and abdominal pain.  EXAM: ABDOMEN - 2 VIEW  COMPARISON:  CT 10/22/2014  FINDINGS: Gaseous distension of bowel, predominantly small bowel with air-fluid levels. Cannot exclude small bowel obstruction. No free air. Prior cholecystectomy.  IMPRESSION: Gaseous distention of bowel, predominantly small bowel. Cannot exclude small bowel obstruction.   Electronically Signed   By: Rolm Baptise M.D.   On: 11-12-14 14:14    Anti-infectives: Anti-infectives    Start     Dose/Rate Route Frequency Ordered Stop   10/27/14 1630  metroNIDAZOLE (FLAGYL) IVPB 500 mg     500 mg 100 mL/hr over 60 Minutes Intravenous 3 times daily 10/27/14 1559 10/27/14 1743   10/27/14 0915  [MAR Hold]  metroNIDAZOLE  (FLAGYL) IVPB 500 mg     (MAR Hold since 10/27/14 1122)   500 mg 100 mL/hr over 60 Minutes Intravenous 30 min pre-op 10/27/14 0837 10/27/14 1328   10/23/14 2200  cefTRIAXone (ROCEPHIN) 1 g in dextrose 5 % 50 mL IVPB  Status:  Discontinued     1 g 100 mL/hr over 30 Minutes Intravenous Every 24 hours 10/22/14 2121 10/28/14 0816   10/22/14 2030  cefTRIAXone (ROCEPHIN) 1 g in dextrose 5 % 50 mL IVPB     1 g 100 mL/hr over 30 Minutes Intravenous  Once 10/22/14 2017 10/22/14 2145      Assessment/Plan: POD 12  Calorie count ongoing, really needs to be off tna if possible.  Could feed enterally if needed at this point his stoma is functioning Continue dressing changes  Rodney [name] 11/08/2014

## 2014-11-08 NOTE — Progress Notes (Signed)
Progress Note   Rodney [name] KGY:[id number] DOB: [birth year] DOA: 10/22/2014 PCP: [name], NP   Brief Narrative:   Rodney [name] is an 69 y.o. male with a PMH of hypertension, alcohol abuse, PVD and CHF who was admitted 10/22/14 with chief complaint of abdominal pain. Workup in the ED included a CT of the abdomen and pelvis which showed a distal transverse colonic mass with high-grade stenosis concerning for malignancy. He initially refused surgical intervention and was felt to have capacity after a psychiatric evaluation. He ultimately consented to surgery and underwent a laparoscopy with right hemicolectomy, end ileostomy, mucous fistula and cholecystectomy on 10/27/14 for palliation. The patient is currently on TNA slow recovery of bowel function. Plan is to discharge him to a SNF when cleared by surgery.  Assessment/Plan:   Principal Problem:   Colorectal adenocarcinoma with widespread metastasis - Initial CT showed a transverse colonic mass with liver, spleen, and intra-abdominal metastasis. - S/P palliative laparoscopy with right hemicolectomy, end ileostomy, mucous fistula and cholecystectomy on 10/27/14. - Pathology consistent with colorectal adenocarcinoma. [name] need outpatient oncology follow-up once healed from surgery, although likely a poor candidate for treatment. - currently afebrile. Monitor wound and WBC. -off PPN, tolerating diet/wound/ostomy management per surgery -intake and output not accurately documented, will f/u surgery recommendations -tpn restarted per surgery, will follow surgery rec's.  Active Problems:   Abdominal aortic aneurysm - Noted incidentally on CT scan. 2.6 cm.    HTN (hypertension) - has been on coreg, bp low normal on 8/5, d/c coreg, close monitor bp -bp stable after off coreg    CAD (coronary artery disease), multivessel - Continue statin. Continue Coreg. - Resume aspirin when okay with surgery.    Chronic systolic CHF (congestive  heart failure), NYHA class 4 - Status post 2-D echo on 10/25/14: EF 50%. Hypokinesis of inferolateral wall and basal inferior segment noted. LVH. - LV function markedly improved over prior echocardiogram. Improvement attributed to abstaining from alcohol.    Chronic microcytic anemia / acute blood loss anemia - Likely from acute postoperative blood loss in the setting of chronic GI blood loss.  - Hemoglobin remained stable at 9.2 after receiving 1 unit of PRBCs 10/30/14.    Hypokalemia - Repleted.    Malnutrition of moderate degree/hypophosphatemia - Evaluated by dietitian 10/23/14. Continue supplements. - off PPN, tolerating diet - Monitor for refeeding syndrome. Phosphorus WNL .    Escherichia coli urinary tract infection - Completed a course of therapy with Rocephin 7 days.    Peripheral vascular disease - Status post bilateral AKAs.  FTT: overall poor prognosis due to metastatic colorectal cancer, patient's has poor insight , patient does not seem to understand cancer diagnosis, psych has seen patient determined patient has capacity to make decision.     DVT Prophylaxis - Lovenox when okay with surgery. Unable to do SCDs given bilateral AKAs.  Family Communication: No family at the bedside. Declines my offer to call. Disposition Plan: SNF when ok with surgery Code Status:     Code Status Orders        Start     Ordered   10/22/14 2150  Full code   Continuous     10/22/14 2149        IV Access:    Peripheral IV   Procedures and diagnostic studies:   Ct Abdomen Pelvis W Contrast  10/22/2014   CLINICAL DATA:  69 year old male with right lower quadrant and periumbilical pain. Emesis  EXAM: CT  ABDOMEN AND PELVIS WITH CONTRAST  TECHNIQUE: Multidetector CT imaging of the abdomen and pelvis was performed using the standard protocol following bolus administration of intravenous contrast.  CONTRAST:  53mL OMNIPAQUE IOHEXOL 300 MG/ML SOLN, 186mL OMNIPAQUE IOHEXOL 300 MG/ML  SOLN  COMPARISON:  CT dated 08/21/2013  FINDINGS: Partially visualized small right pleural profusion. Top-normal cardiac size. Right cardiophrenic angle top-normal lymph nodes. No intra-abdominal free air. Small ascites.  There are innumerable hepatic hypodense lesions, increased from prior study. Some lesion such as the 3.8 x 4.5 cm hypodense lesion in the right lobe the liver adjacent the right hepatic vein likely represents a hemangioma and others were previously characterized as cysts. Other lesions such as a 2.4 x 3.4 cm subcapsular lesion seen in the right hepatic lobe posteriorly (series 2, image 19) are not well characterized but suspicious for metastatic disease. There are nodular implants predominantly involving the surface of the right lobe of the liver with scalloping of the liver parenchyma compatible with subcapsular implants.  The gallbladder is not visualized, likely surgically absent. The pancreas is unremarkable. Surgical clips noted adjacent the head of the pancreas. Multiple hypodense lesions noted predominantly involving the surface of the spleen concerning for metastatic implant. Left adrenal thickening/hyperplasia. There are bilateral renal cortical irregularity is. There is apparent diffuse thickening of the bladder wall which may be partly related to underdistention. Cystitis is not excluded. Correlation with urinalysis recommended. The prostate and seminal vesicles are grossly unremarkable.  There is an ill-defined irregular nodular mass measuring approximately 3.5 x 2.3 cm involving the distal transverse colon most compatible with malignancy. There is high-grade stenosis of the bowel with dilatation of the colon proximal to this mass. There is no evidence of small bowel obstruction. Scattered sigmoid diverticula without active inflammation.  There is aortoiliac atherosclerotic disease. A 2.6 cm infrarenal abdominal aortic ectasia. There are bilateral common iliac artery aneurysms measuring  up to 1.7 cm (previously 1.2 cm). 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  There are nodular implants in the left lower abdomen with a combined dimension 1.9 x 4.7 cm. There is a 3.7 x 2.0 cm soft tissue implant is noted in the right lower quadrant adjacent the appendix. Multiple nodular implants are also noted adjacent to the tip of the appendix. Two 2 adjacent implants and free fluid extending from the subhepatic area. However, No definite evidence of acute appendicitis noted.  Multiple nodular densities anterior and inferior to the spleen compatible with metastatic implants.  Retroperitoneal/right periaortic adenopathy measures 1.3 cm in short axis.  Right anterior upper thigh lipoma noted with there is degenerative changes of the spine. No acute fracture.  IMPRESSION: Distal transverse colon obstructing mass most compatible with malignancy. Further evaluation with colonoscopy and tissue sampling recommended. There is high-grade stenosis and dilatation of the colon proximal to this mass.  Peritoneal, mesenteric, and hepatic metastatic disease.  No definite CT evidence of acute appendicitis.   Electronically Signed   By: Anner Crete M.D.   On: 10/22/2014 19:59   Dg Abd 2 Views  11/06/2014   CLINICAL DATA:  Post ileostomy and right hemicolectomy. Increased distention and abdominal pain.  EXAM: ABDOMEN - 2 VIEW  COMPARISON:  CT 10/22/2014  FINDINGS: Gaseous distension of bowel, predominantly small bowel with air-fluid levels. Cannot exclude small bowel obstruction. No free air. Prior cholecystectomy.  IMPRESSION: Gaseous distention of bowel, predominantly small bowel. Cannot exclude small bowel obstruction.   Electronically Signed   By: Rolm Baptise M.D.   On: 11/06/2014 14:14     Medical Consultants:    Psychiatry  General  surgery  GI  Cardiology  Anti-Infectives:    Ceftriaxone 10/22/2014> 10/28/14  Subjective:   [name] Reported feeling better, say yes or no to questions, but otherwise not conversant much, poor insight about cancer diagnosis.  Denies ab pain, no documented vomiting, clear fluids in ostomy bag.   Objective:    Filed Vitals:   11/07/14 1409 11/07/14 2041 11/08/14 0300 11/08/14 0648  BP: 109/59 118/57 124/44   Pulse: 86 91 94   Temp: 98.5 F (36.9 C) 98.5 F (36.9 C) 98.9 F (37.2 C)   TempSrc: Oral Oral Oral   Resp: 16 16    Height:      Weight:    49.896 kg (110 lb)  SpO2: 100% 100% 99%     Intake/Output Summary (Last 24 hours) at 11/08/14 1016 Last data filed at 11/08/14 0527  Gross per 24 hour  Intake 3118.1 ml  Output   1525 ml  Net 1593.1 ml    Exam: Gen:  NAD, flat affect,  Cardiovascular:  RRR, II/VI SEM Respiratory:  Lungs CTAB Gastrointestinal:  Abdomen soft, +BS, ostomy draining, post op changes dressing intact, no visible drainage Extremities:  Bilateral amputee   Data Reviewed:    Labs: Basic Metabolic Panel:  Recent Labs Lab 11/04/14 0718 11/05/14 0549 11/06/14 0730 11/07/14 0413  NA 138 141 135 140  K 4.0 4.1 3.9 3.9  CL 110 112* 109 111  CO2 23 23 23  21*  GLUCOSE 95 103* 121* 130*  BUN 11 13 11 11   CREATININE 0.58* 0.66 0.65 0.60*  CALCIUM 7.3* 7.7* 7.9* 8.2*  MG  --   --   --  2.1  PHOS  --   --   --  3.1   GFR Estimated Creatinine Clearance: 60.6 mL/min (by C-G formula based on Cr of 0.6). Liver Function Tests:  Recent Labs Lab 11/04/14 0718 11/05/14 0549 11/07/14 0413  AST 164* 78* 69*  ALT 159* 107* 78*  ALKPHOS 311* 260* 240*  BILITOT 1.1 0.7 0.3  PROT 6.2* 6.4* 6.8  ALBUMIN 2.2* 1.9* 2.2*   CBC:  Recent Labs Lab 11/04/14 0718 11/05/14 0549 11/06/14 0730 11/07/14 0413  WBC 13.1* 13.6* 15.1* 13.7*  NEUTROABS  --   --   --  10.3*  HGB 8.7* 8.3* 8.7* 8.5*  HCT 26.9* 26.2* 26.6* 26.4*  MCV 79.6 80.4  79.9 80.0  PLT 311 369 435* 391   CBG:  Recent Labs Lab 11/07/14 0016 11/07/14 0749 11/07/14 1629 11/07/14 2326 11/08/14 0733  GLUCAP 116* 117* 107* 106* 113*   Microbiology No results found for this or any previous visit (from the past 240 hour(s)).   Medications:   . antiseptic oral rinse  7 mL Mouth Rinse q12n4p  . atorvastatin  40 mg Oral QPM  . chlorhexidine  15 mL Mouth Rinse BID  . dicyclomine  20 mg Oral QHS  . escitalopram  5 mg Oral Daily  . feeding supplement  1 Container Oral TID BM  . feeding supplement (ENSURE ENLIVE)  237 mL Oral BID BM  . folic acid  1 mg Oral Daily  . insulin aspart  0-9 Units Subcutaneous Q8H  . loperamide  2 mg Oral BID  . megestrol  40 mg Oral  Daily  . mirtazapine  30 mg Oral QHS  . multivitamin with minerals  1 tablet Oral Daily  . sodium chloride  10-40 mL Intracatheter Q12H  . thiamine  100 mg Oral Daily   Continuous Infusions: . dextrose 5 % and 0.9 % NaCl with KCl 20 mEq/L 35 mL/hr at 11/07/14 2158  . Marland KitchenTPN (CLINIMIX-E) Adult 40 mL/hr at 11/07/14 1742   And  . fat emulsion 192 mL (11/07/14 1742)    Time spent: 25 minutes.   LOS: 17 days   Meghanne Pletz  [facility] Pager [phone].  *Please refer to [email or web address], password [email or web address] to get updated schedule on who will round on this patient, as hospitalists switch teams weekly. If 7PM-7AM, please contact night-coverage at [email or web address], password [email or web address] for any overnight needs.  11/08/2014, 10:16 AM

## 2014-11-09 LAB — GLUCOSE, CAPILLARY
Glucose-Capillary: 115 mg/dL — ABNORMAL HIGH (ref 65–99)
Glucose-Capillary: 118 mg/dL — ABNORMAL HIGH (ref 65–99)

## 2014-11-09 MED ORDER — MEGESTROL ACETATE 400 MG/10ML PO SUSP
800.0000 mg | Freq: Every day | ORAL | Status: DC
  Administered 2014-11-10: 800 mg via ORAL
  Filled 2014-11-09: qty 20

## 2014-11-09 NOTE — Progress Notes (Signed)
CSW continuing to follow.   Pt plans for [facility] when pt medically ready for discharge.  Per MD, pt not yet medically ready for discharge today  CSW spoke with [facility] and sent facility updated clinical information.  CSW to continue to follow to provide support and assist with pt discharge to [facility] when pt medically ready for discharge.  Alison Murray, MSW, [place] [facility] [phone]

## 2014-11-09 NOTE — Care Management Important Message (Signed)
Important Message  Patient Details  Name: Rodney [name] MRN: [id number] Date of Birth: [birth year]   Medicare Important Message Given:  Yes-third notification given    Camillo Flaming 11/09/2014, 12:14 [place] Message  Patient Details  Name: Rodney [name] MRN: [id number] Date of Birth: 11-22-1945   Medicare Important Message Given:  Yes-third notification given    Camillo Flaming 11/09/2014, 12:13 PM

## 2014-11-09 NOTE — Progress Notes (Signed)
Patient ID: Rodney [name], male   DOB: 11/14/1945, 69 y.o.   MRN: [id number]  [facility]  POD#: 13  Subjective: Patient in bed, no complaints.  States he has no appetite.  Food tray and supplements at bedside.  Objective: Vital signs in last 24 hours: Temp:  [98.5 F (36.9 C)-99 F (37.2 C)] 99 F (37.2 C) (08/08 0626) Pulse Rate:  [88-93] 93 (08/08 0626) Resp:  [16-18] 18 (08/08 0626) BP: (100-120)/(54-63) 100/54 mmHg (08/08 0626) SpO2:  [98 %] 98 % (08/08 0626) Last BM Date: 11/09/14  Intake/Output from previous day: 08/07 0701 - 08/08 0700 In: 4926.6 [I.V.:857.2; UXN:2355.7] Out: 1905 [Urine:530; Stool:1375] Intake/Output this shift:    Physical Exam: HEENT - sclerae clear, mucous membranes moist Abdomen - protuberant, soft, non-tender; active output from ostomy, BS present; midline wound with fresh dressing in place, clean and dry  Lab Results:   Recent Labs  11/07/14 0413  WBC 13.7*  HGB 8.5*  HCT 26.4*  PLT 391   BMET  Recent Labs  11/07/14 0413  NA 140  K 3.9  CL 111  CO2 21*  GLUCOSE 130*  BUN 11  CREATININE 0.60*  CALCIUM 8.2*   PT/INR No results for input(s): LABPROT, INR in the last 72 hours. Comprehensive Metabolic Panel:    Component Value Date/Time   NA 140 11/07/2014 0413   NA 135 11/06/2014 0730   K 3.9 11/07/2014 0413   K 3.9 11/06/2014 0730   CL 111 11/07/2014 0413   CL 109 11/06/2014 0730   CO2 21* 11/07/2014 0413   CO2 23 11/06/2014 0730   BUN 11 11/07/2014 0413   BUN 11 11/06/2014 0730   CREATININE 0.60* 11/07/2014 0413   CREATININE 0.65 11/06/2014 0730   GLUCOSE 130* 11/07/2014 0413   GLUCOSE 121* 11/06/2014 0730   CALCIUM 8.2* 11/07/2014 0413   CALCIUM 7.9* 11/06/2014 0730   AST 69* 11/07/2014 0413   AST 78* 11/05/2014 0549   ALT 78* 11/07/2014 0413   ALT 107* 11/05/2014 0549   ALKPHOS 240* 11/07/2014 0413   ALKPHOS 260* 11/05/2014 0549   BILITOT 0.3 11/07/2014 0413   BILITOT  0.7 11/05/2014 0549   PROT 6.8 11/07/2014 0413   PROT 6.4* 11/05/2014 0549   ALBUMIN 2.2* 11/07/2014 0413   ALBUMIN 1.9* 11/05/2014 0549    Studies/Results: No results found.  Anti-infectives: Anti-infectives    Start     Dose/Rate Route Frequency Ordered Stop   10/27/14 1630  metroNIDAZOLE (FLAGYL) IVPB 500 mg     500 mg 100 mL/hr over 60 Minutes Intravenous 3 times daily 10/27/14 1559 10/27/14 1743   10/27/14 0915  [MAR Hold]  metroNIDAZOLE (FLAGYL) IVPB 500 mg     (MAR Hold since 10/27/14 1122)   500 mg 100 mL/hr over 60 Minutes Intravenous 30 min pre-op 10/27/14 0837 10/27/14 1328   10/23/14 2200  cefTRIAXone (ROCEPHIN) 1 g in dextrose 5 % 50 mL IVPB  Status:  Discontinued     1 g 100 mL/hr over 30 Minutes Intravenous Every 24 hours 10/22/14 2121 10/28/14 0816   10/22/14 2030  cefTRIAXone (ROCEPHIN) 1 g in dextrose 5 % 50 mL IVPB     1 g 100 mL/hr over 30 Minutes Intravenous  Once 10/22/14 2017 10/22/14 2145      Assessment & Plans: Obstructing colon cancer with peritoneal carcinomatosis Diagnostic laparoscopy, right hemicolectomy, end ileostomy, mucous fistula, cholecystectomy, 10/27/14, Dr. Barry Dienes Malnutrition - TNA restarted pending adequate po intake  Patient now with improving GI function.  Will discontinue TNA per pharmacy recommendations and encourage po intake of diet and supplements per nutritionist recommendations.  Continue wound care.  Begin planning for disposition per Care Management.  Will follow.  [name], MD, [facility] Office: [place] 11/09/2014

## 2014-11-09 NOTE — Progress Notes (Signed)
Nutrition Follow-up  DOCUMENTATION CODES:   Non-severe (moderate) malnutrition in context of chronic illness  INTERVENTION:   TPN per Pharmacy -Continue Ensure Enlive po BID, each supplement provides 350 kcal and 20 grams of protein -Continue Boost Breeze po TID, each supplement provides 250 kcal and 9 grams of protein -Encourage PO intake -RD to continue to monitor  NUTRITION DIAGNOSIS:   Inadequate oral intake related to poor appetite as evidenced by meal completion < 25%.  Ongoing.  GOAL:   Patient will meet greater than or equal to 90% of their needs  Not meeting.  MONITOR:   PO intake, Supplement acceptance, Labs, Weight trends, Skin, I & O's, Other (Comment) (TPN tolerance/weaning)  REASON FOR ASSESSMENT:   Consult Assessment of nutrition requirement/status (Calorie Count)  ASSESSMENT:   69 y.o. male with multiple medical problems presents with abdominal pain. Patient states that the pain started about 3 days ago and is mainly located in the right side lower quadrant. He states that he has had a poor appetite also. He states he has been losing weight. He has had some diarrhea. He admits to nausea and also some vomting. He states he has no blood in his stools. Patient has prior history of vascular disease and has had bilateral amputations. He states that he does smoke. He states that he has a history of ETOH use in the past no drinks in the past 2-3 years. In the ED he had a CT of the abdomen done and this shows presence of a colonic mass likely malignant.  Pt continues to not eat well. Per RN in interdisciplinary rounds, pt has been vomiting and refusing labs being drawn. Per pharmacy, TPN ordered to d/c after today. If pt continues to not eat well (<25%), then enteral nutrition should be considered.  Plan per Pharmacy:  Continue Clinimix E 5/15 at 40 ml/hr. Will not advance further today since TPN is currently be used as a supplement to the oral intake of diet  plus feeding supplements. TPN alone (E 5/15 at 40 ml/hr) already providing 87% of protein needs and 97% of kcal needs. May consider reducing rate further to help stimulate appetite but would like to see some PO intake before doing so.  20% fat emulsion at 8 ml/hr.  Strongly recommend discontinuing TPN and feeding enterally. Also, recommend increasing Megace to 400 mg daily for appetite stimulation.  Labs reviewed: CBGs: 115-118 Low Creatinine Mg/Phos WNL  Diet Order:  Diet regular Room service appropriate?: Yes; Fluid consistency:: Thin Diet - low sodium heart healthy  Skin:  Reviewed, no issues  Last BM:  8/8 -colostomy output: 100 ml  Height:   Ht Readings from Last 1 Encounters:  10/29/14 5\' 11"  (1.803 m)    Weight:   Wt Readings from Last 1 Encounters:  11/08/14 110 lb (49.896 kg)    Ideal Body Weight:  63.6 kg  BMI:  Body mass index is 15.35 kg/(m^2).  Estimated Nutritional Needs:   Kcal:  1100-1300  Protein:  55-65 grams  Fluid:  1.7 L/day  EDUCATION NEEDS:   No education needs identified at this time  Clayton Bibles, MS, RD, LDN Pager: [phone] After Hours Pager: [phone]

## 2014-11-09 NOTE — Progress Notes (Signed)
Pt refused to have labs drawn this AM. Vomited x1 this AM. Explained the importance of having labs drawn,to monitor his electrolytes. Pt appetite poor.Discharge planned for 8/9.

## 2014-11-09 NOTE — Consult Note (Signed)
WOC ostomy follow up Stoma type/location: RLQ ileostomy with mucus fistula Stomal assessment/size: 1 and 1/4 inch slightly oval stoma, red, moist.  Slightly budded at lateral side, slightly retracted at medial side (near incisional wound). Two staples removed today from well-healed areas within the parastomal field without incident. Peristomal assessment: Intact, clear Treatment options for stomal/peristomal skin: Skin barrier ring Output: Watery; light yellow effluent with few stool flecks Ostomy pouching: 2pc., 2 and 1/4 inch pouching system with skin barrier ring Education provided: Patient is instructed on the foods that will thicken effluent, e.g. The BRAT diet (bannanas, applesauce, rice and tapioca.  He tells me that he likes peanut butter and cheese which are also effective in effluent thickening. Enrolled patient in [place] Start Discharge program: Yes [place] nursing team will continue to follow, and will remain available to this patient, the nursing, surgical and medical teams.   Thanks, Maudie Flakes, MSN, RN, [place], [place], [place] [phone])

## 2014-11-09 NOTE — Progress Notes (Signed)
PT Cancellation Note  Patient Details Name: Kahle Mcqueen MRN: [id number] DOB: 1945-05-30   Cancelled Treatment:    Reason Eval/Treat Not Completed: Fatigue/lethargy limiting ability to participate (pt stated he'd been up OOB awhile and just returned to bed and wants to rest.  Will follow. )   Philomena Doheny 11/09/2014, 2:46 PM [phone]

## 2014-11-09 NOTE — Progress Notes (Signed)
[facility] PROGRESS NOTE  Keri Tavella OQH:[id number] DOB: 02-May-1945 DOA: 10/22/2014 PCP: [name], NP  HPI/Subjective: Rodney [name] does not endorse any overnight complaints or new issues. He is still experiencing nausea and vomiting with most recent episode this morning. Denies hematemesis. Appetite remains low. Mr. Dupler affect is flat with responses to questions as "yes" or "no" or "I'm all right." Eye contact is minimal. Patient denies depression or anxiety, when asked if he would like to speak with someone about recent diagnosis, patient denied.   Patient denies fever, chills, night sweats, chest pain, palpitations, shortness of breath, dyspnea, abdominal pain, melena, hematochezia, or urinary complaints.   Assessment/Plan: Principal Problem: Colorectal adenocarcinoma with widespread metastasis - Initial CT showed a transverse colonic mass with liver, spleen, and intra-abdominal metastasis. - S/P palliative laparoscopy with right hemicolectomy, end ileostomy, mucous fistula and cholecystectomy on 10/27/14. - Pathology consistent with colorectal adenocarcinoma. - He will need outpatient oncology follow-up once healed from surgery, although likely a poor candidate for treatment. - Patient endorses nausea, vomiting, and decrease in appetite.  - Patient remains afebrile. WBC on 11/07/14 was 13.7 down from 15.1. Ostomy is draining and wound is dressed with no apparent drainage.  - Surgery managing wound and ostomy - Patient was initially removed from TNA; however restarted on TPN per surgery due to poor PO intake.  - Dietician is following. Recommended to d/c TPN and encourage PO feeding. Will consult with pharmacy.  - Continue Boost and Ensure supplement.  - Megace dose increased. Continue zofran PRN for nausea and vomiting.  - Continue to monitor WBC and H/H with CBC and monitor wound.  - Check metabolic panel in am   Active Problems: Abdominal aortic aneurysm - Noted  incidentally on CT scan. 2.6 cm. - Patient with multiple risk factors: hypertension, hyperlipidemia, coronary artery disease, peripheral vascular disease male, and older than 65 years - Consider OP follow up.   HTN (hypertension) - OP medications were coreg and lasix. Coreg was d/c'd 8/5 due to low blood pressures.  - Some soft BPs 100s/50s.  - Continue lasix and monitor BPs  CAD (coronary artery disease), multivessel - Continue statin. - Resume aspirin when okay with surgery.  Chronic systolic CHF (congestive heart failure), NYHA class 4 - S/p 2-D echo on 10/25/14: EF 50%. Hypokinesis of inferolateral wall and basal inferior segment noted. LVH. - LV function markedly improved over prior echocardiogram. Improvement attributed to abstaining from alcohol. - Continue PO lasix, closely monitor for volume overload considering IVF and TPN.    Chronic anemia / acute blood loss anemia - Likely from acute postoperative blood loss in the setting of chronic GI blood loss.  - Patient received 1 unit PRBCs on 10/30/14. Hgb post transfusion was stable at 9.2.  - Hemoglobin on 11/07/14 was 8.5, hemoglobin ranging 8.3-8.7 since 11/04/14 - Patient denies hematochezia, hematemesis, bruising, or dizziness. - Check CBC in am, monitor for symptoms  Hypokalemia - Stable, with last potassium on 11/07/14 at 3.9  Moderate malnutrition/hypophosphatemia - Dietician consulted. - Patient maintains decreased appetite, nausea, and vomiting.  - Phosphorus WNL on 11/07/14. - Consult pharmacy regarding d/c of TPN per recommendations of dietician - Increase Megace to 400mg  daily to stimulate appetite  - Continue Ensure BID and Boost TID supplements. - Encourage PO intake - Continue to monitor for refeeding syndrome  Escherichia coli UTI - Completed 7 days Rocephin course on 10/28/14 - Patient denies dysuria or hematuria. He remains afebrile.  - Last WBC count on 11/07/14  showed 13.7 down from 15.1  Peripheral  vascular disease - Status post bilateral above knee amputations.  - Continue statin  Code Status: FULL Family Communication: no family is bedside Disposition Plan:  Anticipate discharge to SNF when cleared by surgery.   Consultants:  Surgery  GI  Cardiology  Nutrition management  Procedures:  Diagnostic laproscopy, with right hemicolectomy, end ileostomy, mucous fistula, and cholecystectomy 10/27/14  Antibiotics:  Cetriaxone, started 10/22/14, stopped 10/28/14  Metronidazole, started 10/27/14, stopped 10/27/14    Objective: Filed Vitals:   11/09/14 0626  BP: 100/54  Pulse: 93  Temp: 99 F (37.2 C)  Resp: 18    Intake/Output Summary (Last 24 hours) at 11/09/14 1215 Last data filed at 11/09/14 1638  Gross per 24 hour  Intake 4926.6 ml  Output   1705 ml  Net 3221.6 ml   Filed Weights   11/06/14 0606 11/07/14 0514 11/08/14 0648  Weight: 50.349 kg (111 lb) 50.077 kg (110 lb 6.4 oz) 49.896 kg (110 lb)    Exam:   General:  Mr. Burbach is a 69 year old male resting in bed watching tv. He is alert; however, affect is flat,responses to questions are brief, and eye contact is minimal. He appears in no acute distress.   Cardiovascular: Regular rate and rhythm. No murmurs, gallops, or rubs. 2+ radial pulses.   Respiratory: Good air movement. Very mild expiratory wheezing. No rhonchi or rales.   Abdomen: Ostomy bag draining, wound dressed with no visible drainage. Abdomen is mildly tender to palpation, positive bowel sounds.   Musculoskeletal: bilateral amputee, moves all four   Data Reviewed: Basic Metabolic Panel:  Recent Labs Lab 11/04/14 0718 11/05/14 0549 11/06/14 0730 11/07/14 0413  NA 138 141 135 140  K 4.0 4.1 3.9 3.9  CL 110 112* 109 111  CO2 23 23 23  21*  GLUCOSE 95 103* 121* 130*  BUN 11 13 11 11   CREATININE 0.58* 0.66 0.65 0.60*  CALCIUM 7.3* 7.7* 7.9* 8.2*  MG  --   --   --  2.1  PHOS  --   --   --  3.1   Liver Function Tests:  Recent  Labs Lab 11/04/14 0718 11/05/14 0549 11/07/14 0413  AST 164* 78* 69*  ALT 159* 107* 78*  ALKPHOS 311* 260* 240*  BILITOT 1.1 0.7 0.3  PROT 6.2* 6.4* 6.8  ALBUMIN 2.2* 1.9* 2.2*   No results for input(s): LIPASE, AMYLASE in the last 168 hours. No results for input(s): AMMONIA in the last 168 hours. CBC:  Recent Labs Lab 11/04/14 0718 11/05/14 0549 11/06/14 0730 11/07/14 0413  WBC 13.1* 13.6* 15.1* 13.7*  NEUTROABS  --   --   --  10.3*  HGB 8.7* 8.3* 8.7* 8.5*  HCT 26.9* 26.2* 26.6* 26.4*  MCV 79.6 80.4 79.9 80.0  PLT 311 369 435* 391   Cardiac Enzymes: No results for input(s): CKTOTAL, CKMB, CKMBINDEX, TROPONINI in the last 168 hours. BNP (last 3 results) No results for input(s): BNP in the last 8760 hours.  ProBNP (last 3 results) No results for input(s): PROBNP in the last 8760 hours.  CBG:  Recent Labs Lab 11/07/14 2326 11/08/14 0733 11/08/14 1631 11/08/14 2359 11/09/14 0736  GLUCAP 106* 113* 118* 115* 118*    No results found for this or any previous visit (from the past 240 hour(s)).   Studies: No results found.  Scheduled Meds: . antiseptic oral rinse  7 mL Mouth Rinse q12n4p  . atorvastatin  40 mg Oral  QPM  . chlorhexidine  15 mL Mouth Rinse BID  . dicyclomine  20 mg Oral QHS  . escitalopram  5 mg Oral Daily  . feeding supplement  1 Container Oral TID BM  . feeding supplement (ENSURE ENLIVE)  237 mL Oral BID BM  . folic acid  1 mg Oral Daily  . loperamide  2 mg Oral BID  . megestrol  400 mg Oral Daily  . mirtazapine  30 mg Oral QHS  . multivitamin with minerals  1 tablet Oral Daily  . sodium chloride  10-40 mL Intracatheter Q12H  . thiamine  100 mg Oral Daily   Continuous Infusions: . dextrose 5 % and 0.9 % NaCl with KCl 20 mEq/L 35 mL/hr at 11/09/14 0236    Principal Problem:   Primary colorectal adenocarcinoma Active Problems:   HTN (hypertension)   CAD (coronary artery disease)   Colonic mass   Chronic systolic CHF (congestive  heart failure), NYHA class 4   Hypokalemia   Malnutrition of moderate degree   Escherichia coli urinary tract infection   Microcytic anemia   Atherosclerotic peripheral vascular disease status post bilateral AKA's   Postoperative anemia due to acute blood loss   Hypophosphatemia   Abdominal aortic aneurysm   Intestinal occlusion    Time spent: 20 minutes    Frederica Kuster, PA-student     11/09/2014, 12:15 PM  LOS: 18 days       I have directly reviewed the clinical findings, lab results and imaging studies. I have interviewed and examined the patient and agree with the documentation and management as recorded by the Physician extender.  Keyonte Cookston M.D PhD on 01/10/2015 at 6:30 PM  [facility] Office  [phone]

## 2014-11-09 NOTE — Progress Notes (Signed)
Progress Note   Rodney [name] KGU:[id number] DOB: 03/13/1946 DOA: 10/22/2014 PCP: [name], NP   Brief Narrative:   Tremond [name] is an 69 y.o. male with a PMH of hypertension, alcohol abuse, PVD and CHF who was admitted 10/22/14 with chief complaint of abdominal pain. Workup in the ED included a CT of the abdomen and pelvis which showed a distal transverse colonic mass with high-grade stenosis concerning for malignancy. He initially refused surgical intervention and was felt to have capacity after a psychiatric evaluation. He ultimately consented to surgery and underwent a laparoscopy with right hemicolectomy, end ileostomy, mucous fistula and cholecystectomy on 10/27/14 for palliation. The patient is currently on TNA slow recovery of bowel function. Plan is to discharge him to a SNF when cleared by surgery.  Assessment/Plan:   Principal Problem:   Colorectal adenocarcinoma with widespread metastasis - Initial CT showed a transverse colonic mass with liver, spleen, and intra-abdominal metastasis. - S/P palliative laparoscopy with right hemicolectomy, end ileostomy, mucous fistula and cholecystectomy on 10/27/14. - Pathology consistent with colorectal adenocarcinoma. [name] need outpatient oncology follow-up once healed from surgery, although likely a poor candidate for treatment. - currently afebrile. Monitor wound and WBC. -off PPN, tolerating diet/wound/ostomy management per surgery -intake and output not accurately documented, will f/u surgery recommendations -tpn restarted and stopped by surgery, will follow surgery rec's.  Active Problems:   Abdominal aortic aneurysm - Noted incidentally on CT scan. 2.6 cm.    HTN (hypertension) - has been on coreg, bp low normal on 8/5, d/c coreg, close monitor bp -bp stable after off coreg    CAD (coronary artery disease), multivessel - Continue statin. Continue Coreg. - Resume aspirin when okay with surgery.    Chronic systolic CHF  (congestive heart failure), NYHA class 4 - Status post 2-D echo on 10/25/14: EF 50%. Hypokinesis of inferolateral wall and basal inferior segment noted. LVH. - LV function markedly improved over prior echocardiogram. Improvement attributed to abstaining from alcohol.    Chronic microcytic anemia / acute blood loss anemia - Likely from acute postoperative blood loss in the setting of chronic GI blood loss.  - Hemoglobin remained stable at 9.2 after receiving 1 unit of PRBCs 10/30/14.    Hypokalemia - Repleted.    Malnutrition of moderate degree/hypophosphatemia - Evaluated by dietitian 10/23/14. Continue supplements. - off TPN, advance diet - Monitor for refeeding syndrome. Phosphorus WNL . -increase megace, continue remeron    Escherichia coli urinary tract infection - Completed a course of therapy with Rocephin 7 days.    Peripheral vascular disease - Status post bilateral AKAs.  FTT: overall poor prognosis due to metastatic colorectal cancer, patient's has poor insight , patient does not seem to understand cancer diagnosis, psych has seen patient determined patient has capacity to make decision.     DVT Prophylaxis - Lovenox when okay with surgery. Unable to do SCDs given bilateral AKAs.  Family Communication: No family at the bedside. Declines my offer to call. Disposition Plan: SNF when ok with surgery Code Status:     Code Status Orders        Start     Ordered   10/22/14 2150  Full code   Continuous     10/22/14 2149        IV Access:    Peripheral IV   Procedures and diagnostic studies:   Ct Abdomen Pelvis W Contrast  10/22/2014   CLINICAL DATA:  69 year old male with right lower quadrant and  periumbilical pain. Emesis  EXAM: CT ABDOMEN AND PELVIS WITH CONTRAST  TECHNIQUE: Multidetector CT imaging of the abdomen and pelvis was performed using the standard protocol following bolus administration of intravenous contrast.  CONTRAST:  36mL OMNIPAQUE IOHEXOL 300  MG/ML SOLN, 136mL OMNIPAQUE IOHEXOL 300 MG/ML SOLN  COMPARISON:  CT dated 08/21/2013  FINDINGS: Partially visualized small right pleural profusion. Top-normal cardiac size. Right cardiophrenic angle top-normal lymph nodes. No intra-abdominal free air. Small ascites.  There are innumerable hepatic hypodense lesions, increased from prior study. Some lesion such as the 3.8 x 4.5 cm hypodense lesion in the right lobe the liver adjacent the right hepatic vein likely represents a hemangioma and others were previously characterized as cysts. Other lesions such as a 2.4 x 3.4 cm subcapsular lesion seen in the right hepatic lobe posteriorly (series 2, image 19) are not well characterized but suspicious for metastatic disease. There are nodular implants predominantly involving the surface of the right lobe of the liver with scalloping of the liver parenchyma compatible with subcapsular implants.  The gallbladder is not visualized, likely surgically absent. The pancreas is unremarkable. Surgical clips noted adjacent the head of the pancreas. Multiple hypodense lesions noted predominantly involving the surface of the spleen concerning for metastatic implant. Left adrenal thickening/hyperplasia. There are bilateral renal cortical irregularity is. There is apparent diffuse thickening of the bladder wall which may be partly related to underdistention. Cystitis is not excluded. Correlation with urinalysis recommended. The prostate and seminal vesicles are grossly unremarkable.  There is an ill-defined irregular nodular mass measuring approximately 3.5 x 2.3 cm involving the distal transverse colon most compatible with malignancy. There is high-grade stenosis of the bowel with dilatation of the colon proximal to this mass. There is no evidence of small bowel obstruction. Scattered sigmoid diverticula without active inflammation.  There is aortoiliac atherosclerotic disease. A 2.6 cm infrarenal abdominal aortic ectasia. There are  bilateral common iliac artery aneurysms measuring up to 1.7 cm (previously 1.2 cm). 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  There are nodular implants in the left lower abdomen with a combined dimension 1.9 x 4.7 cm. There is a 3.7 x 2.0 cm soft tissue implant is noted in the right lower quadrant adjacent the appendix. Multiple nodular implants are also noted adjacent to the tip of the appendix. Two 2 adjacent implants and free fluid extending from the subhepatic area. However, No definite evidence of acute appendicitis noted.  Multiple nodular densities anterior and inferior to the spleen compatible with metastatic implants.  Retroperitoneal/right periaortic adenopathy measures 1.3 cm in short axis.  Right anterior upper thigh lipoma noted with there is degenerative changes of the spine. No acute fracture.  IMPRESSION: Distal transverse colon obstructing mass most compatible with malignancy. Further evaluation with colonoscopy and tissue sampling recommended. There is high-grade stenosis and dilatation of the colon proximal to this mass.  Peritoneal, mesenteric, and hepatic metastatic disease.  No definite CT evidence of acute appendicitis.   Electronically Signed   By: Anner Crete M.D.   On: 10/22/2014 19:59   Dg Abd 2 Views  11/06/2014   CLINICAL DATA:  Post ileostomy and right hemicolectomy. Increased distention and abdominal pain.  EXAM: ABDOMEN - 2 VIEW  COMPARISON:  CT 10/22/2014  FINDINGS: Gaseous distension of bowel, predominantly small bowel with air-fluid levels. Cannot exclude small bowel obstruction.  No free air. Prior cholecystectomy.  IMPRESSION: Gaseous distention of bowel, predominantly small bowel. Cannot exclude small bowel obstruction.   Electronically Signed   By: Rolm Baptise M.D.   On: 11/06/2014 14:14     Medical  Consultants:    Psychiatry  General surgery  GI  Cardiology  Anti-Infectives:    Ceftriaxone 10/22/2014> 10/28/14  Subjective:   [name] Reported feeling better, say yes or no to questions, but otherwise not conversant much, poor insight about cancer diagnosis.  Denies ab pain, no documented vomiting, clear fluids in ostomy bag.   Objective:    Filed Vitals:   11/08/14 1436 11/08/14 2104 11/09/14 0626 11/09/14 1442  BP: 110/56 120/63 100/54 115/57  Pulse: 88 93 93 93  Temp: 98.5 F (36.9 C) 98.8 F (37.1 C) 99 F (37.2 C) 98.6 F (37 C)  TempSrc: Oral Oral Oral Oral  Resp: 16 16 18 18   Height:      Weight:      SpO2: 98% 98% 98% 99%    Intake/Output Summary (Last 24 hours) at 11/09/14 1810 Last data filed at 11/09/14 1736  Gross per 24 hour  Intake 4926.6 ml  Output   1120 ml  Net 3806.6 ml    Exam: Gen:  NAD, flat affect,  Cardiovascular:  RRR, II/VI SEM Respiratory:  Lungs CTAB Gastrointestinal:  Abdomen soft, +BS, ostomy draining, post op changes dressing intact, no visible drainage Extremities:  Bilateral amputee   Data Reviewed:    Labs: Basic Metabolic Panel:  Recent Labs Lab 11/04/14 0718 11/05/14 0549 11/06/14 0730 11/07/14 0413  NA 138 141 135 140  K 4.0 4.1 3.9 3.9  CL 110 112* 109 111  CO2 23 23 23  21*  GLUCOSE 95 103* 121* 130*  BUN 11 13 11 11   CREATININE 0.58* 0.66 0.65 0.60*  CALCIUM 7.3* 7.7* 7.9* 8.2*  MG  --   --   --  2.1  PHOS  --   --   --  3.1   GFR Estimated Creatinine Clearance: 60.6 mL/min (by C-G formula based on Cr of 0.6). Liver Function Tests:  Recent Labs Lab 11/04/14 0718 11/05/14 0549 11/07/14 0413  AST 164* 78* 69*  ALT 159* 107* 78*  ALKPHOS 311* 260* 240*  BILITOT 1.1 0.7 0.3  PROT 6.2* 6.4* 6.8  ALBUMIN 2.2* 1.9* 2.2*   CBC:  Recent Labs Lab 11/04/14 0718 11/05/14 0549 11/06/14 0730 11/07/14 0413  WBC 13.1* 13.6* 15.1* 13.7*  NEUTROABS  --   --   --  10.3*  HGB 8.7* 8.3*  8.7* 8.5*  HCT 26.9* 26.2* 26.6* 26.4*  MCV 79.6 80.4 79.9 80.0  PLT 311 369 435* 391   CBG:  Recent Labs Lab 11/07/14 2326 11/08/14 0733 11/08/14 1631 11/08/14 2359 11/09/14 0736  GLUCAP 106* 113* 118* 115* 118*   Microbiology No results found for this or any previous visit (from the past 240 hour(s)).   Medications:   . antiseptic oral rinse  7 mL Mouth Rinse q12n4p  . atorvastatin  40 mg Oral QPM  . chlorhexidine  15 mL Mouth Rinse BID  . dicyclomine  20 mg Oral QHS  . escitalopram  5 mg Oral Daily  . feeding supplement  1 Container Oral TID BM  . feeding supplement (ENSURE ENLIVE)  237 mL Oral BID BM  . folic acid  1 mg Oral Daily  . loperamide  2 mg Oral BID  . [START ON 11/10/2014] megestrol  800 mg Oral  Daily  . mirtazapine  30 mg Oral QHS  . multivitamin with minerals  1 tablet Oral Daily  . sodium chloride  10-40 mL Intracatheter Q12H  . thiamine  100 mg Oral Daily   Continuous Infusions: . dextrose 5 % and 0.9 % NaCl with KCl 20 mEq/L 35 mL/hr at 11/09/14 0236    Time spent: 25 minutes.   LOS: 18 days   Gimena Buick  [facility] Pager [phone].  *Please refer to [email or web address], password [email or web address] to get updated schedule on who will round on this patient, as hospitalists switch teams weekly. If 7PM-7AM, please contact night-coverage at [email or web address], password [email or web address] for any overnight needs.  11/09/2014, 6:10 PM

## 2014-11-10 DIAGNOSIS — K5660 Unspecified intestinal obstruction: Secondary | ICD-10-CM

## 2014-11-10 DIAGNOSIS — D509 Iron deficiency anemia, unspecified: Secondary | ICD-10-CM

## 2014-11-10 DIAGNOSIS — R1903 Right lower quadrant abdominal swelling, mass and lump: Secondary | ICD-10-CM | POA: Insufficient documentation

## 2014-11-10 DIAGNOSIS — C189 Malignant neoplasm of colon, unspecified: Secondary | ICD-10-CM

## 2014-11-10 LAB — CBC
HEMATOCRIT: 28.5 % — AB (ref 39.0–52.0)
Hemoglobin: 9.1 g/dL — ABNORMAL LOW (ref 13.0–17.0)
MCH: 25.3 pg — ABNORMAL LOW (ref 26.0–34.0)
MCHC: 31.9 g/dL (ref 30.0–36.0)
MCV: 79.2 fL (ref 78.0–100.0)
PLATELETS: 439 10*3/uL — AB (ref 150–400)
RBC: 3.6 MIL/uL — AB (ref 4.22–5.81)
RDW: 19.3 % — ABNORMAL HIGH (ref 11.5–15.5)
WBC: 12.8 10*3/uL — ABNORMAL HIGH (ref 4.0–10.5)

## 2014-11-10 MED ORDER — MEGESTROL ACETATE 400 MG/10ML PO SUSP: 800.0000 mg | Freq: Every day | ORAL | Status: DC

## 2014-11-10 MED ORDER — LOPERAMIDE HCL 2 MG PO CAPS: 2.0000 mg | ORAL_CAPSULE | Freq: Two times a day (BID) | ORAL | Status: DC

## 2014-11-10 MED ORDER — CARVEDILOL 6.25 MG PO TABS: 3.1250 mg | ORAL_TABLET | Freq: Two times a day (BID) | ORAL | Status: DC

## 2014-11-10 MED ORDER — ADULT MULTIVITAMIN W/MINERALS CH: 1.0000 | ORAL_TABLET | Freq: Every day | ORAL | Status: DC

## 2014-11-10 MED ORDER — PREDNISONE 5 MG PO TABS: 5.0000 mg | ORAL_TABLET | Freq: Every day | ORAL | Status: DC

## 2014-11-10 MED ORDER — FAMOTIDINE 20 MG PO TABS: 20.0000 mg | ORAL_TABLET | Freq: Every day | ORAL | Status: DC

## 2014-11-10 NOTE — Progress Notes (Signed)
Report called to [facility], spoke to [place], Therapist, sports.  Pt's PICC line removed by IV team.  Belongings packed.  [facility] to transport patient.    Pt stable for discharge.  Dsg changed this AM, Ileostomy pouch changed this AM--both C, D, and I at discharge.

## 2014-11-10 NOTE — Discharge Instructions (Signed)
[facility]      [facility] °[phone] ° °OPEN ABDOMINAL SURGERY: POST OP INSTRUCTIONS ° °Always review your discharge instruction sheet given to you by the facility where your surgery was performed. ° °IF YOU HAVE DISABILITY OR FAMILY LEAVE FORMS, YOU MUST BRING THEM TO THE OFFICE FOR PROCESSING.  PLEASE DO NOT GIVE THEM TO YOUR DOCTOR. ° °1. A prescription for pain medication may be given to you upon discharge.  Take your pain medication as prescribed, if needed.  If narcotic pain medicine is not needed, then you may take acetaminophen (Tylenol) or ibuprofen (Advil) as needed. °2. Take your usually prescribed medications unless otherwise directed. °3. If you need a refill on your pain medication, please contact your pharmacy. They will contact our office to request authorization.  Prescriptions will not be filled after 5pm or on week-ends. °4. You should follow a light diet the first few days after arrival home, such as soup and crackers, pudding, etc.unless your doctor has advised otherwise. A high-fiber, low fat diet can be resumed as tolerated.   Be sure to include lots of fluids daily. Most patients will experience some swelling and bruising on the chest and neck area.  Ice packs will help.  Swelling and bruising can take several days to resolve °5. Most patients will experience some swelling and bruising in the area of the incision. Ice pack will help. Swelling and bruising can take several days to resolve..  °6. It is common to experience some constipation if taking pain medication after surgery.  Increasing fluid intake and taking a stool softener will usually help or prevent this problem from occurring.  A mild laxative (Milk of Magnesia or Miralax) should be taken according to package directions if there are no bowel movements after 48 hours. °7.  You may have steri-strips (small skin tapes) in place directly over the incision.  These strips should be left on the skin for 7-10 days.  If your  surgeon used skin glue on the incision, you may shower in 24 hours.  The glue will flake off over the next 2-3 weeks.  Any sutures or staples will be removed at the office during your follow-up visit. You may find that a light gauze bandage over your incision may keep your staples from being rubbed or pulled. You may shower and replace the bandage daily. °8. ACTIVITIES:  You may resume regular (light) daily activities beginning the next day--such as daily self-care, walking, climbing stairs--gradually increasing activities as tolerated.  You may have sexual intercourse when it is comfortable.  Refrain from any heavy lifting or straining until approved by your doctor. °a. You may drive when you no longer are taking prescription pain medication, you can comfortably wear a seatbelt, and you can safely maneuver your car and apply brakes °b. Return to Work: ___________________________________ °9. You should see your doctor in the office for a follow-up appointment approximately two weeks after your surgery.  Make sure that you call for this appointment within a day or two after you arrive home to insure a convenient appointment time. °OTHER INSTRUCTIONS:  °_____________________________________________________________ °_____________________________________________________________ ° °WHEN TO CALL YOUR DOCTOR: °1. Fever over 101.0 °2. Inability to urinate °3. Nausea and/or vomiting °4. Extreme swelling or bruising °5. Continued bleeding from incision. °6. Increased pain, redness, or drainage from the incision. °7. Difficulty swallowing or breathing °8. Muscle cramping or spasms. °9. Numbness or tingling in hands or feet or around lips. ° °The clinic staff is available to   answer your questions during regular business hours.  Please dont hesitate to call and ask to speak to one of the nurses if you have concerns.  For further questions, please visit [email or web address]  Dressing Change to abdominal wound twice  a day A dressing is a material placed over wounds. It keeps the wound clean, dry, and protected from further injury.  BEFORE YOU BEGIN  Get your supplies together. Things you may need include:  Salt solution (saline).  Flexible gauze bandage.  Medicated cream.  Tape.  Gloves.  Belly (abdominal) pads.  Gauze squares.  Plastic bags.  Take pain medicine 30 minutes before the bandage change if you need it.  Take a shower before you do the first bandage change of the day. Put plastic wrap or a bag over the dressing. REMOVING YOUR OLD BANDAGE  Wash your hands with soap and water. Dry your hands with a clean towel.  Put on your gloves.  Remove any tape.  Remove the old bandage as told. If it sticks, put a small amount of warm water on it to loosen the bandage.  Remove any gauze or packing tape in your wound.  Take off your gloves.  Put the gloves, tape, gauze, or any packing tape in a plastic bag. CHANGING YOUR BANDAGE  Open the supplies.  Take the cap off the salt solution.  Open the gauze. Leave the gauze on the inside of the package.  Put on your gloves.  Clean your wound as told by your doctor.  Keep your wound dry if your doctor told you to do so.  Your doctor may tell you to do one or more of the following:  Pick up the gauze. Pour the salt solution over the gauze. Squeeze out the extra salt solution.  Put medicated cream or other medicine on your wound.  Put solution soaked gauze only in your wound, not on the skin around it.  Pack your wound loosely.  Put dry gauze on your wound.  Put belly pads over the dry gauze if your bandages soak through.  Tape the bandage in place so it will not fall off. Do not wrap the tape all the way around your arm or leg.  Wrap the bandage with the flexible gauze bandage as told by your doctor.  Take off your gloves. Put them in the plastic bag with the old bandage. Tie the bag shut and throw it away.  Keep the  bandage clean and dry.  Wash your hands. GET HELP RIGHT AWAY IF:   Your skin around the wound looks red.  Your wound feels more tender or sore.  You see yellowish-white fluid (pus) in the wound.  Your wound smells bad.  You have a fever.  Your skin around the wound has a red rash that itches and burns.  You see black or yellow skin in your wound that was not there before.  You feel sick to your stomach (nauseous), throw up (vomit), and feel very tired. Document Released: 06/16/2008 Document Revised: 08/04/2013 Document Reviewed: 01/29/2011 [facility] Patient Information 2015 [place], [place]. This information is not intended to replace advice given to you by your health care provider. Make sure you discuss any questions you have with your health care provider.

## 2014-11-10 NOTE — Progress Notes (Signed)
14 Days Post-Op  Subjective: His wound is cleaning up, sutures are still loose, but it looks much better than last week.  Issue seems to be intake and he just isn't taking in enough, he isn't hungry and it sounds like he has no family support.  Objective: Vital signs in last 24 hours: Temp:  [97.9 F (36.6 C)-98.6 F (37 C)] 97.9 F (36.6 C) (08/09 0549) Pulse Rate:  [93-105] 96 (08/09 0549) Resp:  [18-20] 20 (08/09 0549) BP: (109-115)/(57-63) 109/61 mmHg (08/09 0549) SpO2:  [98 %-99 %] 98 % (08/09 0549) Weight:  [47.628 kg (105 lb)] 47.628 kg (105 lb) (08/09 0549) Last BM Date: 11/09/14 50 PO recorded  Pt just not eating  Diet:  regular 500 stool recorded No nausea or vomiting Afebrile, VSS Last labs 11/07/14 Intake/Output from previous day: 08/08 0701 - 08/09 0700 In: 1080.5 [P.O.:50; I.V.:412.5; TPN:618] Out: 990 [Urine:490; Stool:500] Intake/Output this shift:    General appearance: alert, cooperative, no distress and depressed GI: soft, he isn't distended, he has a new ostomy bag and some fluid at ostomy, nothing in bag now.  +BS, wound is improving.  Lab Results:  No results for input(s): WBC, HGB, HCT, PLT in the last 72 hours.  BMET No results for input(s): NA, K, CL, CO2, GLUCOSE, BUN, CREATININE, CALCIUM in the last 72 hours. PT/INR No results for input(s): LABPROT, INR in the last 72 hours.   Recent Labs Lab 11/04/14 0718 11/05/14 0549 11/07/14 0413  AST 164* 78* 69*  ALT 159* 107* 78*  ALKPHOS 311* 260* 240*  BILITOT 1.1 0.7 0.3  PROT 6.2* 6.4* 6.8  ALBUMIN 2.2* 1.9* 2.2*     Lipase     Component Value Date/Time   LIPASE 30 10/22/2014 1707     Studies/Results: No results found.  Medications: . antiseptic oral rinse  7 mL Mouth Rinse q12n4p  . atorvastatin  40 mg Oral QPM  . chlorhexidine  15 mL Mouth Rinse BID  . dicyclomine  20 mg Oral QHS  . escitalopram  5 mg Oral Daily  . feeding supplement  1 Container Oral TID BM  . feeding  supplement (ENSURE ENLIVE)  237 mL Oral BID BM  . folic acid  1 mg Oral Daily  . loperamide  2 mg Oral BID  . megestrol  800 mg Oral Daily  . mirtazapine  30 mg Oral QHS  . multivitamin with minerals  1 tablet Oral Daily  . sodium chloride  10-40 mL Intracatheter Q12H  . thiamine  100 mg Oral Daily    Assessment/Plan Diagnostic laparoscopy, right hemicolectomy, end ileostomy, mucous fistula, cholecystectomy, 10/27/14, Dr. Barry Dienes POD 14 Post op ileus PVOD/bilateral AKA Hypertension Hx of tobacco ongoing use ETOH use Hx of CHF Anemia - being transfused 1 unit of PRBC now. Malnutrition TNA discontinued Antibiotics: 6 days of ceftriaxone, 2 doses of flagyl pre op. All currently discontinued DVT: None anemia/no heparin, bilateral LE Amputations/no SCD   Plan:  Surgically there is nothing currently left to do. Continue wet to dry dressing till this heals.   His issue is currently: intake, and output.      LOS: 19 days    Rodney [name] 11/10/2014

## 2014-11-10 NOTE — Clinical Social Work Placement (Signed)
   CLINICAL SOCIAL WORK PLACEMENT  NOTE  Date:  11/10/2014  Patient Details  Name: Rodney [name] MRN: [id number] Date of Birth: 10/09/1945  Clinical Social Work is seeking post-discharge placement for this patient at the [place] level of care (*CSW will initial, date and re-position this form in  chart as items are completed):  Yes   Patient/family provided with [facility] Work Department's list of facilities offering this level of care within the geographic area requested by the patient (or if unable, by the patient's family).  Yes   Patient/family informed of their freedom to choose among providers that offer the needed level of care, that participate in Medicare, Medicaid or managed care program needed by the patient, have an available bed and are willing to accept the patient.  Yes   Patient/family informed of [facility]'s ownership interest in [facility] and [facility], as well as of the fact that they are under no obligation to receive care at these facilities.  PASRR submitted to EDS on 10/28/14     PASRR number received on 10/28/14     Existing PASRR number confirmed on       FL2 transmitted to all facilities in geographic area requested by pt/family on 10/28/14     FL2 transmitted to all facilities within larger geographic area on       Patient informed that his/her managed care company has contracts with or will negotiate with certain facilities, including the following:        Yes   Patient/family informed of bed offers received.  Patient chooses bed at [facility]     Physician recommends and patient chooses bed at      Patient to be transferred to [facility] on 11/10/14.  Patient to be transferred to facility by ambulance [facility])     Patient family notified on 11/10/14 of transfer.  Name of family member notified:  pt notified at bedside and pt provided permission to notify pt cousin,  [name]     PHYSICIAN Please sign FL2     Additional Comment:    _______________________________________________ Ladell Pier, LCSW 11/10/2014, 1:53 PM

## 2014-11-10 NOTE — Progress Notes (Signed)
Pt for discharge to [facility].  CSW facilitated pt discharge needs including contacting facility, faxing pt discharge information via [facility], discussing with pt at bedside, pt provided permission for CSW to notify pt cousin, Edison Simon of pt discharge, [facility] contacted pt cousin, Edison Simon while in pt room and provided pt cousin with address for [facility], providing RN phone number to call report, and arranging ambulance transport for pt to [facility] and [place].  No further social work needs identified at this time.  CSW signing off.   Alison Murray, MSW, [place] [facility] [phone]

## 2014-11-20 ENCOUNTER — Other Ambulatory Visit: Payer: Self-pay | Admitting: *Deleted

## 2014-11-20 DIAGNOSIS — C19 Malignant neoplasm of rectosigmoid junction: Secondary | ICD-10-CM

## 2014-11-20 NOTE — Progress Notes (Signed)
Dr. Barry Dienes saw patient in clinic today and has requested medical oncology referral. Order placed.

## 2014-11-26 ENCOUNTER — Encounter: Payer: Self-pay | Admitting: Hematology

## 2014-11-26 ENCOUNTER — Encounter: Payer: Self-pay | Admitting: *Deleted

## 2014-11-26 ENCOUNTER — Other Ambulatory Visit: Payer: Self-pay

## 2014-11-26 ENCOUNTER — Ambulatory Visit: Payer: Medicare Other

## 2014-11-26 ENCOUNTER — Ambulatory Visit (HOSPITAL_BASED_OUTPATIENT_CLINIC_OR_DEPARTMENT_OTHER): Payer: Medicare Other | Admitting: Hematology

## 2014-11-26 VITALS — BP 105/55 | HR 78 | Resp 18

## 2014-11-26 DIAGNOSIS — R972 Elevated prostate specific antigen [PSA]: Secondary | ICD-10-CM | POA: Insufficient documentation

## 2014-11-26 DIAGNOSIS — C19 Malignant neoplasm of rectosigmoid junction: Secondary | ICD-10-CM

## 2014-11-26 NOTE — Progress Notes (Signed)
Checked in new pt with no financial concerns. °

## 2014-11-26 NOTE — Progress Notes (Signed)
Spoke with nurse, Suanne Marker at [facility] : She reports Rodney [name] is new to their facility-came from hospital. She says he does not eat but a few bites at a time and does vomit food back up frequently. Confirms his ostomy has output daily. He is a full code at present and is his own POA-they have no family listed in their records. She reports he was seen in hospital by Palliative Care Team and is supposed to be seen again next week (not a Hospice patient at this time).  Made her aware that note and next appointment have been faxed to her station and provided navigator contact information in case they need to communicate with Dr. Grier Mitts office.

## 2014-11-26 NOTE — Progress Notes (Signed)
Marland Kitchen    HEMATOLOGY/ONCOLOGY CONSULTATION NOTE  Date of Service: 11/26/2014  Patient Care Team: [name], NP as PCP - General (Nurse Practitioner)  CHIEF COMPLAINTS/PURPOSE OF CONSULTATION:  Newly diagnosed metastatic colorectal cancer in a patient with very poor performance status.  HISTORY OF PRESENTING ILLNESS:  Rodney [name] is a 69 y.o. male who has been referred to [place] by .[name], NP  for evaluation and management of newly diagnosed metastatic colon cancer.  Rodney [name] has multiple medical problems including hypertension, alcohol abuse, heavy smoker, peripheral vascular disease status post bilateral above-knee amputations, multivessel coronary artery disease with CHF, moderate aortic regurgitation lives at the nursing home [facility] where he receives all his cares. He has a history of significant depression and possibly some other psychiatric issues and perhaps alcohol related cognitive issues as well -his history on these are not completely available to me. He is currently on Lexapro.  He presented to the hospital on 10/22/2014 with right-sided abdominal pain which she notes was present for a few months prior to admission. He notes poor appetite and an unquantified degree of weight loss. He had a CT scan in the emergency room that showed a distal transverse colon obstructive mass with proximal bowel obstruction. He initially refused surgical intervention and had a psychiatry evaluation which apparently demonstrated decisional capacity for surgery. He ultimately underwent a laparoscopic right hemicolectomy, and ileostomy, cholecystectomy on 10/27/2014 for palliation of his bowel obstruction. He required alternative nutrition including IV nutrition. He was eventually discharged back to the skilled nursing facility after prolonged hospitalization on 11/10/2014.   He presents today for outpatient oncology follow-up to determine the appropriateness of palliative chemotherapeutic  treatments. In the clinic today he has a blunted affect and is minimally verbal and does not volunteer any answers readily. He chooses not to answer most questions . And provide some single word answers to questions that are repeated multiple times.  He is able to tell me that he knows he had abdominal surgery for some belly problems and notes that his pain is currently well-controlled know he has nausea and has been eating poorly. He cannot describe clearly that the pathology is cancer and what the status of his cancer is. When asked whether he wants to know more about his cancer he appears nonchalant and withdrawn. He chooses not to answer any feedback questions when explained the situation with his cancer .  After repeated questioning he seems to suggest that he would just like to be kept comfortable and is not keen on additional testing or chemotherapy or aggressive treatments . He has borderline decisional capacity that is not easy to assess with regards to the specific question of goals of care, agreement for additional workup or agreement to chemotherapy treatments .  He also cursorily mentions his cousin [place] as being involved to some extent with his cares and notes he has 2 sisters in [place] for not very involved . When questioned if he wants anybody to involved in his cares either friends or family he mentions that he did not want that . He was encouraged to think about what he would like to do . No additional family contact information was available from him or from his nursing home records.  MEDICAL HISTORY:  Past Medical History  Diagnosis Date  . HTN (hypertension)   . Smoking hx 03/25/2013  . Alcohol abuse 03/25/2013  . CHF (congestive heart failure) 03/25/2013  . Peripheral vascular disease   . Septic shock(785.52) 03/26/2013  .  Acute systolic CHF (congestive heart failure), NYHA class 4 03/28/2013    EF 15% global hypokinesis   . Osteomyelitis of left foot 01/22/2014   . Cellulitis of foot 01/22/2014  . Cardiomyopathy, ischemic 02/09/2014  . 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 Abdominal aortic aneurysm 11/01/2014    Moderate Aortic Regurgitation Metastatic Colon Cancer [id number] Elevated PSA  SURGICAL HISTORY: Past Surgical History  Procedure Laterality Date  . Above knee leg amputation Right   . Amputation Right 03/26/2013    Procedure: AMPUTATION ABOVE KNEE;  Surgeon: Rosetta Posner, MD;  Location: [facility];  Service: Vascular;  Laterality: Right;  . Amputation Left 02/06/2014    Procedure: LEFT ABOVE KNEE AMPUTATION;  Surgeon: Mal Misty, MD;  Location: [place];  Service: Vascular;  Laterality: Left;  . Left and right heart catheterization with coronary angiogram N/A 04/02/2013    Procedure: LEFT AND RIGHT HEART CATHETERIZATION WITH CORONARY ANGIOGRAM;  Surgeon: Larey Dresser, MD;  Location[facility] CATH LAB;  Service: Cardiovascular;  Laterality: N/A;  . Lower extremity angiogram Left 08/18/2013    Procedure: LOWER EXTREMITY ANGIOGRAM;  Surgeon: Conrad Udall, MD;  Location: [facility] CATH LAB;  Service: Cardiovascular;  Laterality: Left;  . Colon resection N/A 10/27/2014    Procedure: DIAGNOSTIC LAPAROSCOPY RIGHT HEMICOLECTOMY WITH MUCOUS FISTULA;  Surgeon: Stark Klein, MD;  Location: [facility];  Service: General;  Laterality: N/A;    SOCIAL HISTORY: Social History   Social History  . Marital Status: Single    Spouse Name: N/A  . Number of Children: N/A  . Years of Education: N/A   Occupational History  . Not on file.   Social History Main Topics  . Smoking status: Current Some Day Smoker    Types: Cigarettes  . Smokeless tobacco: Not on file  . Alcohol Use: No     Comment: everyday  . Drug Use: Yes    Special: Marijuana  . Sexual Activity: No   Other Topics Concern  . Not on file   Social History Narrative    FAMILY HISTORY: Family History  Problem Relation Age of Onset  . Heart disease      No  family history    ALLERGIES:  has No Known Allergies.  MEDICATIONS:  As documented in Epic  REVIEW OF SYSTEMS:    10 Point review of Systems was done is negative except as noted above.  PHYSICAL EXAMINATION: ECOG PERFORMANCE STATUS: 3 - Symptomatic, >50% confined to bed to 4. Difficult to assess since patient has bilateral AKA and his nonambulatory and moves around in a wheelchair .  Marland Kitchen Filed Vitals:   [employer]   .There is no weight on file to calculate BMI.  GENERALelderly fatigued-appearing gentleman in wheelchair with bilateral AKA SKIN: Pale-appearing no acute rashes EYES: Arcus senilis , normal EOM. OPHARYNX: No oral ulceration NECK: supple, mild JVD  LYMPH:  no palpable lymphadenopathy in the cervical, axillary or inguinal LUNGS: clear to auscultation with normal respiratory effort HEART: S1 and S2 regular, 2/6 diastolic murmur likely from AR ABDOMEN: Healing surgical scar in the midline, ostomy bag present skeletal: rt hand long thick uncut nails  PSYCH: flat affect, limited verbalization, very poor historian.  NEURO:  no overt focal motor deficits noted  LABORATORY DATA:  I have reviewed the data as listed  . CBC Latest Ref Rng 11/10/2014 11/07/2014 11/06/2014  WBC 4.0 - 10.5 K/uL 12.8(H) 13.7(H) 15.1(H)  Hemoglobin 13.0 - 17.0 g/dL 9.1(L)  8.5(L) 8.7(L)  Hematocrit 39.0 - 52.0 % 28.5(L) 26.4(L) 26.6(L)  Platelets 150 - 400 K/uL 439(H) 391 435(H)    . CMP Latest Ref Rng 11/07/2014 11/06/2014 11/05/2014  Glucose 65 - 99 mg/dL 130(H) 121(H) 103(H)  BUN 6 - 20 mg/dL 11 11 13   Creatinine 0.61 - 1.24 mg/dL 0.60(L) 0.65 0.66  Sodium 135 - 145 mmol/L 140 135 141  Potassium 3.5 - 5.1 mmol/L 3.9 3.9 4.1  Chloride 101 - 111 mmol/L 111 109 112(H)  CO2 22 - 32 mmol/L 21(L) 23 23  Calcium 8.9 - 10.3 mg/dL 8.2(L) 7.9(L) 7.7(L)  Total Protein 6.5 - 8.1 g/dL 6.8 - 6.4(L)  Total Bilirubin 0.3 - 1.2 mg/dL 0.3 - 0.7  Alkaline Phos 38 - 126 U/L 240(H) - 260(H)  AST 15 - 41 U/L  69(H) - 78(H)  ALT 17 - 63 U/L 78(H) - 107(H)     RADIOGRAPHIC STUDIES: I have personally reviewed the radiological images as listed and agreed with the findings in the report. Dg Abd 2 Views  11/06/2014   CLINICAL DATA:  Post ileostomy and right hemicolectomy. Increased distention and abdominal pain.  EXAM: ABDOMEN - 2 VIEW  COMPARISON:  CT 10/22/2014  FINDINGS: Gaseous distension of bowel, predominantly small bowel with air-fluid levels. Cannot exclude small bowel obstruction. No free air. Prior cholecystectomy.  IMPRESSION: Gaseous distention of bowel, predominantly small bowel. Cannot exclude small bowel obstruction.   Electronically Signed   By: Rolm Baptise M.D.   On: 11/06/2014 14:14    CT Abd/pelvis 10/22/2014:FINDINGS: Partially visualized small right pleural profusion. Top-normal cardiac size. Right cardiophrenic angle top-normal lymph nodes. No intra-abdominal free air. Small ascites.  There are innumerable hepatic hypodense lesions, increased from prior study. Some lesion such as the 3.8 x 4.5 cm hypodense lesion in the right lobe the liver adjacent the right hepatic vein likely represents a hemangioma and others were previously characterized as cysts. Other lesions such as a 2.4 x 3.4 cm subcapsular lesion seen in the right hepatic lobe posteriorly (series 2, image 19) are not well characterized but suspicious for metastatic disease. There are nodular implants predominantly involving the surface of the right lobe of the liver with scalloping of the liver parenchyma compatible with subcapsular implants.  The gallbladder is not visualized, likely surgically absent. The pancreas is unremarkable. Surgical clips noted adjacent the head of the pancreas. Multiple hypodense lesions noted predominantly involving the surface of the spleen concerning for metastatic implant. Left adrenal thickening/hyperplasia. There are bilateral renal cortical irregularity is. There is apparent  diffuse thickening of the bladder wall which may be partly related to underdistention. Cystitis is not excluded. Correlation with urinalysis recommended. The prostate and seminal vesicles are grossly unremarkable.  There is an ill-defined irregular nodular mass measuring approximately 3.5 x 2.3 cm involving the distal transverse colon most compatible with malignancy. There is high-grade stenosis of the bowel with dilatation of the colon proximal to this mass. There is no evidence of small bowel obstruction. Scattered sigmoid diverticula without active inflammation.  There is aortoiliac atherosclerotic disease. A 2.6 cm infrarenal abdominal aortic ectasia. 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  There are nodular implants in the left lower abdomen with a combined dimension 1.9 x 4.7 cm. There  is a 3.7 x 2.0 cm soft tissue implant is noted in the right lower quadrant adjacent the appendix. Multiple nodular implants are also noted adjacent to the tip of the appendix. Two 2 adjacent implants and free fluid extending from the subhepatic area. However, No definite evidence of acute appendicitis noted.  Multiple nodular densities anterior and inferior to the spleen compatible with metastatic implants.  Retroperitoneal/right periaortic adenopathy measures 1.3 cm in short axis.  Right anterior upper thigh lipoma noted with there is degenerative changes of the spine. No acute fracture.  IMPRESSION: Distal transverse colon obstructing mass most compatible with malignancy. Further evaluation with colonoscopy and tissue sampling recommended. There is high-grade stenosis and dilatation of the colon proximal to this  mass.  Peritoneal, mesenteric, and hepatic metastatic disease.  No definite CT evidence of acute appendicitis.   ASSESSMENT & PLAN:   Mrs. Hylton is a 69 year old African-American gentleman with significant medical issues, psychiatric issues and very poor performance status at baseline ECOG 3-4 and questionable decisional capacity now presenting with  #1 Newly diagnosed metastatic colon cancer (pTx,pN1c,cM1) stage IV status post palliative laparoscopic right hemicolectomy for bowel obstruction and cecal perforation on 7/26 2016. Presents for consideration of palliative chemotherapy. #2 poor nutritional status with protein calorie malnutrition #3 poor performance status ECOG 3-4 lives in a nursing home #4 likely multi-vessel coronary artery disease on dual antiplatelet therapy #5 severe peripheral vascular disease status post bilateral above-knee amputations #6 smoker #7 history of alcohol abuse likely with cognitive issues. #8 depression and likely other psychiatric issues Plan  -Patient recovering from surgery and is about a month out from surgery and is still healing. -Ileostomy working well. -Currently the patient's limited decisional capacity, poor functional status and medical comorbidities limit the advantage provided to him by palliative chemotherapy which would likely make him feel worse and might not provide any benefit from a quality of life standpoint. -We would recommend he be evaluated by psychiatry to determine decisional capacity specific for additional testing for cancer evaluation which would include a PET/CT scan and also to determine if he has decisional capacity to make a decision regarding palliative chemotherapy. -If patient does not have decisional capacity he would likely need family involvement or legal guardian to help with defining goals of care. -Would strongly recommend involvement of palliative care with likely transition to hospice as final decisions are  made. -Based on the patient's input of just wanting to be kept comfortable and with no inclination for additional bloodwork imaging or treatment at this time we will not add any other workup currently. -Patient could return back to clinic in 2 weeks to reassess his overall condition and goals of care . -Will need aggressive nutritional support at the nursing home.  I appreciate the privilege of taking care of Rodney [name]. Kindly let me know if there is anything we can help with.  I spent 40 minutes counseling the patient face to face. The total time spent in the appointment was 60 minutes and more than 50% was on counseling and direct patient cares.    Sullivan Lone MD [place] AAHIVMS [facility] Hematology/Oncology Physician [facility]  (Office):       [phone] (Work cell):  [phone] (Fax):           [phone]  11/26/2014 10:18 AM

## 2014-11-26 NOTE — Progress Notes (Signed)
Oncology Nurse Navigator Documentation  Oncology Nurse Navigator Flowsheets 11/26/2014  Referral date to RadOnc/MedOnc 11/20/2014  Navigator Encounter Type Initial MedOnc  Patient Visit Type Medonc-  Treatment Phase New patient referral  Barriers/Navigation Needs Family concerns;Education; nutrition; performance status  Education Understanding Cancer/ Treatment Options;  Interventions Office note to facility. Will speak with his nurse regarding his POA   Time Spent with Patient 45  Met with patient during new patient visit. Shows poor performance status and his understanding of his cancer and prognosis and potential treatment is questionable. He came alone w/records to show he is responsible party-he did/then did not want [place] to contact his cousin, Rodney [name]. Says he is not eating and vomits daily. [name] kept looking down at the floor, saying "my hands are cold" and "I just want to go to bed". Difficult to communicate with. Plan to return in 2 weeks. MD suggests psych referral/evaluation to determine his mental capacity to make treatment decisions and need for legal POA.

## 2014-12-03 DEATH — deceased

## 2014-12-10 ENCOUNTER — Ambulatory Visit: Payer: Self-pay | Admitting: Hematology

## 2014-12-28 IMAGING — CR DG CHEST 2V
1 series · 1 of 1 positions shown · non-contrast
Comparison: 03/29/2013 and 03/27/2013

CLINICAL DATA: COPD

EXAM:
CHEST  2 VIEW

[w chest lat]
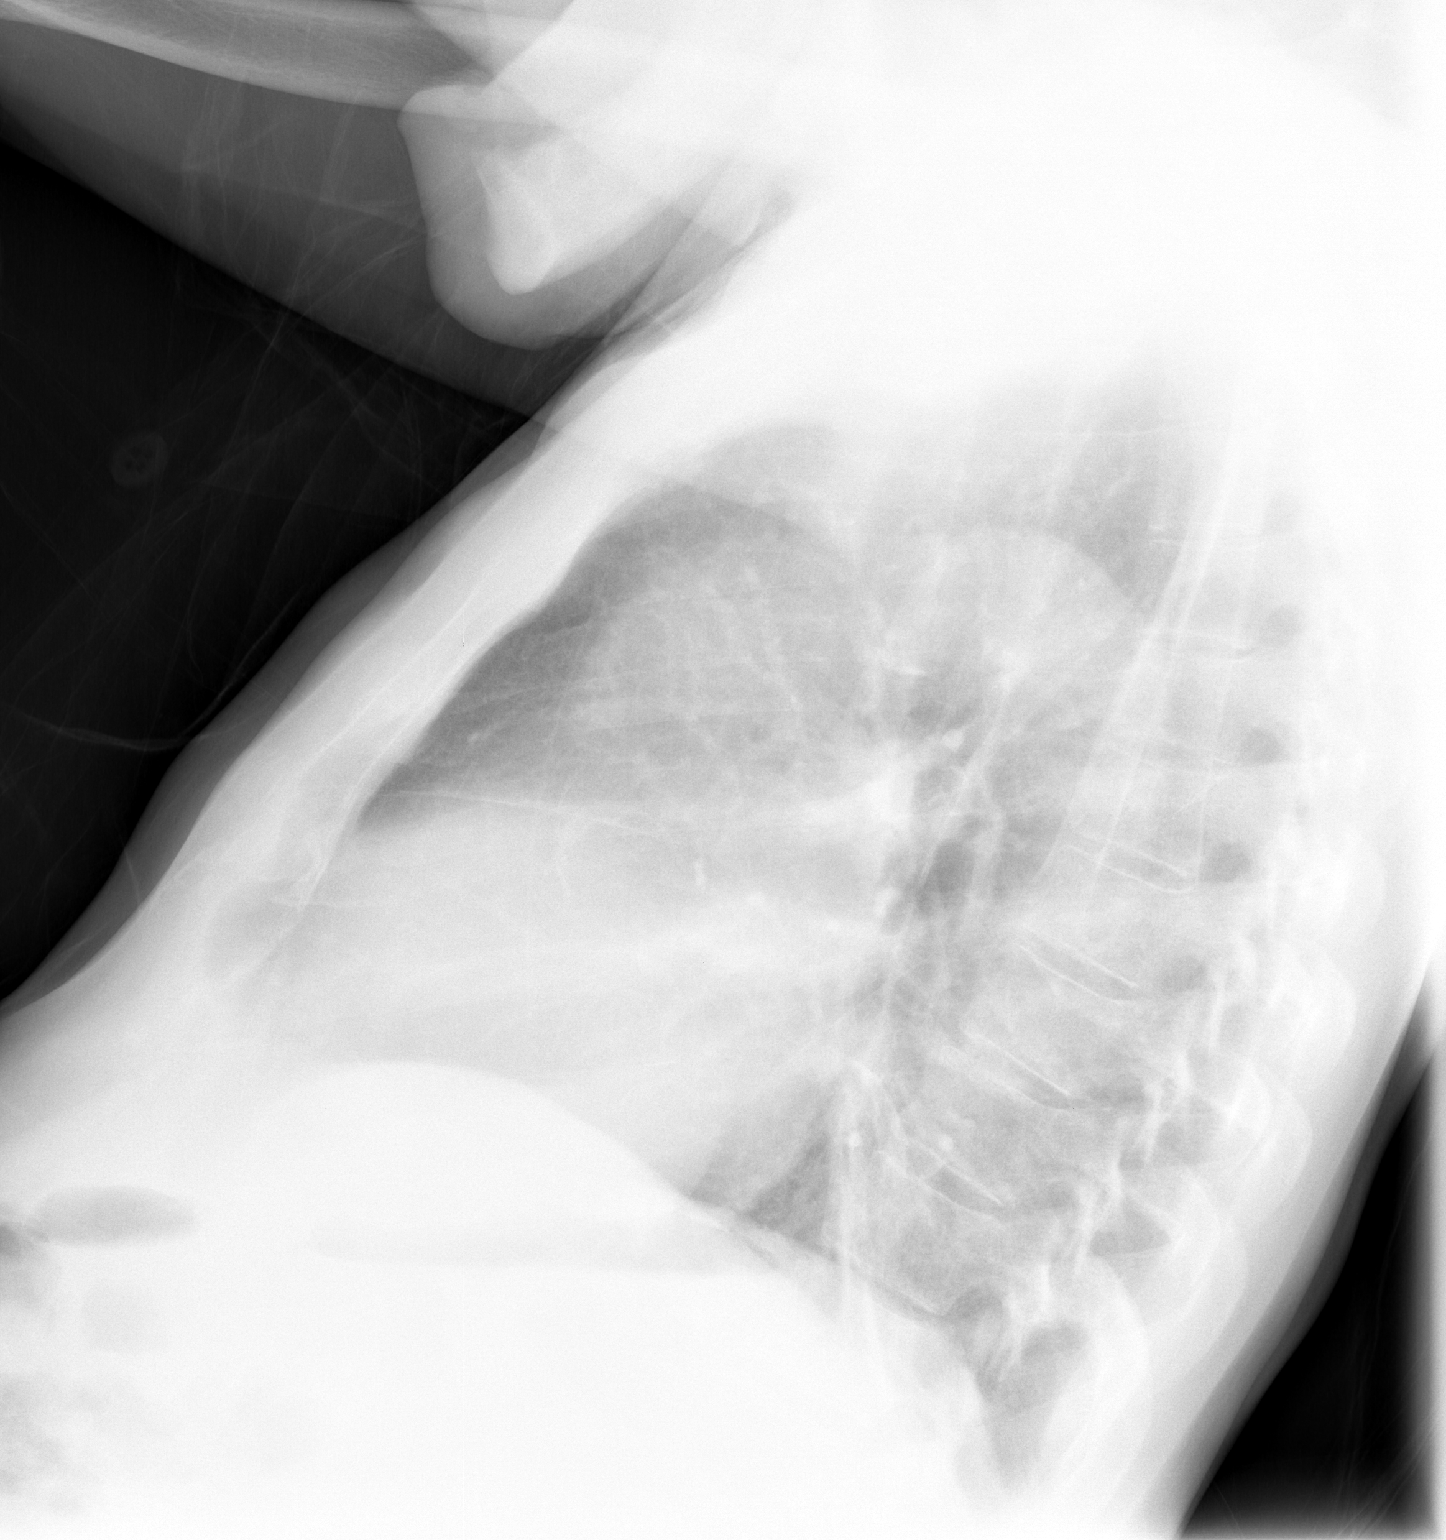

[1 of 1 positions shown; findings below may reference images not displayed]

FINDINGS: Cardiomegaly appears stable. Mediastinal and hilar contours are
normal and stable. Pulmonary vascularity is normal. Normal lung
volumes. Streaky atelectasis is seen posteriorly at the left lung
base. No airspace disease appreciated. Negative for pneumothorax. No
evidence of pleural effusion, but the posterior costophrenic angles
are partially excluded from the lateral view. No acute osseous
abnormality.
IMPRESSION: Cardiomegaly streaky atelectasis at the left lung base.

## 2015-10-10 IMAGING — CR DG ABDOMEN 2V
1 series · 1 of 1 positions shown · non-contrast
Comparison: CT 10/22/2014

CLINICAL DATA: Post ileostomy and right hemicolectomy. Increased
distention and abdominal pain.

EXAM:
ABDOMEN - 2 VIEW

[view not recorded]
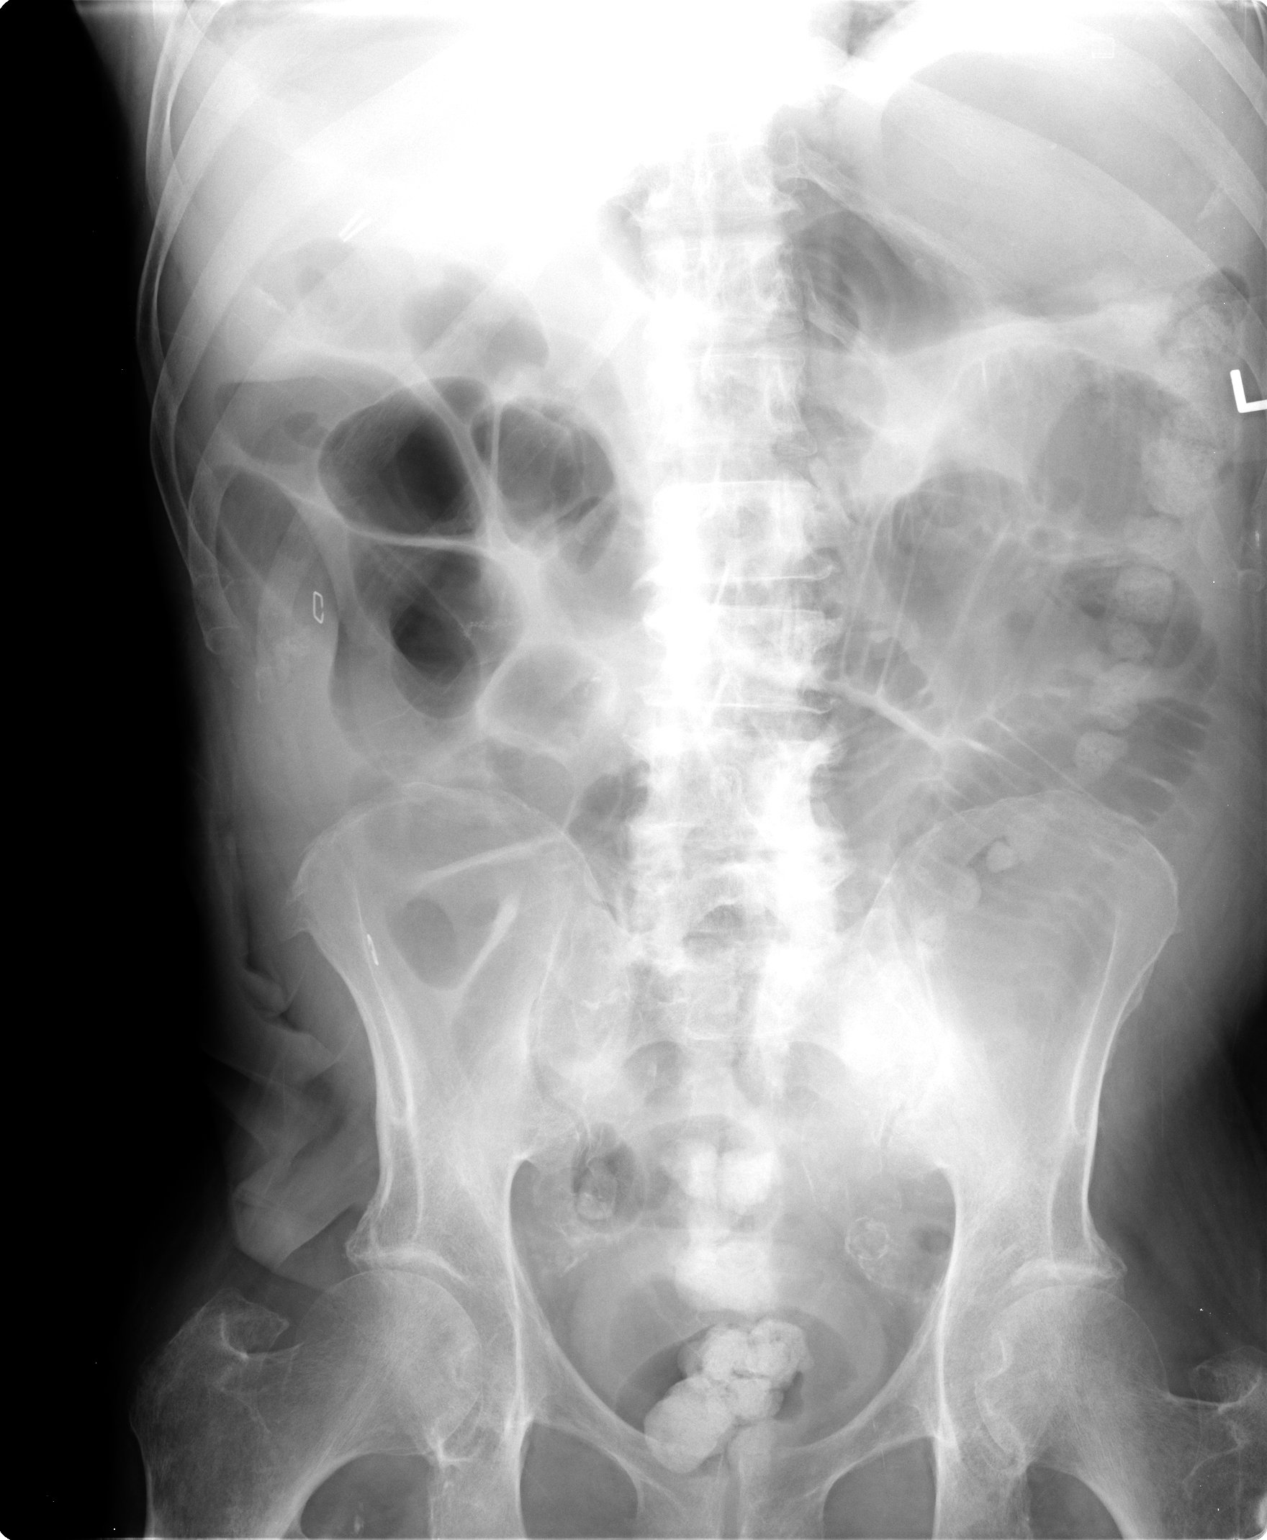

[1 of 1 positions shown; findings below may reference images not displayed]

FINDINGS: Gaseous distension of bowel, predominantly small bowel with
air-fluid levels. Cannot exclude small bowel obstruction. No free
air. Prior cholecystectomy.
IMPRESSION: Gaseous distention of bowel, predominantly small bowel. Cannot
exclude small bowel obstruction.
